# Patient Record
Sex: Male | Born: 1950
Health system: Southern US, Community
[De-identification: ages and names within clinical notes are randomized; demographics above are authoritative.]

## PROBLEM LIST (undated history)

## (undated) DIAGNOSIS — K5790 Diverticulosis of intestine, part unspecified, without perforation or abscess without bleeding: Secondary | ICD-10-CM

## (undated) DIAGNOSIS — I219 Acute myocardial infarction, unspecified: Secondary | ICD-10-CM

## (undated) DIAGNOSIS — N529 Male erectile dysfunction, unspecified: Secondary | ICD-10-CM

## (undated) DIAGNOSIS — R197 Diarrhea, unspecified: Secondary | ICD-10-CM

## (undated) DIAGNOSIS — D126 Benign neoplasm of colon, unspecified: Secondary | ICD-10-CM

## (undated) DIAGNOSIS — I2510 Atherosclerotic heart disease of native coronary artery without angina pectoris: Secondary | ICD-10-CM

## (undated) DIAGNOSIS — E119 Type 2 diabetes mellitus without complications: Secondary | ICD-10-CM

## (undated) DIAGNOSIS — I10 Essential (primary) hypertension: Secondary | ICD-10-CM

## (undated) DIAGNOSIS — E785 Hyperlipidemia, unspecified: Secondary | ICD-10-CM

## (undated) DIAGNOSIS — H269 Unspecified cataract: Secondary | ICD-10-CM

## (undated) DIAGNOSIS — K8020 Calculus of gallbladder without cholecystitis without obstruction: Secondary | ICD-10-CM

## (undated) HISTORY — DX: Diarrhea, unspecified: R19.7

## (undated) HISTORY — DX: Calculus of gallbladder without cholecystitis without obstruction: K80.20

## (undated) HISTORY — DX: Hyperlipidemia, unspecified: E78.5

## (undated) HISTORY — DX: Male erectile dysfunction, unspecified: N52.9

## (undated) HISTORY — PX: CORONARY ANGIOPLASTY WITH STENT PLACEMENT: SHX49

## (undated) HISTORY — DX: Acute myocardial infarction, unspecified: I21.9

## (undated) HISTORY — DX: Atherosclerotic heart disease of native coronary artery without angina pectoris: I25.10

## (undated) HISTORY — DX: Unspecified cataract: H26.9

## (undated) HISTORY — DX: Essential (primary) hypertension: I10

## (undated) HISTORY — DX: Benign neoplasm of colon, unspecified: D12.6

## (undated) HISTORY — DX: Diverticulosis of intestine, part unspecified, without perforation or abscess without bleeding: K57.90

---

## 2000-06-28 HISTORY — PX: ANGIOPLASTY: SHX39

## 2002-10-03 ENCOUNTER — Ambulatory Visit (HOSPITAL_BASED_OUTPATIENT_CLINIC_OR_DEPARTMENT_OTHER): Admission: RE | Admit: 2002-10-03 | Discharge: 2002-10-03 | Payer: Self-pay | Admitting: *Deleted

## 2007-06-29 HISTORY — PX: ROTATOR CUFF REPAIR: SHX139

## 2010-11-13 NOTE — Op Note (Signed)
   NAMEBALDWIN, RACICOT                          ACCOUNT NO.:  [id number]   MEDICAL RECORD NO.:  [id number]                   PATIENT TYPE:  AMB   LOCATION:  DSC                                  FACILITY:  [facility]   PHYSICIAN:  Lowell Bouton, M.D.      DATE OF BIRTH:  01/24/1951   DATE OF PROCEDURE:  10/03/2002  DATE OF DISCHARGE:                                 OPERATIVE REPORT   PREOPERATIVE DIAGNOSIS:  Trigger finger, right middle finger.   POSTOPERATIVE DIAGNOSIS:  Trigger finger, right middle finger.   OPERATION:  Release of A1 pulley right middle finger.   SURGEON:  Lowell Bouton, M.D.   ANESTHESIA:  0.5% Marcaine local with sedation.   OPERATIVE FINDINGS:  The patient had some nodular formation in the flexor  tendon beneath the A1 pulley.   DESCRIPTION OF PROCEDURE:  Under 0.5% Marcaine local anesthesia with a  tourniquet on the right arm, the right hand was prepped and draped in the  usual fashion and after exsanguinating the limb, the tourniquet was inflated  to 250 mmHg.  An oblique incision was made in line with the distal palmar  crease over the volar aspect of the MB joint of the middle finger.  Sharp  dissection was carried through the subcutaneous tissues.  Blunt dissection  was carried down to the flexor tendon sheath and care was taken to protect  the digital nerves.  In the midline a longitudinal incision was made through  the A1 pulley with a knife.  The remainder of the pulley was divided with  the scissors.  The finger was placed through a full range of motion and no  further triggering was encountered.  The wound was irrigated with saline.  The skin was closed with 4-0 Nylon sutures.  Sterile dressings were applied  and the tourniquet was released with good circulation to the hand.  The  patient went to the recovery room awake and stable in good condition.                                               Lowell Bouton,  M.D.    EMM/MEDQ  D:  10/03/2002  T:  10/04/2002  Job:  [id number]

## 2011-06-29 HISTORY — PX: TRIGGER FINGER RELEASE: SHX641

## 2012-06-28 HISTORY — PX: ROTATOR CUFF REPAIR: SHX139

## 2012-09-19 ENCOUNTER — Ambulatory Visit: Payer: BC Managed Care – PPO | Attending: Orthopaedic Surgery | Admitting: Rehabilitation

## 2012-09-19 DIAGNOSIS — M25519 Pain in unspecified shoulder: Secondary | ICD-10-CM | POA: Insufficient documentation

## 2012-09-19 DIAGNOSIS — IMO0001 Reserved for inherently not codable concepts without codable children: Secondary | ICD-10-CM | POA: Insufficient documentation

## 2012-09-26 ENCOUNTER — Ambulatory Visit: Payer: BC Managed Care – PPO | Attending: Orthopaedic Surgery | Admitting: Rehabilitation

## 2012-09-26 DIAGNOSIS — M25519 Pain in unspecified shoulder: Secondary | ICD-10-CM | POA: Insufficient documentation

## 2012-09-26 DIAGNOSIS — IMO0001 Reserved for inherently not codable concepts without codable children: Secondary | ICD-10-CM | POA: Insufficient documentation

## 2012-09-28 ENCOUNTER — Ambulatory Visit: Payer: BC Managed Care – PPO | Admitting: Rehabilitation

## 2012-10-03 ENCOUNTER — Ambulatory Visit: Payer: BC Managed Care – PPO | Admitting: Rehabilitation

## 2012-10-05 ENCOUNTER — Ambulatory Visit: Payer: BC Managed Care – PPO | Admitting: Rehabilitation

## 2012-10-10 ENCOUNTER — Ambulatory Visit: Payer: BC Managed Care – PPO | Admitting: Rehabilitation

## 2012-10-12 ENCOUNTER — Ambulatory Visit: Payer: BC Managed Care – PPO | Admitting: Rehabilitation

## 2012-10-17 ENCOUNTER — Ambulatory Visit: Payer: BC Managed Care – PPO | Admitting: Rehabilitation

## 2012-10-19 ENCOUNTER — Encounter: Payer: BC Managed Care – PPO | Admitting: Rehabilitation

## 2012-10-24 ENCOUNTER — Encounter: Payer: BC Managed Care – PPO | Admitting: Rehabilitation

## 2012-10-25 ENCOUNTER — Encounter: Payer: BC Managed Care – PPO | Admitting: Rehabilitation

## 2012-10-26 ENCOUNTER — Encounter: Payer: BC Managed Care – PPO | Admitting: Rehabilitation

## 2013-06-25 ENCOUNTER — Encounter: Payer: Self-pay | Admitting: Gastroenterology

## 2013-08-02 ENCOUNTER — Ambulatory Visit (AMBULATORY_SURGERY_CENTER): Payer: Self-pay | Admitting: *Deleted

## 2013-08-02 VITALS — Ht 64.0 in | Wt 177.4 lb

## 2013-08-02 DIAGNOSIS — Z1211 Encounter for screening for malignant neoplasm of colon: Secondary | ICD-10-CM

## 2013-08-02 MED ORDER — NA SULFATE-K SULFATE-MG SULF 17.5-3.13-1.6 GM/177ML PO SOLN: 1.0000 | Freq: Once | ORAL | Status: DC

## 2013-08-02 NOTE — Progress Notes (Signed)
No allergies to eggs or soy. No problems with anesthesia.  

## 2013-08-03 ENCOUNTER — Encounter: Payer: Self-pay | Admitting: Gastroenterology

## 2013-08-16 ENCOUNTER — Encounter: Payer: BC Managed Care – PPO | Admitting: Gastroenterology

## 2013-08-26 HISTORY — PX: COLONOSCOPY W/ POLYPECTOMY: SHX1380

## 2013-08-31 ENCOUNTER — Ambulatory Visit (AMBULATORY_SURGERY_CENTER): Payer: 59 | Admitting: Gastroenterology

## 2013-08-31 ENCOUNTER — Encounter: Payer: Self-pay | Admitting: Gastroenterology

## 2013-08-31 VITALS — BP 127/71 | HR 79 | Temp 98.0°F | Resp 16 | Ht 64.0 in | Wt 177.0 lb

## 2013-08-31 DIAGNOSIS — D126 Benign neoplasm of colon, unspecified: Secondary | ICD-10-CM

## 2013-08-31 DIAGNOSIS — K5730 Diverticulosis of large intestine without perforation or abscess without bleeding: Secondary | ICD-10-CM

## 2013-08-31 DIAGNOSIS — K648 Other hemorrhoids: Secondary | ICD-10-CM

## 2013-08-31 DIAGNOSIS — Z1211 Encounter for screening for malignant neoplasm of colon: Secondary | ICD-10-CM

## 2013-08-31 MED ORDER — SODIUM CHLORIDE 0.9 % IV SOLN: 500.0000 mL | INTRAVENOUS | Status: DC

## 2013-08-31 NOTE — Op Note (Signed)
[place]  [address] [place], [address]   COLONOSCOPY PROCEDURE REPORT  PATIENT: Troy Boyer, Troy Boyer  MR#: [id number] BIRTHDATE: 1950/07/27 , 62  yrs. old GENDER: Male ENDOSCOPIST: Inda Castle, MD REFERRED BY: PROCEDURE DATE:  08/31/2013 PROCEDURE:   Colonoscopy with snare polypectomy First Screening Colonoscopy - Avg.  risk and is 50 yrs.  old or older Yes.  Prior Negative Screening - Now for repeat screening. N/A  History of Adenoma - Now for follow-up colonoscopy & has been > or = to 3 yrs.  N/A  Polyps Removed Today? Yes. ASA CLASS:   Class II INDICATIONS:average risk screening. MEDICATIONS: MAC sedation, administered by CRNA and propofol (Diprivan) 250mg  IV  DESCRIPTION OF PROCEDURE:   After the risks benefits and alternatives of the procedure were thoroughly explained, informed consent was obtained.  A digital rectal exam revealed no abnormalities of the rectum.   The LB [id number]  endoscope was introduced through the anus and advanced to the cecum, which was identified by both the appendix and ileocecal valve. No adverse events experienced.   The quality of the prep was excellent using Suprep  The instrument was then slowly withdrawn as the colon was fully examined.      COLON FINDINGS: A sessile polyp measuring 5 mm in size was found in the ascending colon.  A polypectomy was performed with a cold snare.  The resection was complete and the polyp tissue was completely retrieved.   Moderate diverticulosis was noted in the sigmoid colon.   Mild diverticulosis was noted in the descending colon.   Internal hemorrhoids were found.  Retroflexed views revealed no abnormalities. The time to cecum=2 minutes 43 seconds. Withdrawal time=8 minutes 10 seconds.  The scope was withdrawn and the procedure completed. COMPLICATIONS: There were no complications.  ENDOSCOPIC IMPRESSION: 1.   Sessile polyp measuring 5 mm in size was found in the  ascending colon; polypectomy was performed with a cold snare 2.   Moderate diverticulosis was noted in the sigmoid colon 3.   Mild diverticulosis was noted in the descending colon 4.   Internal hemorrhoids  RECOMMENDATIONS: If the polyp(s) removed today are proven to be adenomatous (pre-cancerous) polyps, you will need a repeat colonoscopy in 5 years.  Otherwise you should continue to follow colorectal cancer screening guidelines for "routine risk" patients with colonoscopy in 10 years.  You will receive a letter within 1-2 weeks with the results of your biopsy as well as final recommendations.  Please call my office if you have not received a letter after 3 weeks.   eSigned:  Inda Castle, MD 08/31/2013 1:57 PM   cc:   PATIENT NAME:  Troy Boyer, Troy Boyer MR#: [id number]

## 2013-08-31 NOTE — Progress Notes (Signed)
Patient denies any allergies to eggs or soy. Patient denies any problems with anesthesia.  

## 2013-08-31 NOTE — Progress Notes (Signed)
Report to pacu rn, vss, bbs=clear 

## 2013-08-31 NOTE — Patient Instructions (Signed)
YOU HAD AN ENDOSCOPIC PROCEDURE TODAY AT THE [facility]: Refer to the procedure report that was given to you for any specific questions about what was found during the examination.  If the procedure report does not answer your questions, please call your gastroenterologist to clarify.  If you requested that your care partner not be given the details of your procedure findings, then the procedure report has been included in a sealed envelope for you to review at your convenience later.  YOU SHOULD EXPECT: Some feelings of bloating in the abdomen. Passage of more gas than usual.  Walking can help get rid of the air that was put into your GI tract during the procedure and reduce the bloating. If you had a lower endoscopy (such as a colonoscopy or flexible sigmoidoscopy) you may notice spotting of blood in your stool or on the toilet paper. If you underwent a bowel prep for your procedure, then you may not have a normal bowel movement for a few days.  DIET: Your first meal following the procedure should be a light meal and then it is ok to progress to your normal diet.  A half-sandwich or bowl of soup is an example of a good first meal.  Heavy or fried foods are harder to digest and may make you feel nauseous or bloated.  Likewise meals heavy in dairy and vegetables can cause extra gas to form and this can also increase the bloating.  Drink plenty of fluids but you should avoid alcoholic beverages for 24 hours.  ACTIVITY: Your care partner should take you home directly after the procedure.  You should plan to take it easy, moving slowly for the rest of the day.  You can resume normal activity the day after the procedure however you should NOT DRIVE or use heavy machinery for 24 hours (because of the sedation medicines used during the test).    SYMPTOMS TO REPORT IMMEDIATELY: A gastroenterologist can be reached at any hour.  During normal business hours, 8:30 AM to 5:00 PM Monday through Friday,  call [phone].  After hours and on weekends, please call the GI answering service at [phone] who will take a message and have the physician on call contact you.   Following lower endoscopy (colonoscopy or flexible sigmoidoscopy):  Excessive amounts of blood in the stool  Significant tenderness or worsening of abdominal pains  Swelling of the abdomen that is new, acute  Fever of 100F or higher  FOLLOW UP: If any biopsies were taken you will be contacted by phone or by letter within the next 1-3 weeks.  Call your gastroenterologist if you have not heard about the biopsies in 3 weeks.  Our staff will call the home number listed on your records the next business day following your procedure to check on you and address any questions or concerns that you may have at that time regarding the information given to you following your procedure. This is a courtesy call and so if there is no answer at the home number and we have not heard from you through the emergency physician on call, we will assume that you have returned to your regular daily activities without incident.  SIGNATURES/CONFIDENTIALITY: You and/or your care partner have signed paperwork which will be entered into your electronic medical record.  These signatures attest to the fact that that the information above on your After Visit Summary has been reviewed and is understood.  Full responsibility of the confidentiality of this   discharge information lies with you and/or your care-partner.  Recommendations See procedure report  

## 2013-08-31 NOTE — Progress Notes (Signed)
Called to room to assist during endoscopic procedure.  Patient ID and intended procedure confirmed with present staff. Received instructions for my participation in the procedure from the performing physician.  

## 2013-09-03 ENCOUNTER — Telehealth: Payer: Self-pay

## 2013-09-03 NOTE — Telephone Encounter (Signed)
  Follow up Call-  Call back number 08/31/2013  Post procedure Call Back phone  # [phone]  Permission to leave phone message Yes     Patient questions:  Do you have a fever, pain , or abdominal swelling? no Pain Score  0 *  Have you tolerated food without any problems? yes  Have you been able to return to your normal activities? yes  Do you have any questions about your discharge instructions: Diet   no Medications  no Follow up visit  no  Do you have questions or concerns about your Care? no  Actions: * If pain score is 4 or above: No action needed, pain <4.  Per the pt he said he did see a trace of bright, red blood in the toilet bowel Sunday evening.  I advised him if this does not stop on it's own within a couple of days to please call us back.  It it does stop no need to return the call.  Pt states he understands. Maw

## 2013-09-07 ENCOUNTER — Encounter: Payer: Self-pay | Admitting: Gastroenterology

## 2013-09-11 ENCOUNTER — Encounter: Payer: Self-pay | Admitting: *Deleted

## 2013-10-16 ENCOUNTER — Encounter: Payer: Self-pay | Admitting: Cardiology

## 2013-10-16 DIAGNOSIS — I2510 Atherosclerotic heart disease of native coronary artery without angina pectoris: Secondary | ICD-10-CM | POA: Insufficient documentation

## 2013-10-16 DIAGNOSIS — E782 Mixed hyperlipidemia: Secondary | ICD-10-CM | POA: Insufficient documentation

## 2013-10-16 DIAGNOSIS — I10 Essential (primary) hypertension: Secondary | ICD-10-CM | POA: Insufficient documentation

## 2013-10-16 DIAGNOSIS — E785 Hyperlipidemia, unspecified: Secondary | ICD-10-CM

## 2013-10-17 ENCOUNTER — Ambulatory Visit (INDEPENDENT_AMBULATORY_CARE_PROVIDER_SITE_OTHER): Payer: 59 | Admitting: Cardiology

## 2013-10-17 ENCOUNTER — Encounter: Payer: Self-pay | Admitting: Cardiology

## 2013-10-17 VITALS — BP 130/78 | HR 82 | Ht 64.0 in | Wt 175.0 lb

## 2013-10-17 DIAGNOSIS — I10 Essential (primary) hypertension: Secondary | ICD-10-CM

## 2013-10-17 DIAGNOSIS — I2510 Atherosclerotic heart disease of native coronary artery without angina pectoris: Secondary | ICD-10-CM

## 2013-10-17 DIAGNOSIS — Z720 Tobacco use: Secondary | ICD-10-CM | POA: Insufficient documentation

## 2013-10-17 DIAGNOSIS — F17200 Nicotine dependence, unspecified, uncomplicated: Secondary | ICD-10-CM

## 2013-10-17 DIAGNOSIS — E785 Hyperlipidemia, unspecified: Secondary | ICD-10-CM

## 2013-10-17 LAB — BASIC METABOLIC PANEL WITH GFR
BUN: 12 mg/dL (ref 6–23)
CO2: 24 meq/L (ref 19–32)
CREATININE: 0.88 mg/dL (ref 0.50–1.35)
Calcium: 9.4 mg/dL (ref 8.4–10.5)
Chloride: 103 mEq/L (ref 96–112)
GFR, Est African American: >89 mL/min
GFR, Est Non African American: >89 mL/min
GLUCOSE: 114 mg/dL — AB (ref 70–99)
Potassium: 4.4 mEq/L (ref 3.5–5.3)
Sodium: 137 mEq/L (ref 135–145)

## 2013-10-17 LAB — LIPID PANEL
CHOLESTEROL: 167 mg/dL (ref 0–200)
HDL: 34 mg/dL — ABNORMAL LOW (ref >39)
LDL CALC: 76 mg/dL (ref 0–99)
Total CHOL/HDL Ratio: 4.9 Ratio
Triglycerides: 284 mg/dL — ABNORMAL HIGH (ref <150)
VLDL: 57 mg/dL — ABNORMAL HIGH (ref 0–40)

## 2013-10-17 LAB — HEPATIC FUNCTION PANEL
ALK PHOS: 37 U/L — AB (ref 39–117)
ALT: 25 U/L (ref 0–53)
AST: 21 U/L (ref 0–37)
Albumin: 4.4 g/dL (ref 3.5–5.2)
BILIRUBIN DIRECT: 0.1 mg/dL (ref 0.0–0.3)
BILIRUBIN INDIRECT: 0.4 mg/dL (ref 0.2–1.2)
Total Bilirubin: 0.5 mg/dL (ref 0.2–1.2)
Total Protein: 6.6 g/dL (ref 6.0–8.3)

## 2013-10-17 NOTE — Assessment & Plan Note (Signed)
Patient counseled on discontinuing. 

## 2013-10-17 NOTE — Progress Notes (Signed)
HPI: 63 year old male for evaluation of coronary artery disease. Previously followed in [facility]. Patient had a non-ST elevation myocardial infarction in 2002. Cardiac catheterization revealed an 80% proximal right coronary artery followed by an 85%. There was a 90% circumflex. LV function normal. Patient had PCI of his circumflex and right coronary artery. Nuclear study in July of 2012 showed an ejection fraction of 76%. No ischemia. Patient denies dyspnea, chest pain, palpitations, syncope or claudication.  Current Outpatient Prescriptions  Medication Sig Dispense Refill  . amLODipine (NORVASC) 5 MG tablet Take 5 mg by mouth daily.      Marland Kitchen aspirin 81 MG tablet Take 81 mg by mouth daily.      Marland Kitchen atorvastatin (LIPITOR) 40 MG tablet Take 40 mg by mouth daily.      . fenofibrate 160 MG tablet Take 160 mg by mouth daily.      . metoprolol tartrate (LOPRESSOR) 25 MG tablet Take 25 mg by mouth daily.      . Multiple Vitamin (MULTIVITAMIN) tablet Take 1 tablet by mouth daily.      . NON FORMULARY MEGARED  1 TAB DAILY      . ramipril (ALTACE) 10 MG capsule Take 10 mg by mouth daily.      . Sildenafil Citrate (VIAGRA PO) Take by mouth as needed.       No current facility-administered medications for this visit.    No Known Allergies  Past Medical History  Diagnosis Date  . Hyperlipidemia   . Hypertension   . CAD (coronary artery disease)     Past Surgical History  Procedure Laterality Date  . Angioplasty  2002    2 stents  . Rotator cuff repair Right 2009  . Trigger finger release  2013    4 fingers  . Rotator cuff repair Left 2014    History   Social History  . Marital Status: Significant Other    Spouse Name: N/A    Number of Children: 4  . Years of Education: N/A   Occupational History  .     Social History Main Topics  . Smoking status: Current Every Day Smoker -- 1.00 packs/day    Types: Cigarettes  . Smokeless tobacco: Never Used  . Alcohol Use: 4.2 oz/week      7 Glasses of wine per week  . Drug Use: No  . Sexual Activity: Not on file   Other Topics Concern  . Not on file   Social History Narrative  . No narrative on file    Family History  Problem Relation Age of Onset  . Colon cancer Neg Hx   . CAD Brother   . CAD Sister   . Heart disease Father     Valve replacement and CAD    ROS: no fevers or chills, productive cough, hemoptysis, dysphasia, odynophagia, melena, hematochezia, dysuria, hematuria, rash, seizure activity, orthopnea, PND, pedal edema, claudication. Remaining systems are negative.  Physical Exam:   Blood pressure 130/78, pulse 82, height 5\' 4"  (1.626 m), weight 175 lb (79.379 kg).  General:  Well developed/well nourished in NAD Skin warm/dry Patient not depressed No peripheral clubbing Back-normal HEENT-normal/normal eyelids Neck supple/normal carotid upstroke bilaterally; no bruits; no JVD; no thyromegaly chest - CTA/ normal expansion CV - RRR/normal S1 and S2; no murmurs, rubs or gallops;  PMI nondisplaced Abdomen -NT/ND, no HSM, no mass, + bowel sounds, no bruit 2+ femoral pulses, no bruits Ext-no edema, chords, 2+ DP Neuro-grossly nonfocal  ECG  Normal sinus rhythm, left axis deviation.

## 2013-10-17 NOTE — Assessment & Plan Note (Signed)
Continue statin. Check lipids and liver. I will arrange for him to have a primary care physician for noncardiac issues.

## 2013-10-17 NOTE — Assessment & Plan Note (Signed)
Blood pressure controlled. Continue present medications. Check potassium and renal function. 

## 2013-10-17 NOTE — Assessment & Plan Note (Signed)
Continue aspirin and statin. 

## 2013-10-17 NOTE — Patient Instructions (Signed)
Your physician wants you to follow-up in: ONE YEAR WITH DR CRENSHAW You will receive a reminder letter in the mail two months in advance. If you don't receive a letter, please call our office to schedule the follow-up appointment.   Your physician recommends that you HAVE LAB WORK TODAY 

## 2013-12-24 ENCOUNTER — Other Ambulatory Visit: Payer: Self-pay

## 2013-12-24 MED ORDER — ATORVASTATIN CALCIUM 40 MG PO TABS: 40.0000 mg | ORAL_TABLET | Freq: Every day | ORAL | Status: DC

## 2014-01-30 ENCOUNTER — Other Ambulatory Visit: Payer: Self-pay | Admitting: *Deleted

## 2014-01-30 MED ORDER — RAMIPRIL 10 MG PO CAPS: 10.0000 mg | ORAL_CAPSULE | Freq: Every day | ORAL | Status: DC

## 2014-01-30 MED ORDER — AMLODIPINE BESYLATE 5 MG PO TABS: 5.0000 mg | ORAL_TABLET | Freq: Every day | ORAL | Status: DC

## 2014-01-30 MED ORDER — METOPROLOL TARTRATE 25 MG PO TABS: 25.0000 mg | ORAL_TABLET | Freq: Every day | ORAL | Status: DC

## 2014-02-05 ENCOUNTER — Other Ambulatory Visit: Payer: Self-pay | Admitting: Cardiology

## 2014-02-06 ENCOUNTER — Other Ambulatory Visit: Payer: Self-pay | Admitting: *Deleted

## 2014-02-06 NOTE — Telephone Encounter (Signed)
Spoke to patient and he stated that he has been taking this bid for at least 7 years and that it was probably put into our system incorrectly when he was seen.

## 2014-02-06 NOTE — Telephone Encounter (Signed)
Should this be qd(which is on the chart), or bid(which is what was requested by pharmacy)? Please advise. Thanks, MI

## 2014-02-06 NOTE — Telephone Encounter (Signed)
Looks like it should be 10 mg once daily but would have to call the patient to discuss and confirm.

## 2014-03-22 ENCOUNTER — Other Ambulatory Visit: Payer: Self-pay | Admitting: Cardiology

## 2014-03-25 ENCOUNTER — Other Ambulatory Visit: Payer: Self-pay | Admitting: Cardiology

## 2014-07-30 ENCOUNTER — Other Ambulatory Visit: Payer: Self-pay | Admitting: Cardiology

## 2014-07-31 ENCOUNTER — Other Ambulatory Visit: Payer: Self-pay | Admitting: Cardiology

## 2014-07-31 NOTE — Telephone Encounter (Signed)
Rx has been sent to the pharmacy electronically. ° °

## 2014-08-12 ENCOUNTER — Other Ambulatory Visit: Payer: Self-pay | Admitting: Cardiology

## 2014-08-14 ENCOUNTER — Other Ambulatory Visit: Payer: Self-pay | Admitting: Cardiology

## 2014-08-14 NOTE — Telephone Encounter (Signed)
Rx has been sent to the pharmacy electronically. ° °

## 2014-08-15 ENCOUNTER — Other Ambulatory Visit: Payer: Self-pay | Admitting: *Deleted

## 2014-08-15 ENCOUNTER — Other Ambulatory Visit: Payer: Self-pay

## 2014-08-15 MED ORDER — METOPROLOL TARTRATE 25 MG PO TABS: 25.0000 mg | ORAL_TABLET | Freq: Every day | ORAL | Status: DC

## 2014-08-15 MED ORDER — RAMIPRIL 10 MG PO CAPS
10.0000 mg | ORAL_CAPSULE | Freq: Two times a day (BID) | ORAL | Status: DC
Start: 2014-08-15 — End: 2015-03-11

## 2014-09-16 NOTE — Progress Notes (Signed)
      HPI: FU coronary artery disease. Previously followed in [facility]. Patient had a non-ST elevation myocardial infarction in 2002. Cardiac catheterization revealed an 80% proximal right coronary artery followed by an 85%. There was a 90% circumflex. LV function normal. Patient had PCI of his circumflex and right coronary artery. Nuclear study in July of 2012 showed an ejection fraction of 76%. No ischemia. Since last seen, the patient denies any dyspnea on exertion, orthopnea, PND, pedal edema, palpitations, syncope or chest pain.   Current Outpatient Prescriptions  Medication Sig Dispense Refill  . amLODipine (NORVASC) 5 MG tablet TAKE 1 TABLET BY MOUTH DAILY 90 tablet 0  . aspirin 81 MG tablet Take 81 mg by mouth daily.    Marland Kitchen atorvastatin (LIPITOR) 40 MG tablet Take 1 tablet (40 mg total) by mouth daily. 90 tablet 3  . fenofibrate 160 MG tablet TAKE 1 TABLET BY MOUTH EVERY DAY 90 tablet 1  . KRILL OIL PO Take by mouth daily.    . metoprolol tartrate (LOPRESSOR) 25 MG tablet Take 1 tablet (25 mg total) by mouth daily. 90 tablet 0  . Multiple Vitamin (MULTIVITAMIN) tablet Take 1 tablet by mouth daily.    . nitroGLYCERIN (NITROSTAT) 0.4 MG SL tablet Place 0.4 mg under the tongue every 5 (five) minutes as needed for chest pain.    . ramipril (ALTACE) 10 MG capsule Take 1 capsule (10 mg total) by mouth 2 (two) times daily. 60 capsule 3  . Sildenafil Citrate (VIAGRA PO) Take by mouth as needed.     No current facility-administered medications for this visit.     Past Medical History  Diagnosis Date  . Hyperlipidemia   . Hypertension   . CAD (coronary artery disease)     Past Surgical History  Procedure Laterality Date  . Angioplasty  2002    2 stents  . Rotator cuff repair Right 2009  . Trigger finger release  2013    4 fingers  . Rotator cuff repair Left 2014    History   Social History  . Marital Status: Significant Other    Spouse Name: N/A  . Number of Children: 4    . Years of Education: N/A   Occupational History  .     Social History Main Topics  . Smoking status: Current Every Day Smoker -- 1.00 packs/day    Types: Cigarettes  . Smokeless tobacco: Never Used  . Alcohol Use: 4.2 oz/week    7 Glasses of wine per week  . Drug Use: No  . Sexual Activity: Not on file   Other Topics Concern  . Not on file   Social History Narrative    ROS: no fevers or chills, productive cough, hemoptysis, dysphasia, odynophagia, melena, hematochezia, dysuria, hematuria, rash, seizure activity, orthopnea, PND, pedal edema, claudication. Remaining systems are negative.  Physical Exam: Well-developed well-nourished in no acute distress.  Skin is warm and dry.  HEENT is normal.  Neck is supple.  Chest is clear to auscultation with normal expansion.  Cardiovascular exam is regular rate and rhythm.  Abdominal exam nontender or distended. No masses palpated. Extremities show no edema. neuro grossly intact  ECG sinus rhythm at a rate of 79. Left axis deviation.

## 2014-09-18 ENCOUNTER — Ambulatory Visit (INDEPENDENT_AMBULATORY_CARE_PROVIDER_SITE_OTHER): Payer: PRIVATE HEALTH INSURANCE | Admitting: Cardiology

## 2014-09-18 ENCOUNTER — Encounter: Payer: Self-pay | Admitting: Cardiology

## 2014-09-18 VITALS — BP 124/82 | HR 79 | Ht 64.0 in | Wt 175.1 lb

## 2014-09-18 DIAGNOSIS — I2510 Atherosclerotic heart disease of native coronary artery without angina pectoris: Secondary | ICD-10-CM | POA: Diagnosis not present

## 2014-09-18 DIAGNOSIS — I10 Essential (primary) hypertension: Secondary | ICD-10-CM

## 2014-09-18 DIAGNOSIS — Z720 Tobacco use: Secondary | ICD-10-CM | POA: Diagnosis not present

## 2014-09-18 DIAGNOSIS — E785 Hyperlipidemia, unspecified: Secondary | ICD-10-CM | POA: Diagnosis not present

## 2014-09-18 LAB — HEPATIC FUNCTION PANEL
ALK PHOS: 33 U/L — AB (ref 39–117)
ALT: 23 U/L (ref 0–53)
AST: 22 U/L (ref 0–37)
Albumin: 4.5 g/dL (ref 3.5–5.2)
BILIRUBIN TOTAL: 0.6 mg/dL (ref 0.2–1.2)
Bilirubin, Direct: 0.1 mg/dL (ref 0.0–0.3)
Indirect Bilirubin: 0.5 mg/dL (ref 0.2–1.2)
Total Protein: 6.6 g/dL (ref 6.0–8.3)

## 2014-09-18 LAB — BASIC METABOLIC PANEL WITH GFR
BUN: 13 mg/dL (ref 6–23)
CO2: 22 meq/L (ref 19–32)
Calcium: 10 mg/dL (ref 8.4–10.5)
Chloride: 105 mEq/L (ref 96–112)
Creat: 0.9 mg/dL (ref 0.50–1.35)
GFR, Est African American: >89 mL/min
GFR, Est Non African American: >89 mL/min
GLUCOSE: 122 mg/dL — AB (ref 70–99)
POTASSIUM: 4.3 meq/L (ref 3.5–5.3)
Sodium: 141 mEq/L (ref 135–145)

## 2014-09-18 LAB — LIPID PANEL
Cholesterol: 168 mg/dL (ref 0–200)
HDL: 32 mg/dL — ABNORMAL LOW (ref >=40)
LDL Cholesterol: 64 mg/dL (ref 0–99)
TRIGLYCERIDES: 362 mg/dL — AB (ref <150)
Total CHOL/HDL Ratio: 5.3 Ratio
VLDL: 72 mg/dL — ABNORMAL HIGH (ref 0–40)

## 2014-09-18 MED ORDER — NITROGLYCERIN 0.4 MG SL SUBL: 0.4000 mg | SUBLINGUAL_TABLET | SUBLINGUAL | Status: DC | PRN

## 2014-09-18 NOTE — Patient Instructions (Signed)
Your physician wants you to follow-up in: ONE YEAR WITH DR CRENSHAW You will receive a reminder letter in the mail two months in advance. If you don't receive a letter, please call our office to schedule the follow-up appointment.   Your physician recommends that you HAVE LAB WORK TODAY 

## 2014-09-18 NOTE — Assessment & Plan Note (Signed)
Patient counseled on discontinuing. 

## 2014-09-18 NOTE — Assessment & Plan Note (Signed)
Continue statin. Check lipids and liver. 

## 2014-09-18 NOTE — Assessment & Plan Note (Signed)
Continue aspirin and statin. I have recommended a functional study. He would like to wait towards the end of the year when his deductible is met.

## 2014-09-18 NOTE — Assessment & Plan Note (Signed)
Blood pressure controlled. Continue present medications. 

## 2014-10-17 ENCOUNTER — Other Ambulatory Visit: Payer: Self-pay | Admitting: Cardiology

## 2014-10-21 ENCOUNTER — Encounter: Payer: Self-pay | Admitting: Family

## 2014-10-21 ENCOUNTER — Other Ambulatory Visit: Payer: Self-pay | Admitting: Family

## 2014-10-21 ENCOUNTER — Ambulatory Visit (INDEPENDENT_AMBULATORY_CARE_PROVIDER_SITE_OTHER): Payer: PRIVATE HEALTH INSURANCE | Admitting: Family

## 2014-10-21 ENCOUNTER — Other Ambulatory Visit (INDEPENDENT_AMBULATORY_CARE_PROVIDER_SITE_OTHER): Payer: PRIVATE HEALTH INSURANCE

## 2014-10-21 VITALS — BP 138/92 | HR 72 | Temp 98.0°F | Resp 18 | Ht 64.0 in | Wt 172.8 lb

## 2014-10-21 DIAGNOSIS — R7301 Impaired fasting glucose: Secondary | ICD-10-CM | POA: Diagnosis not present

## 2014-10-21 DIAGNOSIS — H938X9 Other specified disorders of ear, unspecified ear: Secondary | ICD-10-CM | POA: Insufficient documentation

## 2014-10-21 DIAGNOSIS — E119 Type 2 diabetes mellitus without complications: Secondary | ICD-10-CM | POA: Insufficient documentation

## 2014-10-21 DIAGNOSIS — E1165 Type 2 diabetes mellitus with hyperglycemia: Secondary | ICD-10-CM | POA: Insufficient documentation

## 2014-10-21 DIAGNOSIS — H938X2 Other specified disorders of left ear: Secondary | ICD-10-CM

## 2014-10-21 LAB — HEMOGLOBIN A1C: HEMOGLOBIN A1C: 6.7 % — AB (ref 4.6–6.5)

## 2014-10-21 MED ORDER — METFORMIN HCL 500 MG PO TABS: 500.0000 mg | ORAL_TABLET | Freq: Two times a day (BID) | ORAL | Status: DC

## 2014-10-21 NOTE — Progress Notes (Signed)
Pre visit review using our clinic review tool, if applicable. No additional management support is needed unless otherwise documented below in the visit note. 

## 2014-10-21 NOTE — Patient Instructions (Signed)
Thank you for choosing [facility].  Summary/Instructions:  Please stop by the lab on the basement level of the building for your blood work. Your results will be released to [facility] (or called to you) after review, usually within 72 hours after test completion. If any changes need to be made, you will be notified at that same time.  For your ear - please start taking an antihistamine (claritin, allegra, zyrtec) and possible add flonase or nasacort as needed.   If your symptoms do not improve please let us know.

## 2014-10-21 NOTE — Assessment & Plan Note (Signed)
Symptoms of ear fullness most likely related to eustachian tube dysfunction. Treat conservatively with over-the-counter second-generation antihistamines and nasal corticosteroids as needed. If symptoms fail to improve consider treating for serous otitis media. Follow-up if symptoms worsen or fail to improve

## 2014-10-21 NOTE — Assessment & Plan Note (Signed)
Was noted to have elevated fasting blood sugar on previous evaluation. Obtain A1c to determine current status. Continue with dietary management at this time pending lab work.

## 2014-10-21 NOTE — Progress Notes (Signed)
Subjective:    Patient ID: Troy Boyer, male    DOB: 1950/07/25, 64 y.o.   MRN: [id number]  Chief Complaint  Patient presents with  . Establish Care    was told by cardiologist that his sugar was elevated    HPI:  Troy Boyer is a 64 y.o. male with a PMH of hypertension, CAD, and hyperlipidemia who presents today to establish care and discuss elevated blood pressure.   1) Increased blood sugar - Was recently seen by cardiology and informed that he had elevated blood sugar on a fasting blood test. Does experience the associated symptom of increased urination; denies polyphasia and polydipsia. Modifying factors include changes in diet which he has cut down on the added sugars and alcohol.  2) Ear stuffiness - Associated symptom of ear fullness located in his left ear has been going on for couple weeks. Notes he recently is recovering from a cold. He has tried over-the-counter cold medicines, which have not helped. There is no pain or discharge noted from the ear, simply describes fullness.   No Known Allergies   Current Outpatient Prescriptions on File Prior to Visit  Medication Sig Dispense Refill  . amLODipine (NORVASC) 5 MG tablet TAKE 1 TABLET BY MOUTH DAILY 90 tablet 0  . aspirin 81 MG tablet Take 81 mg by mouth daily.    Marland Kitchen atorvastatin (LIPITOR) 40 MG tablet TAKE 1 TABLET BY MOUTH DAILY 90 tablet 2  . fenofibrate 160 MG tablet TAKE 1 TABLET BY MOUTH EVERY DAY 90 tablet 1  . KRILL OIL PO Take by mouth daily.    . metoprolol tartrate (LOPRESSOR) 25 MG tablet Take 1 tablet (25 mg total) by mouth daily. 90 tablet 0  . Multiple Vitamin (MULTIVITAMIN) tablet Take 1 tablet by mouth daily.    . nitroGLYCERIN (NITROSTAT) 0.4 MG SL tablet Place 1 tablet (0.4 mg total) under the tongue every 5 (five) minutes as needed for chest pain. 25 tablet 3  . ramipril (ALTACE) 10 MG capsule Take 1 capsule (10 mg total) by mouth 2 (two) times daily. 60 capsule 3  . Sildenafil Citrate (VIAGRA  PO) Take by mouth as needed.     No current facility-administered medications on file prior to visit.    Past Medical History  Diagnosis Date  . Hyperlipidemia   . Hypertension   . CAD (coronary artery disease)   . Erectile dysfunction     Past Surgical History  Procedure Laterality Date  . Angioplasty  2002    2 stents  . Rotator cuff repair Right 2009  . Trigger finger release  2013    4 fingers  . Rotator cuff repair Left 2014    Family History  Problem Relation Age of Onset  . Colon cancer Neg Hx   . CAD Brother   . CAD Sister   . Heart disease Father     Valve replacement and CAD    History   Social History  . Marital Status: Significant Other    Spouse Name: N/A  . Number of Children: 2  . Years of Education: 18   Occupational History  . Sales    Social History Main Topics  . Smoking status: Current Every Day Smoker -- 1.00 packs/day for 44 years    Types: Cigarettes  . Smokeless tobacco: Never Used  . Alcohol Use: Yes     Comment: occasionally  . Drug Use: No  . Sexual Activity: Not on file   Other Topics Concern  .  Not on file   Social History Narrative   Fun: Boat, golf   Denies religious beliefs effecting health care.     Review of Systems  Eyes:       Negative for changes in vision  Respiratory: Negative for chest tightness and shortness of breath.   Cardiovascular: Negative for chest pain, palpitations and leg swelling.  Endocrine: Positive for polyuria. Negative for polydipsia and polyphagia.  Neurological: Negative for headaches.      Objective:    BP 138/92 mmHg  Pulse 72  Temp(Src) 98 F (36.7 C) (Oral)  Resp 18  Ht 5\' 4"  (1.626 m)  Wt 172 lb 12.8 oz (78.382 kg)  BMI 29.65 kg/m2  SpO2 97% Nursing note and vital signs reviewed.  Physical Exam  Constitutional: He is oriented to person, place, and time. He appears well-developed and well-nourished. No distress.  HENT:  Right Ear: Hearing, tympanic membrane, external ear  and ear canal normal.  Left Ear: Hearing, tympanic membrane, external ear and ear canal normal.  Nose: Nose normal.  Mouth/Throat: Uvula is midline, oropharynx is clear and moist and mucous membranes are normal.  Cardiovascular: Normal rate, regular rhythm, normal heart sounds and intact distal pulses.   Pulmonary/Chest: Effort normal and breath sounds normal.  Neurological: He is alert and oriented to person, place, and time.  Skin: Skin is warm and dry.  Psychiatric: He has a normal mood and affect. His behavior is normal. Judgment and thought content normal.       Assessment & Plan:

## 2014-10-25 ENCOUNTER — Encounter: Payer: Self-pay | Admitting: Family

## 2014-11-03 ENCOUNTER — Other Ambulatory Visit: Payer: Self-pay | Admitting: Cardiology

## 2014-11-12 ENCOUNTER — Other Ambulatory Visit: Payer: Self-pay | Admitting: Cardiology

## 2014-11-12 NOTE — Telephone Encounter (Signed)
Rx(s) sent to pharmacy electronically.  

## 2015-01-03 ENCOUNTER — Telehealth: Payer: Self-pay | Admitting: Family

## 2015-01-03 DIAGNOSIS — R7301 Impaired fasting glucose: Secondary | ICD-10-CM

## 2015-01-03 NOTE — Telephone Encounter (Signed)
Patient will need his A1c check in a couple weeks. Can he just come for lab work or will he need an appointment. Can he get the lab work done at the [facility] in [place].  Please advise patient.  Best number is [phone]

## 2015-01-03 NOTE — Telephone Encounter (Signed)
That is fine. I have placed the A1c and he can get it checked at [facility]. We will follow up once we receive the results.

## 2015-01-06 NOTE — Telephone Encounter (Signed)
Pt aware.

## 2015-01-19 ENCOUNTER — Inpatient Hospital Stay (HOSPITAL_BASED_OUTPATIENT_CLINIC_OR_DEPARTMENT_OTHER)
Admission: EM | Admit: 2015-01-19 | Discharge: 2015-01-21 | DRG: 246 | Disposition: A | Discharge status: Home / Self Care | Payer: PRIVATE HEALTH INSURANCE | Attending: Cardiovascular Disease | Admitting: Cardiovascular Disease

## 2015-01-19 ENCOUNTER — Encounter (HOSPITAL_BASED_OUTPATIENT_CLINIC_OR_DEPARTMENT_OTHER): Payer: Self-pay

## 2015-01-19 ENCOUNTER — Emergency Department (HOSPITAL_BASED_OUTPATIENT_CLINIC_OR_DEPARTMENT_OTHER): Payer: PRIVATE HEALTH INSURANCE

## 2015-01-19 DIAGNOSIS — I10 Essential (primary) hypertension: Secondary | ICD-10-CM | POA: Diagnosis present

## 2015-01-19 DIAGNOSIS — Z79899 Other long term (current) drug therapy: Secondary | ICD-10-CM

## 2015-01-19 DIAGNOSIS — Z006 Encounter for examination for normal comparison and control in clinical research program: Secondary | ICD-10-CM | POA: Diagnosis not present

## 2015-01-19 DIAGNOSIS — I2510 Atherosclerotic heart disease of native coronary artery without angina pectoris: Secondary | ICD-10-CM | POA: Diagnosis not present

## 2015-01-19 DIAGNOSIS — Z7982 Long term (current) use of aspirin: Secondary | ICD-10-CM | POA: Diagnosis not present

## 2015-01-19 DIAGNOSIS — E785 Hyperlipidemia, unspecified: Secondary | ICD-10-CM | POA: Diagnosis present

## 2015-01-19 DIAGNOSIS — T82857A Stenosis of cardiac prosthetic devices, implants and grafts, initial encounter: Principal | ICD-10-CM | POA: Diagnosis present

## 2015-01-19 DIAGNOSIS — R079 Chest pain, unspecified: Secondary | ICD-10-CM

## 2015-01-19 DIAGNOSIS — F17210 Nicotine dependence, cigarettes, uncomplicated: Secondary | ICD-10-CM | POA: Diagnosis present

## 2015-01-19 DIAGNOSIS — E119 Type 2 diabetes mellitus without complications: Secondary | ICD-10-CM | POA: Diagnosis present

## 2015-01-19 DIAGNOSIS — Z7902 Long term (current) use of antithrombotics/antiplatelets: Secondary | ICD-10-CM | POA: Diagnosis not present

## 2015-01-19 DIAGNOSIS — Z8249 Family history of ischemic heart disease and other diseases of the circulatory system: Secondary | ICD-10-CM

## 2015-01-19 DIAGNOSIS — Z720 Tobacco use: Secondary | ICD-10-CM | POA: Diagnosis present

## 2015-01-19 DIAGNOSIS — Z955 Presence of coronary angioplasty implant and graft: Secondary | ICD-10-CM

## 2015-01-19 DIAGNOSIS — E1165 Type 2 diabetes mellitus with hyperglycemia: Secondary | ICD-10-CM

## 2015-01-19 DIAGNOSIS — I214 Non-ST elevation (NSTEMI) myocardial infarction: Secondary | ICD-10-CM | POA: Diagnosis present

## 2015-01-19 DIAGNOSIS — E780 Pure hypercholesterolemia: Secondary | ICD-10-CM | POA: Diagnosis present

## 2015-01-19 DIAGNOSIS — R0789 Other chest pain: Secondary | ICD-10-CM | POA: Diagnosis present

## 2015-01-19 DIAGNOSIS — I25110 Atherosclerotic heart disease of native coronary artery with unstable angina pectoris: Secondary | ICD-10-CM | POA: Diagnosis present

## 2015-01-19 DIAGNOSIS — E782 Mixed hyperlipidemia: Secondary | ICD-10-CM | POA: Diagnosis present

## 2015-01-19 HISTORY — DX: Type 2 diabetes mellitus without complications: E11.9

## 2015-01-19 LAB — TROPONIN I
TROPONIN I: 0.32 ng/mL — AB (ref <0.031)
TROPONIN I: 0.59 ng/mL — AB (ref <0.031)

## 2015-01-19 LAB — COMPREHENSIVE METABOLIC PANEL
ALBUMIN: 4.9 g/dL (ref 3.5–5.0)
ALT: 24 U/L (ref 17–63)
ALT: 21 U/L (ref 17–63)
AST: 25 U/L (ref 15–41)
AST: 24 U/L (ref 15–41)
Albumin: 4.3 g/dL (ref 3.5–5.0)
Alkaline Phosphatase: 34 U/L — ABNORMAL LOW (ref 38–126)
Alkaline Phosphatase: 33 U/L — ABNORMAL LOW (ref 38–126)
Anion gap: 8 (ref 5–15)
Anion gap: 9 (ref 5–15)
BILIRUBIN TOTAL: 0.6 mg/dL (ref 0.3–1.2)
BUN: 17 mg/dL (ref 6–20)
BUN: 12 mg/dL (ref 6–20)
CALCIUM: 9.9 mg/dL (ref 8.9–10.3)
CHLORIDE: 98 mmol/L — AB (ref 101–111)
CO2: 24 mmol/L (ref 22–32)
CO2: 27 mmol/L (ref 22–32)
CREATININE: 1.06 mg/dL (ref 0.61–1.24)
Calcium: 9.6 mg/dL (ref 8.9–10.3)
Chloride: 108 mmol/L (ref 101–111)
Creatinine, Ser: 1.1 mg/dL (ref 0.61–1.24)
GFR calc Af Amer: >60 mL/min (ref >60)
GFR calc non Af Amer: >60 mL/min (ref >60)
Glucose, Bld: 115 mg/dL — ABNORMAL HIGH (ref 65–99)
Glucose, Bld: 119 mg/dL — ABNORMAL HIGH (ref 65–99)
POTASSIUM: 4.2 mmol/L (ref 3.5–5.1)
Potassium: 3.4 mmol/L — ABNORMAL LOW (ref 3.5–5.1)
SODIUM: 140 mmol/L (ref 135–145)
Sodium: 134 mmol/L — ABNORMAL LOW (ref 135–145)
TOTAL PROTEIN: 7.1 g/dL (ref 6.5–8.1)
Total Bilirubin: 0.5 mg/dL (ref 0.3–1.2)
Total Protein: 6.6 g/dL (ref 6.5–8.1)

## 2015-01-19 LAB — CBC
HCT: 50.7 % (ref 39.0–52.0)
Hemoglobin: 17 g/dL (ref 13.0–17.0)
MCH: 30 pg (ref 26.0–34.0)
MCHC: 33.5 g/dL (ref 30.0–36.0)
MCV: 89.6 fL (ref 78.0–100.0)
Platelets: 334 10*3/uL (ref 150–400)
RBC: 5.66 MIL/uL (ref 4.22–5.81)
RDW: 14.8 % (ref 11.5–15.5)
WBC: 11.4 10*3/uL — ABNORMAL HIGH (ref 4.0–10.5)

## 2015-01-19 MED ORDER — SODIUM CHLORIDE 0.9 % IV SOLN: 250.0000 mL | INTRAVENOUS | Status: DC | PRN

## 2015-01-19 MED ORDER — ONDANSETRON HCL 4 MG/2ML IJ SOLN: 4.0000 mg | Freq: Four times a day (QID) | INTRAMUSCULAR | Status: DC | PRN

## 2015-01-19 MED ORDER — NITROGLYCERIN 0.4 MG SL SUBL: 0.4000 mg | SUBLINGUAL_TABLET | SUBLINGUAL | Status: DC | PRN

## 2015-01-19 MED ORDER — AMLODIPINE BESYLATE 5 MG PO TABS
5.0000 mg | ORAL_TABLET | Freq: Every day | ORAL | Status: DC
  Administered 2015-01-20 – 2015-01-21 (×2): 5 mg via ORAL
  Filled 2015-01-19 (×2): qty 1

## 2015-01-19 MED ORDER — ASPIRIN 81 MG PO CHEW
81.0000 mg | CHEWABLE_TABLET | ORAL | Status: AC
  Administered 2015-01-20: 81 mg via ORAL
  Filled 2015-01-19: qty 1

## 2015-01-19 MED ORDER — ASPIRIN EC 81 MG PO TBEC: 81.0000 mg | DELAYED_RELEASE_TABLET | Freq: Every day | ORAL | Status: DC

## 2015-01-19 MED ORDER — SODIUM CHLORIDE 0.9 % IJ SOLN
3.0000 mL | Freq: Two times a day (BID) | INTRAMUSCULAR | Status: DC
  Administered 2015-01-19 – 2015-01-20 (×2): 3 mL via INTRAVENOUS

## 2015-01-19 MED ORDER — SODIUM CHLORIDE 0.9 % IJ SOLN
3.0000 mL | INTRAMUSCULAR | Status: DC | PRN
Start: 2015-01-19 — End: 2015-01-20

## 2015-01-19 MED ORDER — ATORVASTATIN CALCIUM 40 MG PO TABS
40.0000 mg | ORAL_TABLET | Freq: Every day | ORAL | Status: DC
  Filled 2015-01-19: qty 1

## 2015-01-19 MED ORDER — HEPARIN BOLUS VIA INFUSION
4000.0000 [IU] | Freq: Once | INTRAVENOUS | Status: AC
  Administered 2015-01-19: 4000 [IU] via INTRAVENOUS

## 2015-01-19 MED ORDER — ADULT MULTIVITAMIN W/MINERALS CH
1.0000 | ORAL_TABLET | Freq: Every day | ORAL | Status: DC
  Administered 2015-01-20 – 2015-01-21 (×2): 1 via ORAL
  Filled 2015-01-19 (×2): qty 1

## 2015-01-19 MED ORDER — FENOFIBRATE 160 MG PO TABS
160.0000 mg | ORAL_TABLET | Freq: Every day | ORAL | Status: DC
  Administered 2015-01-20 – 2015-01-21 (×2): 160 mg via ORAL
  Filled 2015-01-19 (×2): qty 1

## 2015-01-19 MED ORDER — SODIUM CHLORIDE 0.9 % WEIGHT BASED INFUSION: 3.0000 mL/kg/h | INTRAVENOUS | Status: DC

## 2015-01-19 MED ORDER — RAMIPRIL 10 MG PO CAPS
10.0000 mg | ORAL_CAPSULE | Freq: Two times a day (BID) | ORAL | Status: DC
  Administered 2015-01-19 – 2015-01-21 (×4): 10 mg via ORAL
  Filled 2015-01-19 (×3): qty 1
  Filled 2015-01-19 (×2): qty 2

## 2015-01-19 MED ORDER — ACETAMINOPHEN 325 MG PO TABS: 650.0000 mg | ORAL_TABLET | ORAL | Status: DC | PRN

## 2015-01-19 MED ORDER — METOPROLOL TARTRATE 25 MG PO TABS
25.0000 mg | ORAL_TABLET | Freq: Every day | ORAL | Status: DC
  Administered 2015-01-19 – 2015-01-20 (×2): 25 mg via ORAL
  Filled 2015-01-19 (×3): qty 1

## 2015-01-19 MED ORDER — ZOLPIDEM TARTRATE 5 MG PO TABS
5.0000 mg | ORAL_TABLET | Freq: Every evening | ORAL | Status: DC | PRN
  Administered 2015-01-19 – 2015-01-20 (×2): 5 mg via ORAL
  Filled 2015-01-19 (×2): qty 1

## 2015-01-19 MED ORDER — HEPARIN (PORCINE) IN NACL 100-0.45 UNIT/ML-% IJ SOLN
1000.0000 [IU]/h | INTRAMUSCULAR | Status: DC
  Administered 2015-01-19 – 2015-01-20 (×2): 1000 [IU]/h via INTRAVENOUS
  Filled 2015-01-19 (×3): qty 250

## 2015-01-19 MED ORDER — ASPIRIN 81 MG PO CHEW
324.0000 mg | CHEWABLE_TABLET | Freq: Once | ORAL | Status: AC
  Administered 2015-01-19: 324 mg via ORAL
  Filled 2015-01-19: qty 4

## 2015-01-19 MED ORDER — SODIUM CHLORIDE 0.9 % WEIGHT BASED INFUSION: 1.0000 mL/kg/h | INTRAVENOUS | Status: DC

## 2015-01-19 MED ORDER — ASPIRIN EC 81 MG PO TBEC
81.0000 mg | DELAYED_RELEASE_TABLET | Freq: Every day | ORAL | Status: DC
  Administered 2015-01-20: 81 mg via ORAL
  Filled 2015-01-19: qty 1

## 2015-01-19 NOTE — ED Notes (Signed)
carelink here, no changes.

## 2015-01-19 NOTE — ED Notes (Signed)
Patient transported to X-ray 

## 2015-01-19 NOTE — ED Provider Notes (Signed)
CSN: [id number]     Arrival date & time 01/19/15  1719 History  This chart was scribed for Troy Beers, MD by Chester Holstein, ED Scribe. This patient was seen in room MH08/MH08 and the patient's care was started at 6:35 PM.    Chief Complaint  Patient presents with  . Chest Pain     Patient is a 64 y.o. male presenting with chest pain. The history is provided by the patient. No language interpreter was used.  Chest Pain Chest pain location: central. Pain quality: pressure   Pain radiates to:  Does not radiate Pain radiates to the back: no   Pain severity:  Moderate Onset quality:  Sudden Duration:  16 hours Timing:  Intermittent Progression:  Unchanged Chronicity:  New Context: at rest   Relieved by:  None tried Worsened by:  Movement Ineffective treatments:  None tried Associated symptoms: dizziness (mild, resolved)   Associated symptoms: no diaphoresis, no fever, no nausea and no shortness of breath   Risk factors: coronary artery disease, diabetes mellitus, high cholesterol and hypertension   Risk factors: no prior DVT/PE    HPI Comments: Robbert Langlinais is a 64 y.o. male h/o CAD and stent placement, HTN, HLD, and DM who presents to the Emergency Department complaining of intermittent central chest pain with onset at 2 AM. Pt was resting at onset. Last episode was around 2:30 PM lasting approximately 30-60 min and he was resting on a boat at that time. Pt states it feels as though someone is pushing on his chest. He reports mild dizziness this afternoon which has resolved and denies pain radiation. He notes standing and moving around alleviates the pain. Pt took baby ASA this morning per usual. Pt reports pain is similar to pain experienced before stent placement in 2002. He denies h/o DVT/PE.  He denies fever, SOB, nausea, and diaphoresis.  Past Medical History  Diagnosis Date  . Hyperlipidemia   . Hypertension   . Erectile dysfunction   . Diabetes mellitus without  complication   . CAD (coronary artery disease)     Cath 06/08/01 at [facility] with normal left main and LAD, 95% circumflex and 85% and 80% proximal right coronary artery stenosis  RCA stent 3.5 x 23 mm Penta Circumflex stent 4.0 x 15 mm Penta    Past Surgical History  Procedure Laterality Date  . Angioplasty  2002    2 stents  . Rotator cuff repair Right 2009  . Trigger finger release  2013    4 fingers  . Rotator cuff repair Left 2014  . Coronary angioplasty with stent placement    . Cardiac catheterization N/A 01/20/2015    Procedure: Left Heart Cath and Coronary Angiography;  Surgeon: Belva Crome, MD;  Location: [place] [facility];  Service: Cardiovascular;  Laterality: N/A;   Family History  Problem Relation Age of Onset  . Colon cancer Neg Hx   . CAD Brother   . CAD Sister   . Heart disease Father     Valve replacement and CAD   History  Substance Use Topics  . Smoking status: Current Every Day Smoker -- 1.00 packs/day for 44 years    Types: Cigarettes  . Smokeless tobacco: Never Used  . Alcohol Use: Yes     Comment: occasionally    Review of Systems  Constitutional: Negative for fever and diaphoresis.  Respiratory: Negative for shortness of breath.   Cardiovascular: Positive for chest pain.  Gastrointestinal: Negative for nausea.  Neurological: Positive for dizziness (mild, resolved).      Allergies  Review of patient's allergies indicates no known allergies.  Home Medications   Prior to Admission medications   Medication Sig Start Date End Date Taking? Authorizing Provider  amLODipine (NORVASC) 5 MG tablet TAKE 1 TABLET BY MOUTH DAILY 11/04/14  Yes Lelon Perla, MD  aspirin 81 MG tablet Take 81 mg by mouth daily.   Yes Historical Provider, MD  atorvastatin (LIPITOR) 40 MG tablet TAKE 1 TABLET BY MOUTH DAILY 10/17/14  Yes Lelon Perla, MD  fenofibrate 160 MG tablet TAKE 1 TABLET BY MOUTH EVERY DAY 03/26/14  Yes Lelon Perla, MD  KRILL  OIL PO Take by mouth daily.   Yes Historical Provider, MD  metFORMIN (GLUCOPHAGE) 500 MG tablet TAKE 1 TABLET(500 MG) BY MOUTH TWICE DAILY WITH A MEAL 10/22/14  Yes Golden Circle, FNP  metoprolol tartrate (LOPRESSOR) 25 MG tablet TAKE 1 TABLET BY MOUTH DAILY 11/12/14  Yes Lelon Perla, MD  Multiple Vitamin (MULTIVITAMIN) tablet Take 1 tablet by mouth daily.   Yes Historical Provider, MD  nitroGLYCERIN (NITROSTAT) 0.4 MG SL tablet Place 1 tablet (0.4 mg total) under the tongue every 5 (five) minutes as needed for chest pain. 09/18/14  Yes Lelon Perla, MD  ramipril (ALTACE) 10 MG capsule Take 1 capsule (10 mg total) by mouth 2 (two) times daily. 08/15/14  Yes Lelon Perla, MD  sildenafil (VIAGRA) 100 MG tablet Take 100 mg by mouth daily as needed for erectile dysfunction.   Yes Historical Provider, MD  zolpidem (AMBIEN) 5 MG tablet Take 5 mg by mouth at bedtime as needed for sleep.   Yes Historical Provider, MD   BP 135/82 mmHg  Pulse 67  Temp(Src) 98.2 F (36.8 C) (Oral)  Resp 20  Ht 5\' 5"  (1.651 m)  Wt 154 lb 5.2 oz (70 kg)  BMI 25.68 kg/m2  SpO2 97% Physical Exam  Constitutional: He is oriented to person, place, and time. He appears well-developed and well-nourished.  HENT:  Head: Normocephalic.  Eyes: Conjunctivae are normal.  Neck: Normal range of motion. Neck supple.  Pulmonary/Chest: Effort normal.  Musculoskeletal: Normal range of motion.  Neurological: He is alert and oriented to person, place, and time.  Skin: Skin is warm and dry.  Psychiatric: He has a normal mood and affect. His behavior is normal.  Nursing note and vitals reviewed. PE- awake, alert, NAD, lungs CTA, CV- RRR, no murmur, abdomen soft, nontender, no pedal edema in lower extremiteis, neuro - alert and awake,  ED Course  Procedures (including critical care time) DIAGNOSTIC STUDIES: Oxygen Saturation is 97% on room air, normal by my interpretation.    COORDINATION OF CARE: 6:42 PM Discussed  treatment plan with patient at beside, the patient agrees with the plan and has no further questions at this time.  CRITICAL CARE Performed by: Threasa Beards Total critical care time: 35 Critical care time was exclusive of separately billable procedures and treating other patients. Critical care was necessary to treat or prevent imminent or life-threatening deterioration. Critical care was time spent personally by me on the following activities: development of treatment plan with patient and/or surrogate as well as nursing, discussions with consultants, evaluation of patient's response to treatment, examination of patient, obtaining history from patient or surrogate, ordering and performing treatments and interventions, ordering and review of laboratory studies, ordering and review of radiographic studies, pulse oximetry and re-evaluation of patient's condition.   Labs Review  Labs Reviewed  COMPREHENSIVE METABOLIC PANEL - Abnormal; Notable for the following:    Glucose, Bld 115 (*)    Alkaline Phosphatase 34 (*)    All other components within normal limits  TROPONIN I - Abnormal; Notable for the following:    Troponin I 0.32 (*)    All other components within normal limits  CBC - Abnormal; Notable for the following:    WBC 11.4 (*)    All other components within normal limits  COMPREHENSIVE METABOLIC PANEL - Abnormal; Notable for the following:    Sodium 134 (*)    Potassium 3.4 (*)    Chloride 98 (*)    Glucose, Bld 119 (*)    Alkaline Phosphatase 33 (*)    All other components within normal limits  TROPONIN I - Abnormal; Notable for the following:    Troponin I 0.59 (*)    All other components within normal limits  TROPONIN I - Abnormal; Notable for the following:    Troponin I 0.64 (*)    All other components within normal limits  TROPONIN I - Abnormal; Notable for the following:    Troponin I 0.52 (*)    All other components within normal limits  LIPID PANEL - Abnormal;  Notable for the following:    Triglycerides 311 (*)    HDL 28 (*)    VLDL 62 (*)    All other components within normal limits  HEPARIN LEVEL (UNFRACTIONATED) - Abnormal; Notable for the following:    Heparin Unfractionated <0.10 (*)    All other components within normal limits  HEPARIN LEVEL (UNFRACTIONATED) - Abnormal; Notable for the following:    Heparin Unfractionated <0.10 (*)    All other components within normal limits  GLUCOSE, CAPILLARY - Abnormal; Notable for the following:    Glucose-Capillary 103 (*)    All other components within normal limits  BASIC METABOLIC PANEL - Abnormal; Notable for the following:    Calcium 8.8 (*)    All other components within normal limits  GLUCOSE, CAPILLARY - Abnormal; Notable for the following:    Glucose-Capillary 113 (*)    All other components within normal limits  GLUCOSE, CAPILLARY - Abnormal; Notable for the following:    Glucose-Capillary 102 (*)    All other components within normal limits  CBC  PROTIME-INR  CBC  POCT ACTIVATED CLOTTING TIME    Imaging Review Dg Chest 2 View  01/19/2015   CLINICAL DATA:  Chest pain, chest pressure since last night  EXAM: CHEST  2 VIEW  COMPARISON:  None.  FINDINGS: The heart size and mediastinal contours are within normal limits. Both lungs are clear. The visualized skeletal structures are unremarkable.  IMPRESSION: No active cardiopulmonary disease.   Electronically Signed   By: Kathreen Devoid   On: 01/19/2015 17:49     EKG Interpretation   Date/Time:  Sunday January 19 2015 17:25:06 EDT Ventricular Rate:  82 PR Interval:  136 QRS Duration: 82 QT Interval:  386 QTC Calculation: 450 R Axis:   -72 Text Interpretation:  Normal sinus rhythm Left axis deviation Abnormal ECG  No old tracing to compare Confirmed by [facility]  MD, Millissa Deese [phone]) on  01/19/2015 7:09:18 PM      MDM   Final diagnoses:  NSTEMI (non-ST elevated myocardial infarction)  Chest pain, unspecified chest pain type   Pt  with hx of CAD presenting after 2 episodes of chest pain.  Currently chest pain free.  EKG is nonischemic.  Troponin is  elevated,  Pt given aspirin and started on heparin.  CXR is reassuring.   Xray images reviewed and interpreted by me as well.   I personally performed the services described in this documentation, which was scribed in my presence. The recorded information has been reviewed and is accurate.   7:11 PM d/w Dr. Wynonia Lawman, cardiology, he has accepted patient to telemetry bed.  Pt is not having chest pain at this time.  Heparin ordered  Troy Beers, MD 01/21/15 [phone]

## 2015-01-19 NOTE — H&P (Signed)
History and Physical   Admit date: 01/19/2015 Name:  Troy Boyer Medical record number: [id number] DOB/Age:  [birth year]  64 y.o. male  Referring Physician:   [facility] [place]  Primary Cardiologist: Dr. Stanford Breed  Primary Physician:  Dr. Leighton Ruff  Chief complaint/reason for admission: Non-STEMI  HPI:  This 64 year old male has a history of coronary artery disease and had bare metal stents placed at [facility] in 2002 to the right coronary artery and circumflex.  At that time his LAD and left main were reported as normal.  He has done well over the years but is continued to smoke and was recently diagnosed with diabetes.  He reports a 20 pound weight loss try and control and without medicines.  He was awakened this morning around 2:30 with midsternal chest discomfort similar to the prior symptoms.  He took a nitroglycerin which caused him to sweat but he continued to have pain and then he took times and it went away.  [place] and he was laying on a chase lunch and had recurrence of chest discomfort and came back and was seen at the emergency room.  Troponins are abnormal EKG was unremarkable.  He has had no recurrence of pain and feels fine now.  Normally denies PND, orthopnea or edema.   Past Medical History  Diagnosis Date  . Hyperlipidemia   . Hypertension   . Erectile dysfunction   . Diabetes mellitus without complication   . CAD (coronary artery disease)     Cath 06/08/01 at [facility] regional with normal left main and LAD, 95% circumflex and 85% and 80% proximal right coronary artery stenosis  RCA stent 3.5 x 23 mm Penta Circumflex stent 4.0 x 15 mm Penta      Past Surgical History  Procedure Laterality Date  . Angioplasty  2002    2 stents  . Rotator cuff repair Right 2009  . Trigger finger release  2013    4 fingers  . Rotator cuff repair Left 2014  . Coronary angioplasty with stent placement     Allergies: has  No Known Allergies.   Medications: Prior to Admission medications   Medication Sig Start Date End Date Taking? Authorizing Provider  amLODipine (NORVASC) 5 MG tablet TAKE 1 TABLET BY MOUTH DAILY 11/04/14  Yes Lelon Perla, MD  aspirin 81 MG tablet Take 81 mg by mouth daily.   Yes Historical Provider, MD  atorvastatin (LIPITOR) 40 MG tablet TAKE 1 TABLET BY MOUTH DAILY 10/17/14  Yes Lelon Perla, MD  fenofibrate 160 MG tablet TAKE 1 TABLET BY MOUTH EVERY DAY 03/26/14  Yes Lelon Perla, MD  KRILL OIL PO Take by mouth daily.   Yes Historical Provider, MD  metoprolol tartrate (LOPRESSOR) 25 MG tablet TAKE 1 TABLET BY MOUTH DAILY 11/12/14  Yes Lelon Perla, MD  Multiple Vitamin (MULTIVITAMIN) tablet Take 1 tablet by mouth daily.   Yes Historical Provider, MD  nitroGLYCERIN (NITROSTAT) 0.4 MG SL tablet Place 1 tablet (0.4 mg total) under the tongue every 5 (five) minutes as needed for chest pain. 09/18/14  Yes Lelon Perla, MD  ramipril (ALTACE) 10 MG capsule Take 1 capsule (10 mg total) by mouth 2 (two) times daily. 08/15/14  Yes Lelon Perla, MD  Sildenafil Citrate (VIAGRA PO) Take by mouth as needed.   Yes Historical Provider, MD  zolpidem (AMBIEN) 5 MG tablet Take 5 mg by mouth at  bedtime as needed for sleep.   Yes Historical Provider, MD  metFORMIN (GLUCOPHAGE) 500 MG tablet TAKE 1 TABLET(500 MG) BY MOUTH TWICE DAILY WITH A MEAL 10/22/14   Golden Circle, FNP   Family History:  Family Status  Relation Status Death Age  . Brother Deceased 64    Renal failure, heart trouble  . Sister Alive   . Father Deceased 36    Melanoma, had prior aortic valve replacement  . Mother Deceased 2   Social History:   reports that he has been smoking Cigarettes.  He has a 44 pack-year smoking history. He has never used smokeless tobacco. He reports that he drinks alcohol. He reports that he does not use illicit drugs.   History   Social History Narrative   Fun: Boat, golf   Denies  religious beliefs effecting health care.      Review of Systems:  He has a voluntary 20 pound weight loss, wears glasses and has early cataracts.  He has occasional urgency as well as hesitancy and has erectile dysfunction.  No history of GI bleeding. Other than as noted above, the remainder of the review of systems is normal  Physical Exam: BP 158/84 mmHg  Pulse 84  Temp(Src) 98.4 F (36.9 C) (Oral)  Resp 18  Ht 5\' 5"  (1.651 m)  Wt 70.1 kg (154 lb 8.7 oz)  BMI 25.72 kg/m2  SpO2 98% General appearance: Pleasant well tanned male currently in no acute distress Head: Normocephalic, without obvious abnormality, atraumatic Eyes: conjunctivae/corneas clear. PERRL, EOM's intact. Fundi not examined Neck: no adenopathy, no carotid bruit, no JVD and supple, symmetrical, trachea midline Lungs: clear to auscultation bilaterally Heart: regular rate and rhythm, S1, S2 normal, no murmur, click, rub or gallop Abdomen: soft, non-tender; bowel sounds normal; no masses,  no organomegaly Rectal: deferred Extremities: extremities normal, atraumatic, no cyanosis or edema Pulses: 2+ and symmetric Skin: Skin color, texture, turgor normal. No rashes or lesions Neurologic: Grossly normal  Labs: CBC  Recent Labs  01/19/15 1736  WBC 11.4*  RBC 5.66  HGB 17.0  HCT 50.7  PLT 334  MCV 89.6  MCH 30.0  MCHC 33.5  RDW 14.8   CMP   Recent Labs  01/19/15 1736  NA 140  K 4.2  CL 108  CO2 24  GLUCOSE 115*  BUN 17  CREATININE 1.10  CALCIUM 9.9  PROT 7.1  ALBUMIN 4.9  AST 25  ALT 24  ALKPHOS 34*  BILITOT 0.5  GFRNONAA >60  GFRAA >60   Cardiac Panel (last 3 results)  Recent Labs  01/19/15 1736  TROPONINI 0.32*     EKG: Normal sinus rhythm with PACs, left axis deviation  Radiology: No active cardiopulmonary disease   IMPRESSIONS: 1.  Non-STEMI 2.  Coronary artery disease with previous bare-metal stent to the right coronary artery and circumflex 3.  Ongoing tobacco abuse  advised to stop 4.  Hypertension 5.  Hyperlipidemia under treatment 6.  Non-insulin-dependent diabetes mellitus  PLAN: Continue heparin.  He is currently pain-free.  He will need to have cardiac catheterization to define extent of coronary artery disease. Cardiac catheterization procedure was discussed with the patient fully including risks of myocardial infarction, death, stroke, bleeding, arrhythmia, dye allergy, or renal insufficiency. The patient understands and is willing to proceed.  Possibility of stenting discussed with patient including risks and he is agreeable.  Signed: Kerry Hough MD [facility]  01/19/2015, 9:44 PM

## 2015-01-19 NOTE — ED Notes (Signed)
Pt placed on continuous monitor/pulse ox

## 2015-01-19 NOTE — Progress Notes (Signed)
ANTICOAGULATION CONSULT NOTE - Initial Consult  Pharmacy Consult for Heparin Indication: chest pain/ACS  No Known Allergies  Patient Measurements:   Heparin Dosing Weight: 78 kg  Vital Signs: Temp: 98.2 F (36.8 C) (07/24 1729) Temp Source: Oral (07/24 1729) BP: 136/79 mmHg (07/24 2000) Pulse Rate: 72 (07/24 2000)  Labs:  Recent Labs  01/19/15 1736  HGB 17.0  HCT 50.7  PLT 334  CREATININE 1.10  TROPONINI 0.32*    CrCl cannot be calculated (Unknown ideal weight.).   Medical History: Past Medical History  Diagnosis Date  . Hyperlipidemia   . Hypertension   . CAD (coronary artery disease)   . Erectile dysfunction   . Diabetes mellitus without complication     Medications:  Prescriptions prior to admission  Medication Sig Dispense Refill Last Dose  . amLODipine (NORVASC) 5 MG tablet TAKE 1 TABLET BY MOUTH DAILY 90 tablet 2   . aspirin 81 MG tablet Take 81 mg by mouth daily.   Taking  . atorvastatin (LIPITOR) 40 MG tablet TAKE 1 TABLET BY MOUTH DAILY 90 tablet 2 Taking  . fenofibrate 160 MG tablet TAKE 1 TABLET BY MOUTH EVERY DAY 90 tablet 1 Taking  . KRILL OIL PO Take by mouth daily.   Taking  . metoprolol tartrate (LOPRESSOR) 25 MG tablet TAKE 1 TABLET BY MOUTH DAILY 90 tablet 2   . Multiple Vitamin (MULTIVITAMIN) tablet Take 1 tablet by mouth daily.   Taking  . nitroGLYCERIN (NITROSTAT) 0.4 MG SL tablet Place 1 tablet (0.4 mg total) under the tongue every 5 (five) minutes as needed for chest pain. 25 tablet 3 Taking  . ramipril (ALTACE) 10 MG capsule Take 1 capsule (10 mg total) by mouth 2 (two) times daily. 60 capsule 3 Taking  . Sildenafil Citrate (VIAGRA PO) Take by mouth as needed.   Taking  . zolpidem (AMBIEN) 5 MG tablet Take 5 mg by mouth at bedtime as needed for sleep.   Taking  . metFORMIN (GLUCOPHAGE) 500 MG tablet TAKE 1 TABLET(500 MG) BY MOUTH TWICE DAILY WITH A MEAL 180 tablet 1    Scheduled:   Infusions:  . heparin 1,000 Units/hr (01/19/15  1912)    Assessment: 64yo male presents to [facility] with CP. Pharmacy is consulted to monitor and adjust heparin for ACS/chest pain. CBC wnl, sCr 1.1, Trop 0.32.  Goal of Therapy:  Heparin level 0.3-0.7 units/ml Monitor platelets by anticoagulation protocol: Yes   Plan:  Give 4000 units bolus x 1 Start heparin infusion at 1000 units/hr Check anti-Xa level in 6 hours and daily while on heparin Continue to monitor H&H and platelets  Andrey Cota. Diona Foley, PharmD Clinical Pharmacist Pager [phone] 01/19/2015,8:45 PM

## 2015-01-19 NOTE — ED Notes (Addendum)
Pt reports chest pain, pressure to centralized chest - onset 2 am with feelings of lightheadedness, sweating, pain intermittent. Denies pain at present. Pt reports taking last dose of Viagra Saturday morning at 0730.

## 2015-01-20 ENCOUNTER — Encounter (HOSPITAL_COMMUNITY)
Admission: EM | Disposition: A | Discharge status: Home / Self Care | Payer: Self-pay | Source: Home / Self Care | Attending: Cardiovascular Disease

## 2015-01-20 DIAGNOSIS — I2510 Atherosclerotic heart disease of native coronary artery without angina pectoris: Secondary | ICD-10-CM

## 2015-01-20 DIAGNOSIS — I214 Non-ST elevation (NSTEMI) myocardial infarction: Secondary | ICD-10-CM

## 2015-01-20 HISTORY — PX: CARDIAC CATHETERIZATION: SHX172

## 2015-01-20 LAB — CBC
HCT: 47.8 % (ref 39.0–52.0)
Hemoglobin: 15.6 g/dL (ref 13.0–17.0)
MCH: 29.9 pg (ref 26.0–34.0)
MCHC: 32.6 g/dL (ref 30.0–36.0)
MCV: 91.7 fL (ref 78.0–100.0)
Platelets: 301 10*3/uL (ref 150–400)
RBC: 5.21 MIL/uL (ref 4.22–5.81)
RDW: 14.4 % (ref 11.5–15.5)
WBC: 9.3 10*3/uL (ref 4.0–10.5)

## 2015-01-20 LAB — LIPID PANEL
Cholesterol: 165 mg/dL (ref 0–200)
HDL: 28 mg/dL — AB (ref >40)
LDL CALC: 75 mg/dL (ref 0–99)
TRIGLYCERIDES: 311 mg/dL — AB (ref <150)
Total CHOL/HDL Ratio: 5.9 RATIO
VLDL: 62 mg/dL — AB (ref 0–40)

## 2015-01-20 LAB — GLUCOSE, CAPILLARY
Glucose-Capillary: 103 mg/dL — ABNORMAL HIGH (ref 65–99)
Glucose-Capillary: 113 mg/dL — ABNORMAL HIGH (ref 65–99)

## 2015-01-20 LAB — PROTIME-INR
INR: 1.11 (ref 0.00–1.49)
Prothrombin Time: 14.5 seconds (ref 11.6–15.2)

## 2015-01-20 LAB — POCT ACTIVATED CLOTTING TIME: Activated Clotting Time: 484 seconds

## 2015-01-20 LAB — TROPONIN I
TROPONIN I: 0.64 ng/mL — AB (ref <0.031)
Troponin I: 0.52 ng/mL (ref <0.031)

## 2015-01-20 LAB — HEPARIN LEVEL (UNFRACTIONATED)
Heparin Unfractionated: <0.1 IU/mL — ABNORMAL LOW (ref 0.30–0.70)
Heparin Unfractionated: <0.1 IU/mL — ABNORMAL LOW (ref 0.30–0.70)

## 2015-01-20 SURGERY — LEFT HEART CATH AND CORONARY ANGIOGRAPHY
Anesthesia: LOCAL

## 2015-01-20 MED ORDER — ONDANSETRON HCL 4 MG/2ML IJ SOLN: 4.0000 mg | Freq: Four times a day (QID) | INTRAMUSCULAR | Status: DC | PRN

## 2015-01-20 MED ORDER — SODIUM CHLORIDE 0.9 % IJ SOLN
3.0000 mL | Freq: Two times a day (BID) | INTRAMUSCULAR | Status: DC
  Administered 2015-01-20: 18:00:00 via INTRAVENOUS

## 2015-01-20 MED ORDER — MIDAZOLAM HCL 2 MG/2ML IJ SOLN
INTRAMUSCULAR | Status: DC | PRN
  Administered 2015-01-20 (×2): 1 mg via INTRAVENOUS

## 2015-01-20 MED ORDER — HEPARIN (PORCINE) IN NACL 2-0.9 UNIT/ML-% IJ SOLN
INTRAMUSCULAR | Status: AC
  Filled 2015-01-20: qty 1500

## 2015-01-20 MED ORDER — SODIUM CHLORIDE 0.9 % IV SOLN: 250.0000 mL | INTRAVENOUS | Status: DC | PRN

## 2015-01-20 MED ORDER — SODIUM CHLORIDE 0.9 % IV SOLN
250.0000 mg | INTRAVENOUS | Status: DC | PRN
  Administered 2015-01-20: 1.75 mg/kg/h via INTRAVENOUS

## 2015-01-20 MED ORDER — BIVALIRUDIN 250 MG IV SOLR
INTRAVENOUS | Status: AC
  Filled 2015-01-20: qty 250

## 2015-01-20 MED ORDER — ATORVASTATIN CALCIUM 80 MG PO TABS
80.0000 mg | ORAL_TABLET | Freq: Every day | ORAL | Status: DC
  Administered 2015-01-20: 80 mg via ORAL
  Filled 2015-01-20: qty 1

## 2015-01-20 MED ORDER — NITROGLYCERIN 1 MG/10 ML FOR IR/CATH LAB
INTRA_ARTERIAL | Status: AC
  Filled 2015-01-20: qty 10

## 2015-01-20 MED ORDER — VERAPAMIL HCL 2.5 MG/ML IV SOLN
INTRAVENOUS | Status: DC | PRN
  Administered 2015-01-20: 13:00:00 via INTRA_ARTERIAL

## 2015-01-20 MED ORDER — FENTANYL CITRATE (PF) 100 MCG/2ML IJ SOLN
INTRAMUSCULAR | Status: DC | PRN
  Administered 2015-01-20: 50 ug via INTRAVENOUS

## 2015-01-20 MED ORDER — OXYCODONE-ACETAMINOPHEN 5-325 MG PO TABS: 1.0000 | ORAL_TABLET | ORAL | Status: DC | PRN

## 2015-01-20 MED ORDER — IOHEXOL 350 MG/ML SOLN
INTRAVENOUS | Status: DC | PRN
  Administered 2015-01-20: 165 mL via INTRA_ARTERIAL

## 2015-01-20 MED ORDER — TICAGRELOR 90 MG PO TABS
ORAL_TABLET | ORAL | Status: DC | PRN
  Administered 2015-01-20: 180 mg via ORAL

## 2015-01-20 MED ORDER — SODIUM CHLORIDE 0.9 % WEIGHT BASED INFUSION
3.0000 mL/kg/h | INTRAVENOUS | Status: AC
  Administered 2015-01-20: 3 mL/kg/h via INTRAVENOUS

## 2015-01-20 MED ORDER — SODIUM CHLORIDE 0.9 % IV SOLN
INTRAVENOUS | Status: DC | PRN
  Administered 2015-01-20: 70 mL/h via INTRAVENOUS

## 2015-01-20 MED ORDER — MIDAZOLAM HCL 2 MG/2ML IJ SOLN
INTRAMUSCULAR | Status: AC
  Filled 2015-01-20: qty 2

## 2015-01-20 MED ORDER — ASPIRIN 81 MG PO CHEW
81.0000 mg | CHEWABLE_TABLET | Freq: Every day | ORAL | Status: DC
  Administered 2015-01-21: 10:00:00 81 mg via ORAL
  Filled 2015-01-20: qty 1

## 2015-01-20 MED ORDER — BIVALIRUDIN BOLUS VIA INFUSION - CUPID
INTRAVENOUS | Status: DC | PRN
  Administered 2015-01-20: 53.325 mg via INTRAVENOUS

## 2015-01-20 MED ORDER — SODIUM CHLORIDE 0.9 % IJ SOLN: 3.0000 mL | INTRAMUSCULAR | Status: DC | PRN

## 2015-01-20 MED ORDER — TICAGRELOR 90 MG PO TABS
90.0000 mg | ORAL_TABLET | Freq: Two times a day (BID) | ORAL | Status: DC
  Administered 2015-01-21 (×2): 90 mg via ORAL
  Filled 2015-01-20 (×2): qty 1

## 2015-01-20 MED ORDER — VERAPAMIL HCL 2.5 MG/ML IV SOLN
INTRAVENOUS | Status: AC
  Filled 2015-01-20: qty 2

## 2015-01-20 MED ORDER — HEPARIN SODIUM (PORCINE) 1000 UNIT/ML IJ SOLN
INTRAMUSCULAR | Status: AC
  Filled 2015-01-20: qty 1

## 2015-01-20 MED ORDER — LIDOCAINE HCL (PF) 1 % IJ SOLN
INTRAMUSCULAR | Status: AC
  Filled 2015-01-20: qty 30

## 2015-01-20 MED ORDER — NITROGLYCERIN 1 MG/10 ML FOR IR/CATH LAB
INTRA_ARTERIAL | Status: DC | PRN
  Administered 2015-01-20: 14:00:00

## 2015-01-20 MED ORDER — HEPARIN SODIUM (PORCINE) 1000 UNIT/ML IJ SOLN
INTRAMUSCULAR | Status: DC | PRN
  Administered 2015-01-20: 4000 [IU] via INTRAVENOUS

## 2015-01-20 MED ORDER — FENTANYL CITRATE (PF) 100 MCG/2ML IJ SOLN
INTRAMUSCULAR | Status: AC
  Filled 2015-01-20: qty 2

## 2015-01-20 MED ORDER — TICAGRELOR 90 MG PO TABS
ORAL_TABLET | ORAL | Status: AC
  Filled 2015-01-20: qty 2

## 2015-01-20 MED ORDER — ACETAMINOPHEN 325 MG PO TABS: 650.0000 mg | ORAL_TABLET | ORAL | Status: DC | PRN

## 2015-01-20 MED ORDER — POTASSIUM CHLORIDE CRYS ER 20 MEQ PO TBCR
40.0000 meq | EXTENDED_RELEASE_TABLET | Freq: Once | ORAL | Status: AC
  Administered 2015-01-20: 40 meq via ORAL
  Filled 2015-01-20: qty 2

## 2015-01-20 MED ORDER — NICOTINE 14 MG/24HR TD PT24
14.0000 mg | MEDICATED_PATCH | Freq: Every day | TRANSDERMAL | Status: DC
Start: 2015-01-20 — End: 2015-01-21
  Administered 2015-01-20 – 2015-01-21 (×2): 14 mg via TRANSDERMAL
  Filled 2015-01-20 (×2): qty 1

## 2015-01-20 SURGICAL SUPPLY — 20 items
BALLN ANGIOSCULPT RX 3.0X10 (BALLOONS) ×2
BALLN ~~LOC~~ EMERGE MR 4.0X15 (BALLOONS) ×2
BALLOON ANGIOSCULPT RX 3.0X10 (BALLOONS) ×1 IMPLANT
BALLOON ~~LOC~~ EMERGE MR 4.0X15 (BALLOONS) ×1 IMPLANT
CATH INFINITI 5 FR JL3.5 (CATHETERS) ×2 IMPLANT
CATH INFINITI JR4 5F (CATHETERS) ×2 IMPLANT
CATH VISTA GUIDE 6FR XB3.5 (CATHETERS) ×2 IMPLANT
DEVICE RAD COMP TR BAND LRG (VASCULAR PRODUCTS) ×2 IMPLANT
GLIDESHEATH SLEND A-KIT 6F 22G (SHEATH) ×2 IMPLANT
KIT ENCORE 26 ADVANTAGE (KITS) ×2 IMPLANT
KIT HEART LEFT (KITS) ×2 IMPLANT
PACK CARDIAC CATHETERIZATION (CUSTOM PROCEDURE TRAY) ×2 IMPLANT
STENT PROMUS PREM MR 3.5X20 (Permanent Stent) ×2 IMPLANT
STOPCOCK MORSE 400PSI 3WAY (MISCELLANEOUS) ×2 IMPLANT
TRANSDUCER W/STOPCOCK (MISCELLANEOUS) ×2 IMPLANT
TUBING CIL FLEX 10 FLL-RA (TUBING) ×2 IMPLANT
WIRE ASAHI PROWATER 180CM (WIRE) ×2 IMPLANT
WIRE HI TORQ BMW 190CM (WIRE) ×2 IMPLANT
WIRE HI TORQ VERSACORE-J 145CM (WIRE) ×2 IMPLANT
WIRE SAFE-T 1.5MM-J .035X260CM (WIRE) ×2 IMPLANT

## 2015-01-20 NOTE — Progress Notes (Signed)
ANTICOAGULATION CONSULT NOTE - Follow Up Consult  Pharmacy Consult for Heparin  Indication: chest pain/ACS  No Known Allergies  Patient Measurements: Height: 5\' 5"  (165.1 cm) Weight: 154 lb 8.7 oz (70.1 kg) IBW/kg (Calculated) : 61.5 Vital Signs: Temp: 98.5 F (36.9 C) (07/25 0220) Temp Source: Oral (07/25 0220) BP: 125/73 mmHg (07/25 0220) Pulse Rate: 65 (07/25 0220)  Labs:  Recent Labs  01/19/15 1736 01/19/15 2239 01/20/15 0325  HGB 17.0  --  15.6  HCT 50.7  --  47.8  PLT 334  --  301  LABPROT  --   --  14.5  INR  --   --  1.11  HEPARINUNFRC  --   --  <0.10*  CREATININE 1.10 1.06  --   TROPONINI 0.32* 0.59*  --     Estimated Creatinine Clearance: 62.9 mL/min (by C-G formula based on Cr of 1.06).   Assessment: Undetectable heparin level. Per RN, heparin was found disconnected earlier with an unknown amount of off time.   Goal of Therapy:  Heparin level 0.3-0.7 units/ml Monitor platelets by anticoagulation protocol: Yes   Plan:  -Continue heparin at 1000 units/hr given unknown amount of off time -1200 HL, possible cath today -Daily CBC/HL -Monitor for bleeding  Narda Bonds 01/20/2015,4:50 AM

## 2015-01-20 NOTE — Interval H&P Note (Signed)
Cath Lab Visit (complete for each Cath Lab visit)  Clinical Evaluation Leading to the Procedure:   ACS: Yes.    Non-ACS:    Anginal Classification: CCS III  Anti-ischemic medical therapy: Maximal Therapy (2 or more classes of medications)  Non-Invasive Test Results: No non-invasive testing performed  Prior CABG: No previous CABG      History and Physical Interval Note:  01/20/2015 12:30 PM  Troy Boyer  has presented today for surgery, with the diagnosis of unstable angina  The various methods of treatment have been discussed with the patient and family. After consideration of risks, benefits and other options for treatment, the patient has consented to  Procedure(s): Left Heart Cath and Coronary Angiography (N/A) as a surgical intervention .  The patient's history has been reviewed, patient examined, no change in status, stable for surgery.  I have reviewed the patient's chart and labs.  Questions were answered to the patient's satisfaction.     Troy Boyer

## 2015-01-20 NOTE — Progress Notes (Signed)
Patient Name: Troy Boyer Date of Encounter: 01/20/2015  Primary Cardiologist: Dr. Stanford Breed   Principal Problem:   NSTEMI (non-ST elevated myocardial infarction) Active Problems:   Hypertension   Hyperlipidemia   CAD (coronary artery disease)   Tobacco abuse   Non-insulin dependent type 2 diabetes mellitus    SUBJECTIVE  Denies any CP or SOB.   CURRENT MEDS . amLODipine  5 mg Oral Daily  . aspirin EC  81 mg Oral Daily  . atorvastatin  40 mg Oral Daily  . fenofibrate  160 mg Oral Daily  . metoprolol tartrate  25 mg Oral Daily  . multivitamin with minerals  1 tablet Oral Daily  . nicotine  14 mg Transdermal Daily  . ramipril  10 mg Oral BID  . sodium chloride  3 mL Intravenous Q12H    OBJECTIVE  Filed Vitals:   01/19/15 2045 01/19/15 2250 01/20/15 0220 01/20/15 0640  BP: 158/84 132/77 125/73 126/71  Pulse: 84 68 65 68  Temp: 98.4 F (36.9 C)  98.5 F (36.9 C)   TempSrc: Oral  Oral   Resp: 18 18 16    Height: 5\' 5"  (1.651 m)     Weight: 154 lb 8.7 oz (70.1 kg)   156 lb 12 oz (71.1 kg)  SpO2: 98% 97% 96% 95%    Intake/Output Summary (Last 24 hours) at 01/20/15 0856 Last data filed at 01/20/15 0000  Gross per 24 hour  Intake    291 ml  Output      0 ml  Net    291 ml   Filed Weights   01/19/15 2045 01/20/15 0640  Weight: 154 lb 8.7 oz (70.1 kg) 156 lb 12 oz (71.1 kg)    PHYSICAL EXAM  General: Pleasant, NAD. Neuro: Alert and oriented X 3. Moves all extremities spontaneously. Psych: Normal affect. HEENT:  Normal  Neck: Supple without bruits or JVD. Lungs:  Resp regular and unlabored, CTA. Heart: RRR no s3, s4, or murmurs. Abdomen: Soft, non-tender, non-distended, BS + x 4.  Extremities: No clubbing, cyanosis or edema. DP/PT/Radials 2+ and equal bilaterally.  Accessory Clinical Findings  CBC  Recent Labs  01/19/15 1736 01/20/15 0325  WBC 11.4* 9.3  HGB 17.0 15.6  HCT 50.7 47.8  MCV 89.6 91.7  PLT 334 932   Basic Metabolic  Panel  Recent Labs  01/19/15 1736 01/19/15 2239  NA 140 134*  K 4.2 3.4*  CL 108 98*  CO2 24 27  GLUCOSE 115* 119*  BUN 17 12  CREATININE 1.10 1.06  CALCIUM 9.9 9.6   Liver Function Tests  Recent Labs  01/19/15 1736 01/19/15 2239  AST 25 24  ALT 24 21  ALKPHOS 34* 33*  BILITOT 0.5 0.6  PROT 7.1 6.6  ALBUMIN 4.9 4.3   Cardiac Enzymes  Recent Labs  01/19/15 1736 01/19/15 2239 01/20/15 0325  TROPONINI 0.32* 0.59* 0.64*   Fasting Lipid Panel  Recent Labs  01/20/15 0325  CHOL 165  HDL 28*  LDLCALC 75  TRIG 311*  CHOLHDL 5.9    TELE NSR without significant ventricular ectopy    ECG  No new EKG   Radiology/Studies  Dg Chest 2 View  01/19/2015   CLINICAL DATA:  Chest pain, chest pressure since last night  EXAM: CHEST  2 VIEW  COMPARISON:  None.  FINDINGS: The heart size and mediastinal contours are within normal limits. Both lungs are clear. The visualized skeletal structures are unremarkable.  IMPRESSION: No active cardiopulmonary disease.  Electronically Signed   By: Kathreen Devoid   On: 01/19/2015 17:49    ASSESSMENT AND PLAN  1. NSTEMI  - pending cath today. Risk and benefit of the procedure explained, he displayed clear understanding and agree to proceed.  - continue ASA, ACEI, amlodipine, statin.   2. CAD s/p BMS to RCA and LCx at [facility] 2002  3. Tobacco abuse: started on nicotine patch 4. HTN 5. HLD: trig 311, LDL 75. HDL 28 6. NIDDM  Signed, Almyra Deforest PA-C Pager: [phone]  Patient is for cardiac catheterization today.

## 2015-01-20 NOTE — Progress Notes (Signed)
Monitors not importing vital signs today post Hardin Negus upgrade.  Vitals missing from EPIC are in paper chart.

## 2015-01-20 NOTE — Progress Notes (Signed)
UR Completed Bunny Lowdermilk Graves-Bigelow, RN,BSN [phone]  

## 2015-01-20 NOTE — Care Management Note (Addendum)
Case Management Note  Patient Details  Name: Troy Boyer MRN: [id number] Date of Birth: Oct 10, 1950  Subjective/Objective:      Pt admitted for cp. Plan for cardiac cath 01-20-15.               Action/Plan: No needs identified by CM at this time. CM will continue to monitor.    Expected Discharge Date:  01/22/15               Expected Discharge Plan:  Home/Self Care  In-House Referral:  NA  Discharge planning Services  CM Consult  Post Acute Care Choice:  NA Choice offered to:  NA  DME Arranged:  N/A DME Agency:  NA  HH Arranged:  NA HH Agency:  NA  Status of Service:  In process, will continue to follow  Medicare Important Message Given:    Date Medicare IM Given:    Medicare IM give by:    Date Additional Medicare IM Given:    Additional Medicare Important Message give by:     If discussed at [place] of Stay Meetings, dates discussed:    Additional Comments:  Bethena Roys, RN 01/20/2015, 11:14 AM

## 2015-01-20 NOTE — H&P (View-Only) (Signed)
Patient Name: Troy Boyer Date of Encounter: 01/20/2015  Primary Cardiologist: Dr. Stanford Breed   Principal Problem:   NSTEMI (non-ST elevated myocardial infarction) Active Problems:   Hypertension   Hyperlipidemia   CAD (coronary artery disease)   Tobacco abuse   Non-insulin dependent type 2 diabetes mellitus    SUBJECTIVE  Denies any CP or SOB.   CURRENT MEDS . amLODipine  5 mg Oral Daily  . aspirin EC  81 mg Oral Daily  . atorvastatin  40 mg Oral Daily  . fenofibrate  160 mg Oral Daily  . metoprolol tartrate  25 mg Oral Daily  . multivitamin with minerals  1 tablet Oral Daily  . nicotine  14 mg Transdermal Daily  . ramipril  10 mg Oral BID  . sodium chloride  3 mL Intravenous Q12H    OBJECTIVE  Filed Vitals:   01/19/15 2045 01/19/15 2250 01/20/15 0220 01/20/15 0640  BP: 158/84 132/77 125/73 126/71  Pulse: 84 68 65 68  Temp: 98.4 F (36.9 C)  98.5 F (36.9 C)   TempSrc: Oral  Oral   Resp: 18 18 16    Height: 5\' 5"  (1.651 m)     Weight: 154 lb 8.7 oz (70.1 kg)   156 lb 12 oz (71.1 kg)  SpO2: 98% 97% 96% 95%    Intake/Output Summary (Last 24 hours) at 01/20/15 0856 Last data filed at 01/20/15 0000  Gross per 24 hour  Intake    291 ml  Output      0 ml  Net    291 ml   Filed Weights   01/19/15 2045 01/20/15 0640  Weight: 154 lb 8.7 oz (70.1 kg) 156 lb 12 oz (71.1 kg)    PHYSICAL EXAM  General: Pleasant, NAD. Neuro: Alert and oriented X 3. Moves all extremities spontaneously. Psych: Normal affect. HEENT:  Normal  Neck: Supple without bruits or JVD. Lungs:  Resp regular and unlabored, CTA. Heart: RRR no s3, s4, or murmurs. Abdomen: Soft, non-tender, non-distended, BS + x 4.  Extremities: No clubbing, cyanosis or edema. DP/PT/Radials 2+ and equal bilaterally.  Accessory Clinical Findings  CBC  Recent Labs  01/19/15 1736 01/20/15 0325  WBC 11.4* 9.3  HGB 17.0 15.6  HCT 50.7 47.8  MCV 89.6 91.7  PLT 334 650   Basic Metabolic  Panel  Recent Labs  01/19/15 1736 01/19/15 2239  NA 140 134*  K 4.2 3.4*  CL 108 98*  CO2 24 27  GLUCOSE 115* 119*  BUN 17 12  CREATININE 1.10 1.06  CALCIUM 9.9 9.6   Liver Function Tests  Recent Labs  01/19/15 1736 01/19/15 2239  AST 25 24  ALT 24 21  ALKPHOS 34* 33*  BILITOT 0.5 0.6  PROT 7.1 6.6  ALBUMIN 4.9 4.3   Cardiac Enzymes  Recent Labs  01/19/15 1736 01/19/15 2239 01/20/15 0325  TROPONINI 0.32* 0.59* 0.64*   Fasting Lipid Panel  Recent Labs  01/20/15 0325  CHOL 165  HDL 28*  LDLCALC 75  TRIG 311*  CHOLHDL 5.9    TELE NSR without significant ventricular ectopy    ECG  No new EKG   Radiology/Studies  Dg Chest 2 View  01/19/2015   CLINICAL DATA:  Chest pain, chest pressure since last night  EXAM: CHEST  2 VIEW  COMPARISON:  None.  FINDINGS: The heart size and mediastinal contours are within normal limits. Both lungs are clear. The visualized skeletal structures are unremarkable.  IMPRESSION: No active cardiopulmonary disease.  Electronically Signed   By: Kathreen Devoid   On: 01/19/2015 17:49    ASSESSMENT AND PLAN  1. NSTEMI  - pending cath today. Risk and benefit of the procedure explained, he displayed clear understanding and agree to proceed.  - continue ASA, ACEI, amlodipine, statin.   2. CAD s/p BMS to RCA and LCx at [facility] 2002  3. Tobacco abuse: started on nicotine patch 4. HTN 5. HLD: trig 311, LDL 75. HDL 28 6. NIDDM  Signed, Almyra Deforest PA-C Pager: [phone]  Patient is for cardiac catheterization today.

## 2015-01-20 NOTE — Plan of Care (Signed)
Problem: Consults Goal: Cardiac Cath Patient Education (See Patient Education module for education specifics.) Outcome: Progressing Cardiac Cath Pamphlet provided. Pt watched video

## 2015-01-21 ENCOUNTER — Encounter (HOSPITAL_COMMUNITY): Payer: Self-pay | Admitting: Interventional Cardiology

## 2015-01-21 ENCOUNTER — Other Ambulatory Visit: Payer: Self-pay | Admitting: *Deleted

## 2015-01-21 ENCOUNTER — Telehealth: Payer: Self-pay

## 2015-01-21 ENCOUNTER — Telehealth: Payer: Self-pay | Admitting: Cardiology

## 2015-01-21 LAB — CBC
HCT: 47.9 % (ref 39.0–52.0)
Hemoglobin: 15.7 g/dL (ref 13.0–17.0)
MCH: 30.1 pg (ref 26.0–34.0)
MCHC: 32.8 g/dL (ref 30.0–36.0)
MCV: 91.8 fL (ref 78.0–100.0)
Platelets: 279 10*3/uL (ref 150–400)
RBC: 5.22 MIL/uL (ref 4.22–5.81)
RDW: 14.3 % (ref 11.5–15.5)
WBC: 9.6 10*3/uL (ref 4.0–10.5)

## 2015-01-21 LAB — BASIC METABOLIC PANEL
ANION GAP: 8 (ref 5–15)
BUN: 10 mg/dL (ref 6–20)
CALCIUM: 8.8 mg/dL — AB (ref 8.9–10.3)
CO2: 24 mmol/L (ref 22–32)
Chloride: 105 mmol/L (ref 101–111)
Creatinine, Ser: 0.91 mg/dL (ref 0.61–1.24)
GFR calc Af Amer: >60 mL/min (ref >60)
GFR calc non Af Amer: >60 mL/min (ref >60)
Glucose, Bld: 94 mg/dL (ref 65–99)
Potassium: 3.8 mmol/L (ref 3.5–5.1)
Sodium: 137 mmol/L (ref 135–145)

## 2015-01-21 LAB — GLUCOSE, CAPILLARY: Glucose-Capillary: 102 mg/dL — ABNORMAL HIGH (ref 65–99)

## 2015-01-21 MED ORDER — NICOTINE 21 MG/24HR TD PT24: 21.0000 mg | MEDICATED_PATCH | Freq: Every day | TRANSDERMAL | Status: DC

## 2015-01-21 MED ORDER — AMBULATORY NON FORMULARY MEDICATION: 90.0000 mg | Freq: Two times a day (BID) | Status: DC

## 2015-01-21 MED ORDER — TICAGRELOR 90 MG PO TABS: 90.0000 mg | ORAL_TABLET | Freq: Two times a day (BID) | ORAL | Status: DC

## 2015-01-21 MED ORDER — AMBULATORY NON FORMULARY MEDICATION: 81.0000 mg | Freq: Every day | Status: DC

## 2015-01-21 MED ORDER — NICOTINE 14 MG/24HR TD PT24: 14.0000 mg | MEDICATED_PATCH | Freq: Every day | TRANSDERMAL | Status: DC

## 2015-01-21 NOTE — Discharge Summary (Signed)
Discharge Summary   Patient ID: Troy Boyer,  MRN: [id number], DOB/AGE: 1950-11-08 64 y.o.  Admit date: 01/19/2015 Discharge date: 01/21/2015  Primary Care Provider: [name] Primary Cardiologist: Dr. Stanford Breed  Discharge Diagnoses Principal Problem:   NSTEMI (non-ST elevated myocardial infarction) Active Problems:   Hypertension   Hyperlipidemia   CAD (coronary artery disease)   Tobacco abuse   Non-insulin dependent type 2 diabetes mellitus   Allergies No Known Allergies  Procedures  Cardiac catheterization 01/20/2015 Conclusion    1. Prox Cx lesion, 95% stenosed. 2. Prox RCA to Mid RCA lesion, 25% stenosed. The lesion was previously treated with a stent (unknown type) . 3. Mid RCA lesion, 55% stenosed. 4. Dist RCA lesion, 45% stenosed. 5. RPDA lesion, 30% stenosed. 6. Mid LAD lesion, 40% stenosed. 7. Prox Cx to Mid Cx lesion, 95% stenosed. There is a 0% residual stenosis post intervention. The lesion was previously treated with a stent (unknown type) . 8. A drug-eluting stent was placed. 9. The left ventricular systolic function is normal.   Unstable angina/non-ST elevation myocardial infarction related to severe in-stent restenosis involving the proximal circumflex bare-metal stent.  Successful redilatation and stenting of the proximal circumflex in-stent restenosis using a drug-eluting stent with reduction in 95% stenosis to 0% with TIMI grade 3 flow. Final balloon diameter 4.0 mm.  Widely patent proximal to mid right coronary stents with 50% stenosis just distal to the stent margin. This lesion was not felt to be angiographically significant.  Widely patent though diseased LAD, distal right coronary, and PDA.  Normal left ventricular systolic function with ejection fraction of 55%.  Recommendations:   Overnight stay  Dual antiplatelets therapy using Brilinta and aspirin for at least 6 months.  IV hydration.        Hospital Course  The  patient is a 64 year old male with PMH of CAD s/p BMS placed at [facility] in 2002 to RCA and LCx. He awakened around 2:30 AM on 01/19/2015 with midsternal chest discomfort similar to the previous angina. He took a nitroglycerin which eventually alleviated the pain. [place] he had recurrent chest discomfort and decided to seek medical attention at [facility] ED. Initial EKG was unremarkable. Repeat troponin was elevated. Given his symptom which was reminiscent of his previous angina, decision was made to proceed with cardiac catheterization.  He was admitted to cardiology service, serial troponin trended up to 0.64 before trending back down. Lipid panel shows triglycerides 311, HDL 28, LDL 75. He underwent cardiac catheterization on 7/25 which showed 95% prox LCx ISR treated with DES, 55% prox to mid RCA, 45% distal RCA, 40% midLAD, EF 55%. Post cath, he was started on Brilinta.  He was seen in the morning of 7/26, he ambulated with cardiac rehabilitation without any difficulty. He denies any recurrent chest discomfort or shortness of breath. His right radial cath site appears to be stable with good distal pulses. I have arranged seven-day transition of care follow-up. He is deemed stable for discharge from cardiology perspective. I have emphasized on compliance with dual antiplatelets therapy and tobacco cessation given the degree of his CAD.   Discharge Vitals Blood pressure 135/82, pulse 67, temperature 98.2 F (36.8 C), temperature source Oral, resp. rate 20, height 5\' 5"  (1.651 m), weight 154 lb 5.2 oz (70 kg), SpO2 97 %.  Filed Weights   01/19/15 2045 01/20/15 0640 01/21/15 0027  Weight: 154 lb 8.7 oz (70.1 kg) 156 lb  12 oz (71.1 kg) 154 lb 5.2 oz (70 kg)    Labs  CBC  Recent Labs  01/20/15 0325 01/21/15 0345  WBC 9.3 9.6  HGB 15.6 15.7  HCT 47.8 47.9  MCV 91.7 91.8  PLT 301 076   Basic Metabolic Panel  Recent Labs   01/19/15 2239 01/21/15 0345  NA 134* 137  K 3.4* 3.8  CL 98* 105  CO2 27 24  GLUCOSE 119* 94  BUN 12 10  CREATININE 1.06 0.91  CALCIUM 9.6 8.8*   Liver Function Tests  Recent Labs  01/19/15 1736 01/19/15 2239  AST 25 24  ALT 24 21  ALKPHOS 34* 33*  BILITOT 0.5 0.6  PROT 7.1 6.6  ALBUMIN 4.9 4.3   Cardiac Enzymes  Recent Labs  01/19/15 2239 01/20/15 0325 01/20/15 1008  TROPONINI 0.59* 0.64* 0.52*   Fasting Lipid Panel  Recent Labs  01/20/15 0325  CHOL 165  HDL 28*  LDLCALC 75  TRIG 311*  CHOLHDL 5.9    Disposition  Pt is being discharged home today in good condition.  Follow-up Plans & Appointments      Follow-up Information    Follow up with Richardson Dopp, PA-C On 01/28/2015.   Specialties:  Physician Assistant, Radiology, Interventional Cardiology   Why:  @8 :00am   Contact information:   [address] [address] [phone]       Discharge Medications    Medication List    TAKE these medications        amLODipine 5 MG tablet  Commonly known as:  NORVASC  TAKE 1 TABLET BY MOUTH DAILY     aspirin 81 MG tablet  Take 81 mg by mouth daily.     atorvastatin 40 MG tablet  Commonly known as:  LIPITOR  TAKE 1 TABLET BY MOUTH DAILY     fenofibrate 160 MG tablet  TAKE 1 TABLET BY MOUTH EVERY DAY     KRILL OIL PO  Take by mouth daily.     metFORMIN 500 MG tablet  Commonly known as:  GLUCOPHAGE  TAKE 1 TABLET(500 MG) BY MOUTH TWICE DAILY WITH A MEAL     metoprolol tartrate 25 MG tablet  Commonly known as:  LOPRESSOR  TAKE 1 TABLET BY MOUTH DAILY     multivitamin tablet  Take 1 tablet by mouth daily.     nicotine 14 mg/24hr patch  Commonly known as:  NICODERM CQ - dosed in mg/24 hours  Place 1 patch (14 mg total) onto the skin daily.     nitroGLYCERIN 0.4 MG SL tablet  Commonly known as:  NITROSTAT  Place 1 tablet (0.4 mg total) under the tongue every 5 (five) minutes as needed for chest pain.       ramipril 10 MG capsule  Commonly known as:  ALTACE  Take 1 capsule (10 mg total) by mouth 2 (two) times daily.     sildenafil 100 MG tablet  Commonly known as:  VIAGRA  Take 100 mg by mouth daily as needed for erectile dysfunction.     ticagrelor 90 MG Tabs tablet  Commonly known as:  BRILINTA  Take 1 tablet (90 mg total) by mouth 2 (two) times daily.     zolpidem 5 MG tablet  Commonly known as:  AMBIEN  Take 5 mg by mouth at bedtime as needed for sleep.       Duration of Discharge Encounter   Greater than 30 minutes including physician time.  Hilbert Corrigan PA-C Pager: [phone] 01/21/2015, 9:40 AM  Patient seen and examined. I agree with the assessment and plan as detailed above. See also my additional thoughts below.   Refer to the progress note also. I made the decision for discharge. The patient is stable to be discharged home. Dola Argyle, MD, [facility] 01/21/2015 9:52 AM

## 2015-01-21 NOTE — Telephone Encounter (Signed)
FORWARD TO  NL TOC POOL

## 2015-01-21 NOTE — Telephone Encounter (Signed)
Discussed with dr Stanford Breed, left message for [facility], pt will need to wait 30 days for dental work

## 2015-01-21 NOTE — Discharge Instructions (Signed)
°  Angina Pectoris Angina pectoris is extreme discomfort in your chest, neck, or arm. Your doctor may call it just angina. It is caused by a lack of oxygen to your heart wall. It may feel like tightness or heavy pressure. It may feel like a crushing or squeezing pain. Some people say it feels like gas. It may go down your shoulders, back, and arms. Some people have symptoms other than pain. These include:  Tiredness.  Shortness of breath.  Cold sweats.  Feeling sick to your stomach (nausea). There are four types of angina:  Stable angina. This type often lasts the same amount of time each time it happens. Activity, stress, or excitement can bring it on. It often gets better after taking a medicine called nitroglycerin. This goes under your tongue.  Unstable angina. This type can happen when you are not active or even during sleep. It can suddenly get worse or happen more often. It may not get better after taking the special medicine. It can last up to 30 minutes.  Microvascular angina. This type is more common in women. It may be more severe or last longer than other types.  Prinzmetal angina. This type often happens when you are not active or in the early morning hours. HOME CARE   Only take medicines as told by your doctor.  Stay active or exercise more as told by your doctor.  Limit very hard activity as told by your doctor.  Limit heavy lifting as told by your doctor.  Keep a healthy weight.  Learn about and eat foods that are healthy for your heart.  Do not use any tobacco such as cigarettes, chewing tobacco, or e-cigarettes. GET HELP RIGHT AWAY IF:   You have chest, neck, deep shoulder, or arm pain or discomfort that lasts more than a few minutes.  You have chest, neck, deep shoulder, or arm pain or discomfort that goes away and comes back over and over again.  You have heavy sweating that seems to happen for no reason.  You have shortness of breath or trouble  breathing.  Your angina does not get better after a few minutes of rest.  Your angina does not get better after you take nitroglycerin medicine. These can all be symptoms of a heart attack. Get help right away. Call your local emergency service (911 in [place] Do not  drive yourself to the hospital. Do not  wait to for your symptoms to go away. MAKE SURE YOU:   Understand these instructions.  Will watch your condition.  Will get help right away if you are not doing well or get worse. Document Released: 12/01/2007 Document Revised: 06/19/2013 Document Reviewed: 10/16/2013 [facility] Patient Information 2015 [place], [place]. This information is not intended to replace advice given to you by your health care provider. Make sure you discuss any questions you have with your health care provider.  No driving for 24 hours. No lifting over 5 lbs for 1 week. No sexual activity for 1 week.Marland Kitchen Keep procedure site clean & dry. If you notice increased pain, swelling, bleeding or pus, call/return!  You may shower, but no soaking baths/hot tubs/pools for 1 week.

## 2015-01-21 NOTE — Progress Notes (Signed)
CARDIAC REHAB PHASE I   PRE:  Rate/Rhythm: 38 SR    BP: sitting 135/82    SaO2:   MODE:  Ambulation: 1000 ft   POST:  Rate/Rhythm: 78 SR    BP: sitting 137/77     SaO2:   Tolerated well, no c/o except back pain and tobacco craving. Ed completed. Pt plans to use nicotine patch and will go buy one today. Interested in [facility] and will send referral to [employer]. Understands importance of Brilinta. [phone]   Darrick Meigs CES, ACSM 01/21/2015 9:04 AM

## 2015-01-21 NOTE — Telephone Encounter (Signed)
Patient contacted regarding discharge from [facility] on 01/21/2015.  Patient understands to follow up with provider Richardson Dopp on 01/28/2015 at 8:00am at [facility]. Patient understands discharge instructions? yes  Patient understands medications and regiment? yes  Patient understands to bring all medications to this visit? yes   Spoke with patient and he states he is doing well and would like a strong dose in his nicotine patch, spoke Dr Stanford Breed and a stronger rx will be sent to pharmacy, patient will be notified.

## 2015-01-21 NOTE — Telephone Encounter (Signed)
Spoke with pt, AWARE HIGHER DOSE SENT TO THE PHARMACY.

## 2015-01-21 NOTE — Telephone Encounter (Signed)
Okay to increase dose of NicoDerm patch. Kirk Ruths

## 2015-01-21 NOTE — Telephone Encounter (Signed)
New message    TCM appt on 8.2.2016 with Richardson Dopp per [facility]

## 2015-01-21 NOTE — Progress Notes (Signed)
Patient Name: Troy Boyer Date of Encounter: 01/21/2015   Primary Cardiologist: Dr. Stanford Breed   Principal Problem:   NSTEMI (non-ST elevated myocardial infarction) Active Problems:   Hypertension   Hyperlipidemia   CAD (coronary artery disease)   Tobacco abuse   Non-insulin dependent type 2 diabetes mellitus    SUBJECTIVE  Denies any CP or SOB.   CURRENT MEDS . amLODipine  5 mg Oral Daily  . aspirin  81 mg Oral Daily  . atorvastatin  80 mg Oral q1800  . fenofibrate  160 mg Oral Daily  . metoprolol tartrate  25 mg Oral Daily  . multivitamin with minerals  1 tablet Oral Daily  . nicotine  14 mg Transdermal Daily  . ramipril  10 mg Oral BID  . sodium chloride  3 mL Intravenous Q12H  . ticagrelor  90 mg Oral BID    OBJECTIVE  Filed Vitals:   01/20/15 2045 01/21/15 0027 01/21/15 0656 01/21/15 0802  BP: 129/64 122/77 135/81 135/82  Pulse: 74 60 62 67  Temp: 98 F (36.7 C) 98.5 F (36.9 C) 98 F (36.7 C) 98.2 F (36.8 C)  TempSrc: Oral Oral Oral Oral  Resp: 17 16 18 20   Height:      Weight:  154 lb 5.2 oz (70 kg)    SpO2: 99% 98% 100% 97%    Intake/Output Summary (Last 24 hours) at 01/21/15 0908 Last data filed at 01/21/15 0803  Gross per 24 hour  Intake 1395.65 ml  Output   2100 ml  Net -704.35 ml   Filed Weights   01/19/15 2045 01/20/15 0640 01/21/15 0027  Weight: 154 lb 8.7 oz (70.1 kg) 156 lb 12 oz (71.1 kg) 154 lb 5.2 oz (70 kg)    PHYSICAL EXAM  General: Pleasant, NAD. Neuro: Alert and oriented X 3. Moves all extremities spontaneously. Psych: Normal affect. HEENT:  Normal  Neck: Supple without bruits or JVD. Lungs:  Resp regular and unlabored, CTA. Heart: RRR no s3, s4, or murmurs. R radial cath site stable.  Abdomen: Soft, non-tender, non-distended, BS + x 4.  Extremities: No clubbing, cyanosis or edema. DP/PT/Radials 2+ and equal bilaterally.  Accessory Clinical Findings  CBC  Recent Labs  01/20/15 0325 01/21/15 0345  WBC 9.3  9.6  HGB 15.6 15.7  HCT 47.8 47.9  MCV 91.7 91.8  PLT 301 765   Basic Metabolic Panel  Recent Labs  01/19/15 2239 01/21/15 0345  NA 134* 137  K 3.4* 3.8  CL 98* 105  CO2 27 24  GLUCOSE 119* 94  BUN 12 10  CREATININE 1.06 0.91  CALCIUM 9.6 8.8*   Liver Function Tests  Recent Labs  01/19/15 1736 01/19/15 2239  AST 25 24  ALT 24 21  ALKPHOS 34* 33*  BILITOT 0.5 0.6  PROT 7.1 6.6  ALBUMIN 4.9 4.3   Cardiac Enzymes  Recent Labs  01/19/15 2239 01/20/15 0325 01/20/15 1008  TROPONINI 0.59* 0.64* 0.52*   Fasting Lipid Panel  Recent Labs  01/20/15 0325  CHOL 165  HDL 28*  LDLCALC 75  TRIG 311*  CHOLHDL 5.9    TELE NSR without significant ventricular ectopy    ECG  No new EKG   Radiology/Studies  Dg Chest 2 View  01/19/2015   CLINICAL DATA:  Chest pain, chest pressure since last night  EXAM: CHEST  2 VIEW  COMPARISON:  None.  FINDINGS: The heart size and mediastinal contours are within normal limits. Both lungs are clear. The visualized  skeletal structures are unremarkable.  IMPRESSION: No active cardiopulmonary disease.   Electronically Signed   By: Kathreen Devoid   On: 01/19/2015 17:49    ASSESSMENT AND PLAN  1. NSTEMI - cath 01/20/2015 95% prox LCx ISR treated with DES, 55% prox to mid RCA, 45% distal RCA, 40% midLAD, EF 55%  - continue ASA, ACEI, BB, amlodipine, statin and brilinta.  - discussed with Dr. Ron Parker, stable for discharge.   2. CAD s/p BMS to RCA and LCx at [facility] 2002  3. Tobacco abuse: started on nicotine patch. Discussed tobacco cessation 4. HTN 5. HLD: trig 311, LDL 75. HDL 28 6. NIDDM   Signed, Almyra Deforest PA-C Pager: [phone] Patient seen and examined. I agree with the assessment and plan as detailed above. See also my additional thoughts below.   The patient is stable today. He is ready for discharge. We will arrange the discharge.  Dola Argyle, MD, [facility] 01/21/2015 9:22 AM

## 2015-01-21 NOTE — Research (Signed)
TWILIGHT Informed Consent   Subject Name: Troy Boyer  Subject met inclusion and exclusion criteria.  The informed consent form, study requirements and expectations were reviewed with the subject and questions and concerns were addressed prior to the signing of the consent form.  The subject verbalized understanding of the trail requirements.  The subject agreed to participate in the TWILIGHT trial and signed the informed consent.  The informed consent was obtained prior to performance of any protocol-specific procedures for the subject.  A copy of the signed informed consent was given to the subject and a copy was placed in the subject's medical record.  Hedrick,Christelle Igoe W 01/21/2015, [id number]

## 2015-01-21 NOTE — Telephone Encounter (Signed)
Patient called in stating that he was taken to [facility].  While there he was given a Nicoderm Patch 14 mg.  He states that it was not strong enough to curb his urges.  He also states that he was told he needed a 24 mg patch to help him stop smoking.  Is this something that we can do?  Please advise.

## 2015-01-21 NOTE — Telephone Encounter (Signed)
Patient states he had stents placed over the weekend and his dentist wants to know how soon this patient can reschedule his dental appointment.

## 2015-01-22 ENCOUNTER — Telehealth: Payer: Self-pay | Admitting: Family

## 2015-01-22 MED ORDER — ZOLPIDEM TARTRATE 5 MG PO TABS: 5.0000 mg | ORAL_TABLET | Freq: Every evening | ORAL | Status: DC | PRN

## 2015-01-22 NOTE — Telephone Encounter (Signed)
Verified pharmacy is [facility] on [address]. Patient requesting a fill of zolpidem (AMBIEN) 5 MG tablet [[id number]] . We have not filled this before

## 2015-01-22 NOTE — Telephone Encounter (Signed)
Medication printed and faxed.  

## 2015-01-23 ENCOUNTER — Other Ambulatory Visit: Payer: Self-pay

## 2015-01-23 ENCOUNTER — Other Ambulatory Visit (INDEPENDENT_AMBULATORY_CARE_PROVIDER_SITE_OTHER): Payer: PRIVATE HEALTH INSURANCE

## 2015-01-23 DIAGNOSIS — E785 Hyperlipidemia, unspecified: Secondary | ICD-10-CM

## 2015-01-23 DIAGNOSIS — R7301 Impaired fasting glucose: Secondary | ICD-10-CM | POA: Diagnosis not present

## 2015-01-23 LAB — HEMOGLOBIN A1C: Hgb A1c MFr Bld: 6 % (ref 4.6–6.5)

## 2015-01-23 LAB — LIPID PANEL
Cholesterol: 181 mg/dL (ref 0–200)
HDL: 36.7 mg/dL — ABNORMAL LOW (ref >39.00)
NONHDL: 144.42
Total CHOL/HDL Ratio: 5
Triglycerides: 245 mg/dL — ABNORMAL HIGH (ref 0.0–149.0)
VLDL: 49 mg/dL — ABNORMAL HIGH (ref 0.0–40.0)

## 2015-01-23 LAB — LDL CHOLESTEROL, DIRECT: Direct LDL: 116 mg/dL

## 2015-01-24 ENCOUNTER — Encounter: Payer: Self-pay | Admitting: Family

## 2015-01-27 NOTE — Progress Notes (Signed)
Cardiology Office Note   Date:  01/28/2015   ID:  Troy Boyer, DOB [birth year], MRN [id number]  PCP:  [name], [place]  Cardiologist:  Dr. Kirk Ruths   Electrophysiologist:  n/a  Chief Complaint  Patient presents with  . Hospitalization Follow-up    s/p NSTEMI >> DES to LCx  . Coronary Artery Disease     History of Present Illness: Troy Boyer is a 64 y.o. male with a hx of CAD.  Previously followed in [facility]. Patient had a non-ST elevation myocardial infarction in 2002. Cardiac catheterization revealed an 80% proximal right coronary artery followed by an 85%. There was a 90% circumflex. LV function normal. Patient had PCI of his circumflex and right coronary artery. Nuclear study in July of 2012 showed an ejection fraction of 76%. No ischemia.  Last seen by Dr. Kirk Ruths 3/16.  Admitted 7/24-7/26 with a NSTEMI.  LHC demonstrated pCFX 95% ISR which was treated with a DES.  Residual CAD included mLAD 40%, pRCA stent ok with 25% ISR, mRCA 55%, dRCA 45%, RPDA 30%. EF was preserved.    He returns for FU.  Doing well since DC.  The patient denies chest pain, shortness of breath, syncope, orthopnea, PND or significant pedal edema.  He has quit smoking.     Studies/Reports Reviewed Today:  LHC/PCI 01/20/15 LAD:  Mid 40% LCx: prox 95%, prox to mid 95% ISR RCA:  prox to mid stent ok with 25% ISR, mid 55%, dist 45%, RPDA 30%  The left ventricular ejection fraction is 55-65% by visual estimate. PCI:  Promus Premier DES to prox to mid LCx  [place]/non-ST elevation myocardial infarction related to severe in-stent restenosis involving the proximal LCx BMS.  Successful redilatation and stenting of the pLCx ISR using a DES with reduction in 95% stenosis to 0% with TIMI grade 3 flow. Final balloon diameter 4.0 mm.  Widely patent proximal to mid right coronary stents with 50% stenosis just distal to the stent margin. This lesion was not felt to be angiographically  significant.  Widely patent though diseased LAD, distal right coronary, and PDA.  Normal left ventricular systolic function with ejection fraction of 55%.   Myoview 7/12 No ischemia, EF 76%  Past Medical History  Diagnosis Date  . Hyperlipidemia   . Hypertension   . Erectile dysfunction   . Diabetes mellitus without complication   . CAD (coronary artery disease)     a. Cath 06/08/01 at [facility] with normal LM and LAD, 95% LCx s/p Circumflex stent 4.0 x 15 mm Penta and 85% and 80% prox RCA s/p 3.5 x 23 mm Penta b. cath 01/20/2015 95% prox LCx ISR treated with DES, 55% prox to mid RCA, 45% distal RCA, 40% midLAD, EF 55%    Past Surgical History  Procedure Laterality Date  . Angioplasty  2002    2 stents  . Rotator cuff repair Right 2009  . Trigger finger release  2013    4 fingers  . Rotator cuff repair Left 2014  . Coronary angioplasty with stent placement    . Cardiac catheterization N/A 01/20/2015    Procedure: Left Heart Cath and Coronary Angiography;  Surgeon: Belva Crome, MD;  Location: [facility];  Service: Cardiovascular;  Laterality: N/A;     Current Outpatient Prescriptions  Medication Sig Dispense Refill  . AMBULATORY NON FORMULARY MEDICATION Take 90 mg by mouth 2 (two) times daily. Medication Name: Brilinta 90 mg BID (provided by TWILIGHT Study)    .  AMBULATORY NON FORMULARY MEDICATION Take 81 mg by mouth daily. Medication Name: Aspirin 81 mg daily (provided by TWILIGHT study)    . amLODipine (NORVASC) 5 MG tablet TAKE 1 TABLET BY MOUTH DAILY 90 tablet 2  . atorvastatin (LIPITOR) 40 MG tablet TAKE 1 TABLET BY MOUTH DAILY 90 tablet 2  . fenofibrate 160 MG tablet TAKE 1 TABLET BY MOUTH EVERY DAY 90 tablet 1  . KRILL OIL PO Take by mouth daily.    . metoprolol tartrate (LOPRESSOR) 25 MG tablet TAKE 1 TABLET BY MOUTH DAILY 90 tablet 2  . Multiple Vitamin (MULTIVITAMIN) tablet Take 1 tablet by mouth daily.    . nicotine (NICODERM CQ - DOSED IN MG/24 HOURS)  21 mg/24hr patch Place 1 patch (21 mg total) onto the skin daily. 14 patch 0  . nitroGLYCERIN (NITROSTAT) 0.4 MG SL tablet Place 1 tablet (0.4 mg total) under the tongue every 5 (five) minutes as needed for chest pain. 25 tablet 3  . ramipril (ALTACE) 10 MG capsule Take 1 capsule (10 mg total) by mouth 2 (two) times daily. 60 capsule 3  . sildenafil (VIAGRA) 100 MG tablet Take 100 mg by mouth daily as needed for erectile dysfunction.    Marland Kitchen zolpidem (AMBIEN) 5 MG tablet Take 1 tablet (5 mg total) by mouth at bedtime as needed for sleep. 30 tablet 0   No current facility-administered medications for this visit.    Allergies:   Review of patient's allergies indicates no known allergies.    Social History:  The patient  reports that he has been smoking Cigarettes.  He has a 44 pack-year smoking history. He has never used smokeless tobacco. He reports that he drinks alcohol. He reports that he does not use illicit drugs.   Family History:  The patient's family history includes CAD in his brother and sister; Heart disease in his father. There is no history of Colon cancer.    ROS:   Please see the history of present illness.   Review of Systems  All other systems reviewed and are negative.    PHYSICAL EXAM: VS:  BP 102/60 mmHg  Pulse 66  Ht 5\' 5"  (1.651 m)  Wt 158 lb 12.8 oz (72.031 kg)  BMI 26.43 kg/m2    Wt Readings from Last 3 Encounters:  01/28/15 158 lb 12.8 oz (72.031 kg)  01/21/15 154 lb 5.2 oz (70 kg)  10/21/14 172 lb 12.8 oz (78.382 kg)     GEN: Well nourished, well developed, in no acute distress HEENT: normal Neck: no JVD,  no masses Cardiac:  Normal S1/S2, RRR; no murmur ,  no rubs or gallops, no edema; right wrist without hematoma or mass  Respiratory:  clear to auscultation bilaterally, no wheezing, rhonchi or rales. GI: soft, nontender, nondistended, + BS MS: no deformity or atrophy Skin: warm and dry  Neuro:  CNs II-XII intact, Strength and sensation are  intact Psych: Normal affect   EKG:  EKG is ordered today.  It demonstrates:  NSR, HR 66, LAD, no change from prior tracing   Recent Labs: 01/19/2015: ALT 21 01/21/2015: BUN 10; Creatinine, Ser 0.91; Hemoglobin 15.7; Platelets 279; Potassium 3.8; Sodium 137    Lipid Panel    Component Value Date/Time   CHOL 181 01/23/2015 1406   TRIG 245.0* 01/23/2015 1406   HDL 36.70* 01/23/2015 1406   CHOLHDL 5 01/23/2015 1406   VLDL 49.0* 01/23/2015 1406   LDLCALC 75 01/20/2015 0325   LDLDIRECT 116.0 01/23/2015 1406  ASSESSMENT AND PLAN:  Coronary artery disease involving native coronary artery of native heart without angina pectoris:  Patient is doing well post non-STEMI secondary to significant LCx in-stent restenosis treated with a DES. He is enrolled in the TWILIGHT study. He understands the importance of taking Brilinta and aspirin for the next 12 months. He thinks he might be interested in cardiac rehabilitation. I will refer him to [facility]. Continue aspirin, Brilinta, statin, beta blocker, ACE inhibitor.  Essential hypertension:  Controlled.  Hyperlipidemia:  Continue statin. LDL recently was 75. Repeat direct LDL 116. Continue to monitor for now. If LDL remains greater than 70, increase Lipitor to 80 mg.  Tobacco abuse: He has quit smoking.      Medication Changes: Current medicines are reviewed at length with the patient today.  Concerns regarding medicines are as outlined above.  The following changes have been made:   Discontinued Medications   METFORMIN (GLUCOPHAGE) 500 MG TABLET    TAKE 1 TABLET(500 MG) BY MOUTH TWICE DAILY WITH A MEAL   Modified Medications   No medications on file   New Prescriptions   No medications on file    Labs/ tests ordered today include:   No orders of the defined types were placed in this encounter.     Disposition:   FU with Dr. Stanford Breed 6-8 weeks.   Signed, Versie Starks, MHS 01/28/2015 9:12 PM    [facility] [address], [place], [place]  [address] Phone: [phone]; Fax: [phone]

## 2015-01-28 ENCOUNTER — Ambulatory Visit (INDEPENDENT_AMBULATORY_CARE_PROVIDER_SITE_OTHER): Payer: PRIVATE HEALTH INSURANCE | Admitting: Physician Assistant

## 2015-01-28 ENCOUNTER — Encounter: Payer: Self-pay | Admitting: Physician Assistant

## 2015-01-28 VITALS — BP 102/60 | HR 66 | Ht 65.0 in | Wt 158.8 lb

## 2015-01-28 DIAGNOSIS — I10 Essential (primary) hypertension: Secondary | ICD-10-CM | POA: Diagnosis not present

## 2015-01-28 DIAGNOSIS — E785 Hyperlipidemia, unspecified: Secondary | ICD-10-CM

## 2015-01-28 DIAGNOSIS — Z720 Tobacco use: Secondary | ICD-10-CM | POA: Diagnosis not present

## 2015-01-28 DIAGNOSIS — I2510 Atherosclerotic heart disease of native coronary artery without angina pectoris: Secondary | ICD-10-CM | POA: Diagnosis not present

## 2015-01-28 NOTE — Patient Instructions (Signed)
Medication Instructions:  Your physician recommends that you continue on your current medications as directed. Please refer to the Current Medication list given to you today.   Labwork: None  Testing/Procedures: None  Follow-Up: Your physician wants you to follow-up in: 6/8 weeks with Dr. Stanford Breed.  Any Other Special Instructions Will Be Listed Below (If Applicable). Cardiac Rehab in process, you will be contacted by cardiac rehab, if you dont hear from them within a week, please give them a call. [phone]

## 2015-01-30 ENCOUNTER — Telehealth: Payer: Self-pay | Admitting: *Deleted

## 2015-01-30 NOTE — Telephone Encounter (Signed)
Cardiac rehab orders faxed.

## 2015-02-03 ENCOUNTER — Other Ambulatory Visit: Payer: Self-pay | Admitting: Cardiology

## 2015-02-04 ENCOUNTER — Other Ambulatory Visit: Payer: Self-pay | Admitting: *Deleted

## 2015-02-04 ENCOUNTER — Encounter: Payer: Self-pay | Admitting: Cardiology

## 2015-02-04 MED ORDER — NICOTINE 14 MG/24HR TD PT24: 14.0000 mg | MEDICATED_PATCH | Freq: Every day | TRANSDERMAL | Status: DC

## 2015-02-04 MED ORDER — NICOTINE 21 MG/24HR TD PT24: 21.0000 mg | MEDICATED_PATCH | Freq: Every day | TRANSDERMAL | Status: DC

## 2015-02-04 MED ORDER — NICOTINE 7 MG/24HR TD PT24: 7.0000 mg | MEDICATED_PATCH | Freq: Every day | TRANSDERMAL | Status: DC

## 2015-02-04 NOTE — Telephone Encounter (Signed)
°  1. Which medications need to be refilled? Nicotine patch   2. Which pharmacy is medication to be sent to?[facility] in [place] in [address]   3. Do they need a 30 day or 90 day supply? N/A  4. Would they like a call back once the medication has been sent to the pharmacy? Yes

## 2015-02-26 ENCOUNTER — Encounter: Payer: Self-pay | Admitting: *Deleted

## 2015-02-26 DIAGNOSIS — Z006 Encounter for examination for normal comparison and control in clinical research program: Secondary | ICD-10-CM

## 2015-02-26 NOTE — Progress Notes (Signed)
TWILIGHT Research Study 1 month follow up visit completed. Patient 100% compliant with Study medication with no adverse reactions.

## 2015-03-11 ENCOUNTER — Encounter: Payer: Self-pay | Admitting: Cardiology

## 2015-03-11 ENCOUNTER — Other Ambulatory Visit: Payer: Self-pay | Admitting: Cardiology

## 2015-03-18 NOTE — Progress Notes (Signed)
HPI: FU CAD. Previously followed in [facility]. Patient had a non-ST elevation myocardial infarction in 2002. Cardiac catheterization revealed an 80% proximal right coronary artery followed by an 85%. There was a 90% circumflex. LV function normal. Patient had PCI of his circumflex and right coronary artery. Nuclear study in July of 2012 showed an ejection fraction of 76%. No ischemia.  Admitted 7/16 with a NSTEMI. LHC demonstrated pCFX 95% ISR which was treated with a DES. Residual CAD included mLAD 40%, pRCA stent ok with 25% ISR, mRCA 55%, dRCA 45%, RPDA 30%. EF was preserved.   Since last seen,  the patient denies any dyspnea on exertion, orthopnea, PND, pedal edema, palpitations, syncope or chest pain.    Studies/Reports Reviewed Today:  LHC/PCI 01/20/15 LAD: Mid 40% LCx: prox 95%, prox to mid 95% ISR RCA: prox to mid stent ok with 25% ISR, mid 55%, dist 45%, RPDA 30%  The left ventricular ejection fraction is 55-65% by visual estimate. PCI: Promus Premier DES to prox to mid LCx  [place]/non-ST elevation myocardial infarction related to severe in-stent restenosis involving the proximal LCx BMS.  Successful redilatation and stenting of the pLCx ISR using a DES with reduction in 95% stenosis to 0% with TIMI grade 3 flow. Final balloon diameter 4.0 mm.  Widely patent proximal to mid right coronary stents with 50% stenosis just distal to the stent margin. This lesion was not felt to be angiographically significant.  Widely patent though diseased LAD, distal right coronary, and PDA.  Normal left ventricular systolic function with ejection fraction of 55%.    Current Outpatient Prescriptions  Medication Sig Dispense Refill  . AMBULATORY NON FORMULARY MEDICATION Take 90 mg by mouth 2 (two) times daily. Medication Name: Brilinta 90 mg BID (provided by TWILIGHT Study)    . AMBULATORY NON FORMULARY MEDICATION Take 81 mg by mouth daily. Medication Name: Aspirin 81 mg daily  (provided by TWILIGHT study)    . amLODipine (NORVASC) 5 MG tablet TAKE 1 TABLET BY MOUTH DAILY 90 tablet 2  . atorvastatin (LIPITOR) 40 MG tablet TAKE 1 TABLET BY MOUTH DAILY 90 tablet 2  . fenofibrate 160 MG tablet TAKE 1 TABLET BY MOUTH EVERY DAY 90 tablet 1  . KRILL OIL PO Take by mouth daily.    . metoprolol tartrate (LOPRESSOR) 25 MG tablet TAKE 1 TABLET BY MOUTH DAILY 90 tablet 2  . nicotine (NICODERM CQ) 14 mg/24hr patch Place 1 patch (14 mg total) onto the skin daily. 30 patch 0  . nicotine (NICODERM CQ) 7 mg/24hr patch Place 1 patch (7 mg total) onto the skin daily. 28 patch 0  . nitroGLYCERIN (NITROSTAT) 0.4 MG SL tablet Place 1 tablet (0.4 mg total) under the tongue every 5 (five) minutes as needed for chest pain. 25 tablet 3  . ramipril (ALTACE) 10 MG capsule TAKE ONE CAPSULE BY MOUTH TWICE DAILY 60 capsule 0  . sildenafil (VIAGRA) 100 MG tablet Take 100 mg by mouth daily as needed for erectile dysfunction.    Marland Kitchen zolpidem (AMBIEN) 5 MG tablet Take 1 tablet (5 mg total) by mouth at bedtime as needed for sleep. 30 tablet 0   No current facility-administered medications for this visit.     Past Medical History  Diagnosis Date  . Hyperlipidemia   . Hypertension   . Erectile dysfunction   . Diabetes mellitus without complication   . CAD (coronary artery disease)     a. Cath 06/08/01 at [facility] with  normal LM and LAD, 95% LCx s/p Circumflex stent 4.0 x 15 mm Penta and 85% and 80% prox RCA s/p 3.5 x 23 mm Penta b. cath 01/20/2015 95% prox LCx ISR treated with DES, 55% prox to mid RCA, 45% distal RCA, 40% midLAD, EF 55%    Past Surgical History  Procedure Laterality Date  . Angioplasty  2002    2 stents  . Rotator cuff repair Right 2009  . Trigger finger release  2013    4 fingers  . Rotator cuff repair Left 2014  . Coronary angioplasty with stent placement    . Cardiac catheterization N/A 01/20/2015    Procedure: Left Heart Cath and Coronary Angiography;  Surgeon: Belva Crome, MD;  Location: [facility];  Service: Cardiovascular;  Laterality: N/A;    Social History   Social History  . Marital Status: Significant Other    Spouse Name: N/A  . Number of Children: 2  . Years of Education: 18   Occupational History  . Sales    Social History Main Topics  . Smoking status: Current Every Day Smoker -- 1.00 packs/day for 44 years    Types: Cigarettes  . Smokeless tobacco: Never Used  . Alcohol Use: Yes     Comment: occasionally  . Drug Use: No  . Sexual Activity: Yes   Other Topics Concern  . Not on file   Social History Narrative   Fun: Boat, golf   Denies religious beliefs effecting health care.     ROS: no fevers or chills, productive cough, hemoptysis, dysphasia, odynophagia, melena, hematochezia, dysuria, hematuria, rash, seizure activity, orthopnea, PND, pedal edema, claudication. Remaining systems are negative.  Physical Exam: Well-developed well-nourished in no acute distress.  Skin is warm and dry.  HEENT is normal.  Neck is supple.  Chest is clear to auscultation with normal expansion.  Cardiovascular exam is regular rate and rhythm.  Abdominal exam nontender or distended. No masses palpated. Extremities show no edema. neuro grossly intact

## 2015-03-19 ENCOUNTER — Ambulatory Visit (INDEPENDENT_AMBULATORY_CARE_PROVIDER_SITE_OTHER): Payer: PRIVATE HEALTH INSURANCE | Admitting: Cardiology

## 2015-03-19 ENCOUNTER — Encounter: Payer: Self-pay | Admitting: Cardiology

## 2015-03-19 VITALS — BP 131/85 | HR 74 | Ht 65.0 in | Wt 161.4 lb

## 2015-03-19 DIAGNOSIS — Z720 Tobacco use: Secondary | ICD-10-CM | POA: Diagnosis not present

## 2015-03-19 DIAGNOSIS — I2583 Coronary atherosclerosis due to lipid rich plaque: Principal | ICD-10-CM

## 2015-03-19 DIAGNOSIS — I2510 Atherosclerotic heart disease of native coronary artery without angina pectoris: Secondary | ICD-10-CM

## 2015-03-19 MED ORDER — SILDENAFIL CITRATE 100 MG PO TABS: 100.0000 mg | ORAL_TABLET | Freq: Every day | ORAL | Status: DC | PRN

## 2015-03-19 NOTE — Assessment & Plan Note (Signed)
Patient has discontinued. 

## 2015-03-19 NOTE — Assessment & Plan Note (Signed)
Continue statin. 

## 2015-03-19 NOTE — Assessment & Plan Note (Signed)
Continue aspirin, brilinta and statin. 

## 2015-03-19 NOTE — Assessment & Plan Note (Signed)
Blood pressure controlled. Continue present medications. 

## 2015-03-19 NOTE — Patient Instructions (Signed)
Your physician wants you to follow-up in: 6 MONTHS WITH DR CRENSHAW You will receive a reminder letter in the mail two months in advance. If you don't receive a letter, please call our office to schedule the follow-up appointment.  

## 2015-04-04 ENCOUNTER — Other Ambulatory Visit: Payer: Self-pay | Admitting: Cardiology

## 2015-04-12 ENCOUNTER — Other Ambulatory Visit: Payer: Self-pay | Admitting: Cardiology

## 2015-04-22 ENCOUNTER — Encounter: Payer: Self-pay | Admitting: *Deleted

## 2015-04-22 ENCOUNTER — Other Ambulatory Visit: Payer: Self-pay | Admitting: *Deleted

## 2015-04-22 DIAGNOSIS — Z006 Encounter for examination for normal comparison and control in clinical research program: Secondary | ICD-10-CM

## 2015-04-22 MED ORDER — AMBULATORY NON FORMULARY MEDICATION: 81.0000 mg | Freq: Every day | Status: DC

## 2015-04-22 NOTE — Progress Notes (Signed)
TWILIGHT 3 month research randomization visit completed. No adverse or bleeding events per patient. Dispensed bottle # [id number]; [id number]; [id number]. Questions encouraged and answered.

## 2015-04-24 ENCOUNTER — Ambulatory Visit (INDEPENDENT_AMBULATORY_CARE_PROVIDER_SITE_OTHER): Payer: PRIVATE HEALTH INSURANCE | Admitting: Family

## 2015-04-24 ENCOUNTER — Encounter: Payer: Self-pay | Admitting: Family

## 2015-04-24 ENCOUNTER — Other Ambulatory Visit (INDEPENDENT_AMBULATORY_CARE_PROVIDER_SITE_OTHER): Payer: PRIVATE HEALTH INSURANCE

## 2015-04-24 VITALS — BP 110/76 | HR 80 | Temp 98.5°F | Ht 65.0 in | Wt 163.0 lb

## 2015-04-24 DIAGNOSIS — E119 Type 2 diabetes mellitus without complications: Secondary | ICD-10-CM

## 2015-04-24 DIAGNOSIS — E785 Hyperlipidemia, unspecified: Secondary | ICD-10-CM

## 2015-04-24 DIAGNOSIS — Z23 Encounter for immunization: Secondary | ICD-10-CM

## 2015-04-24 LAB — LIPID PANEL
CHOL/HDL RATIO: 5
Cholesterol: 205 mg/dL — ABNORMAL HIGH (ref 0–200)
HDL: 40.5 mg/dL (ref >39.00)
LDL Cholesterol: 124 mg/dL — ABNORMAL HIGH (ref 0–99)
NonHDL: 164
TRIGLYCERIDES: 200 mg/dL — AB (ref 0.0–149.0)
VLDL: 40 mg/dL (ref 0.0–40.0)

## 2015-04-24 LAB — MICROALBUMIN / CREATININE URINE RATIO
CREATININE, U: 213.8 mg/dL
MICROALB UR: 0.9 mg/dL (ref 0.0–1.9)
MICROALB/CREAT RATIO: 0.4 mg/g (ref 0.0–30.0)

## 2015-04-24 LAB — HEMOGLOBIN A1C: Hgb A1c MFr Bld: 6.5 % (ref 4.6–6.5)

## 2015-04-24 NOTE — Assessment & Plan Note (Signed)
Hyperlipidemia currently maintained on atorvastatin and fenofibrate with no adverse side effects. Obtain lipid profile to determine current status. Continue current dosage of atorvastatin and fenofibrate pending lab results.

## 2015-04-24 NOTE — Patient Instructions (Addendum)
Thank you for choosing [facility].  Summary/Instructions:  Your prescription(s) have been submitted to your pharmacy or been printed and provided for you. Please take as directed and contact our office if you believe you are having problem(s) with the medication(s) or have any questions.  Please stop by the lab on the basement level of the building for your blood work. Your results will be released to MyChart (or called to you) after review, usually within 72 hours after test completion. If any changes need to be made, you will be notified at that same time.   

## 2015-04-24 NOTE — Assessment & Plan Note (Signed)
Diabetes is well controlled with lifestyle management and previous A1c of 6.0. Denies hypoglycemic events. Maintained on [place]apo and atorvastatin for CAD risk reduction. Eye exam is up-to-date. Foot exam completed today. Declines Pneumovax. Obtain A1c and microalbumin. Follow-up in 6 months or sooner pending A1c results.

## 2015-04-24 NOTE — Progress Notes (Signed)
Subjective:    Patient ID: Troy Boyer, male    DOB: 1951/06/10, 64 y.o.   MRN: [id number]  Chief Complaint  Patient presents with  . Follow-up    Pre-diabetes    HPI:  Troy Boyer is a 64 y.o. male who  has a past medical history of Hyperlipidemia; Hypertension; Erectile dysfunction; Diabetes mellitus without complication ([place]); and CAD (coronary artery disease). and presents today for an office follow up.  1.) Type 2 diabetes - Previous A1c 6.0 and maintained on lifestyle management. Blood sugars at home are under 120 on average. Maintained on ramipril and atorvastatin for CAD risk reduction. Eye exam is up to date. Due for foot exam and Pneumovax.   Lab Results  Component Value Date   HGBA1C 6.0 01/23/2015    2.) Hyperlipidemia - Currently maintained on atorvastatin and fenobrate. Takes the medication as prescribed and denies adverse side effects or myalgias. Has been working to improve his diet.   Lab Results  Component Value Date   CHOL 205* 04/24/2015   HDL 40.50 04/24/2015   LDLCALC 124* 04/24/2015   LDLDIRECT 116.0 01/23/2015   TRIG 200.0* 04/24/2015   CHOLHDL 5 04/24/2015     No Known Allergies   Current Outpatient Prescriptions on File Prior to Visit  Medication Sig Dispense Refill  . AMBULATORY NON FORMULARY MEDICATION Take 90 mg by mouth 2 (two) times daily. Medication Name: Brilinta 90 mg BID (provided by TWILIGHT Study)    . AMBULATORY NON FORMULARY MEDICATION Take 81 mg by mouth daily. Medication Name: ASA 81 mg or PLACEBO (Provided by [facility])    . amLODipine (NORVASC) 5 MG tablet TAKE 1 TABLET BY MOUTH DAILY 90 tablet 2  . atorvastatin (LIPITOR) 40 MG tablet TAKE 1 TABLET BY MOUTH DAILY 90 tablet 2  . fenofibrate 160 MG tablet TAKE 1 TABLET BY MOUTH EVERY DAY 90 tablet 3  . KRILL OIL PO Take by mouth daily.    . metoprolol tartrate (LOPRESSOR) 25 MG tablet TAKE 1 TABLET BY MOUTH DAILY 90 tablet 2  . nicotine (NICODERM CQ) 7  mg/24hr patch Place 1 patch (7 mg total) onto the skin daily. 28 patch 0  . ramipril (ALTACE) 10 MG capsule TAKE ONE CAPSULE BY MOUTH TWICE DAILY 60 capsule 11  . sildenafil (VIAGRA) 100 MG tablet Take 1 tablet (100 mg total) by mouth daily as needed for erectile dysfunction. 10 tablet 5  . zolpidem (AMBIEN) 5 MG tablet Take 1 tablet (5 mg total) by mouth at bedtime as needed for sleep. 30 tablet 0  . nitroGLYCERIN (NITROSTAT) 0.4 MG SL tablet Place 1 tablet (0.4 mg total) under the tongue every 5 (five) minutes as needed for chest pain. (Patient not taking: Reported on 04/24/2015) 25 tablet 3   No current facility-administered medications on file prior to visit.     Review of Systems  Constitutional: Negative for fever and chills.  Respiratory: Negative for chest tightness and shortness of breath.   Cardiovascular: Negative for chest pain, palpitations and leg swelling.  Endocrine: Negative for polydipsia, polyphagia and polyuria.  Neurological: Negative for numbness.      Objective:    BP 110/76 mmHg  Pulse 80  Temp(Src) 98.5 F (36.9 C) (Oral)  Ht 5\' 5"  (1.651 m)  Wt 163 lb (73.936 kg)  BMI 27.12 kg/m2  SpO2 97% Nursing note and vital signs reviewed.  Physical Exam  Constitutional: He is oriented to person, place, and time. He appears well-developed and well-nourished.  No distress.  Cardiovascular: Normal rate, regular rhythm, normal heart sounds and intact distal pulses.   Pulmonary/Chest: Effort normal and breath sounds normal.  Musculoskeletal:  Diabetic foot exam - bilateral feet are free from skin breakdown, cuts, and abrasions. Toenails are neatly trimmed. Pulses are intact and appropriate. Sensation is intact to monofilament bilaterally.  Neurological: He is alert and oriented to person, place, and time.  Skin: Skin is warm and dry.  Psychiatric: He has a normal mood and affect. His behavior is normal. Judgment and thought content normal.       Assessment & Plan:     Problem List Items Addressed This Visit      Endocrine   Non-insulin dependent type 2 diabetes mellitus ([place]) - Primary (Chronic)    Diabetes is well controlled with lifestyle management and previous A1c of 6.0. Denies hypoglycemic events. Maintained on Ramapo and atorvastatin for CAD risk reduction. Eye exam is up-to-date. Foot exam completed today. Declines Pneumovax. Obtain A1c and microalbumin. Follow-up in 6 months or sooner pending A1c results.      Relevant Orders   Hemoglobin A1c   Microalbumin / creatinine urine ratio   Lipid Profile     Other   Hyperlipidemia (Chronic)    Hyperlipidemia currently maintained on atorvastatin and fenofibrate with no adverse side effects. Obtain lipid profile to determine current status. Continue current dosage of atorvastatin and fenofibrate pending lab results.      Relevant Orders   Lipid Profile

## 2015-04-25 ENCOUNTER — Encounter: Payer: Self-pay | Admitting: Physician Assistant

## 2015-05-21 ENCOUNTER — Encounter: Payer: Self-pay | Admitting: *Deleted

## 2015-05-21 DIAGNOSIS — Z006 Encounter for examination for normal comparison and control in clinical research program: Secondary | ICD-10-CM

## 2015-05-21 NOTE — Progress Notes (Signed)
TWILIGHT 4 month follow up telephone visit completed. Patient 100% compliant with medication & has not taken any open label ASA. Patient was in [place] last week and experienced 1 minute or less of chest tightness while at airport. He states he attributes this to stressors of travel/work and heat. Encouraged patient that he needed to have ntg with him,even though he did and did not use and that he may want to touch make with his cardiologist if he felt uneasy. Patient also cut his lip shaving and states "it felt like it took all day to stop". No other bleeding except occasional blowing his nose with pink tinged mucous. Next study visit due March 2017.

## 2015-06-05 ENCOUNTER — Other Ambulatory Visit: Payer: Self-pay | Admitting: Cardiology

## 2015-06-05 NOTE — Telephone Encounter (Signed)
REFILL 

## 2015-07-16 ENCOUNTER — Encounter: Payer: Self-pay | Admitting: Cardiology

## 2015-07-29 NOTE — Progress Notes (Signed)
HPI: FU CAD. Previously followed in [facility]. Patient had a non-ST elevation myocardial infarction in 2002. Cardiac catheterization revealed an 80% proximal right coronary artery followed by an 85%. There was a 90% circumflex. LV function normal. Patient had PCI of his circumflex and right coronary artery. Nuclear study in July of 2012 showed an ejection fraction of 76%. No ischemia.  Admitted 7/16 with a NSTEMI. LHC demonstrated pCFX 95% ISR which was treated with a DES. Residual CAD included mLAD 40%, pRCA stent ok with 25% ISR, mRCA 55%, dRCA 45%, RPDA 30%. EF was preserved.   Since last seen, The patient had 2 brief episodes of chest pain in October and November. The pain occurred in stressful situations. It was substernal and described as a dull sensation lasting 1 minute. Resolved spontaneously. No radiation or associated symptoms. Unlike his previous cardiac pain. Otherwise denies dyspnea on exertion, orthopnea, PND, pedal edema or exertional chest pain.    Studies/Reports Reviewed Today:  LHC/PCI 01/20/15 LAD: Mid 40% LCx: prox 95%, prox to mid 95% ISR RCA: prox to mid stent ok with 25% ISR, mid 55%, dist 45%, RPDA 30%  The left ventricular ejection fraction is 55-65% by visual estimate. PCI: Promus Premier DES to prox to mid LCx  [place]/non-ST elevation myocardial infarction related to severe in-stent restenosis involving the proximal LCx BMS.  Successful redilatation and stenting of the pLCx ISR using a DES with reduction in 95% stenosis to 0% with TIMI grade 3 flow. Final balloon diameter 4.0 mm.  Widely patent proximal to mid right coronary stents with 50% stenosis just distal to the stent margin. This lesion was not felt to be angiographically significant.  Widely patent though diseased LAD, distal right coronary, and PDA. Normal left ventricular systolic function with ejection fraction of 55%.  Current Outpatient Prescriptions  Medication Sig Dispense Refill   . AMBULATORY NON FORMULARY MEDICATION Take 90 mg by mouth 2 (two) times daily. Medication Name: Brilinta 90 mg BID (provided by [facility] Study)    . AMBULATORY NON FORMULARY MEDICATION Take 81 mg by mouth daily. Medication Name: ASA 81 mg or PLACEBO (Provided by [facility])    . amLODipine (NORVASC) 5 MG tablet TAKE 1 TABLET BY MOUTH DAILY 90 tablet 1  . atorvastatin (LIPITOR) 40 MG tablet TAKE 1 TABLET BY MOUTH DAILY 90 tablet 2  . fenofibrate 160 MG tablet TAKE 1 TABLET BY MOUTH EVERY DAY 90 tablet 3  . KRILL OIL PO Take by mouth daily.    . metoprolol tartrate (LOPRESSOR) 25 MG tablet TAKE 1 TABLET BY MOUTH DAILY 90 tablet 2  . nicotine polacrilex (NICORETTE) 4 MG gum Take 4 mg by mouth as needed for smoking cessation.    . nitroGLYCERIN (NITROSTAT) 0.4 MG SL tablet Place 1 tablet (0.4 mg total) under the tongue every 5 (five) minutes as needed for chest pain. 25 tablet 3  . ramipril (ALTACE) 10 MG capsule TAKE ONE CAPSULE BY MOUTH TWICE DAILY 60 capsule 11  . sildenafil (VIAGRA) 100 MG tablet Take 1 tablet (100 mg total) by mouth daily as needed for erectile dysfunction. 10 tablet 5  . zolpidem (AMBIEN) 5 MG tablet Take 1 tablet (5 mg total) by mouth at bedtime as needed for sleep. 30 tablet 0   No current facility-administered medications for this visit.     Past Medical History  Diagnosis Date  . Hyperlipidemia   . Hypertension   . Erectile dysfunction   . Diabetes mellitus  without complication ([place])   . CAD (coronary artery disease)     a. Cath 06/08/01 at [facility] with normal LM and LAD, 95% LCx s/p Circumflex stent 4.0 x 15 mm Penta and 85% and 80% prox RCA s/p 3.5 x 23 mm Penta b. cath 01/20/2015 95% prox LCx ISR treated with DES, 55% prox to mid RCA, 45% distal RCA, 40% midLAD, EF 55%    Past Surgical History  Procedure Laterality Date  . Angioplasty  2002    2 stents  . Rotator cuff repair Right 2009  . Trigger finger release  2013    4 fingers  .  Rotator cuff repair Left 2014  . Coronary angioplasty with stent placement    . Cardiac catheterization N/A 01/20/2015    Procedure: Left Heart Cath and Coronary Angiography;  Surgeon: Belva Crome, MD;  Location: [facility];  Service: Cardiovascular;  Laterality: N/A;    Social History   Social History  . Marital Status: Significant Other    Spouse Name: N/A  . Number of Children: 2  . Years of Education: 18   Occupational History  . Sales    Social History Main Topics  . Smoking status: Current Every Day Smoker -- 1.00 packs/day for 44 years    Types: Cigarettes  . Smokeless tobacco: Never Used  . Alcohol Use: Yes     Comment: occasionally  . Drug Use: No  . Sexual Activity: Yes   Other Topics Concern  . Not on file   Social History Narrative   Fun: Boat, golf   Denies religious beliefs effecting health care.     Family History  Problem Relation Age of Onset  . Colon cancer Neg Hx   . CAD Brother   . CAD Sister   . Heart disease Father     Valve replacement and CAD    ROS: no fevers or chills, productive cough, hemoptysis, dysphasia, odynophagia, melena, hematochezia, dysuria, hematuria, rash, seizure activity, orthopnea, PND, pedal edema, claudication. Remaining systems are negative.  Physical Exam: Well-developed well-nourished in no acute distress.  Skin is warm and dry.  HEENT is normal.  Neck is supple.  Chest is clear to auscultation with normal expansion.  Cardiovascular exam is regular rate and rhythm.  Abdominal exam nontender or distended. No masses palpated. Extremities show no edema. neuro grossly intact  ECG Sinus rhythm at a rate of 78. No ST changes. Left axis deviation.

## 2015-07-30 ENCOUNTER — Encounter: Payer: Self-pay | Admitting: Cardiology

## 2015-07-30 ENCOUNTER — Ambulatory Visit (INDEPENDENT_AMBULATORY_CARE_PROVIDER_SITE_OTHER): Payer: PRIVATE HEALTH INSURANCE | Admitting: Cardiology

## 2015-07-30 VITALS — BP 125/82 | HR 78 | Ht 65.0 in | Wt 171.0 lb

## 2015-07-30 DIAGNOSIS — I2510 Atherosclerotic heart disease of native coronary artery without angina pectoris: Secondary | ICD-10-CM | POA: Diagnosis not present

## 2015-07-30 DIAGNOSIS — R072 Precordial pain: Secondary | ICD-10-CM

## 2015-07-30 DIAGNOSIS — R079 Chest pain, unspecified: Secondary | ICD-10-CM | POA: Insufficient documentation

## 2015-07-30 MED ORDER — ATORVASTATIN CALCIUM 80 MG PO TABS: 80.0000 mg | ORAL_TABLET | Freq: Every day | ORAL | Status: DC

## 2015-07-30 NOTE — Assessment & Plan Note (Signed)
Patient had 2 brief episodes of chest pain in stressful situations. He otherwise has not had exertional chest pain. His electrocardiogram shows no ST changes. We will plan medical therapy for now. We can consider further evaluation if he has recurrent symptoms.

## 2015-07-30 NOTE — Assessment & Plan Note (Signed)
Continue aspirin, twilight study drug and statin.

## 2015-07-30 NOTE — Assessment & Plan Note (Signed)
Given documented coronary disease I will discontinue fenofibrate. Increase Lipitor to 80 mg daily. Check lipids, liver and CK in 4 weeks.

## 2015-07-30 NOTE — Patient Instructions (Signed)
Medication Instructions:   STOP FENOFIBRATE   INCREASE ATORVASTATIN TO 80 MG ONCE DAILY= 2 OF THE 40 MG TABLETS ONCE DAILY  Labwork:  Your physician recommends that you return for lab work in: 4 WEEKS= DO NOT EAT PRIOR TO LAB WORK  Follow-Up:  Your physician wants you to follow-up in: [place] will receive a reminder letter in the mail two months in advance. If you don't receive a letter, please call our office to schedule the follow-up appointment.   If you need a refill on your cardiac medications before your next appointment, please call your pharmacy.

## 2015-07-30 NOTE — Assessment & Plan Note (Signed)
Blood pressure controlled. Continue present medications. 

## 2015-09-01 ENCOUNTER — Other Ambulatory Visit: Payer: Self-pay | Admitting: Cardiology

## 2015-09-02 NOTE — Telephone Encounter (Signed)
Rx(s) sent to pharmacy electronically.  

## 2015-09-12 LAB — LIPID PANEL
CHOL/HDL RATIO: 3.6 ratio (ref <=5.0)
Cholesterol: 151 mg/dL (ref 125–200)
HDL: 42 mg/dL (ref >=40)
LDL CALC: 72 mg/dL (ref <130)
TRIGLYCERIDES: 183 mg/dL — AB (ref <150)
VLDL: 37 mg/dL — AB (ref <30)

## 2015-09-12 LAB — HEPATIC FUNCTION PANEL
ALBUMIN: 4.5 g/dL (ref 3.6–5.1)
ALK PHOS: 70 U/L (ref 40–115)
ALT: 37 U/L (ref 9–46)
AST: 23 U/L (ref 10–35)
BILIRUBIN TOTAL: 0.7 mg/dL (ref 0.2–1.2)
Bilirubin, Direct: 0.1 mg/dL (ref <=0.2)
Indirect Bilirubin: 0.6 mg/dL (ref 0.2–1.2)
TOTAL PROTEIN: 6.6 g/dL (ref 6.1–8.1)

## 2015-09-12 LAB — CK: CK TOTAL: 88 U/L (ref 7–232)

## 2015-09-30 ENCOUNTER — Encounter: Payer: Self-pay | Admitting: *Deleted

## 2015-09-30 DIAGNOSIS — Z006 Encounter for examination for normal comparison and control in clinical research program: Secondary | ICD-10-CM

## 2015-09-30 NOTE — Progress Notes (Signed)
[facility] Research Study month 9 office visit completed. Patient  denies any bleeding events or other adverse events. States has been compliant with brilinta and study drug. Dispensed bottle #'s [id number], [id number], [id number]. Patient returned Bottle # [id number]- 37 pills; [id number]  0 pills, [id number] 97 pills. The Next research visit will be scheduled between 18/sep/17---17/nov/17. This visit will be his last and he will be returning any Brilinta and study drug. Further antiplatelet therapy will be at the discretion of his cardiologist.

## 2015-10-23 ENCOUNTER — Encounter: Payer: Self-pay | Admitting: Family

## 2015-10-23 ENCOUNTER — Ambulatory Visit (INDEPENDENT_AMBULATORY_CARE_PROVIDER_SITE_OTHER): Payer: PRIVATE HEALTH INSURANCE | Admitting: Family

## 2015-10-23 ENCOUNTER — Other Ambulatory Visit (INDEPENDENT_AMBULATORY_CARE_PROVIDER_SITE_OTHER): Payer: PRIVATE HEALTH INSURANCE

## 2015-10-23 VITALS — BP 130/82 | HR 75 | Temp 98.2°F | Resp 16 | Ht 65.0 in | Wt 174.0 lb

## 2015-10-23 DIAGNOSIS — E119 Type 2 diabetes mellitus without complications: Secondary | ICD-10-CM | POA: Diagnosis not present

## 2015-10-23 DIAGNOSIS — I2510 Atherosclerotic heart disease of native coronary artery without angina pectoris: Secondary | ICD-10-CM | POA: Diagnosis not present

## 2015-10-23 DIAGNOSIS — I10 Essential (primary) hypertension: Secondary | ICD-10-CM | POA: Diagnosis not present

## 2015-10-23 DIAGNOSIS — Z7289 Other problems related to lifestyle: Secondary | ICD-10-CM

## 2015-10-23 DIAGNOSIS — I2583 Coronary atherosclerosis due to lipid rich plaque: Secondary | ICD-10-CM

## 2015-10-23 DIAGNOSIS — E785 Hyperlipidemia, unspecified: Secondary | ICD-10-CM

## 2015-10-23 LAB — HEPATITIS C ANTIBODY: HCV Ab: NEGATIVE

## 2015-10-23 LAB — HEMOGLOBIN A1C: Hgb A1c MFr Bld: 6.7 % — ABNORMAL HIGH (ref 4.6–6.5)

## 2015-10-23 NOTE — Patient Instructions (Signed)
Thank you for choosing [facility].  Summary/Instructions:  Please continue to take your medications as prescribed.   Please stop by the lab on the basement level of the building for your blood work. Your results will be released to MyChart (or called to you) after review, usually within 72 hours after test completion. If any changes need to be made, you will be notified at that same time.  If your symptoms worsen or fail to improve, please contact our office for further instruction, or in case of emergency go directly to the emergency room at the closest medical facility.     

## 2015-10-23 NOTE — Assessment & Plan Note (Signed)
Hyperlipidemia with improved control with increased atorvastatin. No adverse side effects or myalgias. Continue working on lifestyle management and continue current dosage of atorvastatin.

## 2015-10-23 NOTE — Assessment & Plan Note (Signed)
Hypertension is well-controlled below goal 140/90 with current regimen. No adverse side effects or new symptoms of end organ damage. Encouraged to monitor blood pressure at home. Continue current dosage of amlodipine, metoprolol, and ramipril.

## 2015-10-23 NOTE — Progress Notes (Signed)
Subjective:    Patient ID: Troy Boyer, male    DOB: January 10, 1951, 65 y.o.   MRN: [id number]  Chief Complaint  Patient presents with  . Follow-up    following up on last lab work that was done with cardiology    HPI:  Troy Boyer is a 65 y.o. male who  has a past medical history of Hyperlipidemia; Hypertension; Erectile dysfunction; Diabetes mellitus without complication ([place]); and CAD (coronary artery disease). and presents today for a follow up office visit.   1.) Hypertension - Currently maintained on amlodipine, metoprolol, and ramipril. Reports taking the medication as prescribed and denies adverse side effects. Does not currently check his blood pressure at home. Working on lifestyle management as well with increased physical activity.  BP Readings from Last 3 Encounters:  10/23/15 130/82  07/30/15 125/82  04/24/15 110/76    2.) Hyperlipidemia - Recently increased atorvastatin to 80 mg and discontinued Fenofibrate at most recent Cardiology visit. Reports taking the medication as prescribed and denies adverse side effects of myalgias. Continues to work on Paramedic.   Lab Results  Component Value Date   CHOL 151 09/12/2015   HDL 42 09/12/2015   LDLCALC 72 09/12/2015   LDLDIRECT 116.0 01/23/2015   TRIG 183* 09/12/2015   CHOLHDL 3.6 09/12/2015    3.) Type 2 diabetes - currently maintained on lifestyle management. Denies symptoms of end organ damage. Blood pressure at home was most recently 87. No polyuria, polyphagia, or polydipsia noted.  Lab Results  Component Value Date   HGBA1C 6.7* 10/23/2015    No Known Allergies   Current Outpatient Prescriptions on File Prior to Visit  Medication Sig Dispense Refill  . AMBULATORY NON FORMULARY MEDICATION Take 90 mg by mouth 2 (two) times daily. Medication Name: Brilinta 90 mg BID (provided by [facility])    . AMBULATORY NON FORMULARY MEDICATION Take 81 mg by mouth daily. Medication Name: ASA 81 mg or  PLACEBO (Provided by [facility])    . amLODipine (NORVASC) 5 MG tablet TAKE 1 TABLET BY MOUTH DAILY 90 tablet 1  . atorvastatin (LIPITOR) 80 MG tablet Take 1 tablet (80 mg total) by mouth daily. 90 tablet 3  . KRILL OIL PO Take by mouth daily.    . metoprolol tartrate (LOPRESSOR) 25 MG tablet TAKE 1 TABLET BY MOUTH DAILY 90 tablet 2  . nicotine polacrilex (NICORETTE) 4 MG gum Take 4 mg by mouth as needed for smoking cessation.    . nitroGLYCERIN (NITROSTAT) 0.4 MG SL tablet Place 1 tablet (0.4 mg total) under the tongue every 5 (five) minutes as needed for chest pain. 25 tablet 3  . ramipril (ALTACE) 10 MG capsule TAKE ONE CAPSULE BY MOUTH TWICE DAILY 60 capsule 11  . sildenafil (VIAGRA) 100 MG tablet Take 1 tablet (100 mg total) by mouth daily as needed for erectile dysfunction. 10 tablet 5  . zolpidem (AMBIEN) 5 MG tablet Take 1 tablet (5 mg total) by mouth at bedtime as needed for sleep. 30 tablet 0   No current facility-administered medications on file prior to visit.    Past Medical History  Diagnosis Date  . Hyperlipidemia   . Hypertension   . Erectile dysfunction   . Diabetes mellitus without complication ([place])   . CAD (coronary artery disease)     a. Cath 06/08/01 at [facility] with normal LM and LAD, 95% LCx s/p Circumflex stent 4.0 x 15 mm Penta and 85% and 80% prox RCA s/p 3.5  x 23 mm Penta b. cath 01/20/2015 95% prox LCx ISR treated with DES, 55% prox to mid RCA, 45% distal RCA, 40% midLAD, EF 55%     Past Surgical History  Procedure Laterality Date  . Angioplasty  2002    2 stents  . Rotator cuff repair Right 2009  . Trigger finger release  2013    4 fingers  . Rotator cuff repair Left 2014  . Coronary angioplasty with stent placement    . Cardiac catheterization N/A 01/20/2015    Procedure: Left Heart Cath and Coronary Angiography;  Surgeon: Belva Crome, MD;  Location: [facility];  Service: Cardiovascular;  Laterality: N/A;    Review of  Systems  Constitutional: Negative for fever and chills.  Eyes:       Negative for changes in vision  Respiratory: Negative for cough, chest tightness and wheezing.   Cardiovascular: Negative for chest pain, palpitations and leg swelling.  Neurological: Negative for dizziness, weakness and light-headedness.      Objective:    BP 130/82 mmHg  Pulse 75  Temp(Src) 98.2 F (36.8 C) (Oral)  Resp 16  Ht 5\' 5"  (1.651 m)  Wt 174 lb (78.926 kg)  BMI 28.96 kg/m2  SpO2 97% Nursing note and vital signs reviewed.  Physical Exam  Constitutional: He is oriented to person, place, and time. He appears well-developed and well-nourished. No distress.  Cardiovascular: Normal rate, regular rhythm, normal heart sounds and intact distal pulses.   Pulmonary/Chest: Effort normal and breath sounds normal.  Neurological: He is alert and oriented to person, place, and time.  Skin: Skin is warm and dry.  Psychiatric: He has a normal mood and affect. His behavior is normal. Judgment and thought content normal.       Assessment & Plan:   Problem List Items Addressed This Visit      Cardiovascular and Mediastinum   Hypertension - Primary (Chronic)    Hypertension is well-controlled below goal 140/90 with current regimen. No adverse side effects or new symptoms of end organ damage. Encouraged to monitor blood pressure at home. Continue current dosage of amlodipine, metoprolol, and ramipril.      CAD (coronary artery disease) (Chronic)    Coronary artery disease appears stable at present and monitored through management of chronic conditions including hypertension and hyperlipidemia which appears medically controlled this time. We'll continue to monitor and follow-up with cardiology as scheduled.        Endocrine   Non-insulin dependent type 2 diabetes mellitus (HCC) (Chronic)    Previous A1c of 6.5. Obtain A1c. Currently managed with lifestyle control. Denies symptoms of end organ damage. Declines  Pneumovax. Diabetic foot and eye exam are up-to-date. He is maintained on ramipril and atorvastatin for CAD risk reduction. Continue with lifestyle management pending work results.      Relevant Orders   Hemoglobin A1c (Completed)     Other   Hyperlipidemia (Chronic)    Hyperlipidemia with improved control with increased atorvastatin. No adverse side effects or myalgias. Continue working on lifestyle management and continue current dosage of atorvastatin.       Other Visit Diagnoses    Other problems related to lifestyle        Relevant Orders    Hepatitis C antibody        I am having Mr. Catano maintain his KRILL OIL PO, nitroGLYCERIN, AMBULATORY NON FORMULARY MEDICATION, zolpidem, sildenafil, ramipril, AMBULATORY NON FORMULARY MEDICATION, amLODipine, nicotine polacrilex, atorvastatin, and metoprolol tartrate.  Follow-up: Return in about 6 months (around 04/23/2016).  [name], FNP

## 2015-10-23 NOTE — Assessment & Plan Note (Signed)
Coronary artery disease appears stable at present and monitored through management of chronic conditions including hypertension and hyperlipidemia which appears medically controlled this time. We'll continue to monitor and follow-up with cardiology as scheduled.

## 2015-10-23 NOTE — Assessment & Plan Note (Signed)
Previous A1c of 6.5. Obtain A1c. Currently managed with lifestyle control. Denies symptoms of end organ damage. Declines Pneumovax. Diabetic foot and eye exam are up-to-date. He is maintained on ramipril and atorvastatin for CAD risk reduction. Continue with lifestyle management pending work results.

## 2015-10-23 NOTE — Progress Notes (Signed)
Pre visit review using our clinic review tool, if applicable. No additional management support is needed unless otherwise documented below in the visit note. 

## 2015-10-24 ENCOUNTER — Encounter: Payer: Self-pay | Admitting: Family

## 2015-11-17 ENCOUNTER — Other Ambulatory Visit: Payer: Self-pay | Admitting: Cardiology

## 2015-11-18 NOTE — Telephone Encounter (Signed)
Rx(s) sent to pharmacy electronically.  

## 2015-11-26 ENCOUNTER — Other Ambulatory Visit: Payer: Self-pay | Admitting: Family

## 2015-11-26 NOTE — Telephone Encounter (Signed)
Last refill was 01/22/15

## 2016-01-12 ENCOUNTER — Encounter: Payer: Self-pay | Admitting: Cardiology

## 2016-01-26 ENCOUNTER — Emergency Department (HOSPITAL_BASED_OUTPATIENT_CLINIC_OR_DEPARTMENT_OTHER): Payer: PRIVATE HEALTH INSURANCE

## 2016-01-26 ENCOUNTER — Encounter (HOSPITAL_BASED_OUTPATIENT_CLINIC_OR_DEPARTMENT_OTHER): Payer: Self-pay | Admitting: *Deleted

## 2016-01-26 ENCOUNTER — Observation Stay (HOSPITAL_BASED_OUTPATIENT_CLINIC_OR_DEPARTMENT_OTHER)
Admission: EM | Admit: 2016-01-26 | Discharge: 2016-01-28 | Disposition: A | Discharge status: Home / Self Care | Payer: PRIVATE HEALTH INSURANCE | Attending: Cardiology | Admitting: Cardiology

## 2016-01-26 DIAGNOSIS — R519 Headache, unspecified: Secondary | ICD-10-CM

## 2016-01-26 DIAGNOSIS — Z79899 Other long term (current) drug therapy: Secondary | ICD-10-CM | POA: Insufficient documentation

## 2016-01-26 DIAGNOSIS — E785 Hyperlipidemia, unspecified: Secondary | ICD-10-CM | POA: Diagnosis not present

## 2016-01-26 DIAGNOSIS — E119 Type 2 diabetes mellitus without complications: Secondary | ICD-10-CM | POA: Insufficient documentation

## 2016-01-26 DIAGNOSIS — Z8249 Family history of ischemic heart disease and other diseases of the circulatory system: Secondary | ICD-10-CM | POA: Insufficient documentation

## 2016-01-26 DIAGNOSIS — I252 Old myocardial infarction: Secondary | ICD-10-CM | POA: Insufficient documentation

## 2016-01-26 DIAGNOSIS — F17210 Nicotine dependence, cigarettes, uncomplicated: Secondary | ICD-10-CM | POA: Diagnosis not present

## 2016-01-26 DIAGNOSIS — I25110 Atherosclerotic heart disease of native coronary artery with unstable angina pectoris: Secondary | ICD-10-CM | POA: Diagnosis not present

## 2016-01-26 DIAGNOSIS — R51 Headache: Secondary | ICD-10-CM | POA: Insufficient documentation

## 2016-01-26 DIAGNOSIS — I10 Essential (primary) hypertension: Secondary | ICD-10-CM | POA: Insufficient documentation

## 2016-01-26 DIAGNOSIS — Z955 Presence of coronary angioplasty implant and graft: Secondary | ICD-10-CM | POA: Insufficient documentation

## 2016-01-26 DIAGNOSIS — R0789 Other chest pain: Secondary | ICD-10-CM | POA: Diagnosis present

## 2016-01-26 DIAGNOSIS — R072 Precordial pain: Secondary | ICD-10-CM | POA: Diagnosis present

## 2016-01-26 DIAGNOSIS — I200 Unstable angina: Secondary | ICD-10-CM | POA: Diagnosis present

## 2016-01-26 LAB — BASIC METABOLIC PANEL
Anion gap: 10 (ref 5–15)
BUN: 15 mg/dL (ref 6–20)
CALCIUM: 8.8 mg/dL — AB (ref 8.9–10.3)
CHLORIDE: 105 mmol/L (ref 101–111)
CO2: 22 mmol/L (ref 22–32)
CREATININE: 0.83 mg/dL (ref 0.61–1.24)
GFR calc Af Amer: >60 mL/min (ref >60)
GFR calc non Af Amer: >60 mL/min (ref >60)
Glucose, Bld: 135 mg/dL — ABNORMAL HIGH (ref 65–99)
Potassium: 3.9 mmol/L (ref 3.5–5.1)
SODIUM: 137 mmol/L (ref 135–145)

## 2016-01-26 LAB — TROPONIN I: Troponin I: <0.03 ng/mL (ref <0.03)

## 2016-01-26 LAB — CBC
HCT: 42.5 % (ref 39.0–52.0)
Hemoglobin: 14.2 g/dL (ref 13.0–17.0)
MCH: 31.4 pg (ref 26.0–34.0)
MCHC: 33.4 g/dL (ref 30.0–36.0)
MCV: 94 fL (ref 78.0–100.0)
PLATELETS: 281 10*3/uL (ref 150–400)
RBC: 4.52 MIL/uL (ref 4.22–5.81)
RDW: 13.1 % (ref 11.5–15.5)
WBC: 10.1 10*3/uL (ref 4.0–10.5)

## 2016-01-26 MED ORDER — ACETAMINOPHEN 325 MG PO TABS
650.0000 mg | ORAL_TABLET | Freq: Once | ORAL | Status: AC
  Administered 2016-01-26: 650 mg via ORAL
  Filled 2016-01-26: qty 2

## 2016-01-26 MED ORDER — NITROGLYCERIN 0.4 MG SL SUBL
0.4000 mg | SUBLINGUAL_TABLET | SUBLINGUAL | Status: DC | PRN
  Filled 2016-01-26: qty 1

## 2016-01-26 NOTE — ED Provider Notes (Addendum)
[facility] Provider Note   CSN: [id number] Arrival date & time: 01/26/16  2217  First Provider Contact:   First MD Initiated Contact with Patient 01/26/16 2314     By signing my name below, I, Higinio Plan, attest that this documentation has been prepared under the direction and in the presence of Shanon Rosser, MD . Electronically Signed: Higinio Plan, Scribe. 01/26/2016. 11:22 PM.  History   Chief Complaint Chief Complaint  Patient presents with  . Chest Pain   HPI  HPI Comments: Troy Boyer is a 65 y.o. male who presents to the Emergency Department complaining of intermittent chest pain described as chest pressure that began yesterday. He notes he is only experiencing a "twinge" of pain now in the ED. His pain was 8/10 at its worst. He describes his current pain as similar to "heartburn." He states his pain is exacerbated when lying down but relieved when walking and moving around. He notes he has experienced similar symptoms twice before; in 2002 he received two stents, and in 2016 he had another stent placed.   He reports he was watching tv earlier this evening when he developed a headache with "colors turned weird" and double vision for 1-2 hours. He states these symptoms have now resolved; the headache was not severe. He states he feels the need to take deep breaths when the pain occurs but he denies nausea and diaphoresis. He states he is currently taking Brilinta and is participating in the Twilight study (ASA vs. placebo).   Past Medical History:  Diagnosis Date  . CAD (coronary artery disease)    a. Cath 06/08/01 at [facility] with normal LM and LAD, 95% LCx s/p Circumflex stent 4.0 x 15 mm Penta and 85% and 80% prox RCA s/p 3.5 x 23 mm Penta b. cath 01/20/2015 95% prox LCx ISR treated with DES, 55% prox to mid RCA, 45% distal RCA, 40% midLAD, EF 55%  . Diabetes mellitus without complication ([place])   . Erectile dysfunction   . Hyperlipidemia   . Hypertension      Patient Active Problem List   Diagnosis Date Noted  . Chest pain 07/30/2015  . NSTEMI (non-ST elevated myocardial infarction) ([place]) 01/19/2015  . Non-insulin dependent type 2 diabetes mellitus ([place]) 10/21/2014  . Tobacco abuse 10/17/2013  . Hypertension   . Hyperlipidemia   . CAD (coronary artery disease)     Past Surgical History:  Procedure Laterality Date  . ANGIOPLASTY  2002   2 stents  . CARDIAC CATHETERIZATION N/A 01/20/2015   Procedure: Left Heart Cath and Coronary Angiography;  Surgeon: Belva Crome, MD;  Location: [place] [facility];  Service: Cardiovascular;  Laterality: N/A;  . CORONARY ANGIOPLASTY WITH STENT PLACEMENT    . ROTATOR CUFF REPAIR Right 2009  . ROTATOR CUFF REPAIR Left 2014  . TRIGGER FINGER RELEASE  2013   4 fingers    Home Medications    Prior to Admission medications   Medication Sig Start Date End Date Taking? Authorizing Provider  AMBULATORY NON FORMULARY MEDICATION Take 90 mg by mouth 2 (two) times daily. Medication Name: Brilinta 90 mg BID (provided by TWILIGHT Study) 01/21/15   Burnell Blanks, MD  AMBULATORY NON FORMULARY MEDICATION Take 81 mg by mouth daily. Medication Name: ASA 81 mg or PLACEBO (Provided by TWILIGHT Research Study) 04/22/15   Burnell Blanks, MD  amLODipine (NORVASC) 5 MG tablet TAKE 1 TABLET BY MOUTH DAILY 11/18/15   Lelon Perla, MD  atorvastatin (LIPITOR) 80 MG tablet Take 1 tablet (80 mg total) by mouth daily. 07/30/15   Lelon Perla, MD  KRILL OIL PO Take by mouth daily.    Historical Provider, MD  metoprolol tartrate (LOPRESSOR) 25 MG tablet TAKE 1 TABLET BY MOUTH DAILY 09/02/15   Lelon Perla, MD  nicotine polacrilex (NICORETTE) 4 MG gum Take 4 mg by mouth as needed for smoking cessation.    Historical Provider, MD  nitroGLYCERIN (NITROSTAT) 0.4 MG SL tablet Place 1 tablet (0.4 mg total) under the tongue every 5 (five) minutes as needed for chest pain. 09/18/14   Lelon Perla, MD  ramipril  (ALTACE) 10 MG capsule TAKE ONE CAPSULE BY MOUTH TWICE DAILY 04/04/15   Lelon Perla, MD  sildenafil (VIAGRA) 100 MG tablet Take 1 tablet (100 mg total) by mouth daily as needed for erectile dysfunction. 03/19/15   Lelon Perla, MD  zolpidem (AMBIEN) 5 MG tablet TAKE 1 TABLET BY MOUTH AT BEDTIME AS NEEDED FOR SLEEP 11/27/15   Golden Circle, FNP    Family History Family History  Problem Relation Age of Onset  . CAD Brother   . CAD Sister   . Heart disease Father     Valve replacement and CAD  . Colon cancer Neg Hx     Social History Social History  Substance Use Topics  . Smoking status: Current Every Day Smoker    Packs/day: 1.00    Years: 44.00    Types: Cigarettes  . Smokeless tobacco: Never Used  . Alcohol use Yes     Comment: occasionally     Allergies   Review of patient's allergies indicates no known allergies.   Review of Systems Review of Systems 10 systems reviewed and all are negative for acute change except as noted in the HPI.  Physical Exam Updated Vital Signs BP 126/81   Pulse 79   Temp 99.5 F (37.5 C) (Oral)   Resp 16   Ht 5\' 5"  (1.651 m)   Wt 174 lb (78.9 kg)   SpO2 95%   BMI 28.96 kg/m   Physical Exam  General: Well-developed, well-nourished male in no acute distress; appearance consistent with age of record HENT: normocephalic; atraumatic Eyes: pupils equal, round and reactive to light; extraocular muscles intact Neck: supple; no bruit Heart: regular rate and rhythm; no murmurs, rubs or gallops Lungs: clear to auscultation bilaterally Abdomen: soft; nondistended; nontender; no masses or hepatosplenomegaly; bowel sounds present Extremities: No deformity; full range of motion; pulses normal Neurologic: Awake, alert and oriented; motor function intact in all extremities and symmetric; no facial droop Skin: Warm and dry Psychiatric: Normal mood and affect   ED Treatments / Results   Nursing notes and vitals signs, including pulse  oximetry, reviewed.  Summary of this visit's results, reviewed by myself:  Labs:  Results for orders placed or performed during the hospital encounter of 01/26/16 (from the past 24 hour(s))  Basic metabolic panel     Status: Abnormal   Collection Time: 01/26/16 10:40 PM  Result Value Ref Range   Sodium 137 135 - 145 mmol/L   Potassium 3.9 3.5 - 5.1 mmol/L   Chloride 105 101 - 111 mmol/L   CO2 22 22 - 32 mmol/L   Glucose, Bld 135 (H) 65 - 99 mg/dL   BUN 15 6 - 20 mg/dL   Creatinine, Ser 0.83 0.61 - 1.24 mg/dL   Calcium 8.8 (L) 8.9 - 10.3 mg/dL   GFR calc  non Af Amer >60 >60 mL/min   GFR calc Af Amer >60 >60 mL/min   Anion gap 10 5 - 15  CBC     Status: None   Collection Time: 01/26/16 10:40 PM  Result Value Ref Range   WBC 10.1 4.0 - 10.5 K/uL   RBC 4.52 4.22 - 5.81 MIL/uL   Hemoglobin 14.2 13.0 - 17.0 g/dL   HCT 42.5 39.0 - 52.0 %   MCV 94.0 78.0 - 100.0 fL   MCH 31.4 26.0 - 34.0 pg   MCHC 33.4 30.0 - 36.0 g/dL   RDW 13.1 11.5 - 15.5 %   Platelets 281 150 - 400 K/uL  Troponin I     Status: None   Collection Time: 01/26/16 10:40 PM  Result Value Ref Range   Troponin I <0.03 <0.03 ng/mL    Imaging Studies: Dg Chest 2 View  Result Date: 01/26/2016 CLINICAL DATA:  65 year old male with chest pain EXAM: CHEST  2 VIEW COMPARISON:  Chest radiograph dated 01/19/2015 FINDINGS: There is no focal consolidation, pleural effusion, or pneumothorax. The cardiac silhouette is within normal limits with no acute osseous pathology. IMPRESSION: No active cardiopulmonary disease. Electronically Signed   By: Anner Crete M.D.   On: 01/26/2016 23:12    EKG  EKG Interpretation  Date/Time:  Monday January 26 2016 22:20:40 EDT Ventricular Rate:  79 PR Interval:  142 QRS Duration: 98 QT Interval:  374 QTC Calculation: 428 R Axis:   -59 Text Interpretation:  Normal sinus rhythm Left anterior fascicular block Abnormal ECG No significant change was found Confirmed by Florina Ou  MD, Jenny Reichmann ([id number])  on 01/26/2016 11:12:28 PM       Procedures    Final Clinical Impressions(s) / ED Diagnoses  12:09 AM Dr. Percival Spanish accepts for transfer to [facility].  Final diagnoses:  Unstable angina [facility])    New Prescriptions New Prescriptions   No medications on file   I personally performed the services described in this documentation, which was scribed in my presence. The recorded information has been reviewed and is accurate.     Shanon Rosser, MD 01/27/16 0010    Shanon Rosser, MD 01/27/16 [id number]

## 2016-01-26 NOTE — ED Notes (Signed)
Patient denies pain and is resting comfortably.  

## 2016-01-26 NOTE — ED Notes (Signed)
Pt currently denies chest pain

## 2016-01-26 NOTE — ED Triage Notes (Signed)
Chest pain described as pressure. His site was blurred. Sob with the pain.

## 2016-01-27 ENCOUNTER — Observation Stay (HOSPITAL_COMMUNITY): Payer: PRIVATE HEALTH INSURANCE

## 2016-01-27 ENCOUNTER — Encounter (HOSPITAL_COMMUNITY)
Admission: EM | Disposition: A | Discharge status: Home / Self Care | Payer: Self-pay | Source: Home / Self Care | Attending: Emergency Medicine

## 2016-01-27 ENCOUNTER — Encounter (HOSPITAL_COMMUNITY): Payer: Self-pay | Admitting: Cardiology

## 2016-01-27 DIAGNOSIS — I2510 Atherosclerotic heart disease of native coronary artery without angina pectoris: Secondary | ICD-10-CM | POA: Diagnosis not present

## 2016-01-27 DIAGNOSIS — R072 Precordial pain: Secondary | ICD-10-CM | POA: Diagnosis present

## 2016-01-27 DIAGNOSIS — I200 Unstable angina: Secondary | ICD-10-CM | POA: Diagnosis present

## 2016-01-27 HISTORY — PX: CARDIAC CATHETERIZATION: SHX172

## 2016-01-27 LAB — TROPONIN I
TROPONIN I: 0.2 ng/mL — AB (ref <0.03)
Troponin I: 0.06 ng/mL (ref <0.03)
Troponin I: 0.54 ng/mL (ref <0.03)

## 2016-01-27 LAB — PROTIME-INR
INR: 1.23
PROTHROMBIN TIME: 15.6 s — AB (ref 11.4–15.2)

## 2016-01-27 SURGERY — LEFT HEART CATH AND CORONARY ANGIOGRAPHY
Anesthesia: LOCAL

## 2016-01-27 MED ORDER — SODIUM CHLORIDE 0.9% FLUSH: 3.0000 mL | INTRAVENOUS | Status: DC | PRN

## 2016-01-27 MED ORDER — MIDAZOLAM HCL 2 MG/2ML IJ SOLN
INTRAMUSCULAR | Status: AC
  Filled 2016-01-27: qty 2

## 2016-01-27 MED ORDER — TICAGRELOR 90 MG PO TABS
90.0000 mg | ORAL_TABLET | Freq: Two times a day (BID) | ORAL | Status: DC
Start: 2016-01-27 — End: 2016-01-28
  Administered 2016-01-27 – 2016-01-28 (×4): 90 mg via ORAL
  Filled 2016-01-27 (×4): qty 1

## 2016-01-27 MED ORDER — HEPARIN (PORCINE) IN NACL 2-0.9 UNIT/ML-% IJ SOLN
INTRAMUSCULAR | Status: DC | PRN
  Administered 2016-01-27: 1000 mL

## 2016-01-27 MED ORDER — SODIUM CHLORIDE 0.9 % IV SOLN: 250.0000 mL | INTRAVENOUS | Status: DC | PRN

## 2016-01-27 MED ORDER — LIDOCAINE HCL (PF) 1 % IJ SOLN
INTRAMUSCULAR | Status: AC
  Filled 2016-01-27: qty 30

## 2016-01-27 MED ORDER — ASPIRIN 81 MG PO CHEW
324.0000 mg | CHEWABLE_TABLET | Freq: Once | ORAL | Status: AC
  Administered 2016-01-27: 324 mg via ORAL
  Filled 2016-01-27: qty 4

## 2016-01-27 MED ORDER — ACETAMINOPHEN 325 MG PO TABS
650.0000 mg | ORAL_TABLET | ORAL | Status: DC | PRN
  Administered 2016-01-27 – 2016-01-28 (×2): 650 mg via ORAL
  Filled 2016-01-27 (×2): qty 2

## 2016-01-27 MED ORDER — HEPARIN (PORCINE) IN NACL 2-0.9 UNIT/ML-% IJ SOLN
INTRAMUSCULAR | Status: AC
  Filled 2016-01-27: qty 1000

## 2016-01-27 MED ORDER — SODIUM CHLORIDE 0.9% FLUSH
3.0000 mL | Freq: Two times a day (BID) | INTRAVENOUS | Status: DC
  Administered 2016-01-27: 3 mL via INTRAVENOUS

## 2016-01-27 MED ORDER — FENTANYL CITRATE (PF) 100 MCG/2ML IJ SOLN
INTRAMUSCULAR | Status: DC | PRN
  Administered 2016-01-27: 25 ug via INTRAVENOUS

## 2016-01-27 MED ORDER — ZOLPIDEM TARTRATE 5 MG PO TABS: 5.0000 mg | ORAL_TABLET | Freq: Every evening | ORAL | Status: DC | PRN

## 2016-01-27 MED ORDER — ASPIRIN 81 MG PO CHEW: 81.0000 mg | CHEWABLE_TABLET | Freq: Once | ORAL | Status: DC

## 2016-01-27 MED ORDER — SODIUM CHLORIDE 0.9 % WEIGHT BASED INFUSION: 3.0000 mL/kg/h | INTRAVENOUS | Status: AC

## 2016-01-27 MED ORDER — VERAPAMIL HCL 2.5 MG/ML IV SOLN
INTRAVENOUS | Status: AC
  Filled 2016-01-27: qty 2

## 2016-01-27 MED ORDER — AMLODIPINE BESYLATE 5 MG PO TABS
5.0000 mg | ORAL_TABLET | Freq: Every day | ORAL | Status: DC
  Administered 2016-01-27 – 2016-01-28 (×2): 5 mg via ORAL
  Filled 2016-01-27 (×2): qty 1

## 2016-01-27 MED ORDER — ATORVASTATIN CALCIUM 80 MG PO TABS
80.0000 mg | ORAL_TABLET | Freq: Every day | ORAL | Status: DC
  Administered 2016-01-27: 80 mg via ORAL
  Filled 2016-01-27 (×2): qty 1

## 2016-01-27 MED ORDER — SODIUM CHLORIDE 0.9 % IV SOLN
INTRAVENOUS | Status: DC
  Administered 2016-01-27: 15:00:00 via INTRAVENOUS

## 2016-01-27 MED ORDER — HEPARIN SODIUM (PORCINE) 1000 UNIT/ML IJ SOLN
INTRAMUSCULAR | Status: AC
  Filled 2016-01-27: qty 1

## 2016-01-27 MED ORDER — SODIUM CHLORIDE 0.9% FLUSH
3.0000 mL | Freq: Two times a day (BID) | INTRAVENOUS | Status: DC
  Administered 2016-01-27 – 2016-01-28 (×2): 3 mL via INTRAVENOUS

## 2016-01-27 MED ORDER — MIDAZOLAM HCL 2 MG/2ML IJ SOLN
INTRAMUSCULAR | Status: DC | PRN
  Administered 2016-01-27: 2 mg via INTRAVENOUS

## 2016-01-27 MED ORDER — IOPAMIDOL (ISOVUE-370) INJECTION 76%
INTRAVENOUS | Status: DC | PRN
  Administered 2016-01-27: 75 mL via INTRA_ARTERIAL

## 2016-01-27 MED ORDER — LIDOCAINE HCL (PF) 1 % IJ SOLN
INTRAMUSCULAR | Status: DC | PRN
  Administered 2016-01-27: 2 mL via SUBCUTANEOUS

## 2016-01-27 MED ORDER — CALCIUM CARBONATE ANTACID 500 MG PO CHEW
2.0000 | CHEWABLE_TABLET | Freq: Once | ORAL | Status: AC
  Administered 2016-01-27: 400 mg via ORAL
  Filled 2016-01-27: qty 2

## 2016-01-27 MED ORDER — HEPARIN BOLUS VIA INFUSION
4000.0000 [IU] | Freq: Once | INTRAVENOUS | Status: AC
  Administered 2016-01-27: 4000 [IU] via INTRAVENOUS
  Filled 2016-01-27: qty 4000

## 2016-01-27 MED ORDER — SODIUM CHLORIDE 0.9 % WEIGHT BASED INFUSION: 3.0000 mL/kg/h | INTRAVENOUS | Status: DC

## 2016-01-27 MED ORDER — HEPARIN SODIUM (PORCINE) 1000 UNIT/ML IJ SOLN
INTRAMUSCULAR | Status: DC | PRN
  Administered 2016-01-27: 4000 [IU] via INTRAVENOUS

## 2016-01-27 MED ORDER — ONDANSETRON HCL 4 MG/2ML IJ SOLN: 4.0000 mg | Freq: Four times a day (QID) | INTRAMUSCULAR | Status: DC | PRN

## 2016-01-27 MED ORDER — NICOTINE POLACRILEX 2 MG MT GUM
4.0000 mg | CHEWING_GUM | OROMUCOSAL | Status: DC | PRN
  Filled 2016-01-27: qty 2

## 2016-01-27 MED ORDER — IOPAMIDOL (ISOVUE-370) INJECTION 76%
INTRAVENOUS | Status: AC
  Filled 2016-01-27: qty 100

## 2016-01-27 MED ORDER — METOPROLOL TARTRATE 25 MG PO TABS
25.0000 mg | ORAL_TABLET | Freq: Every day | ORAL | Status: DC
  Administered 2016-01-27 – 2016-01-28 (×2): 25 mg via ORAL
  Filled 2016-01-27 (×2): qty 1

## 2016-01-27 MED ORDER — FENTANYL CITRATE (PF) 100 MCG/2ML IJ SOLN
INTRAMUSCULAR | Status: AC
  Filled 2016-01-27: qty 2

## 2016-01-27 MED ORDER — ASPIRIN 81 MG PO CHEW: 81.0000 mg | CHEWABLE_TABLET | ORAL | Status: DC

## 2016-01-27 MED ORDER — SODIUM CHLORIDE 0.9 % WEIGHT BASED INFUSION
1.0000 mL/kg/h | INTRAVENOUS | Status: DC
Start: 2016-01-28 — End: 2016-01-27

## 2016-01-27 MED ORDER — HEPARIN (PORCINE) IN NACL 100-0.45 UNIT/ML-% IJ SOLN
900.0000 [IU]/h | INTRAMUSCULAR | Status: DC
  Administered 2016-01-27: 900 [IU]/h via INTRAVENOUS
  Filled 2016-01-27: qty 250

## 2016-01-27 MED ORDER — RAMIPRIL 5 MG PO CAPS
10.0000 mg | ORAL_CAPSULE | Freq: Two times a day (BID) | ORAL | Status: DC
  Administered 2016-01-27 – 2016-01-28 (×4): 10 mg via ORAL
  Filled 2016-01-27 (×4): qty 2

## 2016-01-27 MED ORDER — VERAPAMIL HCL 2.5 MG/ML IV SOLN
INTRAVENOUS | Status: DC | PRN
  Administered 2016-01-27: 10 mL via INTRA_ARTERIAL

## 2016-01-27 SURGICAL SUPPLY — 11 items

## 2016-01-27 NOTE — H&P (View-Only) (Signed)
    Subjective:  Denies CP or dyspnea   Objective:  Vitals:   01/27/16 0101 01/27/16 0139 01/27/16 0611 01/27/16 0953  BP: 138/83 (!) 153/77 137/75 (!) 141/79  Pulse: 81 73 79   Resp: 16 18    Temp:  98.7 F (37.1 C) 98.7 F (37.1 C)   TempSrc:  Oral Oral   SpO2: 97% 99% 97%   Weight:  166 lb 7.2 oz (75.5 kg)    Height:  5\' 4"  (1.626 m)      Intake/Output from previous day:  Intake/Output Summary (Last 24 hours) at 01/27/16 1007 Last data filed at 01/27/16 1000  Gross per 24 hour  Intake              250 ml  Output                0 ml  Net              250 ml    Physical Exam: Physical exam: Well-developed well-nourished in no acute distress.  Skin is warm and dry.  HEENT is normal.  Neck is supple.  Chest is clear to auscultation with normal expansion.  Cardiovascular exam is regular rate and rhythm.  Abdominal exam nontender or distended. No masses palpated. Extremities show no edema. neuro grossly intact    Lab Results: Basic Metabolic Panel:  Recent Labs  01/26/16 2240  NA 137  K 3.9  CL 105  CO2 22  GLUCOSE 135*  BUN 15  CREATININE 0.83  CALCIUM 8.8*   CBC:  Recent Labs  01/26/16 2240  WBC 10.1  HGB 14.2  HCT 42.5  MCV 94.0  PLT 281   Cardiac Enzymes:  Recent Labs  01/27/16 0232 01/27/16 0441 01/27/16 0819  TROPONINI 0.06* 0.20* 0.54*     Assessment/Plan:  1 non-ST elevation myocardial infarction-patient's troponins are abnormal. Symptoms similar to prior MI. Plan to continue aspirin, brilinta, statin and beta blocker. Add IV heparin. He will require cardiac catheterization. The risks and benefits were discussed and he agrees to proceed. 2 Hyperlipidemia-continue statin. 3 hypertension-continue present medications. 4 CAD-continue asa, brilinta and statin. Kirk Ruths 01/27/2016, 10:07 AM

## 2016-01-27 NOTE — Interval H&P Note (Signed)
Cath Lab Visit (complete for each Cath Lab visit)  Clinical Evaluation Leading to the Procedure:   ACS: Yes.    Non-ACS:    Anginal Classification: CCS IV  Anti-ischemic medical therapy: Maximal Therapy (2 or more classes of medications)  Non-Invasive Test Results: No non-invasive testing performed  Prior CABG: No previous CABG      History and Physical Interval Note:  01/27/2016 2:03 PM  [name]  has presented today for surgery, with the diagnosis of cp  The various methods of treatment have been discussed with the patient and family. After consideration of risks, benefits and other options for treatment, the patient has consented to  Procedure(s): Left Heart Cath and Coronary Angiography (N/A) as a surgical intervention .  The patient's history has been reviewed, patient examined, no change in status, stable for surgery.  I have reviewed the patient's chart and labs.  Questions were answered to the patient's satisfaction.     Sherren Mocha

## 2016-01-27 NOTE — ED Notes (Signed)
Pt transferred to [place] [facility] via [facility] 

## 2016-01-27 NOTE — H&P (Signed)
CARDIOLOGY ADMISSION NOTE  Patient ID: Troy Boyer MRN: [id number] DOB/AGE: 30-Mar-1951 65 y.o.  Admit date: 01/26/2016 Primary Physician   [name], FNP Primary Cardiologist   Dr. Stanford Breed Chief Complaint    Chest pain  HPI:  The patient has a history of CAD with non-ST elevation myocardial infarction in 2002. Cardiac catheterization revealed an 80% proximal right coronary artery followed by an 85%. There was a 90% circumflex. LV function was normal. He had PCI of his circumflex and right coronary artery. Nuclear study in July of 2012 showed an ejection fraction of 76%. No ischemia.  He was admitted 7/16 with a NSTEMI. LHC demonstrated pCFX 95% ISR which was treated with a DES. Residual CAD included mLAD 40%, pRCA stent ok with 25% ISR, mRCA 55%, dRCA 45%, RPDA 30%. EF was preserved.   He presents now with chest pain.  He has had pain with stressful situations as mentioned in his most recent office note.  However, he presented today with pain different from this and somewhat similar to his previous angina.   In the ED the initial enzymes were negative.  His EKG was non acute.  He reports that the pain is pressure in the mid/left sternal area.  It came at rest and actually would feel better walking around and worse when he would lie flat.  It peaked at 8/10.  However, he did not think that it was severe enough to take a NTG.  It went away on its own.  He had no associated N/V or SOB.  There was no radiation.  He did have blurred vision which was new for him.  This went away on its own.  He had a headache in the ED.  He was treated with ASA.     Past Medical History:  Diagnosis Date  . CAD (coronary artery disease)    a. Cath 06/08/01 at [facility] with normal LM and LAD, 95% LCx s/p Circumflex stent 4.0 x 15 mm Penta and 85% and 80% prox RCA s/p 3.5 x 23 mm Penta b. cath 01/20/2015 95% prox LCx ISR treated with DES, 55% prox to mid RCA, 45% distal RCA, 40% midLAD, EF 55%  .  Diabetes mellitus without complication ([place])   . Erectile dysfunction   . Hyperlipidemia   . Hypertension     Past Surgical History:  Procedure Laterality Date  . ANGIOPLASTY  2002   2 stents  . CARDIAC CATHETERIZATION N/A 01/20/2015   Procedure: Left Heart Cath and Coronary Angiography;  Surgeon: Belva Crome, MD;  Location: [place] [facility];  Service: Cardiovascular;  Laterality: N/A;  . CORONARY ANGIOPLASTY WITH STENT PLACEMENT    . ROTATOR CUFF REPAIR Right 2009  . ROTATOR CUFF REPAIR Left 2014  . TRIGGER FINGER RELEASE  2013   4 fingers    No Known Allergies No current facility-administered medications on file prior to encounter.    Current Outpatient Prescriptions on File Prior to Encounter  Medication Sig Dispense Refill  . AMBULATORY NON FORMULARY MEDICATION Take 90 mg by mouth 2 (two) times daily. Medication Name: Brilinta 90 mg BID (provided by TWILIGHT Study)    . AMBULATORY NON FORMULARY MEDICATION Take 81 mg by mouth daily. Medication Name: ASA 81 mg or PLACEBO (Provided by [facility])    . amLODipine (NORVASC) 5 MG tablet TAKE 1 TABLET BY MOUTH DAILY 90 tablet 3  . atorvastatin (LIPITOR) 80 MG tablet Take 1 tablet (80 mg total) by mouth daily.  90 tablet 3  . KRILL OIL PO Take by mouth daily.    . metoprolol tartrate (LOPRESSOR) 25 MG tablet TAKE 1 TABLET BY MOUTH DAILY 90 tablet 2  . nicotine polacrilex (NICORETTE) 4 MG gum Take 4 mg by mouth as needed for smoking cessation.    . nitroGLYCERIN (NITROSTAT) 0.4 MG SL tablet Place 1 tablet (0.4 mg total) under the tongue every 5 (five) minutes as needed for chest pain. 25 tablet 3  . ramipril (ALTACE) 10 MG capsule TAKE ONE CAPSULE BY MOUTH TWICE DAILY 60 capsule 11  . sildenafil (VIAGRA) 100 MG tablet Take 1 tablet (100 mg total) by mouth daily as needed for erectile dysfunction. 10 tablet 5  . zolpidem (AMBIEN) 5 MG tablet TAKE 1 TABLET BY MOUTH AT BEDTIME AS NEEDED FOR SLEEP 30 tablet 0   Social History     Social History  . Marital status: Single    Spouse name: N/A  . Number of children: 2  . Years of education: 77   Occupational History  . [employer]   Social History Main Topics  . Smoking status: Current Every Day Smoker    Packs/day: 1.00    Years: 44.00    Types: Cigarettes  . Smokeless tobacco: Never Used  . Alcohol use Yes     Comment: occasionally  . Drug use: No  . Sexual activity: Yes   Other Topics Concern  . Not on file   Social History Narrative   Fun: Boat, golf   Denies religious beliefs effecting health care.     Family History  Problem Relation Age of Onset  . CAD Brother   . CAD Sister   . Heart disease Father     Valve replacement and CAD  . Colon cancer Neg Hx      ROS:  As stated in the HPI and negative for all other systems.   Physical Exam: Blood pressure 137/80, pulse 80, temperature 99.5 F (37.5 C), temperature source Oral, resp. rate 17, height 5\' 5"  (1.651 m), weight 174 lb (78.9 kg), SpO2 95 %.  GENERAL:  Well appearing HEENT:  Pupils equal round and reactive, fundi not visualized, oral mucosa unremarkable NECK:  No jugular venous distention, waveform within normal limits, carotid upstroke brisk and symmetric, no bruits, no thyromegaly LYMPHATICS:  No cervical, inguinal adenopathy LUNGS:  Clear to auscultation bilaterally BACK:  No CVA tenderness CHEST:  Unremarkable HEART:  PMI not displaced or sustained,S1 and S2 within normal limits, no S3, no S4, no clicks, no rubs, no murmurs ABD:  Flat, positive bowel sounds normal in frequency in pitch, no bruits, no rebound, no guarding, no midline pulsatile mass, no hepatomegaly, no splenomegaly EXT:  2 plus pulses throughout, no edema, no cyanosis no clubbing SKIN:  No rashes no nodules NEURO:  Cranial nerves II through XII grossly intact, motor grossly intact throughout PSYCH:  Cognitively intact, oriented to person place and time  Labs: Lab Results  Component Value Date    BUN 15 01/26/2016   Lab Results  Component Value Date   CREATININE 0.83 01/26/2016   Lab Results  Component Value Date   NA 137 01/26/2016   K 3.9 01/26/2016   CL 105 01/26/2016   CO2 22 01/26/2016   Lab Results  Component Value Date   TROPONINI <0.03 01/26/2016   Lab Results  Component Value Date   WBC 10.1 01/26/2016   HGB 14.2 01/26/2016   HCT 42.5 01/26/2016   MCV 94.0  01/26/2016   PLT 281 01/26/2016   Lab Results  Component Value Date   CHOL 151 09/12/2015   HDL 42 09/12/2015   LDLCALC 72 09/12/2015   LDLDIRECT 116.0 01/23/2015   TRIG 183 (H) 09/12/2015   CHOLHDL 3.6 09/12/2015   Lab Results  Component Value Date   ALT 37 09/12/2015   AST 23 09/12/2015   ALKPHOS 70 09/12/2015   BILITOT 0.7 09/12/2015   Lab Results  Component Value Date   HGBA1C 6.7 (H) 10/23/2015     Radiology:  CXR:  There is no focal consolidation, pleural effusion, or pneumothorax. The cardiac silhouette is within normal limits with no acute osseous pathology.  EKG:  NSR, rate 79, LAD, LAFB.  No acute ST T wave changes.  01/26/16  ASSESSMENT AND PLAN:    CHEST PAIN:  The pain is somewhat atypical but has some similar features to his previous angina.  I don't strongly suspect obstructive CAD as the etiology.  I would suggest observation and cycle enzymes.  If repeat EKG and enzymes are negative and he has no further symptoms he could likely be screened with a POET (Plain Old Exercise Treadmill) as an outpatient.   Of note the patient is in the TWILIGHT study.    DM:  A1C as above.  Continue current therapy.    HYPERLIPIDEMIA:   LDL at target.  Continue current therapy.   HTN:   His BP is well controlled.  No change in medications.     SignedMinus Breeding 01/27/2016, 12:08 AM

## 2016-01-27 NOTE — Progress Notes (Signed)
ANTICOAGULATION CONSULT NOTE - Initial Consult  Pharmacy Consult for Heparin  Indication: chest pain/ACS  No Known Allergies  Patient Measurements: Height: 5\' 4"  (162.6 cm) Weight: 166 lb 7.2 oz (75.5 kg) IBW/kg (Calculated) : 59.2 Heparin Dosing Weight: 75 kg  Vital Signs: Temp: 98.7 F (37.1 C) (08/01 0611) Temp Source: Oral (08/01 0611) BP: 141/79 (08/01 0953) Pulse Rate: 79 (08/01 0611)  Labs:  Recent Labs  01/26/16 2240 01/27/16 0232 01/27/16 0441 01/27/16 0819  HGB 14.2  --   --   --   HCT 42.5  --   --   --   PLT 281  --   --   --   CREATININE 0.83  --   --   --   TROPONINI <0.03 0.06* 0.20* 0.54*    Estimated Creatinine Clearance: 84.7 mL/min (by C-G formula based on SCr of 0.83 mg/dL).   Medical History: Past Medical History:  Diagnosis Date  . CAD (coronary artery disease)    a. Cath 06/08/01 at [facility] with normal LM and LAD, 95% LCx s/p Circumflex stent 4.0 x 15 mm Penta and 85% and 80% prox RCA s/p 3.5 x 23 mm Penta b. cath 01/20/2015 95% prox LCx ISR treated with DES, 55% prox to mid RCA, 45% distal RCA, 40% midLAD, EF 55%  . Diabetes mellitus without complication ([place])   . Erectile dysfunction   . Hyperlipidemia   . Hypertension     Medications:  Prescriptions Prior to Admission  Medication Sig Dispense Refill Last Dose  . AMBULATORY NON FORMULARY MEDICATION Take 90 mg by mouth 2 (two) times daily. Medication Name: Brilinta 90 mg BID (provided by TWILIGHT Study)   01/26/2016 at 0730  . AMBULATORY NON FORMULARY MEDICATION Take 81 mg by mouth daily. Medication Name: ASA 81 mg or PLACEBO (Provided by TWILIGHT Research Study)   01/26/2016 at 0730  . amLODipine (NORVASC) 5 MG tablet TAKE 1 TABLET BY MOUTH DAILY 90 tablet 3 01/26/2016 at Unknown time  . atorvastatin (LIPITOR) 80 MG tablet Take 1 tablet (80 mg total) by mouth daily. 90 tablet 3 01/25/2016  . KRILL OIL PO Take 1 tablet by mouth daily.    01/26/2016 at Unknown time  . metoprolol  tartrate (LOPRESSOR) 25 MG tablet TAKE 1 TABLET BY MOUTH DAILY 90 tablet 2 01/26/2016 at 0730  . Multiple Vitamin (MULTIVITAMIN WITH MINERALS) TABS tablet Take 1 tablet by mouth daily.   01/26/2016 at Unknown time  . nicotine polacrilex (NICORETTE) 4 MG gum Take 4 mg by mouth as needed for smoking cessation.   Past Week at Unknown time  . nitroGLYCERIN (NITROSTAT) 0.4 MG SL tablet Place 1 tablet (0.4 mg total) under the tongue every 5 (five) minutes as needed for chest pain. 25 tablet 3 unknown  . ramipril (ALTACE) 10 MG capsule TAKE ONE CAPSULE BY MOUTH TWICE DAILY 60 capsule 11 01/26/2016 at Unknown time  . sildenafil (VIAGRA) 100 MG tablet Take 1 tablet (100 mg total) by mouth daily as needed for erectile dysfunction. 10 tablet 5 unknown  . zolpidem (AMBIEN) 5 MG tablet TAKE 1 TABLET BY MOUTH AT BEDTIME AS NEEDED FOR SLEEP 30 tablet 0 01/25/2016    Assessment: 65 yo male with history of NSTEMI in 2002 presenting with chest pain.  NSTEMI present on EKG with corresponding abnormal troponins 0.03>>0.54. Hemoglobin 14.2 and platelets 281 are stable for initiation of heparin infusion. Of note patient is enrolled in the Twilight study- Brilinta + ASA vs Brilinta alone.  Goal of Therapy:  Heparin level 0.3-0.7 units/ml Monitor platelets by anticoagulation protocol: Yes   Plan:  Give 4000 units bolus x 1 and initiate 900 units/hour infusion Monitor daily labs and provider recommendations F/u 6 hour Hl, Daily HL and CBC  Georga Bora, PharmD Pager: [phone] 01/27/2016,11:19 AM

## 2016-01-27 NOTE — Progress Notes (Signed)
    Subjective:  Denies CP or dyspnea   Objective:  Vitals:   01/27/16 0101 01/27/16 0139 01/27/16 0611 01/27/16 0953  BP: 138/83 (!) 153/77 137/75 (!) 141/79  Pulse: 81 73 79   Resp: 16 18    Temp:  98.7 F (37.1 C) 98.7 F (37.1 C)   TempSrc:  Oral Oral   SpO2: 97% 99% 97%   Weight:  166 lb 7.2 oz (75.5 kg)    Height:  5\' 4"  (1.626 m)      Intake/Output from previous day:  Intake/Output Summary (Last 24 hours) at 01/27/16 1007 Last data filed at 01/27/16 1000  Gross per 24 hour  Intake              250 ml  Output                0 ml  Net              250 ml    Physical Exam: Physical exam: Well-developed well-nourished in no acute distress.  Skin is warm and dry.  HEENT is normal.  Neck is supple.  Chest is clear to auscultation with normal expansion.  Cardiovascular exam is regular rate and rhythm.  Abdominal exam nontender or distended. No masses palpated. Extremities show no edema. neuro grossly intact    Lab Results: Basic Metabolic Panel:  Recent Labs  01/26/16 2240  NA 137  K 3.9  CL 105  CO2 22  GLUCOSE 135*  BUN 15  CREATININE 0.83  CALCIUM 8.8*   CBC:  Recent Labs  01/26/16 2240  WBC 10.1  HGB 14.2  HCT 42.5  MCV 94.0  PLT 281   Cardiac Enzymes:  Recent Labs  01/27/16 0232 01/27/16 0441 01/27/16 0819  TROPONINI 0.06* 0.20* 0.54*     Assessment/Plan:  1 non-ST elevation myocardial infarction-patient's troponins are abnormal. Symptoms similar to prior MI. Plan to continue aspirin, brilinta, statin and beta blocker. Add IV heparin. He will require cardiac catheterization. The risks and benefits were discussed and he agrees to proceed. 2 Hyperlipidemia-continue statin. 3 hypertension-continue present medications. 4 CAD-continue asa, brilinta and statin. Kirk Ruths 01/27/2016, 10:07 AM

## 2016-01-28 DIAGNOSIS — I200 Unstable angina: Secondary | ICD-10-CM | POA: Diagnosis not present

## 2016-01-28 DIAGNOSIS — K810 Acute cholecystitis: Secondary | ICD-10-CM

## 2016-01-28 MED ORDER — TICAGRELOR 90 MG PO TABS: 90.0000 mg | ORAL_TABLET | Freq: Two times a day (BID) | ORAL | 10 refills | Status: DC

## 2016-01-28 MED ORDER — TICAGRELOR 90 MG PO TABS: 90.0000 mg | ORAL_TABLET | Freq: Two times a day (BID) | ORAL | 0 refills | Status: DC

## 2016-01-28 MED ORDER — ASPIRIN 81 MG PO CHEW: 81.0000 mg | CHEWABLE_TABLET | Freq: Once | ORAL | Status: AC

## 2016-01-28 NOTE — Progress Notes (Signed)
    Subjective:  Denies CP or dyspnea; complains of headache   Objective:  Vitals:   01/27/16 1443 01/27/16 1513 01/27/16 2025 01/28/16 0500  BP: 125/78 130/83 130/72 121/67  Pulse: 78 79 87 85  Resp: 19 18 17 18   Temp:   99.5 F (37.5 C) 99.5 F (37.5 C)  TempSrc:   Oral Oral  SpO2: 100% 98% 96% 96%  Weight:    161 lb 8 oz (73.3 kg)  Height:        Intake/Output from previous day:  Intake/Output Summary (Last 24 hours) at 01/28/16 0859 Last data filed at 01/28/16 0700  Gross per 24 hour  Intake            547.5 ml  Output              775 ml  Net           -227.5 ml    Physical Exam: Physical exam: Well-developed well-nourished in no acute distress.  Skin is warm and dry.  HEENT is normal.  Neck is supple.  Chest is clear to auscultation with normal expansion.  Cardiovascular exam is regular rate and rhythm.  Abdominal exam nontender or distended. No masses palpated. Extremities show no edema. neuro grossly intact    Lab Results: Basic Metabolic Panel:  Recent Labs  01/26/16 2240  NA 137  K 3.9  CL 105  CO2 22  GLUCOSE 135*  BUN 15  CREATININE 0.83  CALCIUM 8.8*   CBC:  Recent Labs  01/26/16 2240  WBC 10.1  HGB 14.2  HCT 42.5  MCV 94.0  PLT 281   Cardiac Enzymes:  Recent Labs  01/27/16 0232 01/27/16 0441 01/27/16 0819  TROPONINI 0.06* 0.20* 0.54*     Assessment/Plan:  1 non-ST elevation myocardial infarction-cath results noted; plan medical therapy. Continue aspirin, brilinta, statin and beta blocker. 2 Hyperlipidemia-continue statin. 3 hypertension-continue present medications. 4 CAD-continue asa, brilinta and statin. 5 HA-patient continues to complain of headache; CT shows normal pressure hydrocephalus; will ask neurology to evaluate. DC later today if ok with neurology and fu with me in 4-6 weeks. Kirk Ruths 01/28/2016, 8:59 AM

## 2016-01-28 NOTE — Research (Signed)
Troy Boyer will require DAPT upon discharge. Prior to this admission he was on Brilinta and ASA or Placebo as part of the [facility] research study. He was instructed to stop his study drug (ASA/Placebo) bottle and begin Open label ASA and will be given a script for Brilinta from Cardiologist at discharge. Questions encouraged and answered. The patient has agree to allow telephone follow up for the Remaining @ research visit. Patient verbalized understanding. Any questions regarding the [facility] Research study please call [phone].

## 2016-01-28 NOTE — Progress Notes (Signed)
Pt discharged home. Discharge instructions have been gone over with the patient. IV's removed. Pt given unit number and told to call if they have any concerns regarding their discharge instructions. Latanza Pfefferkorn V, RN   

## 2016-01-28 NOTE — Discharge Summary (Signed)
Discharge Summary    Patient ID: Troy Boyer,  MRN: [id number], DOB/AGE: 1950-12-26 65 y.o.  Admit date: 01/26/2016 Discharge date: 01/28/2016  Primary Care Provider: [name] Primary Cardiologist: Dr. Stanford Breed  Discharge Diagnoses    Active Problems:   Unstable angina [facility])   Precordial chest pain   Allergies No Known Allergies  Diagnostic Studies/Procedures    LHC 01/27/16 Conclusion     Prox RCA to Mid RCA lesion, 20 %stenosed.  Mid RCA lesion, 50 %stenosed.  Ost Cx to Mid Cx lesion, 0 %stenosed.  Mid LAD lesion, 30 %stenosed.  Prox LAD lesion, 25 %stenosed.  The left ventricular systolic function is normal.  LV end diastolic pressure is normal.  The left ventricular ejection fraction is 55-65% by visual estimate.   1. Continued patency of the stented segments in the left circumflex and RCA 2. Moderate mid-RCA stenosis without change from the previous study 3. Patent LAD with mild diffuse irregularity 4. Normal LV systolic function with normal LVEDP  No clear culprit for NSTEMI. Recommend continued medical therapy.       History of Present Illness / Hospital Course    65 y/o male, followed by Dr. Stanford Breed, with a history of CAD with non-ST elevation myocardial infarction in 2002. Cardiac catheterization, at that time, revealed an 80% proximal right coronary artery followed by an 85%. There was a 90% circumflex. LV function was normal. He had PCI of his circumflex and right coronary artery. Nuclear study in July of 2012 showed an ejection fraction of 76%. No ischemia.  He was admitted 7/16 with a NSTEMI. LHC demonstrated pCFX 95% ISR which was treated with a DES. Residual CAD included mLAD 40%, pRCA stent ok with 25% ISR, mRCA 55%, dRCA 45%, RPDA 30%. EF was preserved.   He presented to [facility] ED on 01/27/16 with complaint of chest pain. He was admitted. Cardiac enzymes were abnormal x 3.  Troponin peaked at 0.54 (0.06, 0.20, 0.60). Subsequently, he  underwent a LHC 01/27/16 by Dr. Burt Knack. He was found to have continued patency of the stented segments in the left circumflex and RCA. There was moderate mid-RCA stenosis w/o changed from previous study. His LAD was patent with mild diffuse irregularity. LV systolic function ws normal with normal LVED. There was no clear culprit for his NSTEMI. Continued medical therapy was recommended. He left the cath lab in stable condition. He was continued on ASA, Brilinta, a statin and beta blocker.   Post [place], he complained of a severe HA and visual changes. Subsequently, he was evaluated by head CT which showed moderate chronic appearing hydrocephalus, most compatible with normal pressure hydrocephalus. No intraparenchymal hemorrhage, mass effect, midline shift or acute large vascular territory infarcts. Neurology was consulted. It was felt that his headache was nonspecific though may be most consistent with a tension-type headache given bitemporal location. In regards to CT findings, it was felt that his history did not support a diagnosis of normal pressure hydrocephalus and his neuro exam was also inconsistent with that diagnosis. No further neurologic w/u was indicated. Supportive care for HA with use of NSAIDS and or Tylenol for pain relief was recommended. Per Neurology, if headache persists or if he begins to develop frequent headaches, referral to outpatient neurology for further evaluation is recommended.  The patient was last seen and examined by Dr. Stanford Breed on 01/28/16 who felt he was stable for d/c home. He had no recurrent CP and no post cath complications. Vital signs and renal  function both stable. Dr. Stanford Breed has recommended office f/u with him in 4-6 weeks. This is scheduled for 02/24/13 in [facility].     Consultants: Neurology Recommendations: supportive care with nonsteroidal anti-inflammatories and/or Tylenol for pain relief if needed for HA. If headache persists or if he begins to develop  frequent headaches, referral to outpatient neurology for further evaluation is recommended.   Discharge Vitals Blood pressure 121/67, pulse 85, temperature 99.5 F (37.5 C), temperature source Oral, resp. rate 18, height 5\' 4"  (1.626 m), weight 161 lb 8 oz (73.3 kg), SpO2 96 %.  Filed Weights   01/26/16 2221 01/27/16 0139 01/28/16 0500  Weight: 174 lb (78.9 kg) 166 lb 7.2 oz (75.5 kg) 161 lb 8 oz (73.3 kg)    Labs & Radiologic Studies    CBC  Recent Labs  01/26/16 2240  WBC 10.1  HGB 14.2  HCT 42.5  MCV 94.0  PLT [id number]   Basic Metabolic Panel  Recent Labs  01/26/16 2240  NA 137  K 3.9  CL 105  CO2 22  GLUCOSE 135*  BUN 15  CREATININE 0.83  CALCIUM 8.8*   Liver Function Tests No results for input(s): AST, ALT, ALKPHOS, BILITOT, PROT, ALBUMIN in the last 72 hours. No results for input(s): LIPASE, AMYLASE in the last 72 hours. Cardiac Enzymes  Recent Labs  01/27/16 0232 01/27/16 0441 01/27/16 0819  TROPONINI 0.06* 0.20* 0.54*   BNP Invalid input(s): POCBNP D-Dimer No results for input(s): DDIMER in the last 72 hours. Hemoglobin A1C No results for input(s): HGBA1C in the last 72 hours. Fasting Lipid Panel No results for input(s): CHOL, HDL, LDLCALC, TRIG, CHOLHDL, LDLDIRECT in the last 72 hours. Thyroid Function Tests No results for input(s): TSH, T4TOTAL, T3FREE, THYROIDAB in the last 72 hours.  Invalid input(s): FREET3 _____________  Dg Chest 2 View  Result Date: 01/26/2016 CLINICAL DATA:  65 year old male with chest pain EXAM: CHEST  2 VIEW COMPARISON:  Chest radiograph dated 01/19/2015 FINDINGS: There is no focal consolidation, pleural effusion, or pneumothorax. The cardiac silhouette is within normal limits with no acute osseous pathology. IMPRESSION: No active cardiopulmonary disease. Electronically Signed   By: Anner Crete M.D.   On: 01/26/2016 23:12   Ct Head Wo Contrast  Result Date: 01/27/2016 CLINICAL DATA:  Headache. History of  hypertension, hyperlipidemia, diabetes. EXAM: CT HEAD WITHOUT CONTRAST TECHNIQUE: Contiguous axial images were obtained from the base of the skull through the vertex without intravenous contrast. COMPARISON:  None. FINDINGS: INTRACRANIAL CONTENTS: Moderate ventriculomegaly, with disproportionate sulcal effacement at the convexities. No interstitial/ transependymal edema. No intraparenchymal hemorrhage, mass effect, midline shift or acute large vascular territory infarcts. No abnormal extra-axial fluid collections. Basal cisterns are patent. Mild calcific atherosclerosis of the carotid siphons. ORBITS: The included ocular globes and orbital contents are normal. SINUSES: The mastoid aircells and included paranasal sinuses are well-aerated. SKULL/SOFT TISSUES: No skull fracture. No significant soft tissue swelling. IMPRESSION: Moderate chronic appearing hydrocephalus, most compatible with normal pressure hydrocephalus. Electronically Signed   By: Elon Alas M.D.   On: 01/27/2016 23:37   Disposition   Pt is being discharged home today in good condition.  Follow-up Plans & Appointments    Follow-up Information    Kirk Ruths, MD Follow up on 02/25/2016.   Specialty:  Cardiology Why:  11:15 AM  Contact information: [place] [id number] [phone]          Discharge Instructions    Diet - low  sodium heart healthy    Complete by:  As directed   Increase activity slowly    Complete by:  As directed      Discharge Medications   Current Discharge Medication List    START taking these medications   Details  aspirin 81 MG chewable tablet Chew 1 tablet (81 mg total) by mouth once.    !! ticagrelor (BRILINTA) 90 MG TABS tablet Take 1 tablet (90 mg total) by mouth 2 (two) times daily. Qty: 60 tablet, Refills: 10    !! ticagrelor (BRILINTA) 90 MG TABS tablet Take 1 tablet (90 mg total) by mouth 2 (two) times daily. Qty: 60 tablet, Refills: 0     !!  - Potential duplicate medications found. Please discuss with provider.    CONTINUE these medications which have NOT CHANGED   Details  amLODipine (NORVASC) 5 MG tablet TAKE 1 TABLET BY MOUTH DAILY Qty: 90 tablet, Refills: 3    atorvastatin (LIPITOR) 80 MG tablet Take 1 tablet (80 mg total) by mouth daily. Qty: 90 tablet, Refills: 3   Associated Diagnoses: Coronary artery disease involving native coronary artery of native heart without angina pectoris    KRILL OIL PO Take 1 tablet by mouth daily.     metoprolol tartrate (LOPRESSOR) 25 MG tablet TAKE 1 TABLET BY MOUTH DAILY Qty: 90 tablet, Refills: 2    Multiple Vitamin (MULTIVITAMIN WITH MINERALS) TABS tablet Take 1 tablet by mouth daily.    nicotine polacrilex (NICORETTE) 4 MG gum Take 4 mg by mouth as needed for smoking cessation.    nitroGLYCERIN (NITROSTAT) 0.4 MG SL tablet Place 1 tablet (0.4 mg total) under the tongue every 5 (five) minutes as needed for chest pain. Qty: 25 tablet, Refills: 3   Associated Diagnoses: Coronary artery disease involving native coronary artery of native heart without angina pectoris    ramipril (ALTACE) 10 MG capsule TAKE ONE CAPSULE BY MOUTH TWICE DAILY Qty: 60 capsule, Refills: 11    sildenafil (VIAGRA) 100 MG tablet Take 1 tablet (100 mg total) by mouth daily as needed for erectile dysfunction. Qty: 10 tablet, Refills: 5    zolpidem (AMBIEN) 5 MG tablet TAKE 1 TABLET BY MOUTH AT BEDTIME AS NEEDED FOR SLEEP Qty: 30 tablet, Refills: 0           Outstanding Labs/Studies   none  Duration of Discharge Encounter   Greater than 30 minutes including physician time.  Signed, Lyda Jester PA-C 01/28/2016, 1:09 PM

## 2016-01-28 NOTE — Consult Note (Signed)
Neurology Consult Note  Reason for Consultation: abnormal head CT  Requesting provider: Minus Breeding, MD  CC: headache  HPI: 65-yo RH man admitted for unstable angina. He has now completed his evaluation and is awaiting discharge. He complained of headache and a CT of the head was ordered. This was interpreted as "moderate chronic appearing hydrocephalus, most compatible with normal pressure hydrocephalus." Neurology consult was ordered as a result.  The patient's main concern today is headache. He describes a mild bitemporal headache that he rates as a 1/10 in severity. The headache increases if he coughs. It does not change with position. There is no increase in pain with Valsalva or bending over. He denies any associated photophobia, phonophobia, nausea, or vomiting. He states that the headache started in conjunction with his chest pain. At onset, he felt as if the colors of the television "faded out" for a couple of minutes. He denies any prior history of headache. He has no history of head trauma.  PMH:  Past Medical History:  Diagnosis Date  . CAD (coronary artery disease)    a. Cath 06/08/01 at [facility] with normal LM and LAD, 95% LCx s/p Circumflex stent 4.0 x 15 mm Penta and 85% and 80% prox RCA s/p 3.5 x 23 mm Penta b. cath 01/20/2015 95% prox LCx ISR treated with DES, 55% prox to mid RCA, 45% distal RCA, 40% midLAD, EF 55%  . Diabetes mellitus without complication ([place])   . Erectile dysfunction   . Hyperlipidemia   . Hypertension     PSH:  Past Surgical History:  Procedure Laterality Date  . ANGIOPLASTY  2002   2 stents  . CARDIAC CATHETERIZATION N/A 01/20/2015   Procedure: Left Heart Cath and Coronary Angiography;  Surgeon: Belva Crome, MD;  Location: [facility];  Service: Cardiovascular;  Laterality: N/A;  . CARDIAC CATHETERIZATION N/A 01/27/2016   Procedure: Left Heart Cath and Coronary Angiography;  Surgeon: Sherren Mocha, MD;  Location: [facility];   Service: Cardiovascular;  Laterality: N/A;  . CORONARY ANGIOPLASTY WITH STENT PLACEMENT    . ROTATOR CUFF REPAIR Right 2009  . ROTATOR CUFF REPAIR Left 2014  . TRIGGER FINGER RELEASE  2013   4 fingers    Family history: Family History  Problem Relation Age of Onset  . CAD Brother 92    Died age 32  . CAD Sister 28  . Heart disease Father 77    Valve replacement and CAD, CABG  . Dementia Mother   . Colon cancer Neg Hx     Social history:  He is married and lives with his spouse. He currently works in [employer] for a [employer]. He is a former cigarette smoker, smoking one pack a day for 44 years. He stopped 01/17/2015. He denies smokeless tobacco use. He drinks 2 drinks per night, stating that he does this so that he can fall asleep. He denies illicit drug use.   Current outpatient meds: Current Meds  Medication Sig  . amLODipine (NORVASC) 5 MG tablet TAKE 1 TABLET BY MOUTH DAILY  . atorvastatin (LIPITOR) 80 MG tablet Take 1 tablet (80 mg total) by mouth daily.  Marland Kitchen KRILL OIL PO Take 1 tablet by mouth daily.   . metoprolol tartrate (LOPRESSOR) 25 MG tablet TAKE 1 TABLET BY MOUTH DAILY  . Multiple Vitamin (MULTIVITAMIN WITH MINERALS) TABS tablet Take 1 tablet by mouth daily.  . nicotine polacrilex (NICORETTE) 4 MG gum Take 4 mg by mouth as  needed for smoking cessation.  . nitroGLYCERIN (NITROSTAT) 0.4 MG SL tablet Place 1 tablet (0.4 mg total) under the tongue every 5 (five) minutes as needed for chest pain.  . ramipril (ALTACE) 10 MG capsule TAKE ONE CAPSULE BY MOUTH TWICE DAILY  . sildenafil (VIAGRA) 100 MG tablet Take 1 tablet (100 mg total) by mouth daily as needed for erectile dysfunction.  Marland Kitchen zolpidem (AMBIEN) 5 MG tablet TAKE 1 TABLET BY MOUTH AT BEDTIME AS NEEDED FOR SLEEP  . [DISCONTINUED] AMBULATORY NON FORMULARY MEDICATION Take 90 mg by mouth 2 (two) times daily. Medication Name: Brilinta 90 mg BID (provided by [facility] Study)  . [DISCONTINUED] AMBULATORY NON FORMULARY  MEDICATION Take 81 mg by mouth daily. Medication Name: ASA 81 mg or PLACEBO (Provided by [facility])    Current inpatient meds:  Current Facility-Administered Medications  Medication Dose Route Frequency Provider Last Rate Last Dose  . 0.9 %  sodium chloride infusion  250 mL Intravenous PRN Sherren Mocha, MD      . 0.9 %  sodium chloride infusion   Intravenous Continuous Isaiah Serge, NP   Stopped at 01/27/16 1858  . acetaminophen (TYLENOL) tablet 650 mg  650 mg Oral Q4H PRN Minus Breeding, MD   650 mg at 01/28/16 0853  . amLODipine (NORVASC) tablet 5 mg  5 mg Oral Daily Minus Breeding, MD   5 mg at 01/28/16 0853  . aspirin chewable tablet 81 mg  81 mg Oral Once Minus Breeding, MD      . atorvastatin (LIPITOR) tablet 80 mg  80 mg Oral Daily Minus Breeding, MD   80 mg at 01/27/16 0954  . metoprolol tartrate (LOPRESSOR) tablet 25 mg  25 mg Oral Daily Minus Breeding, MD   25 mg at 01/28/16 0853  . nicotine polacrilex (NICORETTE) gum 4 mg  4 mg Oral PRN Minus Breeding, MD      . nitroGLYCERIN (NITROSTAT) SL tablet 0.4 mg  0.4 mg Sublingual Q5 min PRN Shanon Rosser, MD      . ondansetron [facility]) injection 4 mg  4 mg Intravenous Q6H PRN Minus Breeding, MD      . ramipril (ALTACE) capsule 10 mg  10 mg Oral BID Minus Breeding, MD   10 mg at 01/28/16 0853  . sodium chloride flush (NS) 0.9 % injection 3 mL  3 mL Intravenous Q12H Sherren Mocha, MD   3 mL at 01/28/16 1000  . sodium chloride flush (NS) 0.9 % injection 3 mL  3 mL Intravenous PRN Sherren Mocha, MD      . ticagrelor [facility]) tablet 90 mg  90 mg Oral BID Minus Breeding, MD   90 mg at 01/28/16 0853  . zolpidem (AMBIEN) tablet 5 mg  5 mg Oral QHS PRN Minus Breeding, MD        Allergies: No Known Allergies  ROS: As per HPI. A full 14-point review of systems was performed and is otherwise Notable for some difficulty falling asleep. Review of systems is otherwise unremarkable.  PE:  BP 121/67 (BP Location: Left Arm)    Pulse 85   Temp 99.5 F (37.5 C) (Oral)   Resp 18   Ht 5\' 4"  (1.626 m)   Wt 73.3 kg (161 lb 8 oz)   SpO2 96%   BMI 27.72 kg/m   General: WDWN, no acute distress. AAO x4. Speech clear, no dysarthria. No aphasia. Follows commands briskly. Affect is bright with congruent mood. Comportment is normal. Spontaneous recall is 2 out of  3 items at 5 minutes. He is able to get the third item with semantic cue. He is able to name 15 animals in 60 seconds. He has no problems with tasks of conflicting instructions. He is able spell world backwards without difficulty. HEENT: Normocephalic. Neck supple without LAD. MMM, OP clear. Dentition good. Sclerae anicteric. No conjunctival injection.  CV: Regular, no murmur. Carotid pulses full and symmetric, no bruits. Distal pulses 2+ and symmetric.  Lungs: CTAB.  Abdomen: Soft, non-distended. Extremities: No C/C/E. Neuro:  CN: Pupils are equal and round. They are symmetrically reactive from 3-->2 mm. EOMI with some breakup of smooth pursuits, no nystagmus. No reported diplopia. Facial sensation is intact to light touch. Face is symmetric at rest with normal strength and mobility. Hearing is intact to conversational voice. Palate elevates symmetrically and uvula is midline. Voice is normal in tone, pitch and quality. Bilateral SCM and trapezii are 5/5. Tongue is midline with normal bulk and mobility.  Motor: Normal bulk, tone, and strength. No tremor or other abnormal movements. No drift.  Sensation: Intact to light touch. Vibration and joint position are mildly impaired in both feet.  DTRs: 2+, symmetric. Toes downgoing bilaterally. No pathologic reflexes.  Coordination: Finger-to-nose and heel-to-shin are without dysmetria. Finger taps are normal in amplitude and speed, no decrement.  Gait: Romberg negative. Casual and tandem gaits are normal. Able to walk on heels and toes without difficulty.   Labs:  Lab Results  Component Value Date   WBC 10.1 01/26/2016    HGB 14.2 01/26/2016   HCT 42.5 01/26/2016   PLT 281 01/26/2016   GLUCOSE 135 (H) 01/26/2016   CHOL 151 09/12/2015   TRIG 183 (H) 09/12/2015   HDL 42 09/12/2015   LDLDIRECT 116.0 01/23/2015   LDLCALC 72 09/12/2015   ALT 37 09/12/2015   AST 23 09/12/2015   NA 137 01/26/2016   K 3.9 01/26/2016   CL 105 01/26/2016   CREATININE 0.83 01/26/2016   BUN 15 01/26/2016   CO2 22 01/26/2016   INR 1.23 01/27/2016   HGBA1C 6.7 (H) 10/23/2015   MICROALBUR 0.9 04/24/2015    Imaging:  I have personally and independently reviewed the CT scan of the head without contrast from 01/27/16. This shows mild chronic small vessel ischemic change. There is mild diffuse atrophy with associated hydrocephalus ex vacuo.  Assessment and Plan:  1. Headache: This is nonspecific, though may be most consistent with a tension-type headache given bitemporal location without any associated features. Given supportive care with nonsteroidal anti-inflammatories and/or Tylenol for pain relief if needed, though he is reporting only minimal pain at this time. If headache persists or if he begins to develop frequent headaches, referral to outpatient neurology for further evaluation is recommended.  2. Abnormal CT scan of the head: He does have some slight enlargement of his ventricles but this is most consistent with atrophy. There is nothing on his history to support a diagnosis of normal pressure hydrocephalus. His exam is likewise inconsistent with this diagnosis. No specific recommendations at this time.  This was discussed with the patient at length. He was given the opportunity to ask questions and these were addressed to his satisfaction. I have no additional recommendations at this time and will sign off.

## 2016-01-28 NOTE — Discharge Instructions (Signed)
Angina Pectoris  Angina pectoris, often called angina, is extreme discomfort in the chest, neck, or arm. This is caused by a lack of blood in the middle and thickest layer of the heart wall (myocardium). There are four types of angina:  · Stable angina. Stable angina usually occurs in episodes of predictable frequency and duration. It is usually brought on by physical activity, stress, or excitement. Stable angina usually lasts a few minutes and can often be relieved by a medicine that you place under your tongue. This medicine is called sublingual nitroglycerin.  · Unstable angina. Unstable angina can occur even when you are doing little or no physical activity. It can even occur while you are sleeping or when you are at rest. It can suddenly increase in severity or frequency. It may not be relieved by sublingual nitroglycerin, and it can last up to 30 minutes.  · Microvascular angina. This type of angina is caused by a disorder of tiny blood vessels called arterioles. Microvascular angina is more common in women. The pain may be more severe and last longer than other types of angina pectoris.  · Prinzmetal or variant angina. This type of angina pectoris is rare and usually occurs when you are doing little or no physical activity. It especially occurs in the early morning hours.  CAUSES  Atherosclerosis is the cause of angina. This is the buildup of fat and cholesterol (plaque) on the inside of the arteries. Over time, the plaque may narrow or block the artery, and this will lessen blood flow to the heart. Plaque can also become weak and break off within a coronary artery to form a clot and cause a sudden blockage.  RISK FACTORS  Risk factors common to both men and women include:  · High cholesterol levels.  · High blood pressure (hypertension).  · Tobacco use.  · Diabetes.  · Family history of angina.  · Obesity.  · Lack of exercise.  · A diet high in saturated fats.  Women are at greater risk for angina if they  are:  · Over age 55.  · Postmenopausal.  SYMPTOMS  Many people do not experience any symptoms during the early stages of angina. As the condition progresses, symptoms common to both men and women may include:  · Chest pain.    The pain can be described as a crushing or squeezing in the chest, or a tightness, pressure, fullness, or heaviness in the chest.    The pain can last more than a few minutes, or it can stop and recur.  · Pain in the arms, neck, jaw, or back.  · Unexplained heartburn or indigestion.  · Shortness of breath.  · Nausea.  · Sudden cold sweats.  · Sudden light-headedness.  Many women have chest discomfort and some of the other symptoms. However, women often have different (atypical) symptoms, such as:   · Fatigue.  · Unexplained feelings of nervousness or anxiety.  · Unexplained weakness.  · Dizziness or fainting.  Sometimes, women may have angina without any symptoms.  DIAGNOSIS   Tests to diagnose angina may include:  · ECG (electrocardiogram).  · Exercise stress test. This looks for signs of blockage when the heart is being exercised.  · Pharmacologic stress test. This test looks for signs of blockage when the heart is being stressed with a medicine.  · Blood tests.  · Coronary angiogram. This is a procedure to look at the coronary arteries to see if there is any blockage.    TREATMENT   The treatment of angina may include the following:  · Healthy behavioral changes to reduce or control risk factors.  · Medicine.  · Coronary stenting. A stent helps to keep an artery open.  · Coronary angioplasty. This procedure widens a narrowed or blocked artery.  · Coronary artery bypass surgery. This will allow your blood to pass the blockage (bypass) to reach your heart.  HOME CARE INSTRUCTIONS   · Take medicines only as directed by your health care provider.  · Do not take the following medicines unless your health care provider approves:    Nonsteroidal anti-inflammatory drugs (NSAIDs), such as ibuprofen,  naproxen, or celecoxib.    Vitamin supplements that contain vitamin A, vitamin E, or both.    Hormone replacement therapy that contains estrogen with or without progestin.  · Manage other health conditions such as hypertension and diabetes as directed by your health care provider.  · Follow a heart-healthy diet. A dietitian can help to educate you about healthy food options and changes.  · Use healthy cooking methods such as roasting, grilling, broiling, baking, poaching, steaming, or stir-frying. Talk to a dietitian to learn more about healthy cooking methods.  · Follow an exercise program approved by your health care provider.  · Maintain a healthy weight. Lose weight as approved by your health care provider.  · Plan rest periods when fatigued.  · Learn to manage stress.  · Do not use any tobacco products, including cigarettes, chewing tobacco, or electronic cigarettes. If you need help quitting, ask your health care provider.  · If you drink alcohol, and your health care provider approves, limit your alcohol intake to no more than 1 drink per day. One drink equals 12 ounces of beer, 5 ounces of wine, or 1½ ounces of hard liquor.  · Stop illegal drug use.  · Keep all follow-up visits as directed by your health care provider. This is important.  SEEK IMMEDIATE MEDICAL CARE IF:   · You have pain in your chest, neck, arm, jaw, stomach, or back that lasts more than a few minutes, is recurring, or is unrelieved by taking sublingual nitroglycerin.  · You have profuse sweating without cause.  · You have unexplained:    Heartburn or indigestion.    Shortness of breath or difficulty breathing.    Nausea or vomiting.    Fatigue.    Feelings of nervousness or anxiety.    Weakness.    Diarrhea.  · You have sudden light-headedness or dizziness.  · You faint.  These symptoms may represent a serious problem that is an emergency. Do not wait to see if the symptoms will go away. Get medical help right away. Call your local  emergency services (911 in the [place] Do not drive yourself to the hospital.     This information is not intended to replace advice given to you by your health care provider. Make sure you discuss any questions you have with your health care provider.     Document Released: 06/14/2005 Document Revised: 07/05/2014 Document Reviewed: 10/16/2013  Elsevier Interactive Patient Education ©2016 [employer] Inc[employer]

## 2016-02-06 ENCOUNTER — Encounter: Payer: Self-pay | Admitting: Family

## 2016-02-06 ENCOUNTER — Telehealth: Payer: Self-pay | Admitting: Family

## 2016-02-06 ENCOUNTER — Ambulatory Visit (INDEPENDENT_AMBULATORY_CARE_PROVIDER_SITE_OTHER): Payer: PRIVATE HEALTH INSURANCE | Admitting: Internal Medicine

## 2016-02-06 ENCOUNTER — Encounter: Payer: Self-pay | Admitting: Internal Medicine

## 2016-02-06 DIAGNOSIS — I10 Essential (primary) hypertension: Secondary | ICD-10-CM

## 2016-02-06 DIAGNOSIS — K529 Noninfective gastroenteritis and colitis, unspecified: Secondary | ICD-10-CM | POA: Diagnosis not present

## 2016-02-06 MED ORDER — DIPHENOXYLATE-ATROPINE 2.5-0.025 MG PO TABS: 1.0000 | ORAL_TABLET | Freq: Four times a day (QID) | ORAL | 0 refills | Status: DC | PRN

## 2016-02-06 MED ORDER — KETOROLAC TROMETHAMINE 60 MG/2ML IM SOLN
30.0000 mg | Freq: Once | INTRAMUSCULAR | Status: AC
  Administered 2016-02-06: 30 mg via INTRAMUSCULAR

## 2016-02-06 MED ORDER — ONDANSETRON HCL 4 MG/2ML IJ SOLN
4.0000 mg | Freq: Once | INTRAMUSCULAR | Status: AC
  Administered 2016-02-06: 4 mg via INTRAMUSCULAR

## 2016-02-06 MED ORDER — ONDANSETRON HCL 4 MG PO TABS: 4.0000 mg | ORAL_TABLET | Freq: Three times a day (TID) | ORAL | 0 refills | Status: DC | PRN

## 2016-02-06 NOTE — Addendum Note (Signed)
Addended by: Cresenciano Lick on: 02/06/2016 04:55 PM   Modules accepted: Orders

## 2016-02-06 NOTE — Assessment & Plan Note (Addendum)
Lomotil rx Zofran IM Zofran po Toradol IM for cramps Go to ER if not better tonight OTC Nexium twice a day Rest

## 2016-02-06 NOTE — Telephone Encounter (Signed)
Patient Name: Troy Boyer  DOB: 15-Feb-1951    Initial Comment Caller states c/o nausea, vomiting, diarrhea and weakness.    Nurse Assessment  Nurse: Raphael Gibney, RN, Vanita Ingles Date/Time (Eastern Time): 02/06/2016 11:51:21 AM  Confirm and document reason for call. If symptomatic, describe symptoms. You must click the next button to save text entered. ---Caller states he has nausea and vomiting. also has diarrhea. Symptoms started on Wednesday. vomiting is worse. he has had small amount of diarrhea. He had [employer] for lunch on Wednesday. Vomiting started on Wednesday night. Thursday, he felt better in the am. Had 3 chicken fingers for lunch yesterday and started vomiting again in the afternoon. Vomited from 2 pm until 5 pm. Woke up this am and drank half cup of coffee. Started feeling nauseated again around 11 am today. Last night, he felt like he had a knot in his stomach. He sweated yesterday afternoon and last night he had chills but no fever. He has urinated today. Started vomiting again while I was on the phone.  Has the patient traveled out of the country within the last 30 days? ---No  Does the patient have any new or worsening symptoms? ---Yes  Will a triage be completed? ---Yes  Related visit to physician within the last 2 weeks? ---No  Does the PT have any chronic conditions? (i.e. diabetes, asthma, etc.) ---Yes  List chronic conditions. ---prediabetes  Is this a behavioral health or substance abuse call? ---No     Guidelines    Guideline Title Affirmed Question Affirmed Notes  Vomiting [1] SEVERE vomiting (e.g., 6 or more times/day, vomits everything) BUT [2] hydrated (all triage questions negative)    Final Disposition User   See Physician within 24 Hours Luther, RN, Vanita Ingles    Comments  triage outcome upgraded to see physician within 24 hrs as vomiting started again while I was on the phone and he spent 4 hrs yesterday vomiting.  appt acheduled for 02/06/2016 at 1:15 am with Dr. Alain Marion   appt scheduled for 02/06/16 at 1:15 pm with Dr. Alain Marion   Referrals  REFERRED TO PCP OFFICE   Disagree/Comply: Comply

## 2016-02-06 NOTE — Assessment & Plan Note (Signed)
Hold Ramipril

## 2016-02-06 NOTE — Progress Notes (Signed)
Pre visit review using our clinic review tool, if applicable. No additional management support is needed unless otherwise documented below in the visit note. 

## 2016-02-06 NOTE — Patient Instructions (Signed)
Go to ER if not better tonight OTC Nexium twice a day Hold Ramipril and Lipitor for a couple days   Dehydration, Adult Dehydration is a condition in which you do not have enough fluid or water in your body. It happens when you take in less fluid than you lose. Vital organs such as the kidneys, brain, and heart cannot function without a proper amount of fluids. Any loss of fluids from the body can cause dehydration.  Dehydration can range from mild to severe. This condition should be treated right away to help prevent it from becoming severe. CAUSES  This condition may be caused by:  Vomiting.  Diarrhea.  Excessive sweating, such as when exercising in hot or humid weather.  Not drinking enough fluid during strenuous exercise or during an illness.  Excessive urine output.  Fever.  Certain medicines. RISK FACTORS This condition is more likely to develop in:  People who are taking certain medicines that cause the body to lose excess fluid (diuretics).   People who have a chronic illness, such as diabetes, that may increase urination.  Older adults.   People who live at high altitudes.   People who participate in endurance sports.  SYMPTOMS  Mild Dehydration  Thirst.  Dry lips.  Slightly dry mouth.  Dry, warm skin. Moderate Dehydration  Very dry mouth.   Muscle cramps.   Dark urine and decreased urine production.   Decreased tear production.   Headache.   Light-headedness, especially when you stand up from a sitting position.  Severe Dehydration  Changes in skin.   Cold and clammy skin.   Skin does not spring back quickly when lightly pinched and released.   Changes in body fluids.   Extreme thirst.   No tears.   Not able to sweat when body temperature is high, such as in hot weather.   Minimal urine production.   Changes in vital signs.   Rapid, weak pulse (more than 100 beats per minute when you are sitting still).    Rapid breathing.   Low blood pressure.   Other changes.   Sunken eyes.   Cold hands and feet.   Confusion.  Lethargy and difficulty being awakened.  Fainting (syncope).   Short-term weight loss.   Unconsciousness. DIAGNOSIS  This condition may be diagnosed based on your symptoms. You may also have tests to determine how severe your dehydration is. These tests may include:   Urine tests.   Blood tests.  TREATMENT  Treatment for this condition depends on the severity. Mild or moderate dehydration can often be treated at home. Treatment should be started right away. Do not wait until dehydration becomes severe. Severe dehydration needs to be treated at the hospital. Treatment for Mild Dehydration  Drinking plenty of water to replace the fluid you have lost.   Replacing minerals in your blood (electrolytes) that you may have lost.  Treatment for Moderate Dehydration  Consuming oral rehydration solution (ORS). Treatment for Severe Dehydration  Receiving fluid through an IV tube.   Receiving electrolyte solution through a feeding tube that is passed through your nose and into your stomach (nasogastric tube or NG tube).  Correcting any abnormalities in electrolytes. HOME CARE INSTRUCTIONS   Drink enough fluid to keep your urine clear or pale yellow.   Drink water or fluid slowly by taking small sips. You can also try sucking on ice cubes.  Have food or beverages that contain electrolytes. Examples include bananas and sports drinks.  Take over-the-counter  and prescription medicines only as told by your health care provider.   Prepare ORS according to the manufacturer's instructions. Take sips of ORS every 5 minutes until your urine returns to normal.  If you have vomiting or diarrhea, continue to try to drink water, ORS, or both.   If you have diarrhea, avoid:   Beverages that contain caffeine.   Fruit juice.   Milk.   Carbonated soft  drinks.  Do not take salt tablets. This can lead to the condition of having too much sodium in your body (hypernatremia).  SEEK MEDICAL CARE IF:  You cannot eat or drink without vomiting.  You have had moderate diarrhea during a period of more than 24 hours.  You have a fever. SEEK IMMEDIATE MEDICAL CARE IF:   You have extreme thirst.  You have severe diarrhea.  You have not urinated in 6-8 hours, or you have urinated only a small amount of very dark urine.  You have shriveled skin.  You are dizzy, confused, or both.   This information is not intended to replace advice given to you by your health care provider. Make sure you discuss any questions you have with your health care provider.   Document Released: 06/14/2005 Document Revised: 03/05/2015 Document Reviewed: 10/30/2014 Elsevier Interactive Patient Education 2016 Elsevier Inc.   Nausea and Vomiting Nausea is a sick feeling that often comes before throwing up (vomiting). Vomiting is a reflex where stomach contents come out of your mouth. Vomiting can cause severe loss of body fluids (dehydration). Children and elderly adults can become dehydrated quickly, especially if they also have diarrhea. Nausea and vomiting are symptoms of a condition or disease. It is important to find the cause of your symptoms. CAUSES   Direct irritation of the stomach lining. This irritation can result from increased acid production (gastroesophageal reflux disease), infection, food poisoning, taking certain medicines (such as nonsteroidal anti-inflammatory drugs), alcohol use, or tobacco use.  Signals from the brain.These signals could be caused by a headache, heat exposure, an inner ear disturbance, increased pressure in the brain from injury, infection, a tumor, or a concussion, pain, emotional stimulus, or metabolic problems.  An obstruction in the gastrointestinal tract (bowel obstruction).  Illnesses such as diabetes, hepatitis,  gallbladder problems, appendicitis, kidney problems, cancer, sepsis, atypical symptoms of a heart attack, or eating disorders.  Medical treatments such as chemotherapy and radiation.  Receiving medicine that makes you sleep (general anesthetic) during surgery. DIAGNOSIS Your caregiver may ask for tests to be done if the problems do not improve after a few days. Tests may also be done if symptoms are severe or if the reason for the nausea and vomiting is not clear. Tests may include:  Urine tests.  Blood tests.  Stool tests.  Cultures (to look for evidence of infection).  X-rays or other imaging studies. Test results can help your caregiver make decisions about treatment or the need for additional tests. TREATMENT You need to stay well hydrated. Drink frequently but in small amounts.You may wish to drink water, sports drinks, clear broth, or eat frozen ice pops or gelatin dessert to help stay hydrated.When you eat, eating slowly may help prevent nausea.There are also some antinausea medicines that may help prevent nausea. HOME CARE INSTRUCTIONS   Take all medicine as directed by your caregiver.  If you do not have an appetite, do not force yourself to eat. However, you must continue to drink fluids.  If you have an appetite, eat a normal diet  unless your caregiver tells you differently.  Eat a variety of complex carbohydrates (rice, wheat, potatoes, bread), lean meats, yogurt, fruits, and vegetables.  Avoid high-fat foods because they are more difficult to digest.  Drink enough water and fluids to keep your urine clear or pale yellow.  If you are dehydrated, ask your caregiver for specific rehydration instructions. Signs of dehydration may include:  Severe thirst.  Dry lips and mouth.  Dizziness.  Dark urine.  Decreasing urine frequency and amount.  Confusion.  Rapid breathing or pulse. SEEK IMMEDIATE MEDICAL CARE IF:   You have blood or brown flecks (like coffee  grounds) in your vomit.  You have black or bloody stools.  You have a severe headache or stiff neck.  You are confused.  You have severe abdominal pain.  You have chest pain or trouble breathing.  You do not urinate at least once every 8 hours.  You develop cold or clammy skin.  You continue to vomit for longer than 24 to 48 hours.  You have a fever. MAKE SURE YOU:   Understand these instructions.  Will watch your condition.  Will get help right away if you are not doing well or get worse.   This information is not intended to replace advice given to you by your health care provider. Make sure you discuss any questions you have with your health care provider.   Document Released: 06/14/2005 Document Revised: 09/06/2011 Document Reviewed: 11/11/2010 Elsevier Interactive Patient Education 2016 [place] gastroenteritis patient instructions here.  Rest

## 2016-02-06 NOTE — Progress Notes (Signed)
Subjective:  Patient ID: Troy Boyer, male    DOB: 12-29-1950  Age: 65 y.o. MRN: [id number]  CC: Emesis (x 2 days) and Diarrhea (on Wednesday)   HPI Troy Boyer presents for n/v/d 2 d ago ate at [place] and 6 h later started with n/v/d Worse this  am  Outpatient Medications Prior to Visit  Medication Sig Dispense Refill  . amLODipine (NORVASC) 5 MG tablet TAKE 1 TABLET BY MOUTH DAILY 90 tablet 3  . atorvastatin (LIPITOR) 80 MG tablet Take 1 tablet (80 mg total) by mouth daily. 90 tablet 3  . KRILL OIL PO Take 1 tablet by mouth daily.     . metoprolol tartrate (LOPRESSOR) 25 MG tablet TAKE 1 TABLET BY MOUTH DAILY 90 tablet 2  . Multiple Vitamin (MULTIVITAMIN WITH MINERALS) TABS tablet Take 1 tablet by mouth daily.    . nicotine polacrilex (NICORETTE) 4 MG gum Take 4 mg by mouth as needed for smoking cessation.    . nitroGLYCERIN (NITROSTAT) 0.4 MG SL tablet Place 1 tablet (0.4 mg total) under the tongue every 5 (five) minutes as needed for chest pain. 25 tablet 3  . ramipril (ALTACE) 10 MG capsule TAKE ONE CAPSULE BY MOUTH TWICE DAILY 60 capsule 11  . sildenafil (VIAGRA) 100 MG tablet Take 1 tablet (100 mg total) by mouth daily as needed for erectile dysfunction. 10 tablet 5  . ticagrelor (BRILINTA) 90 MG TABS tablet Take 1 tablet (90 mg total) by mouth 2 (two) times daily. 60 tablet 10  . ticagrelor (BRILINTA) 90 MG TABS tablet Take 1 tablet (90 mg total) by mouth 2 (two) times daily. 60 tablet 0  . zolpidem (AMBIEN) 5 MG tablet TAKE 1 TABLET BY MOUTH AT BEDTIME AS NEEDED FOR SLEEP 30 tablet 0   No facility-administered medications prior to visit.     ROS Review of Systems  Constitutional: Positive for fatigue. Negative for appetite change and unexpected weight change.  HENT: Negative for congestion, nosebleeds, sneezing, sore throat and trouble swallowing.   Eyes: Negative for itching and visual disturbance.  Respiratory: Negative for cough.   Cardiovascular: Negative for  chest pain, palpitations and leg swelling.  Gastrointestinal: Positive for abdominal distention, abdominal pain, diarrhea, nausea and vomiting.  Genitourinary: Negative for frequency and hematuria.  Musculoskeletal: Negative for back pain, gait problem, joint swelling and neck pain.  Skin: Negative for rash.  Neurological: Negative for dizziness, tremors, speech difficulty and weakness.  Psychiatric/Behavioral: Negative for agitation, dysphoric mood and sleep disturbance. The patient is not nervous/anxious.     Objective:  BP (!) 170/100   Pulse 67   Temp 98.5 F (36.9 C) (Oral)   Wt 162 lb (73.5 kg)   SpO2 98%   BMI 27.81 kg/m   BP Readings from Last 3 Encounters:  02/06/16 (!) 170/100  01/28/16 121/67  10/23/15 130/82    Wt Readings from Last 3 Encounters:  02/06/16 162 lb (73.5 kg)  01/28/16 161 lb 8 oz (73.3 kg)  10/23/15 174 lb (78.9 kg)    Physical Exam  Constitutional: He is oriented to person, place, and time. He appears well-developed. He appears distressed.  NAD  HENT:  Mouth/Throat: Oropharynx is clear and moist.  Eyes: Conjunctivae are normal. Pupils are equal, round, and reactive to light.  Neck: Normal range of motion. No JVD present. No thyromegaly present.  Cardiovascular: Normal rate, regular rhythm, normal heart sounds and intact distal pulses.  Exam reveals no gallop and no friction rub.   No  murmur heard. Pulmonary/Chest: Effort normal and breath sounds normal. No respiratory distress. He has no wheezes. He has no rales. He exhibits no tenderness.  Abdominal: Soft. Bowel sounds are normal. He exhibits no distension and no mass. There is no tenderness. There is no rebound and no guarding.  Musculoskeletal: Normal range of motion. He exhibits no edema or tenderness.  Lymphadenopathy:    He has no cervical adenopathy.  Neurological: He is alert and oriented to person, place, and time. He has normal reflexes. No cranial nerve deficit. He exhibits normal  muscle tone. He displays a negative Romberg sign. Coordination and gait normal.  Skin: Skin is warm and dry. No rash noted.  Psychiatric: He has a normal mood and affect. His behavior is normal. Judgment and thought content normal.    Lab Results  Component Value Date   WBC 10.1 01/26/2016   HGB 14.2 01/26/2016   HCT 42.5 01/26/2016   PLT 281 01/26/2016   GLUCOSE 135 (H) 01/26/2016   CHOL 151 09/12/2015   TRIG 183 (H) 09/12/2015   HDL 42 09/12/2015   LDLDIRECT 116.0 01/23/2015   LDLCALC 72 09/12/2015   ALT 37 09/12/2015   AST 23 09/12/2015   NA 137 01/26/2016   K 3.9 01/26/2016   CL 105 01/26/2016   CREATININE 0.83 01/26/2016   BUN 15 01/26/2016   CO2 22 01/26/2016   INR 1.23 01/27/2016   HGBA1C 6.7 (H) 10/23/2015   MICROALBUR 0.9 04/24/2015    Dg Chest 2 View  Result Date: 01/26/2016 CLINICAL DATA:  65 year old male with chest pain EXAM: CHEST  2 VIEW COMPARISON:  Chest radiograph dated 01/19/2015 FINDINGS: There is no focal consolidation, pleural effusion, or pneumothorax. The cardiac silhouette is within normal limits with no acute osseous pathology. IMPRESSION: No active cardiopulmonary disease. Electronically Signed   By: Anner Crete M.D.   On: 01/26/2016 23:12   Ct Head Wo Contrast  Result Date: 01/27/2016 CLINICAL DATA:  Headache. History of hypertension, hyperlipidemia, diabetes. EXAM: CT HEAD WITHOUT CONTRAST TECHNIQUE: Contiguous axial images were obtained from the base of the skull through the vertex without intravenous contrast. COMPARISON:  None. FINDINGS: INTRACRANIAL CONTENTS: Moderate ventriculomegaly, with disproportionate sulcal effacement at the convexities. No interstitial/ transependymal edema. No intraparenchymal hemorrhage, mass effect, midline shift or acute large vascular territory infarcts. No abnormal extra-axial fluid collections. Basal cisterns are patent. Mild calcific atherosclerosis of the carotid siphons. ORBITS: The included ocular globes and  orbital contents are normal. SINUSES: The mastoid aircells and included paranasal sinuses are well-aerated. SKULL/SOFT TISSUES: No skull fracture. No significant soft tissue swelling. IMPRESSION: Moderate chronic appearing hydrocephalus, most compatible with normal pressure hydrocephalus. Electronically Signed   By: Elon Alas M.D.   On: 01/27/2016 23:37    Assessment & Plan:   There are no diagnoses linked to this encounter. I am having Mr. Marra maintain his KRILL OIL PO, nitroGLYCERIN, sildenafil, ramipril, nicotine polacrilex, atorvastatin, metoprolol tartrate, amLODipine, zolpidem, multivitamin with minerals, ticagrelor, ticagrelor, and aspirin EC.  Meds ordered this encounter  Medications  . aspirin EC 81 MG tablet    Sig: Take 81 mg by mouth daily.     Follow-up: No Follow-up on file.  Walker Kehr, MD

## 2016-02-07 ENCOUNTER — Emergency Department (HOSPITAL_BASED_OUTPATIENT_CLINIC_OR_DEPARTMENT_OTHER): Payer: No Typology Code available for payment source

## 2016-02-07 ENCOUNTER — Inpatient Hospital Stay (HOSPITAL_BASED_OUTPATIENT_CLINIC_OR_DEPARTMENT_OTHER)
Admission: EM | Admit: 2016-02-07 | Discharge: 2016-02-14 | DRG: 418 | Disposition: A | Discharge status: Home / Self Care | Payer: No Typology Code available for payment source | Attending: Internal Medicine | Admitting: Internal Medicine

## 2016-02-07 ENCOUNTER — Encounter (HOSPITAL_BASED_OUTPATIENT_CLINIC_OR_DEPARTMENT_OTHER): Payer: Self-pay | Admitting: *Deleted

## 2016-02-07 DIAGNOSIS — E785 Hyperlipidemia, unspecified: Secondary | ICD-10-CM | POA: Diagnosis present

## 2016-02-07 DIAGNOSIS — R0789 Other chest pain: Secondary | ICD-10-CM | POA: Diagnosis not present

## 2016-02-07 DIAGNOSIS — Z01818 Encounter for other preprocedural examination: Secondary | ICD-10-CM

## 2016-02-07 DIAGNOSIS — Z01810 Encounter for preprocedural cardiovascular examination: Secondary | ICD-10-CM

## 2016-02-07 DIAGNOSIS — K819 Cholecystitis, unspecified: Secondary | ICD-10-CM | POA: Diagnosis present

## 2016-02-07 DIAGNOSIS — R1011 Right upper quadrant pain: Secondary | ICD-10-CM | POA: Diagnosis not present

## 2016-02-07 DIAGNOSIS — R7989 Other specified abnormal findings of blood chemistry: Secondary | ICD-10-CM | POA: Diagnosis present

## 2016-02-07 DIAGNOSIS — Z87891 Personal history of nicotine dependence: Secondary | ICD-10-CM

## 2016-02-07 DIAGNOSIS — Z7982 Long term (current) use of aspirin: Secondary | ICD-10-CM | POA: Diagnosis not present

## 2016-02-07 DIAGNOSIS — I10 Essential (primary) hypertension: Secondary | ICD-10-CM | POA: Diagnosis present

## 2016-02-07 DIAGNOSIS — Z955 Presence of coronary angioplasty implant and graft: Secondary | ICD-10-CM

## 2016-02-07 DIAGNOSIS — E119 Type 2 diabetes mellitus without complications: Secondary | ICD-10-CM | POA: Diagnosis present

## 2016-02-07 DIAGNOSIS — K8309 Other cholangitis: Secondary | ICD-10-CM | POA: Diagnosis present

## 2016-02-07 DIAGNOSIS — I2510 Atherosclerotic heart disease of native coronary artery without angina pectoris: Secondary | ICD-10-CM | POA: Diagnosis present

## 2016-02-07 DIAGNOSIS — Z79899 Other long term (current) drug therapy: Secondary | ICD-10-CM

## 2016-02-07 DIAGNOSIS — Z6827 Body mass index (BMI) 27.0-27.9, adult: Secondary | ICD-10-CM

## 2016-02-07 DIAGNOSIS — Z7902 Long term (current) use of antithrombotics/antiplatelets: Secondary | ICD-10-CM | POA: Diagnosis not present

## 2016-02-07 DIAGNOSIS — K810 Acute cholecystitis: Secondary | ICD-10-CM | POA: Diagnosis present

## 2016-02-07 DIAGNOSIS — R079 Chest pain, unspecified: Secondary | ICD-10-CM | POA: Diagnosis present

## 2016-02-07 DIAGNOSIS — E663 Overweight: Secondary | ICD-10-CM | POA: Diagnosis present

## 2016-02-07 DIAGNOSIS — K8050 Calculus of bile duct without cholangitis or cholecystitis without obstruction: Secondary | ICD-10-CM

## 2016-02-07 DIAGNOSIS — K8062 Calculus of gallbladder and bile duct with acute cholecystitis without obstruction: Secondary | ICD-10-CM | POA: Diagnosis present

## 2016-02-07 DIAGNOSIS — K8040 Calculus of bile duct with cholecystitis, unspecified, without obstruction: Secondary | ICD-10-CM | POA: Diagnosis not present

## 2016-02-07 DIAGNOSIS — E782 Mixed hyperlipidemia: Secondary | ICD-10-CM | POA: Diagnosis present

## 2016-02-07 DIAGNOSIS — R072 Precordial pain: Secondary | ICD-10-CM | POA: Diagnosis not present

## 2016-02-07 DIAGNOSIS — R945 Abnormal results of liver function studies: Secondary | ICD-10-CM

## 2016-02-07 DIAGNOSIS — Z419 Encounter for procedure for purposes other than remedying health state, unspecified: Secondary | ICD-10-CM

## 2016-02-07 DIAGNOSIS — D62 Acute posthemorrhagic anemia: Secondary | ICD-10-CM | POA: Diagnosis not present

## 2016-02-07 DIAGNOSIS — K8042 Calculus of bile duct with acute cholecystitis without obstruction: Secondary | ICD-10-CM

## 2016-02-07 DIAGNOSIS — E1165 Type 2 diabetes mellitus with hyperglycemia: Secondary | ICD-10-CM

## 2016-02-07 LAB — COMPREHENSIVE METABOLIC PANEL
ALBUMIN: 4.2 g/dL (ref 3.5–5.0)
ALT: 181 U/L — ABNORMAL HIGH (ref 17–63)
ANION GAP: 11 (ref 5–15)
AST: 45 U/L — ABNORMAL HIGH (ref 15–41)
Alkaline Phosphatase: 388 U/L — ABNORMAL HIGH (ref 38–126)
BUN: 11 mg/dL (ref 6–20)
CHLORIDE: 97 mmol/L — AB (ref 101–111)
CO2: 26 mmol/L (ref 22–32)
Calcium: 9.2 mg/dL (ref 8.9–10.3)
Creatinine, Ser: 0.95 mg/dL (ref 0.61–1.24)
GFR calc non Af Amer: >60 mL/min (ref >60)
GLUCOSE: 162 mg/dL — AB (ref 65–99)
POTASSIUM: 3.9 mmol/L (ref 3.5–5.1)
SODIUM: 134 mmol/L — AB (ref 135–145)
Total Bilirubin: 0.9 mg/dL (ref 0.3–1.2)
Total Protein: 7.9 g/dL (ref 6.5–8.1)

## 2016-02-07 LAB — GLUCOSE, CAPILLARY
GLUCOSE-CAPILLARY: 145 mg/dL — AB (ref 65–99)
GLUCOSE-CAPILLARY: 131 mg/dL — AB (ref 65–99)

## 2016-02-07 LAB — CBC WITH DIFFERENTIAL/PLATELET
BASOS PCT: 0 %
Basophils Absolute: 0 10*3/uL (ref 0.0–0.1)
EOS ABS: 0 10*3/uL (ref 0.0–0.7)
EOS PCT: 0 %
HCT: 45.4 % (ref 39.0–52.0)
Hemoglobin: 15.4 g/dL (ref 13.0–17.0)
LYMPHS ABS: 1.4 10*3/uL (ref 0.7–4.0)
Lymphocytes Relative: 9 %
MCH: 31.3 pg (ref 26.0–34.0)
MCHC: 33.9 g/dL (ref 30.0–36.0)
MCV: 92.3 fL (ref 78.0–100.0)
Monocytes Absolute: 1.2 10*3/uL — ABNORMAL HIGH (ref 0.1–1.0)
Monocytes Relative: 8 %
NEUTROS ABS: 12.1 10*3/uL — AB (ref 1.7–7.7)
NEUTROS PCT: 83 %
PLATELETS: 368 10*3/uL (ref 150–400)
RBC: 4.92 MIL/uL (ref 4.22–5.81)
RDW: 13 % (ref 11.5–15.5)
WBC: 14.7 10*3/uL — AB (ref 4.0–10.5)

## 2016-02-07 LAB — TROPONIN I

## 2016-02-07 LAB — LIPASE, BLOOD: Lipase: 30 U/L (ref 11–51)

## 2016-02-07 MED ORDER — KETOROLAC TROMETHAMINE 30 MG/ML IJ SOLN
30.0000 mg | Freq: Four times a day (QID) | INTRAMUSCULAR | Status: AC | PRN
  Administered 2016-02-07 – 2016-02-12 (×6): 30 mg via INTRAVENOUS
  Filled 2016-02-07 (×6): qty 1

## 2016-02-07 MED ORDER — NITROGLYCERIN 0.4 MG SL SUBL: 0.4000 mg | SUBLINGUAL_TABLET | SUBLINGUAL | Status: DC | PRN

## 2016-02-07 MED ORDER — SODIUM CHLORIDE 0.9 % IV SOLN
INTRAVENOUS | Status: DC
  Administered 2016-02-07 – 2016-02-13 (×10): via INTRAVENOUS

## 2016-02-07 MED ORDER — MORPHINE SULFATE (PF) 4 MG/ML IV SOLN
4.0000 mg | Freq: Once | INTRAVENOUS | Status: AC
Start: 2016-02-07 — End: 2016-02-07
  Administered 2016-02-07: 4 mg via INTRAVENOUS
  Filled 2016-02-07: qty 1

## 2016-02-07 MED ORDER — ACETAMINOPHEN 650 MG RE SUPP: 650.0000 mg | Freq: Four times a day (QID) | RECTAL | Status: DC | PRN

## 2016-02-07 MED ORDER — ATORVASTATIN CALCIUM 80 MG PO TABS
80.0000 mg | ORAL_TABLET | Freq: Every day | ORAL | Status: DC
  Administered 2016-02-08 – 2016-02-13 (×6): 80 mg via ORAL
  Filled 2016-02-07 (×8): qty 1

## 2016-02-07 MED ORDER — RAMIPRIL 10 MG PO CAPS
10.0000 mg | ORAL_CAPSULE | Freq: Two times a day (BID) | ORAL | Status: DC
  Administered 2016-02-08 – 2016-02-14 (×13): 10 mg via ORAL
  Filled 2016-02-07 (×14): qty 1

## 2016-02-07 MED ORDER — HYDROCODONE-ACETAMINOPHEN 5-325 MG PO TABS
1.0000 | ORAL_TABLET | ORAL | Status: DC | PRN
  Administered 2016-02-08 – 2016-02-14 (×18): 2 via ORAL
  Filled 2016-02-07 (×19): qty 2

## 2016-02-07 MED ORDER — AMLODIPINE BESYLATE 5 MG PO TABS
5.0000 mg | ORAL_TABLET | Freq: Every day | ORAL | Status: DC
  Administered 2016-02-08 – 2016-02-14 (×7): 5 mg via ORAL
  Filled 2016-02-07 (×7): qty 1

## 2016-02-07 MED ORDER — METOPROLOL TARTRATE 25 MG PO TABS
25.0000 mg | ORAL_TABLET | Freq: Every day | ORAL | Status: DC
Start: 2016-02-07 — End: 2016-02-14
  Administered 2016-02-07 – 2016-02-14 (×8): 25 mg via ORAL
  Filled 2016-02-07 (×8): qty 1

## 2016-02-07 MED ORDER — SODIUM CHLORIDE 0.9 % IV BOLUS (SEPSIS)
1000.0000 mL | Freq: Once | INTRAVENOUS | Status: AC
  Administered 2016-02-07: 1000 mL via INTRAVENOUS

## 2016-02-07 MED ORDER — CETYLPYRIDINIUM CHLORIDE 0.05 % MT LIQD
7.0000 mL | Freq: Two times a day (BID) | OROMUCOSAL | Status: DC
  Administered 2016-02-07 – 2016-02-13 (×6): 7 mL via OROMUCOSAL

## 2016-02-07 MED ORDER — ONDANSETRON HCL 4 MG/2ML IJ SOLN
4.0000 mg | Freq: Once | INTRAMUSCULAR | Status: AC
  Administered 2016-02-07: 4 mg via INTRAVENOUS
  Filled 2016-02-07: qty 2

## 2016-02-07 MED ORDER — MORPHINE SULFATE (PF) 4 MG/ML IV SOLN
4.0000 mg | Freq: Once | INTRAVENOUS | Status: AC
  Administered 2016-02-07: 4 mg via INTRAVENOUS
  Filled 2016-02-07: qty 1

## 2016-02-07 MED ORDER — ZOLPIDEM TARTRATE 5 MG PO TABS: 5.0000 mg | ORAL_TABLET | Freq: Every evening | ORAL | Status: DC | PRN

## 2016-02-07 MED ORDER — ONDANSETRON HCL 4 MG PO TABS: 4.0000 mg | ORAL_TABLET | Freq: Four times a day (QID) | ORAL | Status: DC | PRN

## 2016-02-07 MED ORDER — ACETAMINOPHEN 325 MG PO TABS: 650.0000 mg | ORAL_TABLET | Freq: Four times a day (QID) | ORAL | Status: DC | PRN

## 2016-02-07 MED ORDER — INSULIN ASPART 100 UNIT/ML ~~LOC~~ SOLN
0.0000 [IU] | Freq: Three times a day (TID) | SUBCUTANEOUS | Status: DC
  Administered 2016-02-07 – 2016-02-09 (×3): 1 [IU] via SUBCUTANEOUS
  Administered 2016-02-10: 2 [IU] via SUBCUTANEOUS
  Administered 2016-02-10 (×2): 1 [IU] via SUBCUTANEOUS
  Administered 2016-02-11: 2 [IU] via SUBCUTANEOUS
  Administered 2016-02-11: 1 [IU] via SUBCUTANEOUS
  Administered 2016-02-12: 2 [IU] via SUBCUTANEOUS
  Administered 2016-02-13: 1 [IU] via SUBCUTANEOUS
  Administered 2016-02-13: 3 [IU] via SUBCUTANEOUS
  Administered 2016-02-13: 1 [IU] via SUBCUTANEOUS
  Administered 2016-02-14: 2 [IU] via SUBCUTANEOUS

## 2016-02-07 MED ORDER — ONDANSETRON HCL 4 MG/2ML IJ SOLN
4.0000 mg | Freq: Four times a day (QID) | INTRAMUSCULAR | Status: DC | PRN
  Administered 2016-02-07 (×2): 4 mg via INTRAVENOUS
  Filled 2016-02-07 (×2): qty 2

## 2016-02-07 MED ORDER — SODIUM CHLORIDE 0.9 % IV SOLN
Freq: Once | INTRAVENOUS | Status: AC
  Administered 2016-02-07: 13:00:00 via INTRAVENOUS

## 2016-02-07 MED ORDER — DEXTROSE 5 % IV SOLN
2.0000 g | INTRAVENOUS | Status: DC
  Administered 2016-02-08: 2 g via INTRAVENOUS
  Filled 2016-02-07: qty 2

## 2016-02-07 MED ORDER — DEXTROSE 5 % IV SOLN
2.0000 g | Freq: Once | INTRAVENOUS | Status: AC
  Administered 2016-02-07: 2 g via INTRAVENOUS
  Filled 2016-02-07: qty 2

## 2016-02-07 MED ORDER — SODIUM CHLORIDE 0.9% FLUSH
3.0000 mL | Freq: Two times a day (BID) | INTRAVENOUS | Status: DC
  Administered 2016-02-12 – 2016-02-13 (×2): 3 mL via INTRAVENOUS

## 2016-02-07 NOTE — Consult Note (Signed)
Primary cardiologist: Dr Kirk Ruths Consulting cardiologist: Dr Carlyle Dolly D Requesting physician: Dr Daiva Nakayama Indication: CAD, preoperative evaluation  Clinical Summary Mr. Troy Boyer is a 65 y.o.male history of NSTEMI 2002, received stents to LCX and RCA at that time. Admit Jul 2016 with LCX ISR, received DES. Admit early 01/2016 with NSTEMI peak trop 0.54, cath 01/2016 with nonobstructive disease. He is admitted with abdominal pain and acute cholecystitis, cardiology is consulted for preoperative valuation.He has had constant epigastric pain with N/V since Wednesday. No symptoms suggestive of cardiac chest pain.    K 3.9, Cr 0.95, A phos 388, AST 45, ALT 181, Hgb 15.4, WBC 14.7. Negative troponin.  EKG SR, no ischemic changes  No Known Allergies  Medications Scheduled Medications: . [START ON 02/08/2016] amLODipine  5 mg Oral Daily  . atorvastatin  80 mg Oral Daily  . insulin aspart  0-9 Units Subcutaneous TID WC  . [START ON 02/08/2016] metoprolol tartrate  25 mg Oral Daily  . [START ON 02/08/2016] ramipril  10 mg Oral BID  . sodium chloride flush  3 mL Intravenous Q12H     Infusions: . sodium chloride       PRN Medications:  acetaminophen **OR** acetaminophen, HYDROcodone-acetaminophen, ketorolac, nitroGLYCERIN, ondansetron **OR** ondansetron (ZOFRAN) IV, zolpidem   Past Medical History:  Diagnosis Date  . CAD (coronary artery disease)    a. Cath 06/08/01 at [facility] with normal LM and LAD, 95% LCx s/p Circumflex stent 4.0 x 15 mm Penta and 85% and 80% prox RCA s/p 3.5 x 23 mm Penta b. cath 01/20/2015 95% prox LCx ISR treated with DES, 55% prox to mid RCA, 45% distal RCA, 40% midLAD, EF 55%  . Diabetes mellitus without complication ([place])   . Erectile dysfunction   . Hyperlipidemia   . Hypertension     Past Surgical History:  Procedure Laterality Date  . ANGIOPLASTY  2002   2 stents  . CARDIAC CATHETERIZATION N/A 01/20/2015   Procedure: Left Heart  Cath and Coronary Angiography;  Surgeon: Belva Crome, MD;  Location: [place] [facility];  Service: Cardiovascular;  Laterality: N/A;  . CARDIAC CATHETERIZATION N/A 01/27/2016   Procedure: Left Heart Cath and Coronary Angiography;  Surgeon: Sherren Mocha, MD;  Location: [place] [facility];  Service: Cardiovascular;  Laterality: N/A;  . CORONARY ANGIOPLASTY WITH STENT PLACEMENT    . ROTATOR CUFF REPAIR Right 2009  . ROTATOR CUFF REPAIR Left 2014  . TRIGGER FINGER RELEASE  2013   4 fingers    Family History  Problem Relation Age of Onset  . CAD Brother 3    Died age 23  . CAD Sister 25  . Heart disease Father 18    Valve replacement and CAD, CABG  . Dementia Mother   . Colon cancer Neg Hx     Social History Mr. Rusek reports that he quit smoking about 12 months ago. His smoking use included Cigarettes. He has a 44.00 pack-year smoking history. He has never used smokeless tobacco. Mr. Quizhpi reports that he drinks alcohol.  Review of Systems CONSTITUTIONAL: No weight loss, fever, chills, weakness or fatigue.  HEENT: Eyes: No visual loss, blurred vision, double vision or yellow sclerae. No hearing loss, sneezing, congestion, runny nose or sore throat.  SKIN: No rash or itching.  CARDIOVASCULAR: No chest pain, chest pressure or chest discomfort. No palpitations or edema.  RESPIRATORY: No shortness of breath, cough or sputum.  GASTROINTESTINAL: +epigastric pain.  GENITOURINARY: no polyuria, no dysuria  NEUROLOGICAL: No headache, dizziness, syncope, paralysis, ataxia, numbness or tingling in the extremities. No change in bowel or bladder control.  MUSCULOSKELETAL: No muscle, back pain, joint pain or stiffness.  HEMATOLOGIC: No anemia, bleeding or bruising.  LYMPHATICS: No enlarged nodes. No history of splenectomy.  PSYCHIATRIC: No history of depression or anxiety.      Physical Examination Blood pressure (!) 146/84, pulse 78, temperature 98.4 F (36.9 C), temperature source  Oral, resp. rate 20, height 5\' 4"  (1.626 m), weight 162 lb (73.5 kg), SpO2 99 %.  Intake/Output Summary (Last 24 hours) at 02/07/16 1529 Last data filed at 02/07/16 1307  Gross per 24 hour  Intake             1050 ml  Output              600 ml  Net              450 ml    HEENT: sclera clear, throat clear  Cardiovascular: RRR, no m/r/g, no jvd  Respiratory: CTAB  GI: +epigastric tenderness  MSK: no LE edema  Neuro: no focal deficits  Psych: appropriate affect   Lab Results  Basic Metabolic Panel:  Recent Labs Lab 02/07/16 1050  NA 134*  K 3.9  CL 97*  CO2 26  GLUCOSE 162*  BUN 11  CREATININE 0.95  CALCIUM 9.2    Liver Function Tests:  Recent Labs Lab 02/07/16 1050  AST 45*  ALT 181*  ALKPHOS 388*  BILITOT 0.9  PROT 7.9  ALBUMIN 4.2    CBC:  Recent Labs Lab 02/07/16 1050  WBC 14.7*  NEUTROABS 12.1*  HGB 15.4  HCT 45.4  MCV 92.3  PLT 368    Cardiac Enzymes:  Recent Labs Lab 02/07/16 1050  TROPONINI <0.03    BNP: Invalid input(s): POCBNP     Impression/Recommendations  1. CAD - current pain is noncardiac in description,appears to all be related to his cholecystitis - recent cath about a week ago without significant disease - recommend proceeding with surgery as planned for gallbladder. He more than a year out since his last stent, ok to hold brillinta as needed for surgery. Resume as able after surgery, would continue for additional year given his recent NSTEMI earlier this month plus he is part of the ongoing TWILIGHT study.     Call us if needed during admission. We will sign off.   Carlyle Dolly, M.D.

## 2016-02-07 NOTE — ED Notes (Signed)
Answered call bell, patient compliant of sternal dull CP, RN notified EKG done.  Dr Regenia Skeeter now with patient.

## 2016-02-07 NOTE — ED Provider Notes (Signed)
[facility] Provider Note   CSN: [id number] Arrival date & time: 02/07/16  [id number]  First Provider Contact:  First MD Initiated Contact with Patient 02/07/16 [id number]        History   Chief Complaint Chief Complaint  Patient presents with  . Abdominal Pain    HPI Troy Boyer is a 65 y.o. male presenting with abdominal pain. He states his pain feels like a cramping sensation and has been present for about 4 days. When it started, he had nausea, vomiting, and one episode of diarrhea. Since then no bowel movements but is continued to have vomiting until he saw his PCP yesterday and was given IM Toradol and IM Zofran. Was discharged with nausea medicine but states he was not given any pain medicine. Cramping has returned and is severe. It is generalized. There is some back pain as well. No dysuria or hematuria. No fevers. Started about 6 hours after eating a [employer] sandwich, is not sure if this is what caused it or not. It did not taste bad.  HPI  Past Medical History:  Diagnosis Date  . CAD (coronary artery disease)    a. Cath 06/08/01 at [facility] with normal LM and LAD, 95% LCx s/p Circumflex stent 4.0 x 15 mm Penta and 85% and 80% prox RCA s/p 3.5 x 23 mm Penta b. cath 01/20/2015 95% prox LCx ISR treated with DES, 55% prox to mid RCA, 45% distal RCA, 40% midLAD, EF 55%  . Diabetes mellitus without complication ([place])   . Erectile dysfunction   . Hyperlipidemia   . Hypertension     Patient Active Problem List   Diagnosis Date Noted  . Cholecystitis 02/07/2016  . Gastroenteritis, acute 02/06/2016  . Unstable angina ([place]) 01/27/2016  . Precordial chest pain 01/27/2016  . Chest pain 07/30/2015  . NSTEMI (non-ST elevated myocardial infarction) ([place]) 01/19/2015  . Non-insulin dependent type 2 diabetes mellitus ([place]) 10/21/2014  . Tobacco abuse 10/17/2013  . Hypertension   . Hyperlipidemia   . CAD (coronary artery disease)     Past Surgical History:  Procedure  Laterality Date  . ANGIOPLASTY  2002   2 stents  . CARDIAC CATHETERIZATION N/A 01/20/2015   Procedure: Left Heart Cath and Coronary Angiography;  Surgeon: Belva Crome, MD;  Location: [place] [facility];  Service: Cardiovascular;  Laterality: N/A;  . CARDIAC CATHETERIZATION N/A 01/27/2016   Procedure: Left Heart Cath and Coronary Angiography;  Surgeon: Sherren Mocha, MD;  Location: [place] [facility];  Service: Cardiovascular;  Laterality: N/A;  . CORONARY ANGIOPLASTY WITH STENT PLACEMENT    . ROTATOR CUFF REPAIR Right 2009  . ROTATOR CUFF REPAIR Left 2014  . TRIGGER FINGER RELEASE  2013   4 fingers       Home Medications    Prior to Admission medications   Medication Sig Start Date End Date Taking? Authorizing Provider  amLODipine (NORVASC) 5 MG tablet TAKE 1 TABLET BY MOUTH DAILY 11/18/15  Yes Lelon Perla, MD  aspirin EC 81 MG tablet Take 81 mg by mouth daily.   Yes Historical Provider, MD  atorvastatin (LIPITOR) 80 MG tablet Take 1 tablet (80 mg total) by mouth daily. 07/30/15  Yes Lelon Perla, MD  diphenoxylate-atropine (LOMOTIL) 2.5-0.025 MG tablet Take 1 tablet by mouth 4 (four) times daily as needed for diarrhea or loose stools. 02/06/16  Yes Evie Lacks Plotnikov, MD  KRILL OIL PO Take 1 tablet by mouth daily.    Yes Historical  Provider, MD  metoprolol tartrate (LOPRESSOR) 25 MG tablet TAKE 1 TABLET BY MOUTH DAILY 09/02/15  Yes Lelon Perla, MD  Multiple Vitamin (MULTIVITAMIN WITH MINERALS) TABS tablet Take 1 tablet by mouth daily.   Yes Historical Provider, MD  nicotine polacrilex (NICORETTE) 4 MG gum Take 4 mg by mouth as needed for smoking cessation.   Yes Historical Provider, MD  nitroGLYCERIN (NITROSTAT) 0.4 MG SL tablet Place 1 tablet (0.4 mg total) under the tongue every 5 (five) minutes as needed for chest pain. 09/18/14  Yes Lelon Perla, MD  ondansetron (ZOFRAN) 4 MG tablet Take 1 tablet (4 mg total) by mouth every 8 (eight) hours as needed for nausea or  vomiting. 02/06/16  Yes Evie Lacks Plotnikov, MD  ramipril (ALTACE) 10 MG capsule TAKE ONE CAPSULE BY MOUTH TWICE DAILY 04/04/15  Yes Lelon Perla, MD  sildenafil (VIAGRA) 100 MG tablet Take 1 tablet (100 mg total) by mouth daily as needed for erectile dysfunction. 03/19/15  Yes Lelon Perla, MD  ticagrelor (BRILINTA) 90 MG TABS tablet Take 1 tablet (90 mg total) by mouth 2 (two) times daily. 01/28/16  Yes Brittainy Erie Noe, PA-C  ticagrelor (BRILINTA) 90 MG TABS tablet Take 1 tablet (90 mg total) by mouth 2 (two) times daily. 01/28/16  Yes Brittainy Erie Noe, PA-C  zolpidem (AMBIEN) 5 MG tablet TAKE 1 TABLET BY MOUTH AT BEDTIME AS NEEDED FOR SLEEP 11/27/15  Yes Golden Circle, FNP    Family History Family History  Problem Relation Age of Onset  . CAD Brother 59    Died age 99  . CAD Sister 71  . Heart disease Father 38    Valve replacement and CAD, CABG  . Dementia Mother   . Colon cancer Neg Hx     Social History Social History  Substance Use Topics  . Smoking status: Former Smoker    Packs/day: 1.00    Years: 44.00    Types: Cigarettes    Quit date: 01/17/2015  . Smokeless tobacco: Never Used  . Alcohol use Yes     Comment: daily     Allergies   Review of patient's allergies indicates no known allergies.   Review of Systems Review of Systems  Constitutional: Positive for chills. Negative for fever.  Gastrointestinal: Positive for abdominal pain, constipation, diarrhea, nausea and vomiting.  Genitourinary: Negative for dysuria and hematuria.  Musculoskeletal: Positive for back pain.  All other systems reviewed and are negative.    Physical Exam Updated Vital Signs BP 134/63   Pulse 75   Temp 98.4 F (36.9 C) (Oral)   Resp 20   Ht 5\' 4"  (1.626 m)   Wt 162 lb (73.5 kg)   SpO2 97%   BMI 27.81 kg/m   Physical Exam  Constitutional: He is oriented to person, place, and time. He appears well-developed and well-nourished. No distress.  HENT:  Head:  Normocephalic and atraumatic.  Right Ear: External ear normal.  Left Ear: External ear normal.  Nose: Nose normal.  Eyes: Right eye exhibits no discharge. Left eye exhibits no discharge.  Neck: Neck supple.  Cardiovascular: Normal rate, regular rhythm, normal heart sounds and intact distal pulses.   Pulmonary/Chest: Effort normal and breath sounds normal.  Abdominal: Soft. He exhibits no distension. There is tenderness in the right upper quadrant.  Musculoskeletal: He exhibits no edema.  Neurological: He is alert and oriented to person, place, and time.  Skin: Skin is warm and dry. He is not  diaphoretic.  Nursing note and vitals reviewed.    ED Treatments / Results  Labs (all labs ordered are listed, but only abnormal results are displayed) Labs Reviewed  COMPREHENSIVE METABOLIC PANEL - Abnormal; Notable for the following:       Result Value   Sodium 134 (*)    Chloride 97 (*)    Glucose, Bld 162 (*)    AST 45 (*)    ALT 181 (*)    Alkaline Phosphatase 388 (*)    All other components within normal limits  CBC WITH DIFFERENTIAL/PLATELET - Abnormal; Notable for the following:    WBC 14.7 (*)    Neutro Abs 12.1 (*)    Monocytes Absolute 1.2 (*)    All other components within normal limits  LIPASE, BLOOD  TROPONIN I    EKG  EKG Interpretation  Date/Time:  Saturday February 07 2016 11:41:22 EDT Ventricular Rate:  79 PR Interval:    QRS Duration: 99 QT Interval:  421 QTC Calculation: 483 R Axis:   -66 Text Interpretation:  Sinus rhythm Left anterior fascicular block Abnormal R-wave progression, late transition Borderline prolonged QT interval no significant change since Aug 2 Confirmed by Regenia Skeeter MD, Nichola Cieslinski [phone]) on 02/07/2016 12:10:35 PM       Radiology US Abdomen Limited Ruq  Result Date: 02/07/2016 CLINICAL DATA:  Intermittent right upper quadrant and mid abdominal pain for 3 days with nausea, vomiting and diarrhea. EXAM: US ABDOMEN LIMITED - RIGHT UPPER QUADRANT  COMPARISON:  None. FINDINGS: Gallbladder: Mildly distended gallbladder is completely filled with mixed echogenicity material, likely a combination of sludge and gallstones, with the suggestion of a 1.4 cm gallstone in the gallbladder neck. Mild diffuse gallbladder wall thickening. No pericholecystic fluid. Sonographic Percell Miller sign is present. Common bile duct: Diameter: 13 mm. No choledocholithiasis demonstrated in the visualized proximal common bile duct with nonvisualization of the lower common bile duct due to overlying bowel gas. Liver: No focal lesion identified. Within normal limits in parenchymal echogenicity. Incidental simple 2.8 x 2.5 x 2.0 cm renal cyst in the interpolar right kidney. IMPRESSION: 1. Mildly distended gallbladder completely filled with sludge and gallstones with 1.4 cm gallstone in the gallbladder neck. Mild diffuse gallbladder wall thickening with a sonographic Murphy sign present per the technologist. Sonographic findings are consistent with acute cholecystitis in the correct clinical setting. 2. Dilated common bile duct, 13 mm diameter. Lower common bile duct not visualized due to overlying bowel gas. Obstructing choledocholithiasis not excluded. Correlate with serum bilirubin levels. Consider correlation with MRI abdomen/MRCP without and with IV contrast. 3. Normal liver. Electronically Signed   By: Ilona Sorrel M.D.   On: 02/07/2016 11:04    Procedures Procedures (including critical care time)  Medications Ordered in ED Medications  morphine 4 MG/ML injection 4 mg (4 mg Intravenous Given 02/07/16 1118)  ondansetron (ZOFRAN) injection 4 mg (4 mg Intravenous Given 02/07/16 1054)  sodium chloride 0.9 % bolus 1,000 mL (0 mLs Intravenous Stopped 02/07/16 1138)  cefTRIAXone (ROCEPHIN) 2 g in dextrose 5 % 50 mL IVPB (0 g Intravenous Stopped 02/07/16 1255)  morphine 4 MG/ML injection 4 mg (4 mg Intravenous Given 02/07/16 1303)  0.9 %  sodium chloride infusion ( Intravenous  Transfusing/Transfer 02/07/16 1316)     Initial Impression / Assessment and Plan / ED Course  I have reviewed the triage vital signs and the nursing notes.  Pertinent labs & imaging results that were available during my care of the patient were reviewed by me and  considered in my medical decision making (see chart for details).  Clinical Course  Comment By Time  We will check labs, give IV narcotics, IV Zofran, fluids, and check ultrasound given his tenderness is solely in the right upper quadrant. Could be viral illness but with no diarrhea thinking is a little more investigation at this time. Sherwood Gambler, MD 08/12 1005  Appears to have cholecystitis. Pain is better, although now he's having a burning chest pain (2/10). Feels somewhat like prior anginal pain. ECG unremarkable. Will get troponin. I think he needs to be admitted for cholecystitis, will consult [facility]. Likely monitor chest and serial troponins as well. Sherwood Gambler, MD 08/12 1146  D/w Dr. Lucia Gaskins. Will need washout of his Brilinta. Admit to medicine (will c/s [facility]) and antibiotics. Sherwood Gambler, MD 08/12 1202  Dr. Candiss Norse accepts in admission/transfer but wants patient to go to [facility] due to cardiac issues. Sherwood Gambler, MD 08/12 1227     Final Clinical Impressions(s) / ED Diagnoses   Final diagnoses:  RUQ abdominal pain  Acute cholecystitis    New Prescriptions New Prescriptions   No medications on file     Sherwood Gambler, MD 02/07/16 1353

## 2016-02-07 NOTE — ED Triage Notes (Signed)
Pt reports mid-lower abd pain since this past Wednesday (reports n/v at that time -- states it resolved yesterday when he saw PCP and received prescription for Zofran). Denies known fever (endorses chills/sweats), diarrhea. Reports no BM since Wednesday (decreased oral intake). Reports he's able to keep some fluids down.

## 2016-02-07 NOTE — ED Notes (Signed)
MD at bedside. 

## 2016-02-07 NOTE — H&P (Addendum)
History and Physical    Troy Boyer [id number] DOB: Sep 03, 1950 DOA: 02/07/2016   PCP: [name], FNP   Patient coming from:  Home   Chief Complaint: Abdominal Pain   HPI: Troy Boyer is a 65 y.o. male with medical history significant for HTN, HLD, DM CADstatus post stent in 2016, on Brillinta, presenting to the ED with  Postprandial RUQ abdominal pain, cramping, present over the last 4 days. Initially he reported nausea and vomiting with an episode of diarrhea, evaluated by his PCP on 811, receiving IN Toradol and IV and Zofran with initial relief, then returning as generalized cramping, back pain. Nausea and vomiting still present. No dysuria or hematuria. No fevers.Surgical evaluation is being obtained, suspecting that the patient may need a cystectomy. Consult is in progress. Of note, the patient reports burning chest pain as well, 2 out of 10, feeling somewhat like his prior anginal pain. EKG is unremarkable, troponin negative.however, prior to his surgery, Dr. Lucia Gaskins requests a cardiac clearance, and breathing intact to be placed on hold. Cardiology consult is pending  ED Course:  BP (!) 146/84 (BP Location: Left Arm)   Pulse 78   Temp 98.4 F (36.9 C) (Oral)   Resp 20   Ht 5\' 4"  (1.626 m)   Wt 73.5 kg (162 lb)   SpO2 99%   BMI 27.81 kg/m    sodium 134, glucose 162, AST 45, ALT 181, alkaline phosphatase 388, white count 14.7, lipase 30, troponin neg 0.03White count 14.7, hemoglobin 15.4, platelets 368. IV  morphine, Zofran, and process seen IV at the ER.   Abd US revealed acute cholecystitis , Dilated common bile duct, 13 mm diameter. Obstructing choledocholithiasis not excluded. Cardiac and Surgical Consultation are pending    Review of Systems: As per HPI otherwise 10 point review of systems negative.   Past Medical History:  Diagnosis Date  . CAD (coronary artery disease)    a. Cath 06/08/01 at [facility] with normal LM and LAD, 95% LCx s/p Circumflex stent  4.0 x 15 mm Penta and 85% and 80% prox RCA s/p 3.5 x 23 mm Penta b. cath 01/20/2015 95% prox LCx ISR treated with DES, 55% prox to mid RCA, 45% distal RCA, 40% midLAD, EF 55%  . Diabetes mellitus without complication ([place])   . Erectile dysfunction   . Hyperlipidemia   . Hypertension     Past Surgical History:  Procedure Laterality Date  . ANGIOPLASTY  2002   2 stents  . CARDIAC CATHETERIZATION N/A 01/20/2015   Procedure: Left Heart Cath and Coronary Angiography;  Surgeon: Belva Crome, MD;  Location: [place] [facility];  Service: Cardiovascular;  Laterality: N/A;  . CARDIAC CATHETERIZATION N/A 01/27/2016   Procedure: Left Heart Cath and Coronary Angiography;  Surgeon: Sherren Mocha, MD;  Location: [place] [facility];  Service: Cardiovascular;  Laterality: N/A;  . CORONARY ANGIOPLASTY WITH STENT PLACEMENT    . ROTATOR CUFF REPAIR Right 2009  . ROTATOR CUFF REPAIR Left 2014  . TRIGGER FINGER RELEASE  2013   4 fingers    Social History Social History   Social History  . Marital status: Significant Other    Spouse name: N/A  . Number of children: 2  . Years of education: 72   Occupational History  . [employer]   Social History Main Topics  . Smoking status: Former Smoker    Packs/day: 1.00    Years: 44.00    Types: Cigarettes    Quit  date: 01/17/2015  . Smokeless tobacco: Never Used  . Alcohol use Yes     Comment: daily  . Drug use: No  . Sexual activity: Yes   Other Topics Concern  . Not on file   Social History Narrative   Fun: Boat, golf   Denies religious beliefs effecting health care.      No Known Allergies  Family History  Problem Relation Age of Onset  . CAD Brother 70    Died age 64  . CAD Sister 23  . Heart disease Father 29    Valve replacement and CAD, CABG  . Dementia Mother   . Colon cancer Neg Hx       Prior to Admission medications   Medication Sig Start Date End Date Taking? Authorizing Provider  amLODipine (NORVASC) 5  MG tablet TAKE 1 TABLET BY MOUTH DAILY 11/18/15  Yes Lelon Perla, MD  aspirin EC 81 MG tablet Take 81 mg by mouth daily.   Yes Historical Provider, MD  atorvastatin (LIPITOR) 80 MG tablet Take 1 tablet (80 mg total) by mouth daily. 07/30/15  Yes Lelon Perla, MD  diphenoxylate-atropine (LOMOTIL) 2.5-0.025 MG tablet Take 1 tablet by mouth 4 (four) times daily as needed for diarrhea or loose stools. 02/06/16  Yes Evie Lacks Plotnikov, MD  KRILL OIL PO Take 1 tablet by mouth daily.    Yes Historical Provider, MD  metoprolol tartrate (LOPRESSOR) 25 MG tablet TAKE 1 TABLET BY MOUTH DAILY 09/02/15  Yes Lelon Perla, MD  Multiple Vitamin (MULTIVITAMIN WITH MINERALS) TABS tablet Take 1 tablet by mouth daily.   Yes Historical Provider, MD  nicotine polacrilex (NICORETTE) 4 MG gum Take 4 mg by mouth as needed for smoking cessation.   Yes Historical Provider, MD  nitroGLYCERIN (NITROSTAT) 0.4 MG SL tablet Place 1 tablet (0.4 mg total) under the tongue every 5 (five) minutes as needed for chest pain. 09/18/14  Yes Lelon Perla, MD  ondansetron (ZOFRAN) 4 MG tablet Take 1 tablet (4 mg total) by mouth every 8 (eight) hours as needed for nausea or vomiting. 02/06/16  Yes Evie Lacks Plotnikov, MD  ramipril (ALTACE) 10 MG capsule TAKE ONE CAPSULE BY MOUTH TWICE DAILY 04/04/15  Yes Lelon Perla, MD  sildenafil (VIAGRA) 100 MG tablet Take 1 tablet (100 mg total) by mouth daily as needed for erectile dysfunction. 03/19/15  Yes Lelon Perla, MD  ticagrelor (BRILINTA) 90 MG TABS tablet Take 1 tablet (90 mg total) by mouth 2 (two) times daily. 01/28/16  Yes Brittainy Erie Noe, PA-C  ticagrelor (BRILINTA) 90 MG TABS tablet Take 1 tablet (90 mg total) by mouth 2 (two) times daily. 01/28/16  Yes Brittainy Erie Noe, PA-C  zolpidem (AMBIEN) 5 MG tablet TAKE 1 TABLET BY MOUTH AT BEDTIME AS NEEDED FOR SLEEP 11/27/15  Yes Golden Circle, FNP    Physical Exam:    Vitals:   02/07/16 1200 02/07/16 1230 02/07/16 1300  02/07/16 1426  BP: 150/88 154/92 134/63 (!) 146/84  Pulse: 73 71 75 78  Resp: 18 20 20 20   Temp:    98.4 F (36.9 C)  TempSrc:    Oral  SpO2: 98% 98% 97% 99%  Weight:    73.5 kg (162 lb)  Height:    5\' 4"  (1.626 m)       Constitutional: NAD, calm, comfortable  Vitals:   02/07/16 1200 02/07/16 1230 02/07/16 1300 02/07/16 1426  BP: 150/88 154/92 134/63 (!) 146/84  Pulse:  73 71 75 78  Resp: 18 20 20 20   Temp:    98.4 F (36.9 C)  TempSrc:    Oral  SpO2: 98% 98% 97% 99%  Weight:    73.5 kg (162 lb)  Height:    5\' 4"  (1.626 m)   Eyes: PERRL, lids and conjunctivae normal ENMT: Mucous membranes are moist. Posterior pharynx clear of any exudate or lesions.Normal dentition.  Neck: normal, supple, no masses, no thyromegaly Respiratory: clear to auscultation bilaterally, no wheezing, no crackles. Normal respiratory effort. No accessory muscle use.  Cardiovascular: Regular rate and rhythm, no murmurs / rubs / gallops. No extremity edema. 2+ pedal pulses. No carotid bruits.  Abdomen: soft, TTP RUQ with deep palp. , no masses, no hepatosplenomegaly. Bowel sounds positive.  Musculoskeletal: no clubbing / cyanosis. No joint deformity upper and lower extremities. Good ROM, no contractures. Normal muscle tone.  Skin: no rashes, lesions, ulcers.  Neurologic: CN 2-12 grossly intact. Sensation intact, DTR normal. Strength 5/5 in all 4.  Psychiatric: Normal judgment and insight. Alert and oriented x 3. Normal mood.     Labs on Admission: I have personally reviewed following labs and imaging studies  CBC:  Recent Labs Lab 02/07/16 1050  WBC 14.7*  NEUTROABS 12.1*  HGB 15.4  HCT 45.4  MCV 92.3  PLT 123XX123    Basic Metabolic Panel:  Recent Labs Lab 02/07/16 1050  NA 134*  K 3.9  CL 97*  CO2 26  GLUCOSE 162*  BUN 11  CREATININE 0.95  CALCIUM 9.2    GFR: Estimated Creatinine Clearance: 73.1 mL/min (by C-G formula based on SCr of 0.95 mg/dL).  Liver Function  Tests:  Recent Labs Lab 02/07/16 1050  AST 45*  ALT 181*  ALKPHOS 388*  BILITOT 0.9  PROT 7.9  ALBUMIN 4.2    Recent Labs Lab 02/07/16 1050  LIPASE 30   No results for input(s): AMMONIA in the last 168 hours.  Coagulation Profile: No results for input(s): INR, PROTIME in the last 168 hours.  Cardiac Enzymes:  Recent Labs Lab 02/07/16 1050  TROPONINI <0.03    BNP (last 3 results) No results for input(s): PROBNP in the last 8760 hours.  HbA1C: No results for input(s): HGBA1C in the last 72 hours.  CBG: No results for input(s): GLUCAP in the last 168 hours.  Lipid Profile: No results for input(s): CHOL, HDL, LDLCALC, TRIG, CHOLHDL, LDLDIRECT in the last 72 hours.  Thyroid Function Tests: No results for input(s): TSH, T4TOTAL, FREET4, T3FREE, THYROIDAB in the last 72 hours.  Anemia Panel: No results for input(s): VITAMINB12, FOLATE, FERRITIN, TIBC, IRON, RETICCTPCT in the last 72 hours.  Urine analysis: No results found for: COLORURINE, APPEARANCEUR, LABSPEC, PHURINE, GLUCOSEU, HGBUR, BILIRUBINUR, KETONESUR, PROTEINUR, UROBILINOGEN, NITRITE, LEUKOCYTESUR  Sepsis Labs: @LABRCNTIP (procalcitonin:4,lacticidven:4) )No results found for this or any previous visit (from the past 240 hour(s)).   Radiological Exams on Admission: US Abdomen Limited Ruq  Result Date: 02/07/2016 CLINICAL DATA:  Intermittent right upper quadrant and mid abdominal pain for 3 days with nausea, vomiting and diarrhea. EXAM: US ABDOMEN LIMITED - RIGHT UPPER QUADRANT COMPARISON:  None. FINDINGS: Gallbladder: Mildly distended gallbladder is completely filled with mixed echogenicity material, likely a combination of sludge and gallstones, with the suggestion of a 1.4 cm gallstone in the gallbladder neck. Mild diffuse gallbladder wall thickening. No pericholecystic fluid. Sonographic Percell Miller sign is present. Common bile duct: Diameter: 13 mm. No choledocholithiasis demonstrated in the visualized  proximal common bile duct with nonvisualization of  the lower common bile duct due to overlying bowel gas. Liver: No focal lesion identified. Within normal limits in parenchymal echogenicity. Incidental simple 2.8 x 2.5 x 2.0 cm renal cyst in the interpolar right kidney. IMPRESSION: 1. Mildly distended gallbladder completely filled with sludge and gallstones with 1.4 cm gallstone in the gallbladder neck. Mild diffuse gallbladder wall thickening with a sonographic Murphy sign present per the technologist. Sonographic findings are consistent with acute cholecystitis in the correct clinical setting. 2. Dilated common bile duct, 13 mm diameter. Lower common bile duct not visualized due to overlying bowel gas. Obstructing choledocholithiasis not excluded. Correlate with serum bilirubin levels. Consider correlation with MRI abdomen/MRCP without and with IV contrast. 3. Normal liver. Electronically Signed   By: Ilona Sorrel M.D.   On: 02/07/2016 11:04    EKG: Independently reviewed.  Assessment/Plan Active Problems:   Hypertension   Hyperlipidemia   CAD (coronary artery disease)   Non-insulin dependent type 2 diabetes mellitus (HCC)   Chest pain   Acute cholecystitis   RUQ abdominal pain   Acute cholecystitis. AST 45, ALT 181, alkaline phosphatase 388, white count 14.7, lipase 30   IV  morphine, Zofran, and process seen IV at the ER.   Abd US revealed acute cholecystitis , Dilated common bile duct, 13 mm diameter. Obstructing choledocholithiasis not excluded. Cardiac and Surgical Consutation are pending  -admit to Telemetry   Surgery has been consulted. Likely to perform cholecystectomy after Brillinta had been discontinued for about 72 hrs . IVF while NPO. -Cardiology called for pre-op clearance, Dr. [name]   Repeat CBC in m  CAD,  EKG without ACS , last cardiac catheterization with stent in 2016,  patient has very mild chest  pain in the setting of cholecystitis.  Cont High dose statin, beta  blockers Brillinta to be placed on hold , as patient to likely undergo surgery  Dr. [name] kindly to consult on patient    Hypertension BP (!) 146/84  Pulse 78   Controlled Continue home anti-hypertensive medications once able to tolerate orals Will consider hydralazine  If BP 160/90  Hyperlipidemia Continue home statins tomorrow when patient can tolerat po   DVT prophylaxis  As per Cards recommendation  Code Status:   Full   Family Communication:  Discussed with patient  Disposition Plan: Expect patient to be discharged to home after condition improves Consults called:    None Admission status: Inpatient telemetry  [facility] E, PA-C [facility]   If 7PM-7AM, please contact night-coverage [email or web address] Password [facility]  02/07/2016, 2:52 PM

## 2016-02-08 ENCOUNTER — Inpatient Hospital Stay (HOSPITAL_COMMUNITY): Payer: No Typology Code available for payment source

## 2016-02-08 LAB — COMPREHENSIVE METABOLIC PANEL
ALT: 113 U/L — AB (ref 17–63)
AST: 33 U/L (ref 15–41)
Albumin: 3 g/dL — ABNORMAL LOW (ref 3.5–5.0)
Alkaline Phosphatase: 320 U/L — ABNORMAL HIGH (ref 38–126)
Anion gap: 11 (ref 5–15)
BILIRUBIN TOTAL: 0.8 mg/dL (ref 0.3–1.2)
BUN: 8 mg/dL (ref 6–20)
CHLORIDE: 100 mmol/L — AB (ref 101–111)
CO2: 25 mmol/L (ref 22–32)
CREATININE: 0.83 mg/dL (ref 0.61–1.24)
Calcium: 8.3 mg/dL — ABNORMAL LOW (ref 8.9–10.3)
GFR calc Af Amer: >60 mL/min (ref >60)
Glucose, Bld: 126 mg/dL — ABNORMAL HIGH (ref 65–99)
Potassium: 3.7 mmol/L (ref 3.5–5.1)
Sodium: 136 mmol/L (ref 135–145)
Total Protein: 5.9 g/dL — ABNORMAL LOW (ref 6.5–8.1)

## 2016-02-08 LAB — CBC
HCT: 39.8 % (ref 39.0–52.0)
Hemoglobin: 12.9 g/dL — ABNORMAL LOW (ref 13.0–17.0)
MCH: 30.6 pg (ref 26.0–34.0)
MCHC: 32.4 g/dL (ref 30.0–36.0)
MCV: 94.5 fL (ref 78.0–100.0)
PLATELETS: 298 10*3/uL (ref 150–400)
RBC: 4.21 MIL/uL — ABNORMAL LOW (ref 4.22–5.81)
RDW: 13 % (ref 11.5–15.5)
WBC: 11.7 10*3/uL — AB (ref 4.0–10.5)

## 2016-02-08 LAB — PROTIME-INR
INR: 1.24
Prothrombin Time: 15.7 seconds — ABNORMAL HIGH (ref 11.4–15.2)

## 2016-02-08 LAB — GLUCOSE, CAPILLARY
GLUCOSE-CAPILLARY: 96 mg/dL (ref 65–99)
GLUCOSE-CAPILLARY: 99 mg/dL (ref 65–99)
Glucose-Capillary: 124 mg/dL — ABNORMAL HIGH (ref 65–99)
Glucose-Capillary: 96 mg/dL (ref 65–99)

## 2016-02-08 LAB — SURGICAL PCR SCREEN
MRSA, PCR: NEGATIVE
STAPHYLOCOCCUS AUREUS: NEGATIVE

## 2016-02-08 MED ORDER — PIPERACILLIN-TAZOBACTAM 3.375 G IVPB
3.3750 g | Freq: Three times a day (TID) | INTRAVENOUS | Status: DC
  Administered 2016-02-08 – 2016-02-14 (×17): 3.375 g via INTRAVENOUS
  Filled 2016-02-08 (×20): qty 50

## 2016-02-08 NOTE — Progress Notes (Signed)
Pharmacy Antibiotic Note  Troy Boyer is a 65 y.o. male admitted on 02/07/2016 with acute gallbladder.  Pharmacy has been consulted for zosyn dosing.  Plan: Zosyn 3.375g IV q8h (4 hour infusion).  Monitor culture data, renal function and clinical course  Height: 5\' 4"  (162.6 cm) Weight: 162 lb (73.5 kg) IBW/kg (Calculated) : 59.2  Temp (24hrs), Avg:99.1 F (37.3 C), Min:98.7 F (37.1 C), Max:99.7 F (37.6 C)   Recent Labs Lab 02/07/16 1050 02/08/16 0238  WBC 14.7* 11.7*  CREATININE 0.95 0.83    Estimated Creatinine Clearance: 83.6 mL/min (by C-G formula based on SCr of 0.83 mg/dL).    No Known Allergies  Antimicrobials this admission: Zosyn 8/13 >>   Andrey Cota. Diona Foley, PharmD, BCPS Clinical Pharmacist 02/08/2016 7:41 PM

## 2016-02-08 NOTE — Progress Notes (Signed)
[facility] PROGRESS NOTE  Troy Boyer [id number] DOB: 1951/03/10 DOA: 02/07/2016 PCP: [name], FNP  Summary Patient with acute cholecystitis seen on ultrasound. Patient now cleared by cardiology. A significant cardiac history. He is to be off [employer] for 72 hours prior to operation. On antibiotics. Surgery in the morning.  Assessment/Plan: Acute cholecystitis. AST 45, ALT 181, alkaline phosphatase 388, white count 14.7, lipase 30   IV  morphine, Zofran, and process seen IV at the ER.   Abd US revealed acute cholecystitis , Dilated common bile duct, 13 mm diameter. Obstructing choledocholithiasis not excluded but unlikely given LFTS. -admitted to Telemetry   Surgery has been consulted. Likely to perform cholecystectomy TOMORROW as no Brillinta since last Thursday, >72hrs. IVF while NPO. -Cardiology called for pre-op clearance, Dr. [name]   Repeat CBC AM  CAD,  EKG without ACS , last cardiac catheterization with stent in 2016,  patient has very mild chest  pain in the setting of cholecystitis.  Cont High dose statin, beta blockers Brillinta to be placed on hold , as patient to likely undergo surgery  Dr. [name] kindly to consult on patient    Hypertension BP (!) 146/84  Pulse 78   Controlled Continue home anti-hypertensive medications once able to tolerate orals Will consider hydralazine  If BP 160/90  Hyperlipidemia Continue home statins tomorrow when patient can tolerat po   DVT prophylaxis  As per Cards recommendation  Code Status:   Full   Family Communication:  Discussed with patient  Disposition Plan: Expect patient to be discharged to home after condition improves Consults called:    None Admission status: Inpatient telemetry   Consultants:  Cardio  Procedures:  n/a  Antibiotics:  Rocephin 8/12->8/13  Zosyn 8/13->P  HPI/Subjective: No events on tele. No events per nursing. Denies CP. No SOB.   Objective: Vitals:   02/08/16  0901 02/08/16 1350  BP: (!) 142/80 123/65  Pulse:  74  Resp:  16  Temp:  98.8 F (37.1 C)    Intake/Output Summary (Last 24 hours) at 02/08/16 1927 Last data filed at 02/08/16 1900  Gross per 24 hour  Intake          1153.33 ml  Output              900 ml  Net           253.33 ml   Filed Weights   02/07/16 0944 02/07/16 1426  Weight: 73.5 kg (162 lb) 73.5 kg (162 lb)    Exam:   General:  No diaphoresis, anxious, no acute distress  Cardiovascular: Regular rate and rhythm no murmurs rubs or gallops  Respiratory: Clear to auscultation bilaterally no more breathing  Abdomen: Nondistended bowel sounds, RUQ TTP  Musculoskeletal: Moving all extremities, no deformity, 5 out of 5 strength   Data Reviewed: Basic Metabolic Panel:  Recent Labs Lab 02/07/16 1050 02/08/16 0238  NA 134* 136  K 3.9 3.7  CL 97* 100*  CO2 26 25  GLUCOSE 162* 126*  BUN 11 8  CREATININE 0.95 0.83  CALCIUM 9.2 8.3*   Liver Function Tests:  Recent Labs Lab 02/07/16 1050 02/08/16 0238  AST 45* 33  ALT 181* 113*  ALKPHOS 388* 320*  BILITOT 0.9 0.8  PROT 7.9 5.9*  ALBUMIN 4.2 3.0*    Recent Labs Lab 02/07/16 1050  LIPASE 30   No results for input(s): AMMONIA in the last 168 hours. CBC:  Recent Labs Lab 02/07/16 1050 02/08/16 0238  WBC  14.7* 11.7*  NEUTROABS 12.1*  --   HGB 15.4 12.9*  HCT 45.4 39.8  MCV 92.3 94.5  PLT 368 298   Cardiac Enzymes:  Recent Labs Lab 02/07/16 1050  TROPONINI <0.03   BNP (last 3 results) No results for input(s): BNP in the last 8760 hours.  ProBNP (last 3 results) No results for input(s): PROBNP in the last 8760 hours.  CBG:  Recent Labs Lab 02/07/16 1816 02/07/16 2138 02/08/16 0759 02/08/16 1257 02/08/16 1731  GLUCAP 145* 131* 124* 96 96    No results found for this or any previous visit (from the past 240 hour(s)).   Studies: X-ray Chest Pa And Lateral  Result Date: 02/08/2016 CLINICAL DATA:  Preop examination.   Patient for a cholecystectomy. EXAM: CHEST  2 VIEW COMPARISON:  01/26/2016 FINDINGS: Cardiac silhouette is normal in size. Normal mediastinal and hilar contours. Prominent lung markings noted mostly in the bases. No evidence of pneumonia or pulmonary edema. No pleural effusion or pneumothorax. Skeletal structures are demineralized but intact. IMPRESSION: No active cardiopulmonary disease. Electronically Signed   By: Lajean Manes M.D.   On: 02/08/2016 07:43   US Abdomen Limited Ruq  Result Date: 02/07/2016 CLINICAL DATA:  Intermittent right upper quadrant and mid abdominal pain for 3 days with nausea, vomiting and diarrhea. EXAM: US ABDOMEN LIMITED - RIGHT UPPER QUADRANT COMPARISON:  None. FINDINGS: Gallbladder: Mildly distended gallbladder is completely filled with mixed echogenicity material, likely a combination of sludge and gallstones, with the suggestion of a 1.4 cm gallstone in the gallbladder neck. Mild diffuse gallbladder wall thickening. No pericholecystic fluid. Sonographic Percell Miller sign is present. Common bile duct: Diameter: 13 mm. No choledocholithiasis demonstrated in the visualized proximal common bile duct with nonvisualization of the lower common bile duct due to overlying bowel gas. Liver: No focal lesion identified. Within normal limits in parenchymal echogenicity. Incidental simple 2.8 x 2.5 x 2.0 cm renal cyst in the interpolar right kidney. IMPRESSION: 1. Mildly distended gallbladder completely filled with sludge and gallstones with 1.4 cm gallstone in the gallbladder neck. Mild diffuse gallbladder wall thickening with a sonographic Murphy sign present per the technologist. Sonographic findings are consistent with acute cholecystitis in the correct clinical setting. 2. Dilated common bile duct, 13 mm diameter. Lower common bile duct not visualized due to overlying bowel gas. Obstructing choledocholithiasis not excluded. Correlate with serum bilirubin levels. Consider correlation with MRI  abdomen/MRCP without and with IV contrast. 3. Normal liver. Electronically Signed   By: Ilona Sorrel M.D.   On: 02/07/2016 11:04    Scheduled Meds: . amLODipine  5 mg Oral Daily  . antiseptic oral rinse  7 mL Mouth Rinse BID  . atorvastatin  80 mg Oral Daily  . cefTRIAXone (ROCEPHIN)  IV  2 g Intravenous Q24H  . insulin aspart  0-9 Units Subcutaneous TID WC  . metoprolol tartrate  25 mg Oral Daily  . ramipril  10 mg Oral BID  . sodium chloride flush  3 mL Intravenous Q12H   Continuous Infusions: . sodium chloride 100 mL/hr at 02/07/16 2305    Active Problems:   Hypertension   Hyperlipidemia   CAD (coronary artery disease)   Non-insulin dependent type 2 diabetes mellitus ([place])   Chest pain   Acute cholecystitis   RUQ abdominal pain   Cholecystitis    Time spent: [place] [facility] Pager [place]. If 7PM-7AM, please contact night-coverage at [email or web address], password [facility] 02/08/2016, 7:27 PM  LOS:  1 day

## 2016-02-08 NOTE — Consult Note (Signed)
Patient was a consult at [facility] long and then was transferred to [facility].  Surgery not notified of transfer at that time. I was called by Dr Aggie Moats tonight.  Has been off brilinta since admission.  Will see in am and discuss likely cholecystectomy

## 2016-02-09 ENCOUNTER — Inpatient Hospital Stay (HOSPITAL_COMMUNITY): Payer: No Typology Code available for payment source | Admitting: Certified Registered Nurse Anesthetist

## 2016-02-09 ENCOUNTER — Inpatient Hospital Stay (HOSPITAL_COMMUNITY): Payer: No Typology Code available for payment source

## 2016-02-09 ENCOUNTER — Encounter (HOSPITAL_COMMUNITY): Payer: Self-pay | Admitting: Anesthesiology

## 2016-02-09 ENCOUNTER — Encounter (HOSPITAL_COMMUNITY)
Admission: EM | Disposition: A | Discharge status: Home / Self Care | Payer: Self-pay | Source: Home / Self Care | Attending: Internal Medicine

## 2016-02-09 HISTORY — PX: CHOLECYSTECTOMY: SHX55

## 2016-02-09 LAB — CBC WITH DIFFERENTIAL/PLATELET
BASOS ABS: 0 10*3/uL (ref 0.0–0.1)
BASOS PCT: 0 %
Eosinophils Absolute: 0.1 10*3/uL (ref 0.0–0.7)
Eosinophils Relative: 1 %
HEMATOCRIT: 38.6 % — AB (ref 39.0–52.0)
Hemoglobin: 12.3 g/dL — ABNORMAL LOW (ref 13.0–17.0)
LYMPHS PCT: 19 %
Lymphs Abs: 1.7 10*3/uL (ref 0.7–4.0)
MCH: 30.6 pg (ref 26.0–34.0)
MCHC: 31.9 g/dL (ref 30.0–36.0)
MCV: 96 fL (ref 78.0–100.0)
Monocytes Absolute: 1.1 10*3/uL — ABNORMAL HIGH (ref 0.1–1.0)
Monocytes Relative: 12 %
NEUTROS ABS: 6.1 10*3/uL (ref 1.7–7.7)
Neutrophils Relative %: 68 %
PLATELETS: 304 10*3/uL (ref 150–400)
RBC: 4.02 MIL/uL — AB (ref 4.22–5.81)
RDW: 13 % (ref 11.5–15.5)
WBC: 9.1 10*3/uL (ref 4.0–10.5)

## 2016-02-09 LAB — BASIC METABOLIC PANEL
ANION GAP: 10 (ref 5–15)
BUN: 7 mg/dL (ref 6–20)
CALCIUM: 8.3 mg/dL — AB (ref 8.9–10.3)
CO2: 24 mmol/L (ref 22–32)
Chloride: 104 mmol/L (ref 101–111)
Creatinine, Ser: 0.89 mg/dL (ref 0.61–1.24)
GLUCOSE: 103 mg/dL — AB (ref 65–99)
POTASSIUM: 3.5 mmol/L (ref 3.5–5.1)
Sodium: 138 mmol/L (ref 135–145)

## 2016-02-09 LAB — GLUCOSE, CAPILLARY
GLUCOSE-CAPILLARY: 95 mg/dL (ref 65–99)
GLUCOSE-CAPILLARY: 126 mg/dL — AB (ref 65–99)
GLUCOSE-CAPILLARY: 202 mg/dL — AB (ref 65–99)
Glucose-Capillary: 112 mg/dL — ABNORMAL HIGH (ref 65–99)
Glucose-Capillary: 105 mg/dL — ABNORMAL HIGH (ref 65–99)
Glucose-Capillary: 125 mg/dL — ABNORMAL HIGH (ref 65–99)

## 2016-02-09 LAB — HEMOGLOBIN A1C
HEMOGLOBIN A1C: 6.5 % — AB (ref 4.8–5.6)
Mean Plasma Glucose: 140 mg/dL

## 2016-02-09 LAB — HEPATIC FUNCTION PANEL
ALBUMIN: 2.9 g/dL — AB (ref 3.5–5.0)
ALK PHOS: 767 U/L — AB (ref 38–126)
ALT: 450 U/L — ABNORMAL HIGH (ref 17–63)
AST: 509 U/L — ABNORMAL HIGH (ref 15–41)
Bilirubin, Direct: 1.5 mg/dL — ABNORMAL HIGH (ref 0.1–0.5)
Indirect Bilirubin: 0.8 mg/dL (ref 0.3–0.9)
TOTAL PROTEIN: 5.9 g/dL — AB (ref 6.5–8.1)
Total Bilirubin: 2.3 mg/dL — ABNORMAL HIGH (ref 0.3–1.2)

## 2016-02-09 LAB — PROTIME-INR
INR: 1.23
Prothrombin Time: 15.6 seconds — ABNORMAL HIGH (ref 11.4–15.2)

## 2016-02-09 SURGERY — LAPAROSCOPIC CHOLECYSTECTOMY WITH INTRAOPERATIVE CHOLANGIOGRAM
Anesthesia: General | Site: Abdomen

## 2016-02-09 MED ORDER — HEMOSTATIC AGENTS (NO CHARGE) OPTIME
TOPICAL | Status: DC | PRN
  Administered 2016-02-09 (×4): 1 via TOPICAL

## 2016-02-09 MED ORDER — SUCCINYLCHOLINE CHLORIDE 200 MG/10ML IV SOSY
PREFILLED_SYRINGE | INTRAVENOUS | Status: DC | PRN
  Administered 2016-02-09: 80 mg via INTRAVENOUS

## 2016-02-09 MED ORDER — SODIUM CHLORIDE 0.9 % IV SOLN
INTRAVENOUS | Status: DC | PRN
  Administered 2016-02-09: 20 mL

## 2016-02-09 MED ORDER — FENTANYL CITRATE (PF) 100 MCG/2ML IJ SOLN
INTRAMUSCULAR | Status: DC | PRN
  Administered 2016-02-09: 100 ug via INTRAVENOUS
  Administered 2016-02-09 (×3): 50 ug via INTRAVENOUS

## 2016-02-09 MED ORDER — LACTATED RINGERS IV SOLN
INTRAVENOUS | Status: DC | PRN
  Administered 2016-02-09 (×2): via INTRAVENOUS

## 2016-02-09 MED ORDER — ONDANSETRON HCL 4 MG/2ML IJ SOLN
INTRAMUSCULAR | Status: DC | PRN
  Administered 2016-02-09: 4 mg via INTRAVENOUS

## 2016-02-09 MED ORDER — METOCLOPRAMIDE HCL 5 MG/ML IJ SOLN
10.0000 mg | Freq: Once | INTRAMUSCULAR | Status: DC | PRN
Start: 2016-02-09 — End: 2016-02-09

## 2016-02-09 MED ORDER — EPHEDRINE SULFATE 50 MG/ML IJ SOLN
INTRAMUSCULAR | Status: DC | PRN
  Administered 2016-02-09: 10 mg via INTRAVENOUS

## 2016-02-09 MED ORDER — ONDANSETRON HCL 4 MG/2ML IJ SOLN
INTRAMUSCULAR | Status: AC
  Filled 2016-02-09: qty 2

## 2016-02-09 MED ORDER — PROPOFOL 10 MG/ML IV BOLUS
INTRAVENOUS | Status: DC | PRN
  Administered 2016-02-09: 170 mg via INTRAVENOUS

## 2016-02-09 MED ORDER — LACTATED RINGERS IV SOLN: INTRAVENOUS | Status: DC

## 2016-02-09 MED ORDER — ROCURONIUM BROMIDE 100 MG/10ML IV SOLN
INTRAVENOUS | Status: DC | PRN
  Administered 2016-02-09: 40 mg via INTRAVENOUS

## 2016-02-09 MED ORDER — MIDAZOLAM HCL 5 MG/5ML IJ SOLN
INTRAMUSCULAR | Status: DC | PRN
  Administered 2016-02-09: 2 mg via INTRAVENOUS

## 2016-02-09 MED ORDER — MEPERIDINE HCL 25 MG/ML IJ SOLN: 6.2500 mg | INTRAMUSCULAR | Status: DC | PRN

## 2016-02-09 MED ORDER — HYDROMORPHONE HCL 1 MG/ML IJ SOLN
0.2500 mg | INTRAMUSCULAR | Status: DC | PRN
  Administered 2016-02-09: 0.5 mg via INTRAVENOUS

## 2016-02-09 MED ORDER — BUPIVACAINE-EPINEPHRINE (PF) 0.25% -1:200000 IJ SOLN
INTRAMUSCULAR | Status: AC
  Filled 2016-02-09: qty 30

## 2016-02-09 MED ORDER — SUGAMMADEX SODIUM 200 MG/2ML IV SOLN
INTRAVENOUS | Status: DC | PRN
  Administered 2016-02-09: 200 mg via INTRAVENOUS

## 2016-02-09 MED ORDER — FENTANYL CITRATE (PF) 250 MCG/5ML IJ SOLN
INTRAMUSCULAR | Status: AC
  Filled 2016-02-09: qty 5

## 2016-02-09 MED ORDER — SODIUM CHLORIDE 0.9 % IR SOLN
Status: DC | PRN
  Administered 2016-02-09 (×2): 1000 mL

## 2016-02-09 MED ORDER — MIDAZOLAM HCL 2 MG/2ML IJ SOLN
INTRAMUSCULAR | Status: AC
  Filled 2016-02-09: qty 2

## 2016-02-09 MED ORDER — 0.9 % SODIUM CHLORIDE (POUR BTL) OPTIME
TOPICAL | Status: DC | PRN
  Administered 2016-02-09: 1000 mL

## 2016-02-09 MED ORDER — IOPAMIDOL (ISOVUE-300) INJECTION 61%
INTRAVENOUS | Status: AC
  Filled 2016-02-09: qty 50

## 2016-02-09 MED ORDER — HYDROMORPHONE HCL 1 MG/ML IJ SOLN
INTRAMUSCULAR | Status: AC
  Administered 2016-02-09: 0.5 mg via INTRAVENOUS
  Filled 2016-02-09: qty 1

## 2016-02-09 MED ORDER — BUPIVACAINE-EPINEPHRINE 0.25% -1:200000 IJ SOLN
INTRAMUSCULAR | Status: DC | PRN
  Administered 2016-02-09: 5 mL

## 2016-02-09 MED ORDER — LIDOCAINE HCL (CARDIAC) 20 MG/ML IV SOLN
INTRAVENOUS | Status: DC | PRN
  Administered 2016-02-09: 100 mg via INTRAVENOUS

## 2016-02-09 SURGICAL SUPPLY — 46 items
APPLIER CLIP ROT 10 11.4 M/L (STAPLE) ×2
BLADE SURG ROTATE 9660 (MISCELLANEOUS) IMPLANT
CANISTER SUCTION 2500CC (MISCELLANEOUS) ×2 IMPLANT
CHLORAPREP W/TINT 26ML (MISCELLANEOUS) ×2 IMPLANT
CLIP APPLIE ROT 10 11.4 M/L (STAPLE) ×1 IMPLANT
COVER MAYO STAND STRL (DRAPES) ×2 IMPLANT
COVER SURGICAL LIGHT HANDLE (MISCELLANEOUS) ×2 IMPLANT
DRAIN CHANNEL 19F RND (DRAIN) ×2 IMPLANT
DRAPE C-ARM 42X72 X-RAY (DRAPES) ×2 IMPLANT
DRAPE WARM FLUID 44X44 (DRAPE) ×2 IMPLANT
ELECT REM PT RETURN 9FT ADLT (ELECTROSURGICAL) ×2
ELECTRODE REM PT RTRN 9FT ADLT (ELECTROSURGICAL) ×1 IMPLANT
EVACUATOR SILICONE 100CC (DRAIN) ×2 IMPLANT
GLOVE BIO SURGEON STRL SZ8 (GLOVE) ×2 IMPLANT
GLOVE BIOGEL PI IND STRL 7.0 (GLOVE) ×1 IMPLANT
GLOVE BIOGEL PI IND STRL 8 (GLOVE) ×2 IMPLANT
GLOVE BIOGEL PI INDICATOR 7.0 (GLOVE) ×1
GLOVE BIOGEL PI INDICATOR 8 (GLOVE) ×2
GLOVE ECLIPSE 6.5 STRL STRAW (GLOVE) ×2 IMPLANT
GLOVE SURG SS PI 8.0 STRL IVOR (GLOVE) ×2 IMPLANT
GOWN STRL REUS W/ TWL LRG LVL3 (GOWN DISPOSABLE) ×1 IMPLANT
GOWN STRL REUS W/ TWL XL LVL3 (GOWN DISPOSABLE) ×3 IMPLANT
GOWN STRL REUS W/TWL LRG LVL3 (GOWN DISPOSABLE) ×1
GOWN STRL REUS W/TWL XL LVL3 (GOWN DISPOSABLE) ×3
HEMOSTAT SNOW SURGICEL 2X4 (HEMOSTASIS) ×8 IMPLANT
KIT BASIN OR (CUSTOM PROCEDURE TRAY) ×2 IMPLANT
KIT ROOM TURNOVER OR (KITS) ×2 IMPLANT
LIQUID BAND (GAUZE/BANDAGES/DRESSINGS) ×4 IMPLANT
NS IRRIG 1000ML POUR BTL (IV SOLUTION) ×2 IMPLANT
PAD ARMBOARD 7.5X6 YLW CONV (MISCELLANEOUS) ×2 IMPLANT
POUCH SPECIMEN RETRIEVAL 10MM (ENDOMECHANICALS) ×2 IMPLANT
SCISSORS LAP 5X35 DISP (ENDOMECHANICALS) ×2 IMPLANT
SET CHOLANGIOGRAPH 5 50 .035 (SET/KITS/TRAYS/PACK) ×2 IMPLANT
SET IRRIG TUBING LAPAROSCOPIC (IRRIGATION / IRRIGATOR) ×2 IMPLANT
SLEEVE ENDOPATH XCEL 5M (ENDOMECHANICALS) ×2 IMPLANT
SPECIMEN JAR SMALL (MISCELLANEOUS) ×2 IMPLANT
SUT ETHILON 2 0 FS 18 (SUTURE) ×2 IMPLANT
SUT MNCRL AB 4-0 PS2 18 (SUTURE) ×2 IMPLANT
SUT VICRYL 0 UR6 27IN ABS (SUTURE) ×4 IMPLANT
TOWEL OR 17X24 6PK STRL BLUE (TOWEL DISPOSABLE) ×2 IMPLANT
TOWEL OR 17X26 10 PK STRL BLUE (TOWEL DISPOSABLE) IMPLANT
TRAY LAPAROSCOPIC MC (CUSTOM PROCEDURE TRAY) ×2 IMPLANT
TROCAR XCEL BLUNT TIP 100MML (ENDOMECHANICALS) ×2 IMPLANT
TROCAR XCEL NON-BLD 11X100MML (ENDOMECHANICALS) ×2 IMPLANT
TROCAR XCEL NON-BLD 5MMX100MML (ENDOMECHANICALS) ×2 IMPLANT
TUBING INSUFFLATION (TUBING) ×2 IMPLANT

## 2016-02-09 NOTE — Brief Op Note (Signed)
02/07/2016 - 02/09/2016  1:35 PM  PATIENT:  Troy Boyer  65 y.o. male  PRE-OPERATIVE DIAGNOSIS:  Gallstones  POST-OPERATIVE DIAGNOSIS:  Gallstones  PROCEDURE:  Procedure(s): LAPAROSCOPIC CHOLECYSTECTOMY WITH INTRAOPERATIVE CHOLANGIOGRAM (N/A)  SURGEON:  Surgeon(s) and Role:    Erroll Luna, MD - Primary      ANESTHESIA:   local and general  EBL:  Total I/O In: 1000 [I.V.:1000] Out: 200 [Blood:200]  BLOOD ADMINISTERED:none  DRAINS: (19) Jackson-Pratt drain(s) with closed bulb suction in the RUQ    LOCAL MEDICATIONS USED:  BUPIVICAINE   SPECIMEN:  Source of Specimen:  GALLBLADDER  DISPOSITION OF SPECIMEN:  PATHOLOGY  COUNTS:  YES  TOURNIQUET:  * No tourniquets in log *  DICTATION: .Other Dictation: Dictation Number [id number]  PLAN OF CARE: Admit to inpatient   PATIENT DISPOSITION:  PACU - hemodynamically stable.   Delay start of Pharmacological VTE agent (>24hrs) due to surgical blood loss or risk of bleeding: yes

## 2016-02-09 NOTE — Anesthesia Postprocedure Evaluation (Signed)
Anesthesia Post Note  Patient: Troy Boyer  Procedure(s) Performed: Procedure(s) (LRB): LAPAROSCOPIC CHOLECYSTECTOMY WITH INTRAOPERATIVE CHOLANGIOGRAM (N/A)  Patient location during evaluation: PACU Anesthesia Type: General Level of consciousness: awake and alert Pain management: pain level controlled Vital Signs Assessment: post-procedure vital signs reviewed and stable Respiratory status: spontaneous breathing, nonlabored ventilation and respiratory function stable Cardiovascular status: blood pressure returned to baseline and stable Postop Assessment: no signs of nausea or vomiting Anesthetic complications: no    Last Vitals:  Vitals:   02/09/16 1427 02/09/16 1505  BP: 127/73 129/68  Pulse: 82 83  Resp: 14 20  Temp:  36.7 C    Last Pain:  Vitals:   02/09/16 1505  TempSrc: Oral  PainSc:                  Nilda Simmer

## 2016-02-09 NOTE — Anesthesia Procedure Notes (Signed)
Procedure Name: Intubation Date/Time: 02/09/2016 11:59 AM Performed by: Carney Living Pre-anesthesia Checklist: Patient identified, Emergency Drugs available, Suction available, Patient being monitored and Timeout performed Patient Re-evaluated:Patient Re-evaluated prior to inductionOxygen Delivery Method: Circle system utilized Preoxygenation: Pre-oxygenation with 100% oxygen Intubation Type: IV induction, Rapid sequence and Cricoid Pressure applied Laryngoscope Size: Mac and 4 Grade View: Grade I Tube type: Oral Tube size: 7.5 mm Number of attempts: 1 Airway Equipment and Method: Stylet Placement Confirmation: ETT inserted through vocal cords under direct vision,  positive ETCO2 and breath sounds checked- equal and bilateral Secured at: 22 cm Tube secured with: Tape Dental Injury: Teeth and Oropharynx as per pre-operative assessment

## 2016-02-09 NOTE — Consult Note (Signed)
[facility] Surgery Consult Note  Troy Boyer 11/10/1950  [id number].    Requesting MD: Dr. Benjaman Lobe Chief Complaint/Reason for Consult: Abdominal pain HPI:  65 y.o male with history of HTN, HLD, DM2, CAD, NSTEMI 2002 w/ stent of LCX and RCA, received LCX stent 2016 on Brillinta, and NSTEMI 01/2016, presented to ED 02/07/16 with RUQ pain. States originally he just had post-prandial nausea and vomiting, so on 8/11 he went to his PCP where he was treated with Zofran and toradol. When the medication wore off he experienced severe RUQ pain along with recurrent nausea/vomiting so he went to ED. Last BM was 8/13 and was soft and brown/geen. Denies HA, CP, SOB, and urinary sxs. RUQ U/S in ED revealed distended GB, distended CBD, and +sonographic murphy sign.  General surgery asked to consult regarding possible cholecystectomy. Today he rates his pain as a 5/10 without pain medications, and states he is pain free with pain meds. Denies epigastric pain this AM, states it is all on his right side. Patient has received cardiac clearance from cardiology and his Pattricia Boss can be held until after surgery. Per the patient, he has not been able to take his brillinta since Wednesday due to severe nausea/vomiting.   Hepatic Function Latest Ref Rng & Units 02/09/2016 02/08/2016 02/07/2016  Total Protein 6.5 - 8.1 g/dL 5.9(L) 5.9(L) 7.9  Albumin 3.5 - 5.0 g/dL 2.9(L) 3.0(L) 4.2  AST 15 - 41 U/L 509(H) 33 45(H)  ALT 17 - 63 U/L 450(H) 113(H) 181(H)  Alk Phosphatase 38 - 126 U/L 767(H) 320(H) 388(H)  Total Bilirubin 0.3 - 1.2 mg/dL 2.3(H) 0.8 0.9  Bilirubin, Direct 0.1 - 0.5 mg/dL 1.5(H) - -   ROS: All systems reviewed and otherwise negative except for as above  Family History  Problem Relation Age of Onset  . CAD Brother 70    Died age 17  . CAD Sister 29  . Heart disease Father 35    Valve replacement and CAD, CABG  . Dementia Mother   . Colon cancer Neg Hx     Past Medical History:  Diagnosis  Date  . CAD (coronary artery disease)    a. Cath 06/08/01 at [facility] with normal LM and LAD, 95% LCx s/p Circumflex stent 4.0 x 15 mm Penta and 85% and 80% prox RCA s/p 3.5 x 23 mm Penta b. cath 01/20/2015 95% prox LCx ISR treated with DES, 55% prox to mid RCA, 45% distal RCA, 40% midLAD, EF 55%  . Diabetes mellitus without complication ([place])   . Erectile dysfunction   . Hyperlipidemia   . Hypertension     Past Surgical History:  Procedure Laterality Date  . ANGIOPLASTY  2002   2 stents  . CARDIAC CATHETERIZATION N/A 01/20/2015   Procedure: Left Heart Cath and Coronary Angiography;  Surgeon: Belva Crome, MD;  Location: [place] [facility];  Service: Cardiovascular;  Laterality: N/A;  . CARDIAC CATHETERIZATION N/A 01/27/2016   Procedure: Left Heart Cath and Coronary Angiography;  Surgeon: Sherren Mocha, MD;  Location: [place] [facility];  Service: Cardiovascular;  Laterality: N/A;  . CORONARY ANGIOPLASTY WITH STENT PLACEMENT    . ROTATOR CUFF REPAIR Right 2009  . ROTATOR CUFF REPAIR Left 2014  . TRIGGER FINGER RELEASE  2013   4 fingers    Social History:  reports that he quit smoking about 12 months ago. His smoking use included Cigarettes. He has a 44.00 pack-year smoking history. He has never used smokeless tobacco. He  reports that he drinks alcohol. He reports that he does not use drugs.  Allergies: No Known Allergies  Medications Prior to Admission  Medication Sig Dispense Refill  . acetaminophen (TYLENOL) 500 MG tablet Take 1,000 mg by mouth every 6 (six) hours as needed for headache (pain).    . amLODipine (NORVASC) 5 MG tablet TAKE 1 TABLET BY MOUTH DAILY 90 tablet 3  . aspirin EC 81 MG tablet Take 81 mg by mouth daily.    . diphenoxylate-atropine (LOMOTIL) 2.5-0.025 MG tablet Take 1 tablet by mouth 4 (four) times daily as needed for diarrhea or loose stools. 60 tablet 0  . Esomeprazole Magnesium (NEXIUM PO) Take 22.3 mg by mouth 2 (two) times daily.    . Krill Oil 500 MG  CAPS Take 500 mg by mouth 2 (two) times daily.    . metoprolol tartrate (LOPRESSOR) 25 MG tablet TAKE 1 TABLET BY MOUTH DAILY (Patient taking differently: TAKE 1 TABLET BY MOUTH DAILY AT BEDTIME) 90 tablet 2  . Multiple Vitamin (MULTIVITAMIN WITH MINERALS) TABS tablet Take 1 tablet by mouth daily.    . nitroGLYCERIN (NITROSTAT) 0.4 MG SL tablet Place 1 tablet (0.4 mg total) under the tongue every 5 (five) minutes as needed for chest pain. 25 tablet 3  . ondansetron (ZOFRAN) 4 MG tablet Take 1 tablet (4 mg total) by mouth every 8 (eight) hours as needed for nausea or vomiting. 20 tablet 0  . sildenafil (VIAGRA) 100 MG tablet Take 1 tablet (100 mg total) by mouth daily as needed for erectile dysfunction. (Patient taking differently: Take 25 mg by mouth daily as needed for erectile dysfunction. ) 10 tablet 5  . ticagrelor (BRILINTA) 90 MG TABS tablet Take 1 tablet (90 mg total) by mouth 2 (two) times daily. 60 tablet 10  . zolpidem (AMBIEN) 5 MG tablet TAKE 1 TABLET BY MOUTH AT BEDTIME AS NEEDED FOR SLEEP 30 tablet 0  . atorvastatin (LIPITOR) 80 MG tablet Take 1 tablet (80 mg total) by mouth daily. (Patient taking differently: Take 80 mg by mouth at bedtime. ) 90 tablet 3  . ramipril (ALTACE) 10 MG capsule TAKE ONE CAPSULE BY MOUTH TWICE DAILY 60 capsule 11  . ticagrelor (BRILINTA) 90 MG TABS tablet Take 1 tablet (90 mg total) by mouth 2 (two) times daily. (Patient not taking: Reported on 02/07/2016) 60 tablet 0    Blood pressure (!) 128/59, pulse 85, temperature 99 F (37.2 C), temperature source Oral, resp. rate 16, height 5' 4" (1.626 m), weight 73.5 kg (162 lb), SpO2 97 %. Physical Exam: General: pleasant, overweight white male who is laying in bed in NAD HEENT: head is normocephalic, atraumatic. Heart: regular, rate, and rhythm.  No obvious murmurs, gallops, or rubs noted.  Palpable pedal pulses bilaterally Lungs: CTAB, no wheezes, rhonchi, or rales noted.  Respiratory effort nonlabored Abd:  soft, TTP RUQ, ND, +BS, no masses, hernias, or organomegaly MS: all 4 extremities are symmetrical with no cyanosis, clubbing, or edema. Skin: warm and dry with no masses, lesions, or rashes Psych:  appropriate affect. Neuro: A&Ox3, normal speech  Results for orders placed or performed during the hospital encounter of 02/07/16 (from the past 48 hour(s))  Comprehensive metabolic panel     Status: Abnormal   Collection Time: 02/07/16 10:50 AM  Result Value Ref Range   Sodium 134 (L) 135 - 145 mmol/L   Potassium 3.9 3.5 - 5.1 mmol/L   Chloride 97 (L) 101 - 111 mmol/L   CO2 26 22 -   32 mmol/L   Glucose, Bld 162 (H) 65 - 99 mg/dL   BUN 11 6 - 20 mg/dL   Creatinine, Ser 0.95 0.61 - 1.24 mg/dL   Calcium 9.2 8.9 - 10.3 mg/dL   Total Protein 7.9 6.5 - 8.1 g/dL   Albumin 4.2 3.5 - 5.0 g/dL   AST 45 (H) 15 - 41 U/L   ALT 181 (H) 17 - 63 U/L   Alkaline Phosphatase 388 (H) 38 - 126 U/L   Total Bilirubin 0.9 0.3 - 1.2 mg/dL   GFR calc non Af Amer >60 >60 mL/min   GFR calc Af Amer >60 >60 mL/min    Comment: (NOTE) The eGFR has been calculated using the CKD EPI equation. This calculation has not been validated in all clinical situations. eGFR's persistently <60 mL/min signify possible Chronic Kidney Disease.    Anion gap 11 5 - 15  Lipase, blood     Status: None   Collection Time: 02/07/16 10:50 AM  Result Value Ref Range   Lipase 30 11 - 51 U/L  CBC with Differential     Status: Abnormal   Collection Time: 02/07/16 10:50 AM  Result Value Ref Range   WBC 14.7 (H) 4.0 - 10.5 K/uL   RBC 4.92 4.22 - 5.81 MIL/uL   Hemoglobin 15.4 13.0 - 17.0 g/dL   HCT 45.4 39.0 - 52.0 %   MCV 92.3 78.0 - 100.0 fL   MCH 31.3 26.0 - 34.0 pg   MCHC 33.9 30.0 - 36.0 g/dL   RDW 13.0 11.5 - 15.5 %   Platelets 368 150 - 400 K/uL   Neutrophils Relative % 83 %   Neutro Abs 12.1 (H) 1.7 - 7.7 K/uL   Lymphocytes Relative 9 %   Lymphs Abs 1.4 0.7 - 4.0 K/uL   Monocytes Relative 8 %   Monocytes Absolute 1.2 (H)  0.1 - 1.0 K/uL   Eosinophils Relative 0 %   Eosinophils Absolute 0.0 0.0 - 0.7 K/uL   Basophils Relative 0 %   Basophils Absolute 0.0 0.0 - 0.1 K/uL  Troponin I     Status: None   Collection Time: 02/07/16 10:50 AM  Result Value Ref Range   Troponin I <0.03 <0.03 ng/mL  Glucose, capillary     Status: Abnormal   Collection Time: 02/07/16  6:16 PM  Result Value Ref Range   Glucose-Capillary 145 (H) 65 - 99 mg/dL  Glucose, capillary     Status: Abnormal   Collection Time: 02/07/16  9:38 PM  Result Value Ref Range   Glucose-Capillary 131 (H) 65 - 99 mg/dL  Comprehensive metabolic panel     Status: Abnormal   Collection Time: 02/08/16  2:38 AM  Result Value Ref Range   Sodium 136 135 - 145 mmol/L   Potassium 3.7 3.5 - 5.1 mmol/L   Chloride 100 (L) 101 - 111 mmol/L   CO2 25 22 - 32 mmol/L   Glucose, Bld 126 (H) 65 - 99 mg/dL   BUN 8 6 - 20 mg/dL   Creatinine, Ser 0.83 0.61 - 1.24 mg/dL   Calcium 8.3 (L) 8.9 - 10.3 mg/dL   Total Protein 5.9 (L) 6.5 - 8.1 g/dL   Albumin 3.0 (L) 3.5 - 5.0 g/dL   AST 33 15 - 41 U/L   ALT 113 (H) 17 - 63 U/L   Alkaline Phosphatase 320 (H) 38 - 126 U/L   Total Bilirubin 0.8 0.3 - 1.2 mg/dL   GFR calc non Af Amer >  60 >60 mL/min   GFR calc Af Amer >60 >60 mL/min    Comment: (NOTE) The eGFR has been calculated using the CKD EPI equation. This calculation has not been validated in all clinical situations. eGFR's persistently <60 mL/min signify possible Chronic Kidney Disease.    Anion gap 11 5 - 15  CBC     Status: Abnormal   Collection Time: 02/08/16  2:38 AM  Result Value Ref Range   WBC 11.7 (H) 4.0 - 10.5 K/uL   RBC 4.21 (L) 4.22 - 5.81 MIL/uL   Hemoglobin 12.9 (L) 13.0 - 17.0 g/dL   HCT 39.8 39.0 - 52.0 %   MCV 94.5 78.0 - 100.0 fL   MCH 30.6 26.0 - 34.0 pg   MCHC 32.4 30.0 - 36.0 g/dL   RDW 13.0 11.5 - 15.5 %   Platelets 298 150 - 400 K/uL  Protime-INR     Status: Abnormal   Collection Time: 02/08/16  2:38 AM  Result Value Ref Range    Prothrombin Time 15.7 (H) 11.4 - 15.2 seconds   INR 1.24   Glucose, capillary     Status: Abnormal   Collection Time: 02/08/16  7:59 AM  Result Value Ref Range   Glucose-Capillary 124 (H) 65 - 99 mg/dL   Comment 1 Notify RN   Glucose, capillary     Status: None   Collection Time: 02/08/16 12:57 PM  Result Value Ref Range   Glucose-Capillary 96 65 - 99 mg/dL   Comment 1 Notify RN   Glucose, capillary     Status: None   Collection Time: 02/08/16  5:31 PM  Result Value Ref Range   Glucose-Capillary 96 65 - 99 mg/dL  Surgical pcr screen     Status: None   Collection Time: 02/08/16  8:12 PM  Result Value Ref Range   MRSA, PCR NEGATIVE NEGATIVE   Staphylococcus aureus NEGATIVE NEGATIVE    Comment:        The Xpert SA Assay (FDA approved for NASAL specimens in patients over 21 years of age), is one component of a comprehensive surveillance program.  Test performance has been validated by [facility] for patients greater than or equal to 64 year old. It is not intended to diagnose infection nor to guide or monitor treatment.   Glucose, capillary     Status: None   Collection Time: 02/08/16 10:37 PM  Result Value Ref Range   Glucose-Capillary 99 65 - 99 mg/dL  CBC with Differential/Platelet     Status: Abnormal   Collection Time: 02/09/16  3:16 AM  Result Value Ref Range   WBC 9.1 4.0 - 10.5 K/uL   RBC 4.02 (L) 4.22 - 5.81 MIL/uL   Hemoglobin 12.3 (L) 13.0 - 17.0 g/dL   HCT 38.6 (L) 39.0 - 52.0 %   MCV 96.0 78.0 - 100.0 fL   MCH 30.6 26.0 - 34.0 pg   MCHC 31.9 30.0 - 36.0 g/dL   RDW 13.0 11.5 - 15.5 %   Platelets 304 150 - 400 K/uL   Neutrophils Relative % 68 %   Neutro Abs 6.1 1.7 - 7.7 K/uL   Lymphocytes Relative 19 %   Lymphs Abs 1.7 0.7 - 4.0 K/uL   Monocytes Relative 12 %   Monocytes Absolute 1.1 (H) 0.1 - 1.0 K/uL   Eosinophils Relative 1 %   Eosinophils Absolute 0.1 0.0 - 0.7 K/uL   Basophils Relative 0 %   Basophils Absolute 0.0 0.0 - 0.1 K/uL    Basic  metabolic panel     Status: Abnormal   Collection Time: 02/09/16  3:16 AM  Result Value Ref Range   Sodium 138 135 - 145 mmol/L   Potassium 3.5 3.5 - 5.1 mmol/L   Chloride 104 101 - 111 mmol/L   CO2 24 22 - 32 mmol/L   Glucose, Bld 103 (H) 65 - 99 mg/dL   BUN 7 6 - 20 mg/dL   Creatinine, Ser 0.89 0.61 - 1.24 mg/dL   Calcium 8.3 (L) 8.9 - 10.3 mg/dL   GFR calc non Af Amer >60 >60 mL/min   GFR calc Af Amer >60 >60 mL/min    Comment: (NOTE) The eGFR has been calculated using the CKD EPI equation. This calculation has not been validated in all clinical situations. eGFR's persistently <60 mL/min signify possible Chronic Kidney Disease.    Anion gap 10 5 - 15  Protime-INR     Status: Abnormal   Collection Time: 02/09/16  3:16 AM  Result Value Ref Range   Prothrombin Time 15.6 (H) 11.4 - 15.2 seconds   INR 1.23   Hepatic function panel     Status: Abnormal   Collection Time: 02/09/16  3:16 AM  Result Value Ref Range   Total Protein 5.9 (L) 6.5 - 8.1 g/dL   Albumin 2.9 (L) 3.5 - 5.0 g/dL   AST 509 (H) 15 - 41 U/L   ALT 450 (H) 17 - 63 U/L   Alkaline Phosphatase 767 (H) 38 - 126 U/L   Total Bilirubin 2.3 (H) 0.3 - 1.2 mg/dL   Bilirubin, Direct 1.5 (H) 0.1 - 0.5 mg/dL   Indirect Bilirubin 0.8 0.3 - 0.9 mg/dL  Glucose, capillary     Status: None   Collection Time: 02/09/16  5:14 AM  Result Value Ref Range   Glucose-Capillary 95 65 - 99 mg/dL  Glucose, capillary     Status: Abnormal   Collection Time: 02/09/16  7:53 AM  Result Value Ref Range   Glucose-Capillary 112 (H) 65 - 99 mg/dL   Comment 1 Notify RN    X-ray Chest Pa And Lateral  Result Date: 02/08/2016 CLINICAL DATA:  Preop examination.  Patient for a cholecystectomy. EXAM: CHEST  2 VIEW COMPARISON:  01/26/2016 FINDINGS: Cardiac silhouette is normal in size. Normal mediastinal and hilar contours. Prominent lung markings noted mostly in the bases. No evidence of pneumonia or pulmonary edema. No pleural effusion or  pneumothorax. Skeletal structures are demineralized but intact. IMPRESSION: No active cardiopulmonary disease. Electronically Signed   By: David  Ormond M.D.   On: 02/08/2016 07:43   Us Abdomen Limited Ruq  Result Date: 02/07/2016 CLINICAL DATA:  Intermittent right upper quadrant and mid abdominal pain for 3 days with nausea, vomiting and diarrhea. EXAM: US ABDOMEN LIMITED - RIGHT UPPER QUADRANT COMPARISON:  None. FINDINGS: Gallbladder: Mildly distended gallbladder is completely filled with mixed echogenicity material, likely a combination of sludge and gallstones, with the suggestion of a 1.4 cm gallstone in the gallbladder neck. Mild diffuse gallbladder wall thickening. No pericholecystic fluid. Sonographic Murphy sign is present. Common bile duct: Diameter: 13 mm. No choledocholithiasis demonstrated in the visualized proximal common bile duct with nonvisualization of the lower common bile duct due to overlying bowel gas. Liver: No focal lesion identified. Within normal limits in parenchymal echogenicity. Incidental simple 2.8 x 2.5 x 2.0 cm renal cyst in the interpolar right kidney. IMPRESSION: 1. Mildly distended gallbladder completely filled with sludge and gallstones with 1.4 cm gallstone in the gallbladder   neck. Mild diffuse gallbladder wall thickening with a sonographic Murphy sign present per the technologist. Sonographic findings are consistent with acute cholecystitis in the correct clinical setting. 2. Dilated common bile duct, 13 mm diameter. Lower common bile duct not visualized due to overlying bowel gas. Obstructing choledocholithiasis not excluded. Correlate with serum bilirubin levels. Consider correlation with MRI abdomen/MRCP without and with IV contrast. 3. Normal liver. Electronically Signed   By: Jason A Poff M.D.   On: 02/07/2016 11:04   Assessment/Plan RUQ pain Acute Cholecystitis  - NPO, IVF, perioperative abx, analgesics  - lap chole today - CBD 13 mm on RUQ u/s - cannot r/o  choledocholithiasis, will perform intraoperative cholangiogram  - repeat CMET in AM  HTN - PRN hydralazine HLD - statins held while NPO CAD - high dose statin, beta blockers; brillinta held (INR 1.23) DM II    FEN: NPO ID: [id number] Proph: SCD's  Plan: laparoscopic cholecystectomy with IOC today by Dr. Thomas Cornett    Hoa Deriso S Mahad Newstrom, PA-C [facility] 02/09/2016, 8:07 AM Pager: [phone] Consults: [phone] Mon-Fri 7:00 am-4:30 pm Sat-Sun 7:00 am-11:30 am 

## 2016-02-09 NOTE — H&P (View-Only) (Signed)
[facility] Surgery Consult Note  Troy Boyer 1951/03/10  [id number].    Requesting MD: Dr. Benjaman Lobe Chief Complaint/Reason for Consult: Abdominal pain HPI:  65 y.o male with history of HTN, HLD, DM2, CAD, NSTEMI 2002 w/ stent of LCX and RCA, received LCX stent 2016 on Brillinta, and NSTEMI 01/2016, presented to ED 02/07/16 with RUQ pain. States originally he just had post-prandial nausea and vomiting, so on 8/11 he went to his PCP where he was treated with Zofran and toradol. When the medication wore off he experienced severe RUQ pain along with recurrent nausea/vomiting so he went to ED. Last BM was 8/13 and was soft and brown/geen. Denies HA, CP, SOB, and urinary sxs. RUQ U/S in ED revealed distended GB, distended CBD, and +sonographic murphy sign.  General surgery asked to consult regarding possible cholecystectomy. Today he rates his pain as a 5/10 without pain medications, and states he is pain free with pain meds. Denies epigastric pain this AM, states it is all on his right side. Patient has received cardiac clearance from cardiology and his Pattricia Boss can be held until after surgery. Per the patient, he has not been able to take his brillinta since Wednesday due to severe nausea/vomiting.   Hepatic Function Latest Ref Rng & Units 02/09/2016 02/08/2016 02/07/2016  Total Protein 6.5 - 8.1 g/dL 5.9(L) 5.9(L) 7.9  Albumin 3.5 - 5.0 g/dL 2.9(L) 3.0(L) 4.2  AST 15 - 41 U/L 509(H) 33 45(H)  ALT 17 - 63 U/L 450(H) 113(H) 181(H)  Alk Phosphatase 38 - 126 U/L 767(H) 320(H) 388(H)  Total Bilirubin 0.3 - 1.2 mg/dL 2.3(H) 0.8 0.9  Bilirubin, Direct 0.1 - 0.5 mg/dL 1.5(H) - -   ROS: All systems reviewed and otherwise negative except for as above  Family History  Problem Relation Age of Onset  . CAD Brother 4    Died age 47  . CAD Sister 31  . Heart disease Father 29    Valve replacement and CAD, CABG  . Dementia Mother   . Colon cancer Neg Hx     Past Medical History:  Diagnosis  Date  . CAD (coronary artery disease)    a. Cath 06/08/01 at [facility] with normal LM and LAD, 95% LCx s/p Circumflex stent 4.0 x 15 mm Penta and 85% and 80% prox RCA s/p 3.5 x 23 mm Penta b. cath 01/20/2015 95% prox LCx ISR treated with DES, 55% prox to mid RCA, 45% distal RCA, 40% midLAD, EF 55%  . Diabetes mellitus without complication ([place])   . Erectile dysfunction   . Hyperlipidemia   . Hypertension     Past Surgical History:  Procedure Laterality Date  . ANGIOPLASTY  2002   2 stents  . CARDIAC CATHETERIZATION N/A 01/20/2015   Procedure: Left Heart Cath and Coronary Angiography;  Surgeon: Belva Crome, MD;  Location: [place] [facility];  Service: Cardiovascular;  Laterality: N/A;  . CARDIAC CATHETERIZATION N/A 01/27/2016   Procedure: Left Heart Cath and Coronary Angiography;  Surgeon: Sherren Mocha, MD;  Location: [place] [facility];  Service: Cardiovascular;  Laterality: N/A;  . CORONARY ANGIOPLASTY WITH STENT PLACEMENT    . ROTATOR CUFF REPAIR Right 2009  . ROTATOR CUFF REPAIR Left 2014  . TRIGGER FINGER RELEASE  2013   4 fingers    Social History:  reports that he quit smoking about 12 months ago. His smoking use included Cigarettes. He has a 44.00 pack-year smoking history. He has never used smokeless tobacco. He  reports that he drinks alcohol. He reports that he does not use drugs.  Allergies: No Known Allergies  Medications Prior to Admission  Medication Sig Dispense Refill  . acetaminophen (TYLENOL) 500 MG tablet Take 1,000 mg by mouth every 6 (six) hours as needed for headache (pain).    Marland Kitchen amLODipine (NORVASC) 5 MG tablet TAKE 1 TABLET BY MOUTH DAILY 90 tablet 3  . aspirin EC 81 MG tablet Take 81 mg by mouth daily.    . diphenoxylate-atropine (LOMOTIL) 2.5-0.025 MG tablet Take 1 tablet by mouth 4 (four) times daily as needed for diarrhea or loose stools. 60 tablet 0  . Esomeprazole Magnesium (NEXIUM PO) Take 22.3 mg by mouth 2 (two) times daily.    Javier Docker Oil 500 MG  CAPS Take 500 mg by mouth 2 (two) times daily.    . metoprolol tartrate (LOPRESSOR) 25 MG tablet TAKE 1 TABLET BY MOUTH DAILY (Patient taking differently: TAKE 1 TABLET BY MOUTH DAILY AT BEDTIME) 90 tablet 2  . Multiple Vitamin (MULTIVITAMIN WITH MINERALS) TABS tablet Take 1 tablet by mouth daily.    . nitroGLYCERIN (NITROSTAT) 0.4 MG SL tablet Place 1 tablet (0.4 mg total) under the tongue every 5 (five) minutes as needed for chest pain. 25 tablet 3  . ondansetron (ZOFRAN) 4 MG tablet Take 1 tablet (4 mg total) by mouth every 8 (eight) hours as needed for nausea or vomiting. 20 tablet 0  . sildenafil (VIAGRA) 100 MG tablet Take 1 tablet (100 mg total) by mouth daily as needed for erectile dysfunction. (Patient taking differently: Take 25 mg by mouth daily as needed for erectile dysfunction. ) 10 tablet 5  . ticagrelor (BRILINTA) 90 MG TABS tablet Take 1 tablet (90 mg total) by mouth 2 (two) times daily. 60 tablet 10  . zolpidem (AMBIEN) 5 MG tablet TAKE 1 TABLET BY MOUTH AT BEDTIME AS NEEDED FOR SLEEP 30 tablet 0  . atorvastatin (LIPITOR) 80 MG tablet Take 1 tablet (80 mg total) by mouth daily. (Patient taking differently: Take 80 mg by mouth at bedtime. ) 90 tablet 3  . ramipril (ALTACE) 10 MG capsule TAKE ONE CAPSULE BY MOUTH TWICE DAILY 60 capsule 11  . ticagrelor (BRILINTA) 90 MG TABS tablet Take 1 tablet (90 mg total) by mouth 2 (two) times daily. (Patient not taking: Reported on 02/07/2016) 60 tablet 0    Blood pressure (!) 128/59, pulse 85, temperature 99 F (37.2 C), temperature source Oral, resp. rate 16, height 5' 4" (1.626 m), weight 73.5 kg (162 lb), SpO2 97 %. Physical Exam: General: pleasant, overweight white male who is laying in bed in NAD HEENT: head is normocephalic, atraumatic. Heart: regular, rate, and rhythm.  No obvious murmurs, gallops, or rubs noted.  Palpable pedal pulses bilaterally Lungs: CTAB, no wheezes, rhonchi, or rales noted.  Respiratory effort nonlabored Abd:  soft, TTP RUQ, ND, +BS, no masses, hernias, or organomegaly MS: all 4 extremities are symmetrical with no cyanosis, clubbing, or edema. Skin: warm and dry with no masses, lesions, or rashes Psych:  appropriate affect. Neuro: A&Ox3, normal speech  Results for orders placed or performed during the hospital encounter of 02/07/16 (from the past 48 hour(s))  Comprehensive metabolic panel     Status: Abnormal   Collection Time: 02/07/16 10:50 AM  Result Value Ref Range   Sodium 134 (L) 135 - 145 mmol/L   Potassium 3.9 3.5 - 5.1 mmol/L   Chloride 97 (L) 101 - 111 mmol/L   CO2 26 22 -  32 mmol/L   Glucose, Bld 162 (H) 65 - 99 mg/dL   BUN 11 6 - 20 mg/dL   Creatinine, Ser 0.95 0.61 - 1.24 mg/dL   Calcium 9.2 8.9 - 10.3 mg/dL   Total Protein 7.9 6.5 - 8.1 g/dL   Albumin 4.2 3.5 - 5.0 g/dL   AST 45 (H) 15 - 41 U/L   ALT 181 (H) 17 - 63 U/L   Alkaline Phosphatase 388 (H) 38 - 126 U/L   Total Bilirubin 0.9 0.3 - 1.2 mg/dL   GFR calc non Af Amer >60 >60 mL/min   GFR calc Af Amer >60 >60 mL/min    Comment: (NOTE) The eGFR has been calculated using the CKD EPI equation. This calculation has not been validated in all clinical situations. eGFR's persistently <60 mL/min signify possible Chronic Kidney Disease.    Anion gap 11 5 - 15  Lipase, blood     Status: None   Collection Time: 02/07/16 10:50 AM  Result Value Ref Range   Lipase 30 11 - 51 U/L  CBC with Differential     Status: Abnormal   Collection Time: 02/07/16 10:50 AM  Result Value Ref Range   WBC 14.7 (H) 4.0 - 10.5 K/uL   RBC 4.92 4.22 - 5.81 MIL/uL   Hemoglobin 15.4 13.0 - 17.0 g/dL   HCT 45.4 39.0 - 52.0 %   MCV 92.3 78.0 - 100.0 fL   MCH 31.3 26.0 - 34.0 pg   MCHC 33.9 30.0 - 36.0 g/dL   RDW 13.0 11.5 - 15.5 %   Platelets 368 150 - 400 K/uL   Neutrophils Relative % 83 %   Neutro Abs 12.1 (H) 1.7 - 7.7 K/uL   Lymphocytes Relative 9 %   Lymphs Abs 1.4 0.7 - 4.0 K/uL   Monocytes Relative 8 %   Monocytes Absolute 1.2 (H)  0.1 - 1.0 K/uL   Eosinophils Relative 0 %   Eosinophils Absolute 0.0 0.0 - 0.7 K/uL   Basophils Relative 0 %   Basophils Absolute 0.0 0.0 - 0.1 K/uL  Troponin I     Status: None   Collection Time: 02/07/16 10:50 AM  Result Value Ref Range   Troponin I <0.03 <0.03 ng/mL  Glucose, capillary     Status: Abnormal   Collection Time: 02/07/16  6:16 PM  Result Value Ref Range   Glucose-Capillary 145 (H) 65 - 99 mg/dL  Glucose, capillary     Status: Abnormal   Collection Time: 02/07/16  9:38 PM  Result Value Ref Range   Glucose-Capillary 131 (H) 65 - 99 mg/dL  Comprehensive metabolic panel     Status: Abnormal   Collection Time: 02/08/16  2:38 AM  Result Value Ref Range   Sodium 136 135 - 145 mmol/L   Potassium 3.7 3.5 - 5.1 mmol/L   Chloride 100 (L) 101 - 111 mmol/L   CO2 25 22 - 32 mmol/L   Glucose, Bld 126 (H) 65 - 99 mg/dL   BUN 8 6 - 20 mg/dL   Creatinine, Ser 0.83 0.61 - 1.24 mg/dL   Calcium 8.3 (L) 8.9 - 10.3 mg/dL   Total Protein 5.9 (L) 6.5 - 8.1 g/dL   Albumin 3.0 (L) 3.5 - 5.0 g/dL   AST 33 15 - 41 U/L   ALT 113 (H) 17 - 63 U/L   Alkaline Phosphatase 320 (H) 38 - 126 U/L   Total Bilirubin 0.8 0.3 - 1.2 mg/dL   GFR calc non Af Amer >  60 >60 mL/min   GFR calc Af Amer >60 >60 mL/min    Comment: (NOTE) The eGFR has been calculated using the CKD EPI equation. This calculation has not been validated in all clinical situations. eGFR's persistently <60 mL/min signify possible Chronic Kidney Disease.    Anion gap 11 5 - 15  CBC     Status: Abnormal   Collection Time: 02/08/16  2:38 AM  Result Value Ref Range   WBC 11.7 (H) 4.0 - 10.5 K/uL   RBC 4.21 (L) 4.22 - 5.81 MIL/uL   Hemoglobin 12.9 (L) 13.0 - 17.0 g/dL   HCT 39.8 39.0 - 52.0 %   MCV 94.5 78.0 - 100.0 fL   MCH 30.6 26.0 - 34.0 pg   MCHC 32.4 30.0 - 36.0 g/dL   RDW 13.0 11.5 - 15.5 %   Platelets 298 150 - 400 K/uL  Protime-INR     Status: Abnormal   Collection Time: 02/08/16  2:38 AM  Result Value Ref Range    Prothrombin Time 15.7 (H) 11.4 - 15.2 seconds   INR 1.24   Glucose, capillary     Status: Abnormal   Collection Time: 02/08/16  7:59 AM  Result Value Ref Range   Glucose-Capillary 124 (H) 65 - 99 mg/dL   Comment 1 Notify RN   Glucose, capillary     Status: None   Collection Time: 02/08/16 12:57 PM  Result Value Ref Range   Glucose-Capillary 96 65 - 99 mg/dL   Comment 1 Notify RN   Glucose, capillary     Status: None   Collection Time: 02/08/16  5:31 PM  Result Value Ref Range   Glucose-Capillary 96 65 - 99 mg/dL  Surgical pcr screen     Status: None   Collection Time: 02/08/16  8:12 PM  Result Value Ref Range   MRSA, PCR NEGATIVE NEGATIVE   Staphylococcus aureus NEGATIVE NEGATIVE    Comment:        The Xpert SA Assay (FDA approved for NASAL specimens in patients over 77 years of age), is one component of a comprehensive surveillance program.  Test performance has been validated by [facility] for patients greater than or equal to 44 year old. It is not intended to diagnose infection nor to guide or monitor treatment.   Glucose, capillary     Status: None   Collection Time: 02/08/16 10:37 PM  Result Value Ref Range   Glucose-Capillary 99 65 - 99 mg/dL  CBC with Differential/Platelet     Status: Abnormal   Collection Time: 02/09/16  3:16 AM  Result Value Ref Range   WBC 9.1 4.0 - 10.5 K/uL   RBC 4.02 (L) 4.22 - 5.81 MIL/uL   Hemoglobin 12.3 (L) 13.0 - 17.0 g/dL   HCT 38.6 (L) 39.0 - 52.0 %   MCV 96.0 78.0 - 100.0 fL   MCH 30.6 26.0 - 34.0 pg   MCHC 31.9 30.0 - 36.0 g/dL   RDW 13.0 11.5 - 15.5 %   Platelets 304 150 - 400 K/uL   Neutrophils Relative % 68 %   Neutro Abs 6.1 1.7 - 7.7 K/uL   Lymphocytes Relative 19 %   Lymphs Abs 1.7 0.7 - 4.0 K/uL   Monocytes Relative 12 %   Monocytes Absolute 1.1 (H) 0.1 - 1.0 K/uL   Eosinophils Relative 1 %   Eosinophils Absolute 0.1 0.0 - 0.7 K/uL   Basophils Relative 0 %   Basophils Absolute 0.0 0.0 - 0.1 K/uL  Basic  metabolic panel     Status: Abnormal   Collection Time: 02/09/16  3:16 AM  Result Value Ref Range   Sodium 138 135 - 145 mmol/L   Potassium 3.5 3.5 - 5.1 mmol/L   Chloride 104 101 - 111 mmol/L   CO2 24 22 - 32 mmol/L   Glucose, Bld 103 (H) 65 - 99 mg/dL   BUN 7 6 - 20 mg/dL   Creatinine, Ser 0.89 0.61 - 1.24 mg/dL   Calcium 8.3 (L) 8.9 - 10.3 mg/dL   GFR calc non Af Amer >60 >60 mL/min   GFR calc Af Amer >60 >60 mL/min    Comment: (NOTE) The eGFR has been calculated using the CKD EPI equation. This calculation has not been validated in all clinical situations. eGFR's persistently <60 mL/min signify possible Chronic Kidney Disease.    Anion gap 10 5 - 15  Protime-INR     Status: Abnormal   Collection Time: 02/09/16  3:16 AM  Result Value Ref Range   Prothrombin Time 15.6 (H) 11.4 - 15.2 seconds   INR 1.23   Hepatic function panel     Status: Abnormal   Collection Time: 02/09/16  3:16 AM  Result Value Ref Range   Total Protein 5.9 (L) 6.5 - 8.1 g/dL   Albumin 2.9 (L) 3.5 - 5.0 g/dL   AST 509 (H) 15 - 41 U/L   ALT 450 (H) 17 - 63 U/L   Alkaline Phosphatase 767 (H) 38 - 126 U/L   Total Bilirubin 2.3 (H) 0.3 - 1.2 mg/dL   Bilirubin, Direct 1.5 (H) 0.1 - 0.5 mg/dL   Indirect Bilirubin 0.8 0.3 - 0.9 mg/dL  Glucose, capillary     Status: None   Collection Time: 02/09/16  5:14 AM  Result Value Ref Range   Glucose-Capillary 95 65 - 99 mg/dL  Glucose, capillary     Status: Abnormal   Collection Time: 02/09/16  7:53 AM  Result Value Ref Range   Glucose-Capillary 112 (H) 65 - 99 mg/dL   Comment 1 Notify RN    X-ray Chest Pa And Lateral  Result Date: 02/08/2016 CLINICAL DATA:  Preop examination.  Patient for a cholecystectomy. EXAM: CHEST  2 VIEW COMPARISON:  01/26/2016 FINDINGS: Cardiac silhouette is normal in size. Normal mediastinal and hilar contours. Prominent lung markings noted mostly in the bases. No evidence of pneumonia or pulmonary edema. No pleural effusion or  pneumothorax. Skeletal structures are demineralized but intact. IMPRESSION: No active cardiopulmonary disease. Electronically Signed   By: Lajean Manes M.D.   On: 02/08/2016 07:43   US Abdomen Limited Ruq  Result Date: 02/07/2016 CLINICAL DATA:  Intermittent right upper quadrant and mid abdominal pain for 3 days with nausea, vomiting and diarrhea. EXAM: US ABDOMEN LIMITED - RIGHT UPPER QUADRANT COMPARISON:  None. FINDINGS: Gallbladder: Mildly distended gallbladder is completely filled with mixed echogenicity material, likely a combination of sludge and gallstones, with the suggestion of a 1.4 cm gallstone in the gallbladder neck. Mild diffuse gallbladder wall thickening. No pericholecystic fluid. Sonographic Percell Miller sign is present. Common bile duct: Diameter: 13 mm. No choledocholithiasis demonstrated in the visualized proximal common bile duct with nonvisualization of the lower common bile duct due to overlying bowel gas. Liver: No focal lesion identified. Within normal limits in parenchymal echogenicity. Incidental simple 2.8 x 2.5 x 2.0 cm renal cyst in the interpolar right kidney. IMPRESSION: 1. Mildly distended gallbladder completely filled with sludge and gallstones with 1.4 cm gallstone in the gallbladder  neck. Mild diffuse gallbladder wall thickening with a sonographic Murphy sign present per the technologist. Sonographic findings are consistent with acute cholecystitis in the correct clinical setting. 2. Dilated common bile duct, 13 mm diameter. Lower common bile duct not visualized due to overlying bowel gas. Obstructing choledocholithiasis not excluded. Correlate with serum bilirubin levels. Consider correlation with MRI abdomen/MRCP without and with IV contrast. 3. Normal liver. Electronically Signed   By: Ilona Sorrel M.D.   On: 02/07/2016 11:04   Assessment/Plan RUQ pain Acute Cholecystitis  - NPO, IVF, perioperative abx, analgesics  - lap chole today - CBD 13 mm on RUQ u/s - cannot r/o  choledocholithiasis, will perform intraoperative cholangiogram  - repeat CMET in AM  HTN - PRN hydralazine HLD - statins held while NPO CAD - high dose statin, beta blockers; brillinta held (INR 1.23) DM II    FEN: NPO ID: [id number] Proph: SCD's  Plan: laparoscopic cholecystectomy with IOC today by Dr. Erroll Luna    [name], [facility] Surgery 02/09/2016, 8:07 AM Pager: [phone] Consults: [phone] Mon-Fri 7:00 am-4:30 pm Sat-Sun 7:00 am-11:30 am

## 2016-02-09 NOTE — Consult Note (Signed)
Referring Provider: Mitzie Na, MD Primary Care Physician:  [name], FNP Primary Gastroenterologist:  [facility]  Reason for Consultation: Filling defect on cholangiogram during cholecystectomy.   HPI: Troy Boyer is a 65 y.o. male with a history HTN, HLD, DM2, CAD, NSTEMI with stents on Brilinta. Presented to the ED on 02/07/16 with RUQ pain. Associated post-prandial nausea and vomiting. RUQ U/S in ED revealed distended GB, distended CBD, and +sonographic murphy sign. General surgery preformed laparoscopic cholecystectomy and found a filling defect on cholangiogram. Has LFT, bilirubin, and alkaline phosphatae elevation. Called for consultation for possible choledocholithiasis.  Last dose of brilinta per patient was Wednesday due to nausea and vomiting.   Past Medical History:  Diagnosis Date  . CAD (coronary artery disease)    a. Cath 06/08/01 at [facility] with normal LM and LAD, 95% LCx s/p Circumflex stent 4.0 x 15 mm Penta and 85% and 80% prox RCA s/p 3.5 x 23 mm Penta b. cath 01/20/2015 95% prox LCx ISR treated with DES, 55% prox to mid RCA, 45% distal RCA, 40% midLAD, EF 55%  . Diabetes mellitus without complication ([place])   . Erectile dysfunction   . Hyperlipidemia   . Hypertension     Past Surgical History:  Procedure Laterality Date  . ANGIOPLASTY  2002   2 stents  . CARDIAC CATHETERIZATION N/A 01/20/2015   Procedure: Left Heart Cath and Coronary Angiography;  Surgeon: Belva Crome, MD;  Location: [place] [facility];  Service: Cardiovascular;  Laterality: N/A;  . CARDIAC CATHETERIZATION N/A 01/27/2016   Procedure: Left Heart Cath and Coronary Angiography;  Surgeon: Sherren Mocha, MD;  Location: [place] [facility];  Service: Cardiovascular;  Laterality: N/A;  . COLONOSCOPY W/ POLYPECTOMY  08/2013   Avg risk screening, Dr Thomasenia Sales. 5 mm tubular adenoma ascending.  Mild to moderated descending and sigmoid tics.  Internal hemorrhoids  . CORONARY ANGIOPLASTY WITH  STENT PLACEMENT    . ROTATOR CUFF REPAIR Right 2009  . ROTATOR CUFF REPAIR Left 2014  . TRIGGER FINGER RELEASE  2013   4 fingers    Prior to Admission medications   Medication Sig Start Date End Date Taking? Authorizing Provider  acetaminophen (TYLENOL) 500 MG tablet Take 1,000 mg by mouth every 6 (six) hours as needed for headache (pain).   Yes Historical Provider, MD  amLODipine (NORVASC) 5 MG tablet TAKE 1 TABLET BY MOUTH DAILY 11/18/15  Yes Lelon Perla, MD  aspirin EC 81 MG tablet Take 81 mg by mouth daily.   Yes Historical Provider, MD  diphenoxylate-atropine (LOMOTIL) 2.5-0.025 MG tablet Take 1 tablet by mouth 4 (four) times daily as needed for diarrhea or loose stools. 02/06/16  Yes Evie Lacks Plotnikov, MD  Esomeprazole Magnesium (NEXIUM PO) Take 22.3 mg by mouth 2 (two) times daily.   Yes Historical Provider, MD  Javier Docker Oil 500 MG CAPS Take 500 mg by mouth 2 (two) times daily.   Yes Historical Provider, MD  metoprolol tartrate (LOPRESSOR) 25 MG tablet TAKE 1 TABLET BY MOUTH DAILY Patient taking differently: TAKE 1 TABLET BY MOUTH DAILY AT BEDTIME 09/02/15  Yes Lelon Perla, MD  Multiple Vitamin (MULTIVITAMIN WITH MINERALS) TABS tablet Take 1 tablet by mouth daily.   Yes Historical Provider, MD  nitroGLYCERIN (NITROSTAT) 0.4 MG SL tablet Place 1 tablet (0.4 mg total) under the tongue every 5 (five) minutes as needed for chest pain. 09/18/14  Yes Lelon Perla, MD  ondansetron (ZOFRAN) 4 MG tablet Take  1 tablet (4 mg total) by mouth every 8 (eight) hours as needed for nausea or vomiting. 02/06/16  Yes Cassandria Anger, MD  sildenafil (VIAGRA) 100 MG tablet Take 1 tablet (100 mg total) by mouth daily as needed for erectile dysfunction. Patient taking differently: Take 25 mg by mouth daily as needed for erectile dysfunction.  03/19/15  Yes Lelon Perla, MD  ticagrelor (BRILINTA) 90 MG TABS tablet Take 1 tablet (90 mg total) by mouth 2 (two) times daily. 01/28/16  Yes Brittainy Erie Noe, PA-C  zolpidem (AMBIEN) 5 MG tablet TAKE 1 TABLET BY MOUTH AT BEDTIME AS NEEDED FOR SLEEP 11/27/15  Yes Golden Circle, FNP  atorvastatin (LIPITOR) 80 MG tablet Take 1 tablet (80 mg total) by mouth daily. Patient taking differently: Take 80 mg by mouth at bedtime.  07/30/15   Lelon Perla, MD  ramipril (ALTACE) 10 MG capsule TAKE ONE CAPSULE BY MOUTH TWICE DAILY 04/04/15   Lelon Perla, MD  ticagrelor (BRILINTA) 90 MG TABS tablet Take 1 tablet (90 mg total) by mouth 2 (two) times daily. Patient not taking: Reported on 02/07/2016 01/28/16   Consuelo Pandy, PA-C    Current Facility-Administered Medications  Medication Dose Route Frequency Provider Last Rate Last Dose  . 0.9 %  sodium chloride infusion   Intravenous Continuous Rondel Jumbo, PA-C 100 mL/hr at 02/09/16 0353    . [MAR Hold] acetaminophen (TYLENOL) tablet 650 mg  650 mg Oral Q6H PRN Rondel Jumbo, PA-C       Or  . Doug Sou Hold] acetaminophen (TYLENOL) suppository 650 mg  650 mg Rectal Q6H PRN Rondel Jumbo, PA-C      . [MAR Hold] amLODipine (NORVASC) tablet 5 mg  5 mg Oral Daily Rondel Jumbo, PA-C   5 mg at 02/09/16 0804  . [MAR Hold] antiseptic oral rinse (CPC / CETYLPYRIDINIUM CHLORIDE 0.05%) solution 7 mL  7 mL Mouth Rinse BID Tanna Savoy Stinson, DO   7 mL at 02/08/16 2200  . [MAR Hold] atorvastatin (LIPITOR) tablet 80 mg  80 mg Oral Daily Rondel Jumbo, PA-C   80 mg at 02/08/16 0902  . [MAR Hold] HYDROcodone-acetaminophen (NORCO/VICODIN) 5-325 MG per tablet 1-2 tablet  1-2 tablet Oral Q4H PRN Rondel Jumbo, PA-C   2 tablet at 02/08/16 2113  . HYDROmorphone (DILAUDID) injection 0.25-0.5 mg  0.25-0.5 mg Intravenous Q5 min PRN Josephine Igo, MD   0.5 mg at 02/09/16 1432  . [MAR Hold] insulin aspart (novoLOG) injection 0-9 Units  0-9 Units Subcutaneous TID WC Rondel Jumbo, PA-C   1 Units at 02/08/16 0901  . [MAR Hold] ketorolac (TORADOL) 30 MG/ML injection 30 mg  30 mg Intravenous Q6H PRN Rondel Jumbo, PA-C   30  mg at 02/09/16 8882  . lactated ringers infusion   Intravenous Continuous Josephine Igo, MD      . lactated ringers infusion   Intravenous Continuous Josephine Igo, MD      . meperidine (DEMEROL) injection 6.25-12.5 mg  6.25-12.5 mg Intravenous Q5 min PRN Josephine Igo, MD      . metoCLOPramide [facility]) injection 10 mg  10 mg Intravenous Once PRN Josephine Igo, MD      . Doug Sou Hold] metoprolol tartrate (LOPRESSOR) tablet 25 mg  25 mg Oral Daily Rondel Jumbo, PA-C   25 mg at 02/09/16 0804  . [MAR Hold] nitroGLYCERIN (NITROSTAT) SL tablet 0.4 mg  0.4 mg Sublingual Q5 min PRN Rondel Jumbo,  PA-C      . Doug Sou Hold] ondansetron (ZOFRAN) tablet 4 mg  4 mg Oral Q6H PRN Rondel Jumbo, PA-C       Or  . Doug Sou Hold] ondansetron [facility]) injection 4 mg  4 mg Intravenous Q6H PRN Rondel Jumbo, PA-C   4 mg at 02/07/16 2305  . [MAR Hold] piperacillin-tazobactam (ZOSYN) IVPB 3.375 g  3.375 g Intravenous Q8H Rebecka Apley, RPH   3.375 g at 02/09/16 1206  . [MAR Hold] ramipril (ALTACE) capsule 10 mg  10 mg Oral BID Rondel Jumbo, PA-C   10 mg at 02/09/16 3559  . [MAR Hold] sodium chloride flush (NS) 0.9 % injection 3 mL  3 mL Intravenous Q12H Rondel Jumbo, PA-C      . [MAR Hold] zolpidem (AMBIEN) tablet 5 mg  5 mg Oral QHS PRN Rondel Jumbo, PA-C        Allergies as of 02/07/2016  . (No Known Allergies)    Family History  Problem Relation Age of Onset  . CAD Brother 62    Died age 21  . CAD Sister 82  . Heart disease Father 43    Valve replacement and CAD, CABG  . Dementia Mother   . Colon cancer Neg Hx     Social History   Social History  . Marital status: Significant Other    Spouse name: N/A  . Number of children: 2  . Years of education: 86   Occupational History  . [employer]   Social History Main Topics  . Smoking status: Former Smoker    Packs/day: 1.00    Years: 44.00    Types: Cigarettes    Quit date: 01/17/2015  . Smokeless tobacco: Never Used  . Alcohol  use Yes     Comment: daily  . Drug use: No  . Sexual activity: Yes   Other Topics Concern  . Not on file   Social History Narrative   Fun: Boat, golf   Denies religious beliefs effecting health care.     Review of Systems: Gen: Denies any fever or chills CV: Denies chest pain, angina, or palpitations Resp: Denies dyspnea GI: see hpi GU : Denies urinary burning, blood in urine, urinary frequency MS: Denies joint pain, limitation of movement, and swelling. Derm: Denies rash or itching.  Psych: Denies depression, anxiety, memory loss, suicidal ideation, hallucinations, paranoia, and confusion. Heme: Denies bruising, bleeding, and enlarged lymph nodes. Neuro:  Denies any headaches, dizziness, paresthesias.   Physical Exam: Vital signs in last 24 hours: Temp:  [98.3 F (36.8 C)-99 F (37.2 C)] 98.3 F (36.8 C) (08/14 1356) Pulse Rate:  [69-85] 82 (08/14 1427) Resp:  [14-18] 14 (08/14 1427) BP: (117-132)/(57-89) 127/73 (08/14 1427) SpO2:  [95 %-99 %] 97 % (08/14 1427) Last BM Date: 02/08/16  General:   Alert,  Well-developed, well-nourished, pleasant and cooperative in NAD Head:  Normocephalic and atraumatic. Eyes:  Sclera clear, no icterus.   Conjunctiva pink. Ears:  Normal auditory acuity. Nose:  No deformity, discharge,  or lesions. Mouth:  No deformity or lesions.   Neck:  Supple; no masses or thyromegaly. Lungs:  Clear throughout to auscultation.   No wheezes, crackles, or rhonchi.  Heart:  Regular rate and rhythm; no murmurs, clicks, rubs,  or gallops. Abdomen:  Soft, mildly tender, BS active,nonpalp mass or hsm.   Msk:  Symmetrical without gross deformities. . Pulses:  Normal pulses noted. Extremities:  Without clubbing or edema. Neurologic:  Alert and  oriented x4;  grossly normal neurologically. Skin:  Intact without significant lesions or rashes.. Psych:  Alert and cooperative. Normal mood and affect.  Lab Results:  Recent Labs  02/07/16 1050  02/08/16 0238 02/09/16 0316  WBC 14.7* 11.7* 9.1  HGB 15.4 12.9* 12.3*  HCT 45.4 39.8 38.6*  PLT 368 298 304   BMET  Recent Labs  02/07/16 1050 02/08/16 0238 02/09/16 0316  NA 134* 136 138  K 3.9 3.7 3.5  CL 97* 100* 104  CO2 _0 GLUCOSE 162* 126* 103*  BUN _1 CREATININE 0.95 0.83 0.89  CALCIUM 9.2 8.3* 8.3*   LFT  Recent Labs  02/09/16 0316  PROT 5.9*  ALBUMIN 2.9*  AST 509*  ALT 450*  ALKPHOS 767*  BILITOT 2.3*  BILIDIR 1.5*  IBILI 0.8   PT/INR  Recent Labs  02/08/16 0238 02/09/16 0316  LABPROT 15.7* 15.6*  INR 1.24 1.23    Studies/Results: X-ray Chest Pa And Lateral  Result Date: 02/08/2016 CLINICAL DATA:  Preop examination.  Patient for a cholecystectomy. EXAM: CHEST  2 VIEW COMPARISON:  01/26/2016 FINDINGS: Cardiac silhouette is normal in size. Normal mediastinal and hilar contours. Prominent lung markings noted mostly in the bases. No evidence of pneumonia or pulmonary edema. No pleural effusion or pneumothorax. Skeletal structures are demineralized but intact. IMPRESSION: No active cardiopulmonary disease. Electronically Signed   By: Lajean Manes M.D.   On: 02/08/2016 07:43   Dg Cholangiogram Operative  Result Date: 02/09/2016 CLINICAL DATA:  Acute calculus cholecystitis EXAM: INTRAOPERATIVE CHOLANGIOGRAM TECHNIQUE: Cholangiographic images from the C-arm fluoroscopic device were submitted for interpretation post-operatively. Please see the procedural report for the amount of contrast and the fluoroscopy time utilized. COMPARISON:  Prior abdominal ultrasound 02/07/2016 FINDINGS: Images from an intraoperative cholangiogram obtained at the time of laparoscopic cholecystectomy demonstrate cannulation of the cystic duct remanent and opacification of the biliary tree. There is mild intrahepatic biliary ductal dilatation and moderate dilatation of the common bile duct. Irregular filling defects within the distal common bile duct may represent sludge  or small stones. The ampulla is obstructed. There is extravasation of contrast material during the injection. IMPRESSION: 1. Biliary ductal dilatation with irregular filling defects in the distal common bile duct concerning for sludge and/or small stones. 2. Intra and extrahepatic biliary ductal dilatation. Electronically Signed   By: Jacqulynn Cadet M.D.   On: 02/09/2016 13:28    IMPRESSION:  *Choledocholithiasis: found during surgery. S/p cholecystectomy now. LFT, bilirubin, and Alk Phos elevated. INR and prothromin time are within normal limits. Last dose of Brilinta was Wednesday per patient.  *elevated LFTs: secondary to Cholecystitis and possible Choledocholithiasis   *Chronic use of brilinta: secondary to cardiac stents.  PLAN: *Will discuss with Troy Boyer about ERCP for evaluation/removal of choledocholithiasis. Likely Wednesday before ERCP is preformed as this will allow brillinta to clear patient's system.    Troy Ormond PA-S 02/09/2016, 2:38 PM     ________________________________________________________________________  [facility] MD note:  I personally examined the patient, reviewed the data and agree with the assessment and plan described above.  I spoke with Troy Boyer and reviewed his op note, the IOC images.  There was more than usual oozing during the lap chole yesterday and this morning he has pretty red output from his JP drain. Normally a 5 day washout is long enough but I think the brilinta is likely still affecting his blood as can sometimes be the case.  Will  plan on ERCP, aiming for Thursday to allow a bit more time for washout.  For now, OK to eat, ambulate, should stay on antibiotics.   Owens Loffler, MD [facility] Pager [phone]

## 2016-02-09 NOTE — Progress Notes (Signed)
Arrived from PACU to 6n08, denies nausea/pain at this time, wife at bedside.

## 2016-02-09 NOTE — Progress Notes (Signed)
  PROGRESS NOTE  Troy Boyer [id number] DOB: [birth year] DOA: 02/07/2016 PCP: [name], FNP  Brief Narrative: 65 year old man with diabetes, coronary artery disease, presented with abdominal pain, nausea, vomiting. Admitted for acute cholecystitis. Seen by cardiology, cleared for surgery.  Assessment/Plan: 1. Acute cholecystitis. Stable, status post surgery 8/14. Continue empiric antibiotics. 2. Choledocholithiasis. Further management per GI. ERCP planned. 3. DM , remains stable 4. CAD s/p stent 2016. Appears stable.   Continue current management per surgery. Continue antibiotics.  GI consultation for further evaluation of choledocholithiasis.  Resume Brillinta when ok with surgery.  DVT prophylaxis: SCDs Code Status: full Family Communication: none Disposition Plan: home  Murray Hodgkins, MD  [facility] Direct contact: [phone] --[place]  --[email or web address]; password [email or web address]  7PM-7AM contact night coverage as above 02/09/2016, 2:18 PM  LOS: 2 days   Consultants:  Surgery  Cardiology   Procedures:  LAPAROSCOPIC CHOLECYSTECTOMY WITH INTRAOPERATIVE CHOLANGIOGRAM (N/A)  Antimicrobials:  Zosyn 8/13 >>  HPI/Subjective: Feels ok post surgery, has pain  Objective: Vitals:   02/09/16 0511 02/09/16 1044 02/09/16 1356 02/09/16 1357  BP: (!) 128/59 123/64  127/89  Pulse: 85 69  85  Resp:  18  14  Temp: 99 F (37.2 C)  98.3 F (36.8 C)   TempSrc: Oral     SpO2: 97% 99%  98%  Weight:      Height:        Intake/Output Summary (Last 24 hours) at 02/09/16 1418 Last data filed at 02/09/16 1359  Gross per 24 hour  Intake             3805 ml  Output              200 ml  Net             3605 ml     Filed Weights   02/07/16 0944 02/07/16 1426  Weight: 73.5 kg (162 lb) 73.5 kg (162 lb)    Exam:    Constitutional:  . Appears calm and comfortable Respiratory:  . CTA bilaterally, no w/r/r.  . Respiratory effort normal. No  retractions or accessory muscle use Cardiovascular:  . RRR, no m/r/g . No LE extremity edema   Tele SR Psychiatric:  . judgement and insight appear normal . Mental status o Mood, affect appropriate  I have personally reviewed following labs and imaging studies:  Blood sugars stable  BMP unremarkable  LFTs elevated  Leukocytosis resolved  Scheduled Meds: . [MAR Hold] amLODipine  5 mg Oral Daily  . [MAR Hold] antiseptic oral rinse  7 mL Mouth Rinse BID  . [MAR Hold] atorvastatin  80 mg Oral Daily  . [MAR Hold] insulin aspart  0-9 Units Subcutaneous TID WC  . [MAR Hold] metoprolol tartrate  25 mg Oral Daily  . [MAR Hold] piperacillin-tazobactam (ZOSYN)  IV  3.375 g Intravenous Q8H  . [MAR Hold] ramipril  10 mg Oral BID  . [MAR Hold] sodium chloride flush  3 mL Intravenous Q12H   Continuous Infusions: . sodium chloride 100 mL/hr at 02/09/16 0353  . lactated ringers    . lactated ringers      Active Problems:   Hypertension   Hyperlipidemia   CAD (coronary artery disease)   Non-insulin dependent type 2 diabetes mellitus (HCC)   Chest pain   Acute cholecystitis   RUQ abdominal pain   Cholecystitis   LOS: 2 days   Time spent 20 minutes

## 2016-02-09 NOTE — Anesthesia Preprocedure Evaluation (Addendum)
Anesthesia Evaluation  Patient identified by MRN, date of birth, ID band Patient awake    Reviewed: Allergy & Precautions, NPO status , Patient's Chart, lab work & pertinent test results  History of Anesthesia Complications Negative for: history of anesthetic complications  Airway Mallampati: II  TM Distance: >3 FB Neck ROM: Full    Dental  (+) Dental Advisory Given, Teeth Intact   Pulmonary neg shortness of breath, neg sleep apnea, neg COPD, neg recent URI, former smoker,    Pulmonary exam normal breath sounds clear to auscultation       Cardiovascular hypertension, Pt. on medications and Pt. on home beta blockers (-) angina (Patient denies chest pain since his stent placement in 2016)+ CAD, + Past MI and + Cardiac Stents   Rhythm:Regular Rate:Normal  EKG- NSR LAFB  Elevated troponins 01/27/2016 Cardiac cath 01/27/2016  Prox RCA to Mid RCA lesion, 20 %stenosed.  Mid RCA lesion, 50 %stenosed.  Ost Cx to Mid Cx lesion, 0 %stenosed.  Mid LAD lesion, 30 %stenosed.  Prox LAD lesion, 25 %stenosed.  The left ventricular systolic function is normal.  LV end diastolic pressure is normal.  The left ventricular ejection fraction is 55-65% by visual estimate.   1. Continued patency of the stented segments in the left circumflex and RCA 2. Moderate mid-RCA stenosis without change from the previous study 3. Patent LAD with mild diffuse irregularity 4. Normal LV systolic function with normal LVEDP  No clear culprit for NSTEMI.   Stents x 3 LCx-1 RCA-2, Patent   Neuro/Psych neg Seizures negative neurological ROS  negative psych ROS   GI/Hepatic Neg liver ROS, neg GERD  ,Cholelithiasis   Endo/Other  diabetes, Well Controlled, Type 2Hyperlipidemia  Renal/GU negative Renal ROS   ED    Musculoskeletal negative musculoskeletal ROS (+)   Abdominal (+) - obese,   Peds  Hematology On Ticagrelor- last dose charted as  8/11, patient states it was 8/9   Anesthesia Other Findings   Reproductive/Obstetrics                          Anesthesia Physical Anesthesia Plan  ASA: III  Anesthesia Plan: General   Post-op Pain Management:    Induction: Intravenous, Rapid sequence and Cricoid pressure planned  Airway Management Planned: Oral ETT  Additional Equipment:   Intra-op Plan:   Post-operative Plan: Extubation in OR  Informed Consent: I have reviewed the patients History and Physical, chart, labs and discussed the procedure including the risks, benefits and alternatives for the proposed anesthesia with the patient or authorized representative who has indicated his/her understanding and acceptance.   Dental advisory given  Plan Discussed with: Anesthesiologist, CRNA and Surgeon  Anesthesia Plan Comments:         Anesthesia Quick Evaluation

## 2016-02-09 NOTE — Transfer of Care (Signed)
Immediate Anesthesia Transfer of Care Note  Patient: Troy Boyer  Procedure(s) Performed: Procedure(s): LAPAROSCOPIC CHOLECYSTECTOMY WITH INTRAOPERATIVE CHOLANGIOGRAM (N/A)  Patient Location: PACU  Anesthesia Type:General  Level of Consciousness: awake, alert , oriented and patient cooperative  Airway & Oxygen Therapy: Patient Spontanous Breathing and Patient connected to nasal cannula oxygen  Post-op Assessment: Report given to RN, Post -op Vital signs reviewed and stable and Patient moving all extremities X 4  Post vital signs: Reviewed and stable  Last Vitals:  Vitals:   02/09/16 1044 02/09/16 1356  BP: 123/64   Pulse: 69   Resp: 18   Temp:  36.8 C    Last Pain:  Vitals:   02/09/16 0714  TempSrc:   PainSc: Asleep      Patients Stated Pain Goal: 0 ([id number])  Complications: No apparent anesthesia complications

## 2016-02-09 NOTE — Interval H&P Note (Signed)
History and Physical Interval Note:  02/09/2016 11:17 AM  Troy Boyer  has presented today for surgery, with the diagnosis of Gallstones  The various methods of treatment have been discussed with the patient and family. After consideration of risks, benefits and other options for treatment, the patient has consented to  Procedure(s): LAPAROSCOPIC CHOLECYSTECTOMY WITH INTRAOPERATIVE CHOLANGIOGRAM (N/A) as a surgical intervention .  The patient's history has been reviewed, patient examined, no change in status, stable for surgery.  I have reviewed the patient's chart and labs.  Questions were answered to the patient's satisfaction.     [name] A.

## 2016-02-10 ENCOUNTER — Encounter (HOSPITAL_COMMUNITY): Payer: Self-pay | Admitting: Surgery

## 2016-02-10 DIAGNOSIS — K8050 Calculus of bile duct without cholangitis or cholecystitis without obstruction: Secondary | ICD-10-CM

## 2016-02-10 DIAGNOSIS — K810 Acute cholecystitis: Secondary | ICD-10-CM

## 2016-02-10 LAB — COMPREHENSIVE METABOLIC PANEL WITH GFR
ALT: 372 U/L — ABNORMAL HIGH (ref 17–63)
AST: 237 U/L — ABNORMAL HIGH (ref 15–41)
Albumin: 2.2 g/dL — ABNORMAL LOW (ref 3.5–5.0)
Alkaline Phosphatase: 659 U/L — ABNORMAL HIGH (ref 38–126)
Anion gap: 7 (ref 5–15)
BUN: 12 mg/dL (ref 6–20)
CO2: 25 mmol/L (ref 22–32)
Calcium: 7.7 mg/dL — ABNORMAL LOW (ref 8.9–10.3)
Chloride: 105 mmol/L (ref 101–111)
Creatinine, Ser: 1.01 mg/dL (ref 0.61–1.24)
GFR calc Af Amer: >60 mL/min
GFR calc non Af Amer: >60 mL/min
Glucose, Bld: 168 mg/dL — ABNORMAL HIGH (ref 65–99)
Potassium: 4 mmol/L (ref 3.5–5.1)
Sodium: 137 mmol/L (ref 135–145)
Total Bilirubin: 1.7 mg/dL — ABNORMAL HIGH (ref 0.3–1.2)
Total Protein: 4.7 g/dL — ABNORMAL LOW (ref 6.5–8.1)

## 2016-02-10 LAB — CBC
HCT: 28.5 % — ABNORMAL LOW (ref 39.0–52.0)
Hemoglobin: 9.2 g/dL — ABNORMAL LOW (ref 13.0–17.0)
MCH: 30.7 pg (ref 26.0–34.0)
MCHC: 32.3 g/dL (ref 30.0–36.0)
MCV: 95 fL (ref 78.0–100.0)
Platelets: 373 10*3/uL (ref 150–400)
RBC: 3 MIL/uL — ABNORMAL LOW (ref 4.22–5.81)
RDW: 13.2 % (ref 11.5–15.5)
WBC: 11 10*3/uL — ABNORMAL HIGH (ref 4.0–10.5)

## 2016-02-10 LAB — GLUCOSE, CAPILLARY
Glucose-Capillary: 145 mg/dL — ABNORMAL HIGH (ref 65–99)
Glucose-Capillary: 132 mg/dL — ABNORMAL HIGH (ref 65–99)
Glucose-Capillary: 157 mg/dL — ABNORMAL HIGH (ref 65–99)
Glucose-Capillary: 134 mg/dL — ABNORMAL HIGH (ref 65–99)

## 2016-02-10 NOTE — Care Management Note (Signed)
Case Management Note  Patient Details  Name: Troy Boyer MRN: [id number] Date of Birth: [birth year]  Subjective/Objective:  s/p lap chole               Action/Plan: Discharge Planning: Chart reviewed. No NCM needs identified. Will continue to follow for dc needs.  Expected Discharge Date: 02/11/2016           Expected Discharge Plan:  Home/Self Care  In-House Referral:  NA  Discharge planning Services  CM Consult  Post Acute Care Choice:  NA Choice offered to:  NA  DME Arranged:  N/A DME Agency:  NA  HH Arranged:  NA HH Agency:  NA  Status of Service:  Completed, signed off  If discussed at [place] of Stay Meetings, dates discussed:    Additional Comments:  Erenest Rasher, RN 02/10/2016, 5:30 PM

## 2016-02-10 NOTE — Progress Notes (Addendum)
PROGRESS NOTE  Troy Boyer [id number] DOB: 08-Dec-1950 DOA: 02/07/2016 PCP: [name], FNP  Brief Narrative: 65 year old man with diabetes, coronary artery disease, presented with abdominal pain, nausea, vomiting. Admitted for acute cholecystitis. Seen by cardiology, cleared for surgery.  Assessment/Plan: 1. Acute cholecystitis: Stable, status post surgery 8/14. Continue empiric antibiotics. Further post operative care per CCS 2. Choledocholithiasis: Further management per GI. ERCP planned for 8/17. LFT's trending down some. 3. DM type 2: remains stable and with fairly well controlled CBG's. Will continue SSI 4. CAD s/p stent 2016: Appears stable. Per cardiology continue statins, B-blocker and ACEI. Resume Brillinta when ok with surgery/GI. 5. Essential HTN: will continue current antihypertensive regimen. BP is stable.   DVT prophylaxis: SCDs Code Status: full Family Communication: none Disposition Plan: home  Consultants:  Surgery  Cardiology   GI (Dr. Ardis Hughs)  Procedures:  LAPAROSCOPIC CHOLECYSTECTOMY WITH INTRAOPERATIVE CHOLANGIOGRAM (N/A)  ERCP: planned for 8/17 tentatively   Antimicrobials:  Zosyn 8/13 >>  HPI/Subjective: Feels ok post surgery. Denies Cp and SOB. Reports pain is mild. No BM yet. Passing gas and tolerating soft diet.   Objective: Vitals:   02/09/16 2206 02/10/16 0226 02/10/16 0540 02/10/16 1416  BP: 134/72 (!) 112/56 113/69 (!) 95/58  Pulse:  95 89 (!) 102  Resp:    18  Temp:  99 F (37.2 C) 99 F (37.2 C) 99.1 F (37.3 C)  TempSrc:  Oral Oral Oral  SpO2:  97% 94% 96%  Weight:      Height:        Intake/Output Summary (Last 24 hours) at 02/10/16 1904 Last data filed at 02/10/16 1844  Gross per 24 hour  Intake             1144 ml  Output              620 ml  Net              524 ml     Filed Weights   02/07/16 0944 02/07/16 1426  Weight: 73.5 kg (162 lb) 73.5 kg (162 lb)    Exam: Constitutional:  . Appears calm  and comfortable; reports pain to be mild. No nausea or vomiting. Denies  CP and SOB. Patient is afebrile  Respiratory:  . CTA bilaterally, no w/r/r.  . Respiratory effort normal. No retractions or accessory muscle use Cardiovascular:  . RRR, no m/r/g . No LE extremity edema   Abdomen  Soft, mild tenderness on palpation, no guarding, positive BS  Holes from laparoscopic procedure stable  JP drain with red output  Psychiatric:  . judgement and insight appear normal . Mental status o Mood, affect appropriate  I have personally reviewed following labs and imaging studies:  Blood sugars stable  CMET with normal electrolytes and renal function. LFT's still elevated, but trending down.   Last CBC on 8/14 demonstrated resolution of leukocytosis and stable Hgb  Scheduled Meds: . amLODipine  5 mg Oral Daily  . antiseptic oral rinse  7 mL Mouth Rinse BID  . atorvastatin  80 mg Oral Daily  . insulin aspart  0-9 Units Subcutaneous TID WC  . metoprolol tartrate  25 mg Oral Daily  . piperacillin-tazobactam (ZOSYN)  IV  3.375 g Intravenous Q8H  . ramipril  10 mg Oral BID  . sodium chloride flush  3 mL Intravenous Q12H   Continuous Infusions: . sodium chloride 100 mL/hr at 02/10/16 1128    Active Problems:   Hypertension   Hyperlipidemia  CAD (coronary artery disease)   Non-insulin dependent type 2 diabetes mellitus ([place])   Chest pain   Acute cholecystitis   RUQ abdominal pain   Cholecystitis  Troy Boyer [id number]   LOS: 3 days   Time spent 25 minutes

## 2016-02-10 NOTE — Op Note (Signed)
Troy Boyer, Troy Boyer              ACCOUNT NO.:  [id number]  MEDICAL RECORD NO.:  [id number]  LOCATION:  6N08C                        FACILITY:  [facility]  PHYSICIAN:  Marcello Moores A. Grethel Zenk, M.D.DATE OF BIRTH:  [birth year]  DATE OF PROCEDURE:  02/09/2016 DATE OF DISCHARGE:                              OPERATIVE REPORT   PREOPERATIVE DIAGNOSIS:  Acute cholecystitis with possible common bile duct stone.  POSTOPERATIVE DIAGNOSIS:  Gangrenous cholecystitis with probable common bile duct stone verified by intraoperative cholangiogram.  PROCEDURE:  Laparoscopic cholecystectomy with intraoperative cholangiogram.  SURGEON:  Joyice Faster. Jhair Witherington, M.D.  ANESTHESIA:  General endotracheal anesthesia with 0.25% Sensorcaine local with epinephrine.  EBL:  200 mL.  SPECIMEN:  Gallbladder to pathology.  DRAINS:  A 19 round Blake drain to gallbladder fossa.  INDICATIONS FOR PROCEDURE:  The patient is a 65 year old male, found to have acute cholecystitis.  He was admitted on Saturday from the [facility] and transferred to the [facility].  He has a significant cardiac history and takes anti-platelet medication for previous cardiac stents.  He was seen by Cardiology and cleared.  The patient states his last dose of his anti-platelet medication was Wednesday.  Given his elevated white count and tenderness on examination as well as ultrasound findings, recommended laparoscopic cholecystectomy for acute cholecystitis.  His LFTs were elevated.  I discussed the role of intraoperative cholangiogram and possible postoperative ERCP if he was found to have common duct stones.  He has understood the need for possible open procedure, the increased risk of bleeding while on these anti-platelet medications, death, DVT, exacerbation of underlying cardiac issues, common bile duct injury, colon injury, small bowel injury, and stomach injury.  He understood the above and wished to proceed.  DESCRIPTION OF  PROCEDURE:  The patient was met in the holding area and taken to the operating room after answering his questions.  He was placed supine on the OR table.  After induction of general endotracheal anesthesia, the abdomen was prepped and draped in a sterile fashion. Time-out was done.  A 1 cm infraumbilical incision was made and dissection was carried down to the midline fascia which were opened in the midline.  The abdominal cavity was entered under direct vision. Pursestring suture of 0 Vicryl was placed.  A 12 mm Hasson cannula was placed under direct vision.  Pneumoperitoneum was created to 15 mmHg of CO2 pressure.  Laparoscope was placed.  Upon inspection, he had significant acute cholecystitis.  An 11 mm subxiphoid port was then placed as well as 2 right upper quadrant 5 mm ports.  The gallbladder dome was identified.  It had signs of gangrene.  It was grabbed by its dome and retracted to the patient's right shoulder.  Second grasper was used to grab the infundibulum of the gallbladder.  We began to dissect out the cystic duct.  The critical view was obtained.  Clips were placed on the gallbladder side of the cystic duct.  A small incision was made with scissors.  Cholangiogram catheter was placed through a separate stab incision and held in place by a single clip.  Intraoperative cholangiogram was performed using 1/2 strength Hypaque dye.  The cystic duct was  long and tortuous and emptied into a dilated common bile duct. The common hepatic duct was dilated as well as the right and left hepatic ducts.  The contrast approached the duodenum, there was no free flow into the duodenum, and multiple filling defects were noted.  This was consistent with choledocholithiasis.  At this point, due to the increased pressure, there was extravasation of contrast.  I could not get the contrast moved through into the duodenum.  I elected to remove the catheter and placed 3 clips across the cystic duct  stump and divided the stump.  The cystic artery was divided between clips.  Cautery was used to dissect the posterior wall which was necrotic of the gallbladder out of liver bed.  This was extremely difficult to do secondary to the medication he was on causing significant oozing from the gallbladder bed.  Once we get the gallbladder out of the gallbladder bed, we placed it in an EndoCatch bag.  There was significant oozing from the gallbladder bed that we tried to control with cautery as well as Surgicel snow.  This took some time, and there was significant oozing but no brisk bleeding noted.  After using cautery and Surgicel SNoW, this began to slow and oozing was much better at this point.  We then removed the gallbladder through the umbilical port site passing it off the field with an EndoCatch bag.  I reinspected the gallbladder bed after closing the fascia with 0 Vicryl at the umbilicus.  There appeared to be no ongoing bleeding.  All clot was removed.  Through the most lateral port site, a 19 round Blake drain was placed in the gallbladder bed and secured to the skin with 2-0 nylon.  There was significant skin using as well noted during this case secondary to his medication.  I was then able to close the skin with 4-0 Monocryl which helped oozing.  Once the drain was secured, it was hooked to bulb suction.  At this point, sterile dressings were applied and liquid adhesive.  All final counts were found to be correct.  The patient was awoken, extubated, and taken to recovery in satisfactory condition.     Duana Benedict A. Abbygale Lapid, M.D.     TAC/MEDQ  D:  02/09/2016  T:  02/10/2016  Job:  [id number]

## 2016-02-10 NOTE — Progress Notes (Signed)
[facility] Boyer [facility] Progress Note  1 Day Post-Op  Subjective: POD#1 s/p lap chole. Abdominal soreness, worse with movement. Tolerating soft diet without nausea or vomiting. +flatus. Showed the patient how to use IS. Reports his goal today is to go for a walk and start using IS. denies CP and SOB.  JP drain: 75 cc/24 h  Objective: Vital signs in last 24 hours: Temp:  [98 F (36.7 C)-99.8 F (37.7 C)] 99 F (37.2 C) (08/15 0540) Pulse Rate:  [69-101] 89 (08/15 0540) Resp:  [14-20] 18 (08/14 1820) BP: (112-134)/(56-89) 113/69 (08/15 0540) SpO2:  [94 %-99 %] 94 % (08/15 0540) Last BM Date: 02/08/16  Intake/Output from previous day: 08/14 0701 - 08/15 0700 In: 2539 [P.O.:120; I.V.:2419] Out: 1300 [Urine:1025; Drains:75; Blood:200] Intake/Output this shift: No intake/output data recorded.  PE:  Gen:  Alert, NAD, pleasant Card:  RRR, no M/G/R heard Pulm:  CTA, no W/R/R Abd: Soft, NT/ND, +BS, incisions C/D/I, RUQ drain with minimal sanguinous drainage  Lab Results:   Recent Labs  02/09/16 0316 02/09/16 2339  WBC 9.1 11.0*  HGB 12.3* 9.2*  HCT 38.6* 28.5*  PLT 304 373   Hepatic Function Latest Ref Rng & Units 02/10/2016 02/09/2016 02/08/2016  Total Protein 6.5 - 8.1 g/dL 4.7(L) 5.9(L) 5.9(L)  Albumin 3.5 - 5.0 g/dL 2.2(L) 2.9(L) 3.0(L)  AST 15 - 41 U/L 237(H) 509(H) 33  ALT 17 - 63 U/L 372(H) 450(H) 113(H)  Alk Phosphatase 38 - 126 U/L 659(H) 767(H) 320(H)  Total Bilirubin 0.3 - 1.2 mg/dL 1.7(H) 2.3(H) 0.8  Bilirubin, Direct 0.1 - 0.5 mg/dL - 1.5(H) -   BMET  Recent Labs  02/09/16 0316 02/10/16 0459  NA 138 137  K 3.5 4.0  CL 104 105  CO2 24 25  GLUCOSE 103* 168*  BUN 7 12  CREATININE 0.89 1.01  CALCIUM 8.3* 7.7*   Studies/Results: Dg Cholangiogram Operative  Result Date: 02/09/2016 CLINICAL DATA:  Acute calculus cholecystitis EXAM: INTRAOPERATIVE CHOLANGIOGRAM TECHNIQUE: Cholangiographic images from the C-arm fluoroscopic device were submitted for  interpretation post-operatively. Please see the procedural report for the amount of contrast and the fluoroscopy time utilized. COMPARISON:  Prior abdominal ultrasound 02/07/2016 FINDINGS: Images from an intraoperative cholangiogram obtained at the time of laparoscopic cholecystectomy demonstrate cannulation of the cystic duct remanent and opacification of the biliary tree. There is mild intrahepatic biliary ductal dilatation and moderate dilatation of the common bile duct. Irregular filling defects within the distal common bile duct may represent sludge or small stones. The ampulla is obstructed. There is extravasation of contrast material during the injection. IMPRESSION: 1. Biliary ductal dilatation with irregular filling defects in the distal common bile duct concerning for sludge and/or small stones. 2. Intra and extrahepatic biliary ductal dilatation. Electronically Signed   By: Jacqulynn Cadet M.D.   On: 02/09/2016 13:28    Anti-infectives: Anti-infectives    Start     Dose/Rate Route Frequency Ordered Stop   02/08/16 2000  piperacillin-tazobactam (ZOSYN) IVPB 3.375 g     3.375 g 12.5 mL/hr over 240 Minutes Intravenous Every 8 hours 02/08/16 1941     02/08/16 1200  cefTRIAXone (ROCEPHIN) 2 g in dextrose 5 % 50 mL IVPB  Status:  Discontinued     2 g 100 mL/hr over 30 Minutes Intravenous Every 24 hours 02/07/16 1540 02/08/16 1932   02/07/16 1200  cefTRIAXone (ROCEPHIN) 2 g in dextrose 5 % 50 mL IVPB     2 g 100 mL/hr over 30 Minutes Intravenous  Once 02/07/16 1147 02/07/16 1255     Assessment/Plan RUQ pain Acute Cholecystitis - s/p lap chole with IOC by Dr. Marcello Moores Cornett (02/09/16) - JP drain in RUQ, patient did have increased bleeding during surgery secondary to brillinta   Choledocholithiasis - found during surgery on IOC; ERCP by GI likely Thursday 8/17  HTN HLD CAD - on Brillinta for cardiac stents DM II  FEN: regular diet ID: Zosyn DVT Proph: SCD's  Dispo: ERCP  Thursday   LOS: 3 days    Troy Boyer , [facility] 02/10/2016, 8:48 AM Pager: [phone] Consults: [phone] Mon-Fri 7:00 am-4:30 pm Sat-Sun 7:00 am-11:30 am

## 2016-02-11 DIAGNOSIS — K819 Cholecystitis, unspecified: Secondary | ICD-10-CM

## 2016-02-11 DIAGNOSIS — R1011 Right upper quadrant pain: Secondary | ICD-10-CM

## 2016-02-11 DIAGNOSIS — I10 Essential (primary) hypertension: Secondary | ICD-10-CM

## 2016-02-11 DIAGNOSIS — R7989 Other specified abnormal findings of blood chemistry: Secondary | ICD-10-CM

## 2016-02-11 DIAGNOSIS — E119 Type 2 diabetes mellitus without complications: Secondary | ICD-10-CM

## 2016-02-11 DIAGNOSIS — K8040 Calculus of bile duct with cholecystitis, unspecified, without obstruction: Secondary | ICD-10-CM

## 2016-02-11 LAB — GLUCOSE, CAPILLARY
GLUCOSE-CAPILLARY: 99 mg/dL (ref 65–99)
Glucose-Capillary: 129 mg/dL — ABNORMAL HIGH (ref 65–99)
Glucose-Capillary: 174 mg/dL — ABNORMAL HIGH (ref 65–99)
Glucose-Capillary: 134 mg/dL — ABNORMAL HIGH (ref 65–99)

## 2016-02-11 LAB — CBC
HCT: 22 % — ABNORMAL LOW (ref 39.0–52.0)
HEMATOCRIT: 28.3 % — AB (ref 39.0–52.0)
HEMOGLOBIN: 9.2 g/dL — AB (ref 13.0–17.0)
Hemoglobin: 7 g/dL — ABNORMAL LOW (ref 13.0–17.0)
MCH: 30 pg (ref 26.0–34.0)
MCH: 29 pg (ref 26.0–34.0)
MCHC: 31.8 g/dL (ref 30.0–36.0)
MCHC: 32.5 g/dL (ref 30.0–36.0)
MCV: 94.4 fL (ref 78.0–100.0)
MCV: 89.3 fL (ref 78.0–100.0)
PLATELETS: 335 10*3/uL (ref 150–400)
Platelets: 357 10*3/uL (ref 150–400)
RBC: 2.33 MIL/uL — AB (ref 4.22–5.81)
RBC: 3.17 MIL/uL — ABNORMAL LOW (ref 4.22–5.81)
RDW: 13.6 % (ref 11.5–15.5)
RDW: 16.3 % — AB (ref 11.5–15.5)
WBC: 9.2 10*3/uL (ref 4.0–10.5)
WBC: 10.7 10*3/uL — ABNORMAL HIGH (ref 4.0–10.5)

## 2016-02-11 LAB — ABO/RH: ABO/RH(D): B POS

## 2016-02-11 LAB — PREPARE RBC (CROSSMATCH)

## 2016-02-11 LAB — HEMOGLOBIN AND HEMATOCRIT, BLOOD
HEMATOCRIT: 22 % — AB (ref 39.0–52.0)
HEMOGLOBIN: 7.1 g/dL — AB (ref 13.0–17.0)

## 2016-02-11 MED ORDER — SODIUM CHLORIDE 0.9 % IV SOLN: Freq: Once | INTRAVENOUS | Status: DC

## 2016-02-11 NOTE — Progress Notes (Signed)
[facility] Progress Note  2 Days Post-Op  Subjective: Abdominal pain with movement, none at rest. Tolerating PO. +flatus and BMs. Ambulating. Denies weakness, palpitations, and dizziness.  hgb 7.0 this am Some tachycardia up to 102 Objective: Vital signs in last 24 hours: Temp:  [99.1 F (37.3 C)-99.4 F (37.4 C)] 99.4 F (37.4 C) (08/16 0552) Pulse Rate:  [101-102] 102 (08/16 0552) Resp:  [18-20] 18 (08/16 0552) BP: (95-148)/(58-71) 148/71 (08/16 0552) SpO2:  [95 %-98 %] 95 % (08/16 0552) Last BM Date: 02/10/16  Intake/Output from previous day: 08/15 0701 - 08/16 0700 In: 425 [P.O.:25; I.V.:400] Out: 655 [Urine:600; Drains:55] Intake/Output this shift: No intake/output data recorded.  PE: Gen:  Alert, NAD, pleasant Card:  RRR, no M/G/R heard Pulm:  CTA, no W/R/R Abd: Soft, appropriately tender, ND, +BS,incisions C/D/I, drain with minimal sanguinous drainage Ext:  No erythema, edema, or tenderness  Lab Results:   Recent Labs  02/09/16 2339 02/11/16 0513 02/11/16 0801  WBC 11.0*  --  9.2  HGB 9.2* 7.1* 7.0*  HCT 28.5* 22.0* 22.0*  PLT 373  --  335   BMET  Recent Labs  02/09/16 0316 02/10/16 0459  NA 138 137  K 3.5 4.0  CL 104 105  CO2 24 25  GLUCOSE 103* 168*  BUN 7 12  CREATININE 0.89 1.01  CALCIUM 8.3* 7.7*   PT/INR  Recent Labs  02/09/16 0316  LABPROT 15.6*  INR 1.23   CMP     Component Value Date/Time   NA 137 02/10/2016 0459   K 4.0 02/10/2016 0459   CL 105 02/10/2016 0459   CO2 25 02/10/2016 0459   GLUCOSE 168 (H) 02/10/2016 0459   BUN 12 02/10/2016 0459   CREATININE 1.01 02/10/2016 0459   CREATININE 0.90 09/18/2014 0927   CALCIUM 7.7 (L) 02/10/2016 0459   PROT 4.7 (L) 02/10/2016 0459   ALBUMIN 2.2 (L) 02/10/2016 0459   AST 237 (H) 02/10/2016 0459   ALT 372 (H) 02/10/2016 0459   ALKPHOS 659 (H) 02/10/2016 0459   BILITOT 1.7 (H) 02/10/2016 0459   GFRNONAA >60 02/10/2016 0459   GFRNONAA >89 09/18/2014 0927   GFRAA >60 02/10/2016 0459   GFRAA >89 09/18/2014 0927   Lipase     Component Value Date/Time   LIPASE 30 02/07/2016 1050   Studies/Results: Dg Cholangiogram Operative  Result Date: 02/09/2016 CLINICAL DATA:  Acute calculus cholecystitis EXAM: INTRAOPERATIVE CHOLANGIOGRAM TECHNIQUE: Cholangiographic images from the C-arm fluoroscopic device were submitted for interpretation post-operatively. Please see the procedural report for the amount of contrast and the fluoroscopy time utilized. COMPARISON:  Prior abdominal ultrasound 02/07/2016 FINDINGS: Images from an intraoperative cholangiogram obtained at the time of laparoscopic cholecystectomy demonstrate cannulation of the cystic duct remanent and opacification of the biliary tree. There is mild intrahepatic biliary ductal dilatation and moderate dilatation of the common bile duct. Irregular filling defects within the distal common bile duct may represent sludge or small stones. The ampulla is obstructed. There is extravasation of contrast material during the injection. IMPRESSION: 1. Biliary ductal dilatation with irregular filling defects in the distal common bile duct concerning for sludge and/or small stones. 2. Intra and extrahepatic biliary ductal dilatation. Electronically Signed   By: Jacqulynn Cadet M.D.   On: 02/09/2016 13:28    Anti-infectives: Anti-infectives    Start     Dose/Rate Route Frequency Ordered Stop   02/08/16 2000  piperacillin-tazobactam (ZOSYN) IVPB 3.375 g     3.375 g 12.5 mL/hr over 240  Minutes Intravenous Every 8 hours 02/08/16 1941     02/08/16 1200  cefTRIAXone (ROCEPHIN) 2 g in dextrose 5 % 50 mL IVPB  Status:  Discontinued     2 g 100 mL/hr over 30 Minutes Intravenous Every 24 hours 02/07/16 1540 02/08/16 1932   02/07/16 1200  cefTRIAXone (ROCEPHIN) 2 g in dextrose 5 % 50 mL IVPB     2 g 100 mL/hr over 30 Minutes Intravenous  Once 02/07/16 1147 02/07/16 1255     Assessment/Plan RUQ pain Acute Cholecystitis  - s/p lap chole with IOC by Dr. Marcello Moores Cornett (02/09/16) - JP drain in RUQ, patient did have increased bleeding during surgery secondary to brillinta  - follow CBC (hgb 7.0 today): Spoke with Azucena Freed, PA-C with GI and she recommended transfusing prior to ERCP. I have ordered 1 unit pRBC's and ordered a follow-up CBC.   Choledocholithiasis - found during surgery on IOC; ERCP by GI likely Thursday (tomorrow) 8/17  HTN HLD CAD - on Brillinta for cardiac stents DM II  FEN: regular diet, NPO after MN (or per GI if date of endoscopy changes) ID: Zosyn DVT Proph: SCD's  Dispo: transfusing 1 unit pRBC, ERCP scheduled for tomorrow   LOS: 4 days    [name] , [facility] 02/11/2016, 9:29 AM Pager: [phone] Consults: [phone] Mon-Fri 7:00 am-4:30 pm Sat-Sun 7:00 am-11:30 am

## 2016-02-11 NOTE — Progress Notes (Signed)
PROGRESS NOTE  Troy Boyer [id number] DOB: 1950/08/18 DOA: 02/07/2016 PCP: Troy Po, FNP  Brief Narrative: 65 year old man with diabetes, coronary artery disease, presented with abdominal pain, nausea, vomiting. Admitted for acute cholecystitis. Seen by cardiology, cleared for surgery.  HPI/Subjective: Some soreness in his stomach but overall feels better. Tolerating diet.  Assessment/Plan:  Active Problems:   Hypertension   Hyperlipidemia   CAD (coronary artery disease)   Non-insulin dependent type 2 diabetes mellitus ([facility])   Chest pain   Acute cholecystitis   RUQ abdominal pain   Cholecystitis   Acute cholecystitis:  Stable, status post surgery 8/14. Continue empiric antibiotics. Further post operative care per CCS. JP drain in place, per general surgery had increased bleeding secondary to Troy Boyer type.  Acute blood loss anemia -Hemoglobin baseline is 12.3 on admission, hemoglobin dropped down to 7.0, transfusing 1 unit of packed RBCs. -Check hemoglobin in a.m.  Choledocholithiasis:  -Seen by GI, ERCP planned for 8/17.  DM type 2:  remains stable and with fairly well controlled CBG's. Will continue SSI  CAD s/p stent 2016:  Appears stable. Per cardiology continue statins, B-blocker and ACEI. Resume Brillinta when ok with surgery/GI.  Essential HTN:  will continue current antihypertensive regimen. BP is stable.   DVT prophylaxis: SCDs Code Status: full Family Communication: none Disposition Plan: home  Consultants:  Surgery  Cardiology   GI (Dr. Ardis Hughs)  Procedures:  LAPAROSCOPIC CHOLECYSTECTOMY WITH INTRAOPERATIVE CHOLANGIOGRAM (N/A)  ERCP: planned for 8/17 tentatively   Antimicrobials:  Zosyn 8/13 >>   Objective: Vitals:   02/10/16 2136 02/11/16 0552 02/11/16 1332 02/11/16 1345  BP: 135/65 (!) 148/71 120/61 120/64  Pulse: (!) 101 (!) 102 88 84  Resp: 20 18 18 18   Temp: 99.3 F (37.4 C) 99.4 F (37.4 C) 99.1 F (37.3 C) 99.1  F (37.3 C)  TempSrc: Oral Oral Oral Oral  SpO2: 98% 95% 94% 93%  Weight:      Height:        Intake/Output Summary (Last 24 hours) at 02/11/16 1432 Last data filed at 02/11/16 1338  Gross per 24 hour  Intake              410 ml  Output              805 ml  Net             -395 ml     Filed Weights   02/07/16 0944 02/07/16 1426  Weight: 73.5 kg (162 lb) 73.5 kg (162 lb)    Exam: Constitutional:  . Appears calm and comfortable; reports pain to be mild. No nausea or vomiting. Denies  CP and SOB. Patient is afebrile  Respiratory:  . CTA bilaterally, no w/r/r.  . Respiratory effort normal. No retractions or accessory muscle use Cardiovascular:  . RRR, no m/r/g . No LE extremity edema   Abdomen  Soft, mild tenderness on palpation, no guarding, positive BS  Holes from laparoscopic procedure stable  JP drain with red output  Psychiatric:  . judgement and insight appear normal . Mental status o Mood, affect appropriate  I have personally reviewed following labs and imaging studies:  Blood sugars stable  CMET with normal electrolytes and renal function. LFT's still elevated, but trending down.   Last CBC on 8/14 demonstrated resolution of leukocytosis and stable Hgb  Scheduled Meds: . sodium chloride   Intravenous Once  . sodium chloride   Intravenous Once  . amLODipine  5 mg Oral Daily  .  antiseptic oral rinse  7 mL Mouth Rinse BID  . atorvastatin  80 mg Oral Daily  . insulin aspart  0-9 Units Subcutaneous TID WC  . metoprolol tartrate  25 mg Oral Daily  . piperacillin-tazobactam (ZOSYN)  IV  3.375 g Intravenous Q8H  . ramipril  10 mg Oral BID  . sodium chloride flush  3 mL Intravenous Q12H   Continuous Infusions: . sodium chloride 100 mL/hr at 02/11/16 0425    Active Problems:   Hypertension   Hyperlipidemia   CAD (coronary artery disease)   Non-insulin dependent type 2 diabetes mellitus ([place])   Chest pain   Acute cholecystitis   RUQ abdominal pain    Cholecystitis  [name] [phone]   LOS: 4 days   Time spent 25 minutes

## 2016-02-11 NOTE — Progress Notes (Signed)
[facility] Progress Note  Subjective: Patient is walking in room and eating breakfast. Denies any pain. Having bowel movements and passing gas. Had cholecystectomy on 8/14.  Scheduled for ERCP tomorrow at 1300.   Objective:  Vital signs in last 24 hours: Temp:  [99.1 F (37.3 C)-99.4 F (37.4 C)] 99.4 F (37.4 C) (08/16 0552) Pulse Rate:  [101-102] 102 (08/16 0552) Resp:  [18-20] 18 (08/16 0552) BP: (95-148)/(58-71) 148/71 (08/16 0552) SpO2:  [95 %-98 %] 95 % (08/16 0552) Last BM Date: 02/10/16  General:   Alert,  Well-developed,    in NAD Heart:  Regular rate and rhythm; no murmurs Pulm: Clear to ausculation bilaterally Abdomen:  Soft, mildly tender to palpation and nondistended. Normal bowel sounds, without guarding, and without rebound. Port site incisions C/D/I  Extremities:  Without edema. Neurologic:  Alert and  oriented x4;  grossly normal neurologically. Psych:  Alert and cooperative. Normal mood and affect.  Intake/Output from previous day: 08/15 0701 - 08/16 0700 In: 425 [P.O.:25; I.V.:400] Out: 655 [Urine:600; Drains:55] Intake/Output this shift: No intake/output data recorded.  Lab Results:  Recent Labs  02/09/16 0316 02/09/16 2339 02/11/16 0513 02/11/16 0801  WBC 9.1 11.0*  --  9.2  HGB 12.3* 9.2* 7.1* 7.0*  HCT 38.6* 28.5* 22.0* 22.0*  PLT 304 373  --  335   BMET  Recent Labs  02/09/16 0316 02/10/16 0459  NA 138 137  K 3.5 4.0  CL 104 105  CO2 24 25  GLUCOSE 103* 168*  BUN 7 12  CREATININE 0.89 1.01  CALCIUM 8.3* 7.7*   LFT  Recent Labs  02/09/16 0316 02/10/16 0459  PROT 5.9* 4.7*  ALBUMIN 2.9* 2.2*  AST 509* 237*  ALT 450* 372*  ALKPHOS 767* 659*  BILITOT 2.3* 1.7*  BILIDIR 1.5*  --   IBILI 0.8  --    PT/INR  Recent Labs  02/09/16 0316  LABPROT 15.6*  INR 1.23   Hepatitis Panel No results for input(s): HEPBSAG, HCVAB, HEPAIGM, HEPBIGM in the last 72 hours.  Dg Cholangiogram Operative  Result  Date: 02/09/2016 CLINICAL DATA:  Acute calculus cholecystitis EXAM: INTRAOPERATIVE CHOLANGIOGRAM TECHNIQUE: Cholangiographic images from the C-arm fluoroscopic device were submitted for interpretation post-operatively. Please see the procedural report for the amount of contrast and the fluoroscopy time utilized. COMPARISON:  Prior abdominal ultrasound 02/07/2016 FINDINGS: Images from an intraoperative cholangiogram obtained at the time of laparoscopic cholecystectomy demonstrate cannulation of the cystic duct remanent and opacification of the biliary tree. There is mild intrahepatic biliary ductal dilatation and moderate dilatation of the common bile duct. Irregular filling defects within the distal common bile duct may represent sludge or small stones. The ampulla is obstructed. There is extravasation of contrast material during the injection. IMPRESSION: 1. Biliary ductal dilatation with irregular filling defects in the distal common bile duct concerning for sludge and/or small stones. 2. Intra and extrahepatic biliary ductal dilatation. Electronically Signed   By: Jacqulynn Cadet M.D.   On: 02/09/2016 13:28    Assessment  Choledocholithiasis: found on IOC. LFT, bilirubin, and Alk Phos elevated but trending down now. INR and prothromin time are within normal limits. Last dose of Brilinta was Wednesday per patient.  *elevated LFTs: secondary to Cholecystitis and possible Choledocholithiasis trending down today.    *Chronic use of brilinta: secondary to cardiac stents.  Plan: Scheduled ERCP for tomorrow. Continue to follow.   LOS: 4 days   Grayland Ormond PA-S 02/11/2016, 10:27 AM  I have discussed the case with the PA student, and that is the plan I formulated. I personally interviewed and examined the patient.  CC: choledocholithiasis  Patient still has right sided abd pain, and his JP drainage is frankly bloody.  Hgb 7.0 this AM. LFTs are improving.  Reported his last Brillinta dose a  week ago today.  He will be transfused 2 units PRBCs today.  ERCp is scheduled for tomorrow.  However, if he continues to drop his Hgb, raising concern for continued antiplatelet effect of meds, we may delay his procedure until the following day as long as he shows no signs of worsening biliary obstruction and does not develop signs of cholangitis. He and his daughter are aware.    [name] Pager [phone]  Mon-Fri 8a-5p [phone] after 5p, weekends, holidays

## 2016-02-12 ENCOUNTER — Encounter (HOSPITAL_COMMUNITY)
Admission: EM | Disposition: A | Discharge status: Home / Self Care | Payer: Self-pay | Source: Home / Self Care | Attending: Internal Medicine

## 2016-02-12 ENCOUNTER — Encounter (HOSPITAL_COMMUNITY): Payer: Self-pay | Admitting: Certified Registered Nurse Anesthetist

## 2016-02-12 DIAGNOSIS — E785 Hyperlipidemia, unspecified: Secondary | ICD-10-CM

## 2016-02-12 DIAGNOSIS — R7989 Other specified abnormal findings of blood chemistry: Secondary | ICD-10-CM

## 2016-02-12 DIAGNOSIS — R945 Abnormal results of liver function studies: Secondary | ICD-10-CM

## 2016-02-12 DIAGNOSIS — K8042 Calculus of bile duct with acute cholecystitis without obstruction: Secondary | ICD-10-CM

## 2016-02-12 LAB — CBC
HEMATOCRIT: 26.9 % — AB (ref 39.0–52.0)
HEMATOCRIT: 29 % — AB (ref 39.0–52.0)
HEMOGLOBIN: 8.6 g/dL — AB (ref 13.0–17.0)
HEMOGLOBIN: 9.4 g/dL — AB (ref 13.0–17.0)
MCH: 28.9 pg (ref 26.0–34.0)
MCH: 29.3 pg (ref 26.0–34.0)
MCHC: 32 g/dL (ref 30.0–36.0)
MCHC: 32.4 g/dL (ref 30.0–36.0)
MCV: 90.3 fL (ref 78.0–100.0)
MCV: 90.3 fL (ref 78.0–100.0)
Platelets: 347 10*3/uL (ref 150–400)
Platelets: 404 10*3/uL — ABNORMAL HIGH (ref 150–400)
RBC: 2.98 MIL/uL — AB (ref 4.22–5.81)
RBC: 3.21 MIL/uL — ABNORMAL LOW (ref 4.22–5.81)
RDW: 17.2 % — ABNORMAL HIGH (ref 11.5–15.5)
RDW: 16.9 % — ABNORMAL HIGH (ref 11.5–15.5)
WBC: 9 10*3/uL (ref 4.0–10.5)
WBC: 9.3 10*3/uL (ref 4.0–10.5)

## 2016-02-12 LAB — TYPE AND SCREEN
ABO/RH(D): B POS
ANTIBODY SCREEN: NEGATIVE
Unit division: 0
Unit division: 0

## 2016-02-12 LAB — GLUCOSE, CAPILLARY
GLUCOSE-CAPILLARY: 160 mg/dL — AB (ref 65–99)
Glucose-Capillary: 118 mg/dL — ABNORMAL HIGH (ref 65–99)
Glucose-Capillary: 111 mg/dL — ABNORMAL HIGH (ref 65–99)
Glucose-Capillary: 180 mg/dL — ABNORMAL HIGH (ref 65–99)

## 2016-02-12 SURGERY — ERCP, WITH INTERVENTION IF INDICATED
Anesthesia: General

## 2016-02-12 SURGERY — CANCELLED PROCEDURE

## 2016-02-12 MED ORDER — SODIUM CHLORIDE 0.9 % IV SOLN
INTRAVENOUS | Status: DC
  Administered 2016-02-12: 09:00:00 via INTRAVENOUS

## 2016-02-12 MED ORDER — INDOMETHACIN 50 MG RE SUPP: 100.0000 mg | Freq: Once | RECTAL | Status: DC

## 2016-02-12 NOTE — Progress Notes (Signed)
PROGRESS NOTE  Troy Boyer [id number] DOB: 1951-02-15 DOA: 02/07/2016 PCP: [name], FNP  Brief Narrative: 65 year old man with diabetes, coronary artery disease, presented with abdominal pain, nausea, vomiting. Admitted for acute cholecystitis. Seen by cardiology, cleared for surgery.  HPI/Subjective: Still complaining about mild to moderate RUQ abd pain. ERCP later today, likely home in AM if OK with services.  Assessment/Plan:  Active Problems:   Hypertension   Hyperlipidemia   CAD (coronary artery disease)   Non-insulin dependent type 2 diabetes mellitus (HCC)   Chest pain   Acute cholecystitis   RUQ abdominal pain   Cholecystitis   Acute cholecystitis:  -Stable, status post surgery 8/14. Continue empiric antibiotics. Further post operative care per [facility]. -JP drain in place, per general surgery had increased bleeding secondary to Brilinta. -Still on Zosyn.  Acute blood loss anemia -Hemoglobin baseline is 12.3 on admission, hemoglobin dropped down to 7.0, S/P transfusion of 2 units of packed RBC. -Hb improved to 9.2.  Choledocholithiasis:  -Seen by GI, ERCP planned for  Today at 1 PM  DM type 2:  remains stable and with fairly well controlled CBG's. Will continue SSI  CAD s/p stent 2016:  Appears stable. Per cardiology continue statins, B-blocker and ACEI. Resume Brillinta when ok with surgery/GI.  Essential HTN:  will continue current antihypertensive regimen. BP is stable.   DVT prophylaxis: SCDs Code Status: full Family Communication: none Disposition Plan: home  Consultants:  Surgery  Cardiology   GI (Dr. Ardis Hughs)  Procedures:  LAPAROSCOPIC CHOLECYSTECTOMY WITH INTRAOPERATIVE CHOLANGIOGRAM (N/A)  ERCP: planned for 8/17 tentatively   Antimicrobials:  Zosyn 8/13 >>   Objective: Vitals:   02/11/16 1645 02/11/16 1715 02/11/16 1945 02/12/16 0610  BP: 133/73 (!) 147/77 135/67 128/63  Pulse: 85 89 83 78  Resp: 16 18 18 16   Temp:  99.5 F (37.5 C) 99.5 F (37.5 C) 98.6 F (37 C) 98.5 F (36.9 C)  TempSrc: Oral Oral Oral Oral  SpO2: 96% 95% 96% 98%  Weight:      Height:        Intake/Output Summary (Last 24 hours) at 02/12/16 0943 Last data filed at 02/12/16 0935  Gross per 24 hour  Intake          4115.83 ml  Output             1750 ml  Net          2365.83 ml     Filed Weights   02/07/16 0944 02/07/16 1426  Weight: 73.5 kg (162 lb) 73.5 kg (162 lb)    Exam: Constitutional:  . Appears calm and comfortable; reports pain to be mild. No nausea or vomiting. Denies  CP and SOB. Patient is afebrile  Respiratory:  . CTA bilaterally, no w/r/r.  . Respiratory effort normal. No retractions or accessory muscle use Cardiovascular:  . RRR, no m/r/g . No LE extremity edema   Abdomen  Soft, mild tenderness on palpation, no guarding, positive BS  Holes from laparoscopic procedure stable  JP drain with red output  Psychiatric:  . judgement and insight appear normal . Mental status o Mood, affect appropriate  I have personally reviewed following labs and imaging studies:  Blood sugars stable  CMET with normal electrolytes and renal function. LFT's still elevated, but trending down.   Last CBC on 8/14 demonstrated resolution of leukocytosis and stable Hgb  Scheduled Meds: . amLODipine  5 mg Oral Daily  . antiseptic oral rinse  7 mL Mouth Rinse BID  .  atorvastatin  80 mg Oral Daily  . indomethacin  100 mg Rectal Once  . insulin aspart  0-9 Units Subcutaneous TID WC  . metoprolol tartrate  25 mg Oral Daily  . piperacillin-tazobactam (ZOSYN)  IV  3.375 g Intravenous Q8H  . ramipril  10 mg Oral BID  . sodium chloride flush  3 mL Intravenous Q12H   Continuous Infusions: . sodium chloride 75 mL/hr at 02/12/16 0900  . sodium chloride      Active Problems:   Hypertension   Hyperlipidemia   CAD (coronary artery disease)   Non-insulin dependent type 2 diabetes mellitus ([place])   Chest pain   Acute  cholecystitis   RUQ abdominal pain   Cholecystitis  Nevaeh Casillas A [phone]   LOS: 5 days   Time spent 25 minutes

## 2016-02-12 NOTE — Progress Notes (Signed)
ERCP cancelled for today per Dr. Loletha Carrow after discussion with surgeon. Dr. Loletha Carrow notified patient and wife of this. They agree and express understanding.

## 2016-02-12 NOTE — Progress Notes (Addendum)
Patient seen in pre-endoscopy area.  [facility] Progress Note  Chief Complaint: CBD stone  Subjective  History:  He continues to have RUQ pain and his JP drainage is increased and bloody.  ROS: Cardiovascular:  no chest pain Respiratory: no dyspnea  Objective:  Med list reviewed  Vital signs in last 24 hrs: Vitals:   02/12/16 1244 02/12/16 1327  BP: (!) 152/78   Pulse: 79 78  Resp: 18 18  Temp: 98.3 F (36.8 C) 98.3 F (36.8 C)    Physical Exam   HEENT: sclera anicteric, oral mucosa moist without lesions  Neck: supple, no thyromegaly, JVD or lymphadenopathy  Cardiac: RRR without murmurs, S1S2 heard, no peripheral edema  Pulm: clear to auscultation bilaterally, normal RR and effort noted  Abdomen: soft, moderate RUQ tenderness, with active bowel sounds. No guarding or palpable hepatosplenomegaly.  JP drain has frank blood.  It was reportedly emptied again since I saw him this AM.  Skin; warm and dry, no jaundice or rash  Recent Labs:   Recent Labs Lab 02/11/16 0801 02/11/16 2201 02/12/16 0941  WBC 9.2 10.7* 9.0  HGB 7.0* 9.2* 8.6*  HCT 22.0* 28.3* 26.9*  PLT 335 357 347    Recent Labs Lab 02/10/16 0459  NA 137  K 4.0  CL 105  CO2 25  BUN 12  ALBUMIN 2.2*  ALKPHOS 659*  ALT 372*  AST 237*  GLUCOSE 168*    Recent Labs Lab 02/09/16 0316  INR 1.23    @ASSESSMENTPLANBEGIN @ Assessment and Plan:  CBD stones Elevated LFTs Cholecystitis CAD with prior coronary stent.   The JP drainage had become more frankly bloody since I saw him this AM.  Hgb had dropped from 9.2 at 22:00 to 8.6 at 09:40  Surgery had been by in the AM, and there were reports that Dr Brantley Stage was considering need for surgical intervention.  I spoke to Dr Brantley Stage, who felt the increased JP bleeding was most likely due to the patient's increased ambulation and mobilization of post-op inflammatory fluid and blood.  He is not currently planning repeat surgery, planning to  closely observe the output and hgb level.  I feel that if this bleeding is even partially due to continued anti-platelet effect of Brillinta, then we run the risk of significant post-sphincterotomy bleeding.  As the LFTs were improving as of 8/15, and there is no jaundice or evidence of cholangitis, then the risk/benefit ratio favors delaying ERCP until post-op bleeding stops. Explained in detail to patient and his wife, and they understand.  We will check a CMP in AM tomorrow and also ask for a formal cardiology consult to know whether or not this patient needs to go back on anti-platelet therapy and how soon. That might affect the timing of ERCP.  We will continue to follow closely.  Total time 30 minutes, over half spent on phone calls and care management. [name] Pager [phone] Mon-Fri 8a-5p [phone] after 5p, weekends, holidays

## 2016-02-12 NOTE — Anesthesia Preprocedure Evaluation (Deleted)
Anesthesia Evaluation  Patient identified by MRN, date of birth, ID band Patient awake    Reviewed: Allergy & Precautions, NPO status , Patient's Chart, lab work & pertinent test results  Airway        Dental   Pulmonary former smoker,           Cardiovascular hypertension,      Neuro/Psych    GI/Hepatic   Endo/Other  diabetes  Renal/GU      Musculoskeletal   Abdominal   Peds  Hematology   Anesthesia Other Findings   Reproductive/Obstetrics                             Anesthesia Physical Anesthesia Plan  ASA: III  Anesthesia Plan: General   Post-op Pain Management:    Induction: Intravenous  Airway Management Planned: Oral ETT  Additional Equipment:   Intra-op Plan:   Post-operative Plan: Extubation in OR  Informed Consent: I have reviewed the patients History and Physical, chart, labs and discussed the procedure including the risks, benefits and alternatives for the proposed anesthesia with the patient or authorized representative who has indicated his/her understanding and acceptance.     Plan Discussed with: CRNA and Anesthesiologist  Anesthesia Plan Comments:         Anesthesia Quick Evaluation

## 2016-02-12 NOTE — Progress Notes (Signed)
Patient visited.  JP drainage less bloody, still had 750 cc output last 24 hrs, raising concern for possible bile leak. Last Brillinta dose 8 days ago.  Hgb rose appropriately to 9.0 after 2 units PRBCs yesterday.  Proceed with ERCP today at 1pm

## 2016-02-12 NOTE — Progress Notes (Signed)
Patient arrived to endoscopy for scheduled ERCP today. Per patient he has conversation with Dr. Brantley Stage this AM and he stated he would like to postpone ERCP due to hemoglobin. JP drain emptied in endoscopy 156mL of bloody drainage. Dr. Loletha Carrow notified of JP drainage and patient report. Dr. Loletha Carrow to discuss with Dr. Brantley Stage. Patient and wife express understanding of this.

## 2016-02-12 NOTE — Care Management Note (Signed)
Case Management Note  Patient Details  Name: Anuj Gunnoe MRN: [id number] Date of Birth: 13-Apr-1951  Subjective/Objective:  Pt Hgb 7.0 8/16 and received 2U PRBC. Will have ERCP today.                   Action/Plan: CM will continue to follow   Expected Discharge Date:                  Expected Discharge Plan:  Home/Self Care  In-House Referral:  NA  Discharge planning Services  CM Consult  Post Acute Care Choice:  NA Choice offered to:  NA  DME Arranged:  N/A DME Agency:  NA  HH Arranged:  NA HH Agency:  NA  Status of Service:  Completed, signed off  If discussed at [place] of Stay Meetings, dates discussed:  02/12/2016  Additional Comments:  Delrae Sawyers, RN 02/12/2016, 2:31 PM

## 2016-02-12 NOTE — Progress Notes (Signed)
[facility] Progress Note  3 Days Post-Op  Subjective: Feels better after 2 units pRBCs (hgb 9.0) . Continues to have abdominal soreness, controlled with pain meds. Last BM was this morning and was normal. Denies weakness, fever, chills, nausea, vomiting. Ambulating.  JP drain output increased yesterday - 750cc in 24/h. Concerning for possible bile leak. Patient to receive ERCP today at 1:00 PM.  Objective: Vital signs in last 24 hours: Temp:  [98.5 F (36.9 C)-99.5 F (37.5 C)] 98.5 F (36.9 C) (08/17 0610) Pulse Rate:  [78-89] 78 (08/17 0610) Resp:  [16-18] 16 (08/17 0610) BP: (120-147)/(61-77) 128/63 (08/17 0610) SpO2:  [93 %-98 %] 98 % (08/17 0610) Last BM Date: 02/10/16  Intake/Output from previous day: 08/16 0701 - 08/17 0700 In: 3845.8 [P.O.:360; I.V.:2740.8; Blood:265; IV Piggyback:480] Out: [id number] [Urine:1300; Drains:370] Intake/Output this shift: Total I/O In: 270 [I.V.:270] Out: 80 [Drains:80]  PE: Gen:  Alert, NAD, pleasant Abd: Soft, appropriately tender, ND, +BS, incisions C/D/I, drain with sanguinous drainage  Lab Results:   Recent Labs  02/11/16 0801 02/11/16 2201  WBC 9.2 10.7*  HGB 7.0* 9.2*  HCT 22.0* 28.3*  PLT 335 357   BMET  Recent Labs  02/10/16 0459  NA 137  K 4.0  CL 105  CO2 25  GLUCOSE 168*  BUN 12  CREATININE 1.01  CALCIUM 7.7*   PT/INR No results for input(s): LABPROT, INR in the last 72 hours. CMP     Component Value Date/Time   NA 137 02/10/2016 0459   K 4.0 02/10/2016 0459   CL 105 02/10/2016 0459   CO2 25 02/10/2016 0459   GLUCOSE 168 (H) 02/10/2016 0459   BUN 12 02/10/2016 0459   CREATININE 1.01 02/10/2016 0459   CREATININE 0.90 09/18/2014 0927   CALCIUM 7.7 (L) 02/10/2016 0459   PROT 4.7 (L) 02/10/2016 0459   ALBUMIN 2.2 (L) 02/10/2016 0459   AST 237 (H) 02/10/2016 0459   ALT 372 (H) 02/10/2016 0459   ALKPHOS 659 (H) 02/10/2016 0459   BILITOT 1.7 (H) 02/10/2016 0459   GFRNONAA >60 02/10/2016  0459   GFRNONAA >89 09/18/2014 0927   GFRAA >60 02/10/2016 0459   GFRAA >89 09/18/2014 0927   Lipase     Component Value Date/Time   LIPASE 30 02/07/2016 1050   Studies/Results: No results found.  Anti-infectives: Anti-infectives    Start     Dose/Rate Route Frequency Ordered Stop   02/08/16 2000  piperacillin-tazobactam (ZOSYN) IVPB 3.375 g     3.375 g 12.5 mL/hr over 240 Minutes Intravenous Every 8 hours 02/08/16 1941     02/08/16 1200  cefTRIAXone (ROCEPHIN) 2 g in dextrose 5 % 50 mL IVPB  Status:  Discontinued     2 g 100 mL/hr over 30 Minutes Intravenous Every 24 hours 02/07/16 1540 02/08/16 1932   02/07/16 1200  cefTRIAXone (ROCEPHIN) 2 g in dextrose 5 % 50 mL IVPB     2 g 100 mL/hr over 30 Minutes Intravenous  Once 02/07/16 1147 02/07/16 1255     Assessment/Plan RUQ pain Acute Cholecystitis - s/p lap chole with IOC by Dr. Marcello Moores Cornett (02/09/16) Choledocholithiasis - appreciated on IOC - JP drain in RUQ, patient did have increased bleeding during surgery secondary to brillinta  - hgb rose appropriately to 9.0 from 7.0 yesterday after 2 units pRBCs - JP output increased today - 750 cc, concerning for possible bile leak - ERCP today at 1:00 PM   HTN HLD CAD - on Brillinta for  cardiac stents, day#8 of med being held DM II  FEN: NPO for ERCP ID: Zosyn DVT Proph: SCD's   LOS: 5 days    [name] , [facility] 02/12/2016, 9:40 AM Pager: [phone] Consults: [phone] Mon-Fri 7:00 am-4:30 pm Sat-Sun 7:00 am-11:30 am

## 2016-02-13 ENCOUNTER — Encounter (HOSPITAL_COMMUNITY): Payer: Self-pay

## 2016-02-13 ENCOUNTER — Inpatient Hospital Stay (HOSPITAL_COMMUNITY): Payer: No Typology Code available for payment source

## 2016-02-13 ENCOUNTER — Inpatient Hospital Stay (HOSPITAL_COMMUNITY): Payer: No Typology Code available for payment source | Admitting: Anesthesiology

## 2016-02-13 ENCOUNTER — Encounter (HOSPITAL_COMMUNITY)
Admission: EM | Disposition: A | Discharge status: Home / Self Care | Payer: Self-pay | Source: Home / Self Care | Attending: Internal Medicine

## 2016-02-13 HISTORY — PX: ERCP: SHX5425

## 2016-02-13 LAB — GLUCOSE, CAPILLARY
Glucose-Capillary: 135 mg/dL — ABNORMAL HIGH (ref 65–99)
Glucose-Capillary: 127 mg/dL — ABNORMAL HIGH (ref 65–99)
Glucose-Capillary: 203 mg/dL — ABNORMAL HIGH (ref 65–99)
Glucose-Capillary: 221 mg/dL — ABNORMAL HIGH (ref 65–99)

## 2016-02-13 LAB — COMPREHENSIVE METABOLIC PANEL
ALBUMIN: 2.5 g/dL — AB (ref 3.5–5.0)
ALT: 110 U/L — AB (ref 17–63)
AST: 38 U/L (ref 15–41)
Alkaline Phosphatase: 520 U/L — ABNORMAL HIGH (ref 38–126)
Anion gap: 9 (ref 5–15)
BUN: 6 mg/dL (ref 6–20)
CHLORIDE: 103 mmol/L (ref 101–111)
CO2: 26 mmol/L (ref 22–32)
CREATININE: 0.73 mg/dL (ref 0.61–1.24)
Calcium: 8.3 mg/dL — ABNORMAL LOW (ref 8.9–10.3)
GFR calc Af Amer: >60 mL/min (ref >60)
GLUCOSE: 120 mg/dL — AB (ref 65–99)
POTASSIUM: 3.2 mmol/L — AB (ref 3.5–5.1)
Sodium: 138 mmol/L (ref 135–145)
Total Bilirubin: 0.8 mg/dL (ref 0.3–1.2)
Total Protein: 5.7 g/dL — ABNORMAL LOW (ref 6.5–8.1)

## 2016-02-13 LAB — CBC
HEMATOCRIT: 26.9 % — AB (ref 39.0–52.0)
Hemoglobin: 8.5 g/dL — ABNORMAL LOW (ref 13.0–17.0)
MCH: 28.6 pg (ref 26.0–34.0)
MCHC: 31.6 g/dL (ref 30.0–36.0)
MCV: 90.6 fL (ref 78.0–100.0)
PLATELETS: 401 10*3/uL — AB (ref 150–400)
RBC: 2.97 MIL/uL — ABNORMAL LOW (ref 4.22–5.81)
RDW: 16.9 % — AB (ref 11.5–15.5)
WBC: 8.3 10*3/uL (ref 4.0–10.5)

## 2016-02-13 SURGERY — ERCP, WITH INTERVENTION IF INDICATED
Anesthesia: General

## 2016-02-13 MED ORDER — LIDOCAINE HCL (CARDIAC) 20 MG/ML IV SOLN
INTRAVENOUS | Status: DC | PRN
  Administered 2016-02-13: 60 mg via INTRAVENOUS

## 2016-02-13 MED ORDER — SODIUM CHLORIDE 0.9 % IV SOLN
INTRAVENOUS | Status: DC | PRN
  Administered 2016-02-13: 40 mL

## 2016-02-13 MED ORDER — ARTIFICIAL TEARS OP OINT
TOPICAL_OINTMENT | OPHTHALMIC | Status: DC | PRN
  Administered 2016-02-13: 1 via OPHTHALMIC

## 2016-02-13 MED ORDER — LACTATED RINGERS IV SOLN
INTRAVENOUS | Status: DC | PRN
  Administered 2016-02-13: 13:00:00 via INTRAVENOUS

## 2016-02-13 MED ORDER — PROPOFOL 10 MG/ML IV BOLUS
INTRAVENOUS | Status: DC | PRN
  Administered 2016-02-13: 200 mg via INTRAVENOUS

## 2016-02-13 MED ORDER — PHENYLEPHRINE HCL 10 MG/ML IJ SOLN
INTRAVENOUS | Status: DC | PRN
  Administered 2016-02-13: 20 ug/min via INTRAVENOUS

## 2016-02-13 MED ORDER — FENTANYL CITRATE (PF) 100 MCG/2ML IJ SOLN
INTRAMUSCULAR | Status: AC
  Filled 2016-02-13: qty 2

## 2016-02-13 MED ORDER — ONDANSETRON HCL 4 MG/2ML IJ SOLN
INTRAMUSCULAR | Status: DC | PRN
  Administered 2016-02-13: 4 mg via INTRAVENOUS

## 2016-02-13 MED ORDER — INDOMETHACIN 50 MG RE SUPP
RECTAL | Status: DC | PRN
  Administered 2016-02-13: 100 mg via RECTAL

## 2016-02-13 MED ORDER — GLUCAGON HCL RDNA (DIAGNOSTIC) 1 MG IJ SOLR
INTRAMUSCULAR | Status: AC
  Filled 2016-02-13: qty 1

## 2016-02-13 MED ORDER — GLUCAGON HCL RDNA (DIAGNOSTIC) 1 MG IJ SOLR
INTRAMUSCULAR | Status: DC | PRN
  Administered 2016-02-13: .5 mg via INTRAVENOUS

## 2016-02-13 MED ORDER — SUCCINYLCHOLINE CHLORIDE 20 MG/ML IJ SOLN
INTRAMUSCULAR | Status: DC | PRN
  Administered 2016-02-13: 70 mg via INTRAVENOUS

## 2016-02-13 MED ORDER — POTASSIUM CHLORIDE 10 MEQ/100ML IV SOLN
10.0000 meq | INTRAVENOUS | Status: AC
  Administered 2016-02-13 (×3): 10 meq via INTRAVENOUS
  Filled 2016-02-13 (×3): qty 100

## 2016-02-13 MED ORDER — IOPAMIDOL (ISOVUE-300) INJECTION 61%
INTRAVENOUS | Status: AC
  Filled 2016-02-13: qty 50

## 2016-02-13 MED ORDER — FENTANYL CITRATE (PF) 100 MCG/2ML IJ SOLN
INTRAMUSCULAR | Status: DC | PRN
  Administered 2016-02-13 (×2): 50 ug via INTRAVENOUS

## 2016-02-13 MED ORDER — INDOMETHACIN 50 MG RE SUPP
RECTAL | Status: AC
  Filled 2016-02-13: qty 2

## 2016-02-13 MED ORDER — DEXAMETHASONE SODIUM PHOSPHATE 10 MG/ML IJ SOLN
INTRAMUSCULAR | Status: DC | PRN
  Administered 2016-02-13: 4 mg via INTRAVENOUS

## 2016-02-13 MED ORDER — PHENYLEPHRINE HCL 10 MG/ML IJ SOLN
INTRAMUSCULAR | Status: DC | PRN
  Administered 2016-02-13 (×2): 40 ug via INTRAVENOUS
  Administered 2016-02-13: 80 ug via INTRAVENOUS
  Administered 2016-02-13: 40 ug via INTRAVENOUS

## 2016-02-13 MED ORDER — POTASSIUM CHLORIDE CRYS ER 20 MEQ PO TBCR
60.0000 meq | EXTENDED_RELEASE_TABLET | Freq: Once | ORAL | Status: AC
  Administered 2016-02-13: 60 meq via ORAL
  Filled 2016-02-13: qty 3

## 2016-02-13 NOTE — Interval H&P Note (Signed)
History and Physical Interval Note:  02/13/2016 12:51 PM  Troy Boyer  has presented today for surgery, with the diagnosis of cbd stones  The various methods of treatment have been discussed with the patient and family. After consideration of risks, benefits and other options for treatment, the patient has consented to  Procedure(s): ENDOSCOPIC RETROGRADE CHOLANGIOPANCREATOGRAPHY (ERCP) (N/A) as a surgical intervention .  The patient's history has been reviewed, patient examined, no change in status, stable for surgery.  I have reviewed the patient's chart and labs.  Questions were answered to the patient's satisfaction.      See note from earlier this AM as well.  [name]

## 2016-02-13 NOTE — Anesthesia Preprocedure Evaluation (Addendum)
Anesthesia Evaluation  Patient identified by MRN, date of birth, ID band Patient awake    Reviewed: Allergy & Precautions, NPO status , Patient's Chart, lab work & pertinent test results  Airway Mallampati: II  TM Distance: >3 FB Neck ROM: Full    Dental  (+) Teeth Intact, Dental Advisory Given   Pulmonary former smoker,    Pulmonary exam normal breath sounds clear to auscultation       Cardiovascular hypertension, + CAD, + Past MI and + Cardiac Stents   Rhythm:Regular Rate:Normal     Neuro/Psych    GI/Hepatic   Endo/Other  diabetes  Renal/GU      Musculoskeletal   Abdominal   Peds  Hematology   Anesthesia Other Findings   Reproductive/Obstetrics                           Anesthesia Physical  Anesthesia Plan  ASA: III  Anesthesia Plan: General   Post-op Pain Management:    Induction: Intravenous  Airway Management Planned: Oral ETT  Additional Equipment:   Intra-op Plan:   Post-operative Plan: Extubation in OR  Informed Consent: I have reviewed the patients History and Physical, chart, labs and discussed the procedure including the risks, benefits and alternatives for the proposed anesthesia with the patient or authorized representative who has indicated his/her understanding and acceptance.     Plan Discussed with: CRNA and Anesthesiologist  Anesthesia Plan Comments: (Cardiology following for heart stents, stable, placed in 2016, the brillinta is being held in perioperative time and they are aware, may restart after Gi procedure)       Anesthesia Quick Evaluation

## 2016-02-13 NOTE — Progress Notes (Signed)
Surgical note and I/O noted.  AM labs reviewed.  ERCP today about 1pm.  Patient notified.

## 2016-02-13 NOTE — Anesthesia Procedure Notes (Signed)
Procedure Name: Intubation Date/Time: 02/13/2016 1:09 PM Performed by: Merrilyn Puma B Pre-anesthesia Checklist: Patient identified, Emergency Drugs available, Suction available, Patient being monitored and Timeout performed Patient Re-evaluated:Patient Re-evaluated prior to inductionOxygen Delivery Method: Circle system utilized Preoxygenation: Pre-oxygenation with 100% oxygen Intubation Type: IV induction Ventilation: Mask ventilation without difficulty Laryngoscope Size: Miller and 2 Grade View: Grade I Tube type: Oral Tube size: 7.5 mm Number of attempts: 1 Airway Equipment and Method: Stylet Placement Confirmation: ETT inserted through vocal cords under direct vision,  positive ETCO2 and breath sounds checked- equal and bilateral Secured at: 22 cm Tube secured with: Tape Dental Injury: Teeth and Oropharynx as per pre-operative assessment

## 2016-02-13 NOTE — Op Note (Signed)
[facility] Patient Name: Troy Boyer Procedure Date : 02/13/2016 MRN: [id number] Attending MD: Estill Cotta. Loletha Carrow , MD Date of Birth: Mar 30, 1951 CSN: [id number] Age: 65 Admit Type: Inpatient Procedure:                ERCP Indications:              For therapy of bile duct stone(s) Providers:                Estill Cotta. Loletha Carrow, MD, Kingsley Plan, RN, Elspeth Cho Tech., Technician, Ivar Drape CRNA, CRNA Referring MD:             Erroll Luna, MD Medicines:                General Anesthesia, zosyn 3.375 grams, Indomethacin                            [id number] mg PR Complications:            No immediate complications. Estimated Blood Loss:     Estimated blood loss: none. Procedure:                Pre-Anesthesia Assessment:                           - Prior to the procedure, a History and Physical                            was performed, and patient medications and                            allergies were reviewed. The patient's tolerance of                            previous anesthesia was also reviewed. The risks                            and benefits of the procedure and the sedation                            options and risks were discussed with the patient.                            All questions were answered, and informed consent                            was obtained. Prior Anticoagulants: The patient has                            taken Brillinta, last dose was 9 days prior to                            procedure. ASA Grade Assessment: III - A patient  with severe systemic disease. After reviewing the                            risks and benefits, the patient was deemed in                            satisfactory condition to undergo the procedure.                           After obtaining informed consent, the scope was                            passed under direct vision. Throughout the           procedure, the patient's blood pressure, pulse, and                            oxygen saturations were monitored continuously. The                            [id number]) scope was introduced through                            the mouth, and used to inject contrast into and                            used to inject contrast into the bile duct. The                            ERCP was accomplished without difficulty. The                            patient tolerated the procedure well. Scope In: Scope Out: Findings:      A scout film of the abdomen was obtained. Surgical clips, consistent       with a previous cholecystectomy, were seen in the area of the right       upper quadrant of the abdomen. The esophagus was successfully intubated       under direct vision. The scope was advanced to a normal major papilla in       the descending duodenum without detailed examination of the pharynx,       larynx and associated structures, and upper GI tract. There were 2 small       peri-ampullary diverticuli flanking the major papilla. The upper GI       tract was grossly normal. The bile duct was deeply cannulated with the       traction (standard) sphincterotome. Contrast was injected. I personally       interpreted the bile duct images. There was brisk flow of contrast       through the ducts. Image quality was excellent. Contrast extended to the       hepatic ducts. The lower third of the main bile duct contained multiple       stones, the largest of which was 5 mm in diameter. The main bile duct       was diffusely dilated. The largest diameter was  15 mm. 0.035 inch x 260       cm straight Hydra Jagwire passed successfully into the left main hepatic       duct. A 5 mm biliary sphincterotomy was made with a traction (standard)       sphincterotome using ERBE electrocautery. There was no       post-sphincterotomy bleeding. The biliary tree was swept several times       with a 12-15  mm balloon starting at the bifurcation. Multiple black       pigmented stones and sludge were removed. No stones remained, confirmed       on occlusion cholangiogram. Impression:               - The entire main bile duct was dilated.                           - Choledocholithiasis was found. Complete removal                            was accomplished by biliary sphincterotomy and                            balloon extraction.                           - A biliary sphincterotomy was performed.                           - The biliary tree was swept. Recommendation:           - Avoid aspirin and nonsteroidal anti-inflammatory                            medicines for 5 days. Long term management of                            antiplatlet agents for this patient's CAD per                            cardiology.                           - Low fat diet. Procedure Code(s):        --- Professional ---                           [phone], Endoscopic retrograde                            cholangiopancreatography (ERCP); with removal of                            calculi/debris from biliary/pancreatic duct(s)                           43262, Endoscopic retrograde                            cholangiopancreatography (ERCP); with  sphincterotomy/papillotomy Diagnosis Code(s):        --- Professional ---                           K80.50, Calculus of bile duct without cholangitis                            or cholecystitis without obstruction                           K83.8, Other specified diseases of biliary tract CPT copyright 2016 [employer]. All rights reserved. The codes documented in this report are preliminary and upon coder review may  be revised to meet current compliance requirements. [name] L. Loletha Carrow, MD 02/13/2016 2:27:05 PM This report has been signed electronically. Number of Addenda: 0

## 2016-02-13 NOTE — Progress Notes (Signed)
Patient ID: Troy Boyer, male   DOB: 04/09/1951, 65 y.o.   MRN: [id number]  [facility] Surgery Progress Note  1 Day Post-Op  Subjective: States that he is feeling well this morning. Some abdominal pain with ambulation and movement, but overall improving. Having regular daily BM's. Denies n/v.  Less drainage from JP drain over night, only about 80mL bloody drainage since yesterday evening.  Objective: Vital signs in last 24 hours: Temp:  [98.3 F (36.8 C)-98.7 F (37.1 C)] 98.7 F (37.1 C) (08/18 0537) Pulse Rate:  [72-79] 72 (08/18 0537) Resp:  [16-18] 16 (08/18 0537) BP: (136-152)/(75-78) 136/75 (08/18 0537) SpO2:  [95 %-98 %] 95 % (08/18 0537) Last BM Date: 02/12/16  Intake/Output from previous day: 08/17 0701 - 08/18 0700 In: 3142.5 [P.O.:990; I.V.:2002.5; IV Piggyback:150] Out: 3060 [Urine:2700; Drains:360] Intake/Output this shift: Total I/O In: -  Out: 150 [Urine:150]  PE: Gen:  Alert, NAD, pleasant Card:  RRR Pulm:  CTAB Abd: Soft, ND, minimal tender RUQ/RLQ, +BS, incisions C/D/I, drain with minimal sanguinous drainage  Lab Results:   Recent Labs  02/12/16 1854 02/13/16 0455  WBC 9.3 8.3  HGB 9.4* 8.5*  HCT 29.0* 26.9*  PLT 404* 401*   BMET  Recent Labs  02/13/16 0455  NA 138  K 3.2*  CL 103  CO2 26  GLUCOSE 120*  BUN 6  CREATININE 0.73  CALCIUM 8.3*   PT/INR No results for input(s): LABPROT, INR in the last 72 hours. CMP     Component Value Date/Time   NA 138 02/13/2016 0455   K 3.2 (L) 02/13/2016 0455   CL 103 02/13/2016 0455   CO2 26 02/13/2016 0455   GLUCOSE 120 (H) 02/13/2016 0455   BUN 6 02/13/2016 0455   CREATININE 0.73 02/13/2016 0455   CREATININE 0.90 09/18/2014 0927   CALCIUM 8.3 (L) 02/13/2016 0455   PROT 5.7 (L) 02/13/2016 0455   ALBUMIN 2.5 (L) 02/13/2016 0455   AST 38 02/13/2016 0455   ALT 110 (H) 02/13/2016 0455   ALKPHOS 520 (H) 02/13/2016 0455   BILITOT 0.8 02/13/2016 0455   GFRNONAA >60 02/13/2016 0455    GFRNONAA >89 09/18/2014 0927   GFRAA >60 02/13/2016 0455   GFRAA >89 09/18/2014 0927   Lipase     Component Value Date/Time   LIPASE 30 02/07/2016 1050       Studies/Results: No results found.  Anti-infectives: Anti-infectives    Start     Dose/Rate Route Frequency Ordered Stop   02/08/16 2000  piperacillin-tazobactam (ZOSYN) IVPB 3.375 g     3.375 g 12.5 mL/hr over 240 Minutes Intravenous Every 8 hours 02/08/16 1941     02/08/16 1200  cefTRIAXone (ROCEPHIN) 2 g in dextrose 5 % 50 mL IVPB  Status:  Discontinued     2 g 100 mL/hr over 30 Minutes Intravenous Every 24 hours 02/07/16 1540 02/08/16 1932   02/07/16 1200  cefTRIAXone (ROCEPHIN) 2 g in dextrose 5 % 50 mL IVPB     2 g 100 mL/hr over 30 Minutes Intravenous  Once 02/07/16 1147 02/07/16 1255       Assessment/Plan RUQ pain Acute Cholecystitis - s/p lap chole with IOC by Dr. Marcello Moores Cornett (02/09/16) Choledocholithiasis - appreciated on IOC - JP drain in RUQ with significantly less output over night, about 20cc since yesterday evening - hgb stable at 8.5 today - ERCP cancelled yesterday due to risk of bleeding. LFT's improving. Will see if GI is going to reschedule or if they would  follow-up OP and consider at later time. Pending cardiology eval.  HTN HLD CAD - on Brillinta for cardiac stents, day#9 of med being held. Pending cardiology eval to see if this will need to be restarted after discharge or if he can transition to aspirin. DM II  FEN: NPO for ERCP ID: Zosyn DVT Proph: SCD's Dispo - ERCP, cardiology eval, if he does not have ERCP soon could consider discharging this weekend and scheduling f/u with GI OP   LOS: 6 days    Jerrye Beavers , [facility] Surgery 02/13/2016, 8:33 AM Pager: [phone] Consults: [phone] Mon-Fri 7:00 am-4:30 pm Sat-Sun 7:00 am-11:30 am

## 2016-02-13 NOTE — Progress Notes (Signed)
PROGRESS NOTE  Abraham Bitzer [id number] DOB: July 09, 1950 DOA: 02/07/2016 PCP: [name], FNP  Brief Narrative: 65 year old man with diabetes, coronary artery disease, presented with abdominal pain, nausea, vomiting. Admitted for acute cholecystitis. Seen by cardiology, cleared for surgery.  HPI/Subjective: Feeling much better today, denies any complaints, the JP drain has less bloody drainage yesterday. Scheduled for ERCP at 1 PM today.  Assessment/Plan:  Active Problems:   Hypertension   Hyperlipidemia   CAD (coronary artery disease)   Non-insulin dependent type 2 diabetes mellitus (HCC)   Chest pain   Acute cholecystitis   RUQ abdominal pain   Cholecystitis   Choledocholithiasis with acute cholecystitis   LFT elevation   Acute cholecystitis:  -Stable, status post surgery 8/14. Continue empiric antibiotics. Further post operative care per [facility]. -JP drain in place, per general surgery had increased bleeding secondary to Brilinta. -Still on Zosyn, likely he will not need antibiotics on discharge.  CAD s/p stent 2016:  -Appears stable. Per cardiology continue statins, B-blocker and ACEI. -Seen by Dr. [name] on the 8/12 for cardiac clearance, he had recent non-STEMI on 01/27/16. -Initial recommendation to resume resume Brilinta when ok with surgery/GI. -I discussed with Dr. Radford Pax of cardiology and per surgery/GI request seems a's okay to hold Brilinta for extra 1-2 weeks. -The risk of bleeding with Brilinta, outweighs the risk of major cardiac events can be prevented by Brilinta for now.  Acute blood loss anemia -Hemoglobin baseline is 12.3 on admission, hemoglobin dropped down to 7.0, S/P transfusion of 2 units of packed RBC. -Hb stable at 8.5, less bloody drainage through the JP drain.  Choledocholithiasis:  -Seen by GI, ERCP planned for today at 1 PM  DM type 2:  remains stable and with fairly well controlled CBG's. Will continue SSI  Essential HTN:  will  continue current antihypertensive regimen. BP is stable.   DVT prophylaxis: SCDs Code Status: full Family Communication: none Disposition Plan: home  Consultants:  Surgery  Cardiology   GI (Dr. Ardis Hughs)  Procedures:  LAPAROSCOPIC CHOLECYSTECTOMY WITH INTRAOPERATIVE CHOLANGIOGRAM (N/A)  ERCP: planned for 8/17 tentatively   Antimicrobials:  Zosyn 8/13 >>   Objective: Vitals:   02/12/16 1327 02/12/16 2153 02/13/16 0537 02/13/16 1240  BP:  (!) 148/76 136/75 (!) 174/70  Pulse: 78 79 72 73  Resp: 18 18 16  (!) 22  Temp: 98.3 F (36.8 C) 98.4 F (36.9 C) 98.7 F (37.1 C) 98.5 F (36.9 C)  TempSrc: Oral Oral Oral Oral  SpO2:  98% 95% 100%  Weight:    73.5 kg (162 lb)  Height:    5\' 4"  (1.626 m)    Intake/Output Summary (Last 24 hours) at 02/13/16 1252 Last data filed at 02/13/16 1112  Gross per 24 hour  Intake           2627.5 ml  Output             2930 ml  Net           -302.5 ml     Filed Weights   02/07/16 0944 02/07/16 1426 02/13/16 1240  Weight: 73.5 kg (162 lb) 73.5 kg (162 lb) 73.5 kg (162 lb)    Exam: Constitutional:  . Appears calm and comfortable; reports pain to be mild. No nausea or vomiting. Denies  CP and SOB. Patient is afebrile  Respiratory:  . CTA bilaterally, no w/r/r.  . Respiratory effort normal. No retractions or accessory muscle use Cardiovascular:  . RRR, no m/r/g . No LE extremity edema  Abdomen  Soft, mild tenderness on palpation, no guarding, positive BS  Holes from laparoscopic procedure stable  JP drain with red output  Psychiatric:  . judgement and insight appear normal . Mental status o Mood, affect appropriate  I have personally reviewed following labs and imaging studies:  Blood sugars stable  CMET with normal electrolytes and renal function. LFT's still elevated, but trending down.   Last CBC on 8/14 demonstrated resolution of leukocytosis and stable Hgb  Scheduled Meds: . [MAR Hold] amLODipine  5 mg Oral  Daily  . [MAR Hold] antiseptic oral rinse  7 mL Mouth Rinse BID  . [MAR Hold] atorvastatin  80 mg Oral Daily  . [MAR Hold] indomethacin  100 mg Rectal Once  . [MAR Hold] insulin aspart  0-9 Units Subcutaneous TID WC  . [MAR Hold] metoprolol tartrate  25 mg Oral Daily  . [MAR Hold] piperacillin-tazobactam (ZOSYN)  IV  3.375 g Intravenous Q8H  . [MAR Hold] ramipril  10 mg Oral BID  . [MAR Hold] sodium chloride flush  3 mL Intravenous Q12H   Continuous Infusions: . sodium chloride 75 mL/hr at 02/13/16 0725    Active Problems:   Hypertension   Hyperlipidemia   CAD (coronary artery disease)   Non-insulin dependent type 2 diabetes mellitus (HCC)   Chest pain   Acute cholecystitis   RUQ abdominal pain   Cholecystitis   Choledocholithiasis with acute cholecystitis   LFT elevation  Taylin Mans A [phone]   LOS: 6 days   Time spent 25 minutes

## 2016-02-13 NOTE — Transfer of Care (Signed)
Immediate Anesthesia Transfer of Care Note  Patient: Troy Boyer  Procedure(s) Performed: Procedure(s): ENDOSCOPIC RETROGRADE CHOLANGIOPANCREATOGRAPHY (ERCP) (N/A)  Patient Location: Endoscopy Unit  Anesthesia Type:General  Level of Consciousness: awake and alert   Airway & Oxygen Therapy: Patient Spontanous Breathing and Patient connected to nasal cannula oxygen  Post-op Assessment: Report given to RN, Post -op Vital signs reviewed and stable and Patient moving all extremities  Post vital signs: Reviewed and stable  Last Vitals:  Vitals:   02/13/16 1240 02/13/16 1417  BP: (!) 174/70 134/74  Pulse: 73 78  Resp: (!) 22 17  Temp: 36.9 C 36.6 C    Last Pain:  Vitals:   02/13/16 1417  TempSrc: Oral  PainSc:       Patients Stated Pain Goal: 1 ([id number])  Complications: No apparent anesthesia complications

## 2016-02-13 NOTE — H&P (View-Only) (Signed)
Surgical note and I/O noted.  AM labs reviewed.  ERCP today about 1pm.  Patient notified.

## 2016-02-14 LAB — CBC
HCT: 27.5 % — ABNORMAL LOW (ref 39.0–52.0)
Hemoglobin: 8.7 g/dL — ABNORMAL LOW (ref 13.0–17.0)
MCH: 29 pg (ref 26.0–34.0)
MCHC: 31.6 g/dL (ref 30.0–36.0)
MCV: 91.7 fL (ref 78.0–100.0)
PLATELETS: 447 10*3/uL — AB (ref 150–400)
RBC: 3 MIL/uL — AB (ref 4.22–5.81)
RDW: 16.5 % — ABNORMAL HIGH (ref 11.5–15.5)
WBC: 9.5 10*3/uL (ref 4.0–10.5)

## 2016-02-14 LAB — COMPREHENSIVE METABOLIC PANEL
ALBUMIN: 2.4 g/dL — AB (ref 3.5–5.0)
ALT: 84 U/L — AB (ref 17–63)
AST: 31 U/L (ref 15–41)
Alkaline Phosphatase: 586 U/L — ABNORMAL HIGH (ref 38–126)
Anion gap: 7 (ref 5–15)
BUN: 9 mg/dL (ref 6–20)
CHLORIDE: 104 mmol/L (ref 101–111)
CO2: 27 mmol/L (ref 22–32)
CREATININE: 0.83 mg/dL (ref 0.61–1.24)
Calcium: 8.5 mg/dL — ABNORMAL LOW (ref 8.9–10.3)
GFR calc Af Amer: >60 mL/min (ref >60)
GFR calc non Af Amer: >60 mL/min (ref >60)
GLUCOSE: 138 mg/dL — AB (ref 65–99)
Potassium: 4.7 mmol/L (ref 3.5–5.1)
SODIUM: 138 mmol/L (ref 135–145)
Total Bilirubin: 0.7 mg/dL (ref 0.3–1.2)
Total Protein: 5.8 g/dL — ABNORMAL LOW (ref 6.5–8.1)

## 2016-02-14 LAB — GLUCOSE, CAPILLARY: GLUCOSE-CAPILLARY: 153 mg/dL — AB (ref 65–99)

## 2016-02-14 MED ORDER — HYDROCODONE-ACETAMINOPHEN 5-325 MG PO TABS: 1.0000 | ORAL_TABLET | Freq: Four times a day (QID) | ORAL | 0 refills | Status: DC | PRN

## 2016-02-14 NOTE — Anesthesia Postprocedure Evaluation (Signed)
Anesthesia Post Note  Patient: [name]  Procedure(s) Performed: Procedure(s) (LRB): ENDOSCOPIC RETROGRADE CHOLANGIOPANCREATOGRAPHY (ERCP) (N/A)  Patient location during evaluation: PACU Anesthesia Type: General Level of consciousness: awake and alert Pain management: pain level controlled Vital Signs Assessment: post-procedure vital signs reviewed and stable Respiratory status: spontaneous breathing, nonlabored ventilation, respiratory function stable and patient connected to nasal cannula oxygen Cardiovascular status: blood pressure returned to baseline and stable Postop Assessment: no signs of nausea or vomiting Anesthetic complications: no     Last Vitals:  Vitals:   02/13/16 2139 02/14/16 0544  BP: 120/63 126/72  Pulse: 83 66  Resp:    Temp: 37 C 37.1 C    Last Pain:  Vitals:   02/14/16 0610  TempSrc:   PainSc: (P) 6    Pain Goal: Patients Stated Pain Goal: 1 (02/11/16 2100)               Zenaida Deed

## 2016-02-14 NOTE — Progress Notes (Signed)
Patient ID: Troy Boyer, male   DOB: 02/03/1951, 65 y.o.   MRN: [id number] [facility] Surgery Progress Note:   1 Day Post-Op  Subjective: Mental status is clear.  Wants to go home Objective: Vital signs in last 24 hours: Temp:  [97.9 F (36.6 C)-98.7 F (37.1 C)] 98.7 F (37.1 C) (08/19 0544) Pulse Rate:  [66-83] 66 (08/19 0544) Resp:  [14-22] 14 (08/18 1440) BP: (120-174)/(63-80) 126/72 (08/19 0544) SpO2:  [93 %-100 %] 97 % (08/19 0544) Weight:  [73.5 kg (162 lb)] 73.5 kg (162 lb) (08/18 1240)  Intake/Output from previous day: 08/18 0701 - 08/19 0700 In: 540 [P.O.:240; I.V.:250; IV Piggyback:50] Out: 4193 [Urine:1725; Drains:110; Blood:5] Intake/Output this shift: No intake/output data recorded.  Physical Exam: Work of breathing is normal.  Up walking about without pain.  JP with serosanguinous discharge.    Lab Results:  Results for orders placed or performed during the hospital encounter of 02/07/16 (from the past 48 hour(s))  CBC     Status: Abnormal   Collection Time: 02/12/16  9:41 AM  Result Value Ref Range   WBC 9.0 4.0 - 10.5 K/uL   RBC 2.98 (L) 4.22 - 5.81 MIL/uL   Hemoglobin 8.6 (L) 13.0 - 17.0 g/dL   HCT 26.9 (L) 39.0 - 52.0 %   MCV 90.3 78.0 - 100.0 fL   MCH 28.9 26.0 - 34.0 pg   MCHC 32.0 30.0 - 36.0 g/dL   RDW 17.2 (H) 11.5 - 15.5 %   Platelets 347 150 - 400 K/uL  Glucose, capillary     Status: Abnormal   Collection Time: 02/12/16 11:15 AM  Result Value Ref Range   Glucose-Capillary 111 (H) 65 - 99 mg/dL  Glucose, capillary     Status: Abnormal   Collection Time: 02/12/16  5:43 PM  Result Value Ref Range   Glucose-Capillary 180 (H) 65 - 99 mg/dL  CBC     Status: Abnormal   Collection Time: 02/12/16  6:54 PM  Result Value Ref Range   WBC 9.3 4.0 - 10.5 K/uL   RBC 3.21 (L) 4.22 - 5.81 MIL/uL   Hemoglobin 9.4 (L) 13.0 - 17.0 g/dL   HCT 29.0 (L) 39.0 - 52.0 %   MCV 90.3 78.0 - 100.0 fL   MCH 29.3 26.0 - 34.0 pg   MCHC 32.4 30.0 - 36.0 g/dL   RDW 16.9 (H) 11.5 - 15.5 %   Platelets 404 (H) 150 - 400 K/uL  Glucose, capillary     Status: Abnormal   Collection Time: 02/12/16  9:56 PM  Result Value Ref Range   Glucose-Capillary 160 (H) 65 - 99 mg/dL  Comprehensive metabolic panel     Status: Abnormal   Collection Time: 02/13/16  4:55 AM  Result Value Ref Range   Sodium 138 135 - 145 mmol/L   Potassium 3.2 (L) 3.5 - 5.1 mmol/L   Chloride 103 101 - 111 mmol/L   CO2 26 22 - 32 mmol/L   Glucose, Bld 120 (H) 65 - 99 mg/dL   BUN 6 6 - 20 mg/dL   Creatinine, Ser 0.73 0.61 - 1.24 mg/dL   Calcium 8.3 (L) 8.9 - 10.3 mg/dL   Total Protein 5.7 (L) 6.5 - 8.1 g/dL   Albumin 2.5 (L) 3.5 - 5.0 g/dL   AST 38 15 - 41 U/L   ALT 110 (H) 17 - 63 U/L   Alkaline Phosphatase 520 (H) 38 - 126 U/L   Total Bilirubin 0.8 0.3 - 1.2  mg/dL   GFR calc non Af Amer >60 >60 mL/min   GFR calc Af Amer >60 >60 mL/min    Comment: (NOTE) The eGFR has been calculated using the CKD EPI equation. This calculation has not been validated in all clinical situations. eGFR's persistently <60 mL/min signify possible Chronic Kidney Disease.    Anion gap 9 5 - 15  CBC     Status: Abnormal   Collection Time: 02/13/16  4:55 AM  Result Value Ref Range   WBC 8.3 4.0 - 10.5 K/uL   RBC 2.97 (L) 4.22 - 5.81 MIL/uL   Hemoglobin 8.5 (L) 13.0 - 17.0 g/dL   HCT 26.9 (L) 39.0 - 52.0 %   MCV 90.6 78.0 - 100.0 fL   MCH 28.6 26.0 - 34.0 pg   MCHC 31.6 30.0 - 36.0 g/dL   RDW 16.9 (H) 11.5 - 15.5 %   Platelets 401 (H) 150 - 400 K/uL  Glucose, capillary     Status: Abnormal   Collection Time: 02/13/16  7:23 AM  Result Value Ref Range   Glucose-Capillary 135 (H) 65 - 99 mg/dL  Glucose, capillary     Status: Abnormal   Collection Time: 02/13/16 11:09 AM  Result Value Ref Range   Glucose-Capillary 127 (H) 65 - 99 mg/dL  Glucose, capillary     Status: Abnormal   Collection Time: 02/13/16  5:21 PM  Result Value Ref Range   Glucose-Capillary 203 (H) 65 - 99 mg/dL  Glucose,  capillary     Status: Abnormal   Collection Time: 02/13/16 10:08 PM  Result Value Ref Range   Glucose-Capillary 221 (H) 65 - 99 mg/dL  CBC     Status: Abnormal   Collection Time: 02/14/16  3:27 AM  Result Value Ref Range   WBC 9.5 4.0 - 10.5 K/uL   RBC 3.00 (L) 4.22 - 5.81 MIL/uL   Hemoglobin 8.7 (L) 13.0 - 17.0 g/dL   HCT 27.5 (L) 39.0 - 52.0 %   MCV 91.7 78.0 - 100.0 fL   MCH 29.0 26.0 - 34.0 pg   MCHC 31.6 30.0 - 36.0 g/dL   RDW 16.5 (H) 11.5 - 15.5 %   Platelets 447 (H) 150 - 400 K/uL  Comprehensive metabolic panel     Status: Abnormal   Collection Time: 02/14/16  3:27 AM  Result Value Ref Range   Sodium 138 135 - 145 mmol/L   Potassium 4.7 3.5 - 5.1 mmol/L    Comment: DELTA CHECK NOTED   Chloride 104 101 - 111 mmol/L   CO2 27 22 - 32 mmol/L   Glucose, Bld 138 (H) 65 - 99 mg/dL   BUN 9 6 - 20 mg/dL   Creatinine, Ser 0.83 0.61 - 1.24 mg/dL   Calcium 8.5 (L) 8.9 - 10.3 mg/dL   Total Protein 5.8 (L) 6.5 - 8.1 g/dL   Albumin 2.4 (L) 3.5 - 5.0 g/dL   AST 31 15 - 41 U/L   ALT 84 (H) 17 - 63 U/L   Alkaline Phosphatase 586 (H) 38 - 126 U/L   Total Bilirubin 0.7 0.3 - 1.2 mg/dL   GFR calc non Af Amer >60 >60 mL/min   GFR calc Af Amer >60 >60 mL/min    Comment: (NOTE) The eGFR has been calculated using the CKD EPI equation. This calculation has not been validated in all clinical situations. eGFR's persistently <60 mL/min signify possible Chronic Kidney Disease.    Anion gap 7 5 - 15  Glucose, capillary  Status: Abnormal   Collection Time: 02/14/16  8:03 AM  Result Value Ref Range   Glucose-Capillary 153 (H) 65 - 99 mg/dL    Radiology/Results: Dg Ercp Biliary & Pancreatic Ducts  Result Date: 02/13/2016 CLINICAL DATA:  65 year old male with a history of common bile duct calculus EXAM: ERCP TECHNIQUE: Multiple spot images obtained with the fluoroscopic device and submitted for interpretation post-procedure. FLUOROSCOPY TIME:  4 minutes 37 seconds COMPARISON:   Intraoperative cholangiogram 02/09/2016 FINDINGS: Limited intraoperative fluoroscopic spot images during ERCP. Initial images demonstrates endoscope projecting over the upper abdomen with cannulation of the ampulla and retrograde infusion of contrast. Surgical changes of cholecystectomy. Incomplete opacification of the extrahepatic biliary system. Balloon basket present on several images, including the final image. IMPRESSION: Limited fluoroscopic images during ERCP demonstrates stone retrieval with balloon basket and surgical changes of cholecystectomy. Please refer to the dictated operative report for full details of intraoperative findings and procedure. Signed, Dulcy Fanny. Earleen Newport, DO [facility] Rad[facility] Electronically Signed   By: Corrie Mckusick D.O.   On: 02/13/2016 14:34    Anti-infectives: Anti-infectives    Start     Dose/Rate Route Frequency Ordered Stop   02/08/16 2000  piperacillin-tazobactam (ZOSYN) IVPB 3.375 g     3.375 g 12.5 mL/hr over 240 Minutes Intravenous Every 8 hours 02/08/16 1941     02/08/16 1200  cefTRIAXone (ROCEPHIN) 2 g in dextrose 5 % 50 mL IVPB  Status:  Discontinued     2 g 100 mL/hr over 30 Minutes Intravenous Every 24 hours 02/07/16 1540 02/08/16 1932   02/07/16 1200  cefTRIAXone (ROCEPHIN) 2 g in dextrose 5 % 50 mL IVPB     2 g 100 mL/hr over 30 Minutes Intravenous  Once 02/07/16 1147 02/07/16 1255      Assessment/Plan: Problem List: Patient Active Problem List   Diagnosis Date Noted  . Choledocholithiasis with acute cholecystitis   . LFT elevation   . Acute cholecystitis 02/07/2016  . RUQ abdominal pain 02/07/2016  . Cholecystitis 02/07/2016  . Gastroenteritis, acute 02/06/2016  . Unstable angina ([place]) 01/27/2016  . Precordial chest pain 01/27/2016  . Chest pain 07/30/2015  . NSTEMI (non-ST elevated myocardial infarction) ([place]) 01/19/2015  . Non-insulin dependent type 2 diabetes mellitus ([place]) 10/21/2014   . Tobacco abuse 10/17/2013  . Hypertension   . Hyperlipidemia   . CAD (coronary artery disease)     Hg stable today.  OK to discharge with JP in place and on ASA (check with cardiologist about other meds).  Follow up with Dr. Brantley Stage next week for JP removal.   1 Day Post-Op    LOS: 7 days   Matt B. Hassell Done, MD, [facility] [phone] beeper [phone]  02/14/2016 8:46 AM

## 2016-02-14 NOTE — Progress Notes (Signed)
Pt ready for discharge.  Wife at bedside and pt/wife were shown how to care for the JP drain and keep a record.  Copy of DC instructions given and explained.  Rx given and explained.  Pt understands follow up appts.

## 2016-02-14 NOTE — Discharge Summary (Signed)
Physician Discharge Summary  Kyree Perren [id number] DOB: Oct 24, 1950 DOA: 02/07/2016  PCP: [name], FNP  Admit date: 02/07/2016 Discharge date: 02/14/2016  Admitted From: Home Disposition: Home  Recommendations for Outpatient Follow-up:  1. Follow up with 1 week for JP drain evaluation, follow-up with Dr. Stanford Breed in 02/25/16 2. Please obtain BMP/CBC in one week  Home Health: Yes Equipment/Devices:   Discharge Condition: Stable CODE STATUS: Full code Diet recommendation: Heart Healthy  Brief/Interim Summary: Rimas Bertot is a 65 y.o. male with medical history significant for HTN, HLD, DM CADstatus post stent in 2016, on Brillinta, presenting to the ED with  Postprandial RUQ abdominal pain, cramping, present over the last 4 days. Initially he reported nausea and vomiting with an episode of diarrhea, evaluated by his PCP on 811, receiving IN Toradol and IV and Zofran with initial relief, then returning as generalized cramping, back pain. Nausea and vomiting still present. No dysuria or hematuria. No fevers.Surgical evaluation is being obtained, suspecting that the patient may need a cystectomy. Consult is in progress. Of note, the patient reports burning chest pain as well, 2 out of 10, feeling somewhat like his prior anginal pain. EKG is unremarkable, troponin negative.however, prior to his surgery, Dr. Lucia Gaskins requests a cardiac clearance, and breathing intact to be placed on hold. Cardiology consult is pending  Discharge Diagnoses:  Active Problems:   Hypertension   Hyperlipidemia   CAD (coronary artery disease)   Non-insulin dependent type 2 diabetes mellitus (HCC)   Chest pain   Acute cholecystitis   RUQ abdominal pain   Cholecystitis   Choledocholithiasis with acute cholecystitis   LFT elevation   Acute cholecystitis:  -Stable, status post surgery 8/14. Treated with empiric antibiotics for 1 week.  -JP drain in place, did have more than usual draining secondary  to Brilinta. -Discharged with the JP drain, follow-up with general surgery as outpatient for drain removal. -Prescribed 20 pills of Vicodin 5/325.  CAD s/p stent 2016 and recent non-STEMI on 01/27/16 -Seen by Dr. [name] on the 8/12 for cardiac clearance, he had recent non-STEMI on 01/27/16. -Initial recommendation to resume resume Brilinta when ok with surgery/GI. -I discussed with Dr. Radford Pax of cardiology and per surgery/GI request seems a's okay to hold Brilinta for extra 1-2 weeks. -The risk of bleeding with Brilinta, outweighs the risk of major cardiac events can be prevented by Brilinta for now. -Discharged home on low-dose aspirin, hold Brilinta until he sees Dr. Stanford Breed on August 30.  Acute blood loss anemia -Hemoglobin baseline is 12.3 on admission, hemoglobin dropped down to 7.0, S/P transfusion of 2 units of packed RBC. -Hb stable at 8.5, less bloody drainage through the JP drain.  Choledocholithiasis:  -Seen by GI, ERCP planned for today at 1 PM  DM type 2:  remains stable and with fairly well controlled CBG's. Will continue SSI  Essential HTN:  will continue current antihypertensive regimen. BP is stable.   Discharge Instructions  Discharge Instructions    Diet - low sodium heart healthy    Complete by:  As directed   Increase activity slowly    Complete by:  As directed       Medication List    STOP taking these medications   ticagrelor 90 MG Tabs tablet Commonly known as:  BRILINTA     TAKE these medications   acetaminophen 500 MG tablet Commonly known as:  TYLENOL Take 1,000 mg by mouth every 6 (six) hours as needed for headache (pain).   amLODipine 5  MG tablet Commonly known as:  NORVASC TAKE 1 TABLET BY MOUTH DAILY   aspirin EC 81 MG tablet Take 81 mg by mouth daily.   atorvastatin 80 MG tablet Commonly known as:  LIPITOR Take 1 tablet (80 mg total) by mouth daily. What changed:  when to take this   diphenoxylate-atropine 2.5-0.025 MG  tablet Commonly known as:  LOMOTIL Take 1 tablet by mouth 4 (four) times daily as needed for diarrhea or loose stools.   HYDROcodone-acetaminophen 5-325 MG tablet Commonly known as:  NORCO/VICODIN Take 1-2 tablets by mouth every 6 (six) hours as needed for moderate pain.   Krill Oil 500 MG Caps Take 500 mg by mouth 2 (two) times daily.   metoprolol tartrate 25 MG tablet Commonly known as:  LOPRESSOR TAKE 1 TABLET BY MOUTH DAILY What changed:  See the new instructions.   multivitamin with minerals Tabs tablet Take 1 tablet by mouth daily.   NEXIUM PO Take 22.3 mg by mouth 2 (two) times daily.   nitroGLYCERIN 0.4 MG SL tablet Commonly known as:  NITROSTAT Place 1 tablet (0.4 mg total) under the tongue every 5 (five) minutes as needed for chest pain.   ondansetron 4 MG tablet Commonly known as:  ZOFRAN Take 1 tablet (4 mg total) by mouth every 8 (eight) hours as needed for nausea or vomiting.   ramipril 10 MG capsule Commonly known as:  ALTACE TAKE ONE CAPSULE BY MOUTH TWICE DAILY   sildenafil 100 MG tablet Commonly known as:  VIAGRA Take 1 tablet (100 mg total) by mouth daily as needed for erectile dysfunction. What changed:  how much to take   zolpidem 5 MG tablet Commonly known as:  AMBIEN TAKE 1 TABLET BY MOUTH AT BEDTIME AS NEEDED FOR SLEEP      Follow-up Information    CORNETT,THOMAS A., MD. Schedule an appointment as soon as possible for a visit in 5 day(s).   Specialty:  General Surgery Contact information: [address] [place] [address] [phone]          No Known Allergies  Consultations:  Cardiology.  GI  Gen. surgery.   Procedures/Studies: X-ray Chest Pa And Lateral  Result Date: 02/08/2016 CLINICAL DATA:  Preop examination.  Patient for a cholecystectomy. EXAM: CHEST  2 VIEW COMPARISON:  01/26/2016 FINDINGS: Cardiac silhouette is normal in size. Normal mediastinal and hilar contours. Prominent lung markings noted  mostly in the bases. No evidence of pneumonia or pulmonary edema. No pleural effusion or pneumothorax. Skeletal structures are demineralized but intact. IMPRESSION: No active cardiopulmonary disease. Electronically Signed   By: Lajean Manes M.D.   On: 02/08/2016 07:43   Dg Chest 2 View  Result Date: 01/26/2016 CLINICAL DATA:  65 year old male with chest pain EXAM: CHEST  2 VIEW COMPARISON:  Chest radiograph dated 01/19/2015 FINDINGS: There is no focal consolidation, pleural effusion, or pneumothorax. The cardiac silhouette is within normal limits with no acute osseous pathology. IMPRESSION: No active cardiopulmonary disease. Electronically Signed   By: Anner Crete M.D.   On: 01/26/2016 23:12   Dg Cholangiogram Operative  Result Date: 02/09/2016 CLINICAL DATA:  Acute calculus cholecystitis EXAM: INTRAOPERATIVE CHOLANGIOGRAM TECHNIQUE: Cholangiographic images from the C-arm fluoroscopic device were submitted for interpretation post-operatively. Please see the procedural report for the amount of contrast and the fluoroscopy time utilized. COMPARISON:  Prior abdominal ultrasound 02/07/2016 FINDINGS: Images from an intraoperative cholangiogram obtained at the time of laparoscopic cholecystectomy demonstrate cannulation of the cystic duct remanent and  opacification of the biliary tree. There is mild intrahepatic biliary ductal dilatation and moderate dilatation of the common bile duct. Irregular filling defects within the distal common bile duct may represent sludge or small stones. The ampulla is obstructed. There is extravasation of contrast material during the injection. IMPRESSION: 1. Biliary ductal dilatation with irregular filling defects in the distal common bile duct concerning for sludge and/or small stones. 2. Intra and extrahepatic biliary ductal dilatation. Electronically Signed   By: Jacqulynn Cadet M.D.   On: 02/09/2016 13:28   Ct Head Wo Contrast  Result Date: 01/27/2016 CLINICAL DATA:   Headache. History of hypertension, hyperlipidemia, diabetes. EXAM: CT HEAD WITHOUT CONTRAST TECHNIQUE: Contiguous axial images were obtained from the base of the skull through the vertex without intravenous contrast. COMPARISON:  None. FINDINGS: INTRACRANIAL CONTENTS: Moderate ventriculomegaly, with disproportionate sulcal effacement at the convexities. No interstitial/ transependymal edema. No intraparenchymal hemorrhage, mass effect, midline shift or acute large vascular territory infarcts. No abnormal extra-axial fluid collections. Basal cisterns are patent. Mild calcific atherosclerosis of the carotid siphons. ORBITS: The included ocular globes and orbital contents are normal. SINUSES: The mastoid aircells and included paranasal sinuses are well-aerated. SKULL/SOFT TISSUES: No skull fracture. No significant soft tissue swelling. IMPRESSION: Moderate chronic appearing hydrocephalus, most compatible with normal pressure hydrocephalus. Electronically Signed   By: Elon Alas M.D.   On: 01/27/2016 23:37   Dg Ercp Biliary & Pancreatic Ducts  Result Date: 02/13/2016 CLINICAL DATA:  65 year old male with a history of common bile duct calculus EXAM: ERCP TECHNIQUE: Multiple spot images obtained with the fluoroscopic device and submitted for interpretation post-procedure. FLUOROSCOPY TIME:  4 minutes 37 seconds COMPARISON:  Intraoperative cholangiogram 02/09/2016 FINDINGS: Limited intraoperative fluoroscopic spot images during ERCP. Initial images demonstrates endoscope projecting over the upper abdomen with cannulation of the ampulla and retrograde infusion of contrast. Surgical changes of cholecystectomy. Incomplete opacification of the extrahepatic biliary system. Balloon basket present on several images, including the final image. IMPRESSION: Limited fluoroscopic images during ERCP demonstrates stone retrieval with balloon basket and surgical changes of cholecystectomy. Please refer to the dictated  operative report for full details of intraoperative findings and procedure. Signed, Dulcy Fanny. Earleen Newport, DO [facility] Electronically Signed   By: Corrie Mckusick D.O.   On: 02/13/2016 14:34   US Abdomen Limited Ruq  Result Date: 02/07/2016 CLINICAL DATA:  Intermittent right upper quadrant and mid abdominal pain for 3 days with nausea, vomiting and diarrhea. EXAM: US ABDOMEN LIMITED - RIGHT UPPER QUADRANT COMPARISON:  None. FINDINGS: Gallbladder: Mildly distended gallbladder is completely filled with mixed echogenicity material, likely a combination of sludge and gallstones, with the suggestion of a 1.4 cm gallstone in the gallbladder neck. Mild diffuse gallbladder wall thickening. No pericholecystic fluid. Sonographic Percell Miller sign is present. Common bile duct: Diameter: 13 mm. No choledocholithiasis demonstrated in the visualized proximal common bile duct with nonvisualization of the lower common bile duct due to overlying bowel gas. Liver: No focal lesion identified. Within normal limits in parenchymal echogenicity. Incidental simple 2.8 x 2.5 x 2.0 cm renal cyst in the interpolar right kidney. IMPRESSION: 1. Mildly distended gallbladder completely filled with sludge and gallstones with 1.4 cm gallstone in the gallbladder neck. Mild diffuse gallbladder wall thickening with a sonographic Murphy sign present per the technologist. Sonographic findings are consistent with acute cholecystitis in the correct clinical setting. 2. Dilated common bile duct, 13 mm diameter. Lower common bile duct not visualized due to overlying bowel gas. Obstructing  choledocholithiasis not excluded. Correlate with serum bilirubin levels. Consider correlation with MRI abdomen/MRCP without and with IV contrast. 3. Normal liver. Electronically Signed   By: Ilona Sorrel M.D.   On: 02/07/2016 11:04    (Echo, Carotid, EGD, Colonoscopy, ERCP)    Subjective:   Discharge  Exam: Vitals:   02/13/16 2139 02/14/16 0544  BP: 120/63 126/72  Pulse: 83 66  Resp:    Temp: 98.6 F (37 C) 98.7 F (37.1 C)   Vitals:   02/13/16 1430 02/13/16 1440 02/13/16 2139 02/14/16 0544  BP: (!) 145/77 (!) 144/79 120/63 126/72  Pulse: 72 74 83 66  Resp: 19 14    Temp:   98.6 F (37 C) 98.7 F (37.1 C)  TempSrc:   Oral Oral  SpO2: 100% 93% 96% 97%  Weight:      Height:        General: Pt is alert, awake, not in acute distress Cardiovascular: RRR, S1/S2 +, no rubs, no gallops Respiratory: CTA bilaterally, no wheezing, no rhonchi Abdominal: Soft, NT, ND, bowel sounds + Extremities: no edema, no cyanosis    The results of significant diagnostics from this hospitalization (including imaging, microbiology, ancillary and laboratory) are listed below for reference.     Microbiology: Recent Results (from the past 240 hour(s))  Surgical pcr screen     Status: None   Collection Time: 02/08/16  8:12 PM  Result Value Ref Range Status   MRSA, PCR NEGATIVE NEGATIVE Final   Staphylococcus aureus NEGATIVE NEGATIVE Final    Comment:        The Xpert SA Assay (FDA approved for NASAL specimens in patients over 47 years of age), is one component of a comprehensive surveillance program.  Test performance has been validated by [facility] for patients greater than or equal to 93 year old. It is not intended to diagnose infection nor to guide or monitor treatment.      Labs: BNP (last 3 results) No results for input(s): BNP in the last 8760 hours. Basic Metabolic Panel:  Recent Labs Lab 02/08/16 0238 02/09/16 0316 02/10/16 0459 02/13/16 0455 02/14/16 0327  NA 136 138 137 138 138  K 3.7 3.5 4.0 3.2* 4.7  CL 100* 104 105 103 104  CO2 25 24 25 26 27   GLUCOSE 126* 103* 168* 120* 138*  BUN 8 7 12 6 9   CREATININE 0.83 0.89 1.01 0.73 0.83  CALCIUM 8.3* 8.3* 7.7* 8.3* 8.5*   Liver Function Tests:  Recent Labs Lab 02/08/16 0238 02/09/16 0316 02/10/16 0459  02/13/16 0455 02/14/16 0327  AST 33 509* 237* 38 31  ALT 113* 450* 372* 110* 84*  ALKPHOS 320* 767* 659* 520* 586*  BILITOT 0.8 2.3* 1.7* 0.8 0.7  PROT 5.9* 5.9* 4.7* 5.7* 5.8*  ALBUMIN 3.0* 2.9* 2.2* 2.5* 2.4*    Recent Labs Lab 02/07/16 1050  LIPASE 30   No results for input(s): AMMONIA in the last 168 hours. CBC:  Recent Labs Lab 02/07/16 1050  02/09/16 0316  02/11/16 2201 02/12/16 0941 02/12/16 1854 02/13/16 0455 02/14/16 0327  WBC 14.7*  < > 9.1  < > 10.7* 9.0 9.3 8.3 9.5  NEUTROABS 12.1*  --  6.1  --   --   --   --   --   --   HGB 15.4  < > 12.3*  < > 9.2* 8.6* 9.4* 8.5* 8.7*  HCT 45.4  < > 38.6*  < > 28.3* 26.9* 29.0* 26.9* 27.5*  MCV 92.3  < >  96.0  < > 89.3 90.3 90.3 90.6 91.7  PLT 368  < > 304  < > 357 347 404* 401* 447*  < > = values in this interval not displayed. Cardiac Enzymes:  Recent Labs Lab 02/07/16 1050  TROPONINI <0.03   BNP: Invalid input(s): POCBNP CBG:  Recent Labs Lab 02/13/16 0723 02/13/16 1109 02/13/16 1721 02/13/16 2208 02/14/16 0803  GLUCAP 135* 127* 203* 221* 153*   D-Dimer No results for input(s): DDIMER in the last 72 hours. Hgb A1c No results for input(s): HGBA1C in the last 72 hours. Lipid Profile No results for input(s): CHOL, HDL, LDLCALC, TRIG, CHOLHDL, LDLDIRECT in the last 72 hours. Thyroid function studies No results for input(s): TSH, T4TOTAL, T3FREE, THYROIDAB in the last 72 hours.  Invalid input(s): FREET3 Anemia work up No results for input(s): VITAMINB12, FOLATE, FERRITIN, TIBC, IRON, RETICCTPCT in the last 72 hours. Urinalysis No results found for: COLORURINE, APPEARANCEUR, [place], [place], [place], [place], [place], [place], PROTEINUR, UROBILINOGEN, NITRITE, LEUKOCYTESUR Sepsis Labs Invalid input(s): PROCALCITONIN,  WBC,  LACTICIDVEN Microbiology Recent Results (from the past 240 hour(s))  Surgical pcr screen     Status: None   Collection Time: 02/08/16  8:12 PM  Result Value Ref Range  Status   MRSA, PCR NEGATIVE NEGATIVE Final   Staphylococcus aureus NEGATIVE NEGATIVE Final    Comment:        The Xpert SA Assay (FDA approved for NASAL specimens in patients over 62 years of age), is one component of a comprehensive surveillance program.  Test performance has been validated by [facility] for patients greater than or equal to 59 year old. It is not intended to diagnose infection nor to guide or monitor treatment.      Time coordinating discharge: Over 30 minutes  SIGNED:   Birdie Hopes, MD  [facility] 02/14/2016, 10:27 AM Pager   If 7PM-7AM, please contact night-coverage [email or web address] Password [email or web address]

## 2016-02-15 ENCOUNTER — Encounter (HOSPITAL_COMMUNITY): Payer: Self-pay | Admitting: Gastroenterology

## 2016-02-23 NOTE — Progress Notes (Signed)
HPI: FU CAD. Previously followed in [facility]. Patient had a non-ST elevation myocardial infarction in 2002. Cardiac catheterization revealed an 80% proximal right coronary artery followed by an 85%. There was a 90% circumflex. LV function normal. Patient had PCI of his circumflex and right coronary artery. Nuclear study in July of 2012 showed an ejection fraction of 76%. No ischemia.  Admitted 7/16 with a NSTEMI. LHC demonstrated pCFX 95% ISR which was treated with a DES. Residual CAD included mLAD 40%, pRCA stent ok with 25% ISR, mRCA 55%, dRCA 45%, RPDA 30%. EF was preserved.Repeat catheterization August 2017 secondary to chest pain revealed moderate coronary disease without significant obstruction. Medical therapy recommended. LV function normal. Head CT showed possible normal pressure hydrocephalus but neurology did not think further therapy indicated. Patient had ERCP with sphincterotomy for choledocholithiasis in August 2017.  Since last seen, Patient denies dyspnea, chest pain, palpitations or syncope.   Current Outpatient Prescriptions  Medication Sig Dispense Refill  . acetaminophen (TYLENOL) 500 MG tablet Take 1,000 mg by mouth every 6 (six) hours as needed for headache (pain).    Marland Kitchen amLODipine (NORVASC) 5 MG tablet TAKE 1 TABLET BY MOUTH DAILY 90 tablet 3  . aspirin EC 81 MG tablet Take 81 mg by mouth daily.    Marland Kitchen atorvastatin (LIPITOR) 80 MG tablet Take 1 tablet (80 mg total) by mouth daily. (Patient taking differently: Take 80 mg by mouth at bedtime. ) 90 tablet 3  . Krill Oil 500 MG CAPS Take 500 mg by mouth 2 (two) times daily.    . metoprolol tartrate (LOPRESSOR) 25 MG tablet TAKE 1 TABLET BY MOUTH DAILY (Patient taking differently: TAKE 1 TABLET BY MOUTH DAILY AT BEDTIME) 90 tablet 2  . Multiple Vitamin (MULTIVITAMIN WITH MINERALS) TABS tablet Take 1 tablet by mouth daily.    . nitroGLYCERIN (NITROSTAT) 0.4 MG SL tablet Place 1 tablet (0.4 mg total) under the  tongue every 5 (five) minutes as needed for chest pain. 25 tablet 3  . ramipril (ALTACE) 10 MG capsule TAKE ONE CAPSULE BY MOUTH TWICE DAILY 60 capsule 11  . sildenafil (VIAGRA) 100 MG tablet Take 1 tablet (100 mg total) by mouth daily as needed for erectile dysfunction. (Patient taking differently: Take 25 mg by mouth daily as needed for erectile dysfunction. ) 10 tablet 5  . zolpidem (AMBIEN) 5 MG tablet TAKE 1 TABLET BY MOUTH AT BEDTIME AS NEEDED FOR SLEEP 30 tablet 0   No current facility-administered medications for this visit.      Past Medical History:  Diagnosis Date  . CAD (coronary artery disease)    a. Cath 06/08/01 at [facility] with normal LM and LAD, 95% LCx s/p Circumflex stent 4.0 x 15 mm Penta and 85% and 80% prox RCA s/p 3.5 x 23 mm Penta b. cath 01/20/2015 95% prox LCx ISR treated with DES, 55% prox to mid RCA, 45% distal RCA, 40% midLAD, EF 55%  . Diabetes mellitus without complication ([place])   . Erectile dysfunction   . Hyperlipidemia   . Hypertension     Past Surgical History:  Procedure Laterality Date  . ANGIOPLASTY  2002   2 stents  . CARDIAC CATHETERIZATION N/A 01/20/2015   Procedure: Left Heart Cath and Coronary Angiography;  Surgeon: Belva Crome, MD;  Location: [place] [facility];  Service: Cardiovascular;  Laterality: N/A;  . CARDIAC CATHETERIZATION N/A 01/27/2016   Procedure: Left Heart Cath and Coronary Angiography;  Surgeon: Sherren Mocha,  MD;  Location: [place] [facility];  Service: Cardiovascular;  Laterality: N/A;  . CHOLECYSTECTOMY N/A 02/09/2016   Procedure: LAPAROSCOPIC CHOLECYSTECTOMY WITH INTRAOPERATIVE CHOLANGIOGRAM;  Surgeon: Erroll Luna, MD;  Location: [place];  Service: General;  Laterality: N/A;  . COLONOSCOPY W/ POLYPECTOMY  08/2013   Avg risk screening, Dr Thomasenia Sales. 5 mm tubular adenoma ascending.  Mild to moderated descending and sigmoid tics.  Internal hemorrhoids  . CORONARY ANGIOPLASTY WITH STENT PLACEMENT    . ERCP N/A 02/13/2016     Procedure: ENDOSCOPIC RETROGRADE CHOLANGIOPANCREATOGRAPHY (ERCP);  Surgeon: Doran Stabler, MD;  Location: [place];  Service: Gastroenterology;  Laterality: N/A;  . ROTATOR CUFF REPAIR Right 2009  . ROTATOR CUFF REPAIR Left 2014  . TRIGGER FINGER RELEASE  2013   4 fingers    Social History   Social History  . Marital status: Significant Other    Spouse name: N/A  . Number of children: 2  . Years of education: 17   Occupational History  . [employer]   Social History Main Topics  . Smoking status: Former Smoker    Packs/day: 1.00    Years: 44.00    Types: Cigarettes    Quit date: 01/17/2015  . Smokeless tobacco: Never Used  . Alcohol use Yes     Comment: daily  . Drug use: No  . Sexual activity: Yes   Other Topics Concern  . Not on file   Social History Narrative   Fun: Boat, golf   Denies religious beliefs effecting health care.     Family History  Problem Relation Age of Onset  . CAD Brother 61    Died age 30  . CAD Sister 81  . Heart disease Father 87    Valve replacement and CAD, CABG  . Dementia Mother   . Colon cancer Neg Hx     ROS: no fevers or chills, productive cough, hemoptysis, dysphasia, odynophagia, melena, hematochezia, dysuria, hematuria, rash, seizure activity, orthopnea, PND, pedal edema, claudication. Remaining systems are negative.  Physical Exam: Well-developed well-nourished in no acute distress.  Skin is warm and dry.  HEENT is normal.  Neck is supple.  Chest is clear to auscultation with normal expansion.  Cardiovascular exam is regular rate and rhythm.  Abdominal exam nontender or distended. No masses palpated.  Status post recent abdominal surgery for cholecystectomy Extremities show no edema. neuro grossly intact  ECG  A/P  1 Continue aspirin and statin.  2 hyperlipidemia-continue statin.  3 hypertension-blood pressure controlled. Continue present medications.  Kirk Ruths, MD

## 2016-02-25 ENCOUNTER — Encounter: Payer: Self-pay | Admitting: Family

## 2016-02-25 ENCOUNTER — Ambulatory Visit (INDEPENDENT_AMBULATORY_CARE_PROVIDER_SITE_OTHER): Payer: PRIVATE HEALTH INSURANCE | Admitting: Family

## 2016-02-25 ENCOUNTER — Ambulatory Visit (INDEPENDENT_AMBULATORY_CARE_PROVIDER_SITE_OTHER): Payer: PRIVATE HEALTH INSURANCE | Admitting: Cardiology

## 2016-02-25 ENCOUNTER — Encounter: Payer: Self-pay | Admitting: Cardiology

## 2016-02-25 VITALS — BP 120/79 | HR 73 | Ht 64.0 in | Wt 156.4 lb

## 2016-02-25 VITALS — BP 103/78 | HR 73 | Temp 98.1°F | Resp 16 | Ht 64.0 in | Wt 157.0 lb

## 2016-02-25 DIAGNOSIS — I10 Essential (primary) hypertension: Secondary | ICD-10-CM | POA: Diagnosis not present

## 2016-02-25 DIAGNOSIS — E785 Hyperlipidemia, unspecified: Secondary | ICD-10-CM

## 2016-02-25 DIAGNOSIS — I2510 Atherosclerotic heart disease of native coronary artery without angina pectoris: Secondary | ICD-10-CM

## 2016-02-25 DIAGNOSIS — K819 Cholecystitis, unspecified: Secondary | ICD-10-CM

## 2016-02-25 NOTE — Patient Instructions (Signed)
Your physician recommends that you continue on your current medications as directed. Please refer to the Current Medication list given to you today.   Your physician wants you to follow-up in: 6 MONTHS WITH DR CRENSHAW  You will receive a reminder letter in the mail two months in advance. If you don't receive a letter, please call our office to schedule the follow-up appointment.  

## 2016-02-25 NOTE — Patient Instructions (Addendum)
Thank you for choosing [facility].  SUMMARY AND INSTRUCTIONS:  Please continue to take your medications as prescribed.   Follow up with Dr. Stanford Breed.   Follow up:  If your symptoms worsen or fail to improve, please contact our office for further instruction, or in case of emergency go directly to the emergency room at the closest medical facility.

## 2016-02-25 NOTE — Assessment & Plan Note (Signed)
Acute cholecystitis resolved with cholecystectomy with no further complications or signs of infection. No current abdominal pain outside of his incisions which appear clean dry and intact and with good approximation. Pain adequately controlled with Tylenol. No further treatment is necessary at this time. Follow-up as needed.

## 2016-02-25 NOTE — Progress Notes (Signed)
Subjective:    Patient ID: Troy Boyer, male    DOB: Jan 24, 1951, 65 y.o.   MRN: [id number]  Chief Complaint  Patient presents with  . Follow-up    HPI:  Troy Boyer is a 65 y.o. male who  has a past medical history of CAD (coronary artery disease); Diabetes mellitus without complication ([place]); Erectile dysfunction; Hyperlipidemia; and Hypertension. and presents today for a follow up office visit.  This is a new problem. Recently evaluated in the emergency department and admitted to the hospital for abdominal pain described as cramping that have been going on for approximately 4 days prior to presentation. He did have episodes of nausea, vomiting, and diarrhea. Physical exam with tenderness of the right upper quadrant. His white blood cell count was elevated at 14.7. An ultrasound of the abdomen showed a mildly distended gallbladder completely filled with sludge and gallstones 1.4 cm gallstone in the gallbladder neck. Sonographic findings were consistent with acute cholecystitis. He was also noted to have a dilated, bile duct of 13 mm. He was diagnosed with acute cholecystitis and transferred to [facility] for a cholecystectomy. Following surgery he remained stable with a JP drain in place and follow-up with general surgery to have JP drain removed. He was transfused 2 units of pack red blood cells secondary to a hemoglobin dropping from 12.3-7.0. At discharge she was a 8.5. All hospital records, imaging, and labs were reviewed in detail.  Since leaving the hospital he reports that he has been doing well. He has seen general surgery and the JP drain was removed without complication. Notes that he has been eating and drinking well with a gradually increasing appetite. Denies nausea, vomiting, diarrhea, constipation, or blood in stool. Continues to experience mild pain from his incisional sites which is modified with Tylenol which improves his pain.    No Known Allergies    Outpatient  Medications Prior to Visit  Medication Sig Dispense Refill  . acetaminophen (TYLENOL) 500 MG tablet Take 1,000 mg by mouth every 6 (six) hours as needed for headache (pain).    Marland Kitchen amLODipine (NORVASC) 5 MG tablet TAKE 1 TABLET BY MOUTH DAILY 90 tablet 3  . aspirin EC 81 MG tablet Take 81 mg by mouth daily.    Marland Kitchen atorvastatin (LIPITOR) 80 MG tablet Take 1 tablet (80 mg total) by mouth daily. (Patient taking differently: Take 80 mg by mouth at bedtime. ) 90 tablet 3  . Krill Oil 500 MG CAPS Take 500 mg by mouth 2 (two) times daily.    . metoprolol tartrate (LOPRESSOR) 25 MG tablet TAKE 1 TABLET BY MOUTH DAILY (Patient taking differently: TAKE 1 TABLET BY MOUTH DAILY AT BEDTIME) 90 tablet 2  . Multiple Vitamin (MULTIVITAMIN WITH MINERALS) TABS tablet Take 1 tablet by mouth daily.    . nitroGLYCERIN (NITROSTAT) 0.4 MG SL tablet Place 1 tablet (0.4 mg total) under the tongue every 5 (five) minutes as needed for chest pain. 25 tablet 3  . ramipril (ALTACE) 10 MG capsule TAKE ONE CAPSULE BY MOUTH TWICE DAILY 60 capsule 11  . sildenafil (VIAGRA) 100 MG tablet Take 1 tablet (100 mg total) by mouth daily as needed for erectile dysfunction. (Patient taking differently: Take 25 mg by mouth daily as needed for erectile dysfunction. ) 10 tablet 5  . zolpidem (AMBIEN) 5 MG tablet TAKE 1 TABLET BY MOUTH AT BEDTIME AS NEEDED FOR SLEEP 30 tablet 0  . diphenoxylate-atropine (LOMOTIL) 2.5-0.025 MG tablet Take 1 tablet by mouth 4 (  four) times daily as needed for diarrhea or loose stools. 60 tablet 0  . Esomeprazole Magnesium (NEXIUM PO) Take 22.3 mg by mouth 2 (two) times daily.    Marland Kitchen HYDROcodone-acetaminophen (NORCO/VICODIN) 5-325 MG tablet Take 1-2 tablets by mouth every 6 (six) hours as needed for moderate pain. 20 tablet 0  . ondansetron (ZOFRAN) 4 MG tablet Take 1 tablet (4 mg total) by mouth every 8 (eight) hours as needed for nausea or vomiting. 20 tablet 0   No facility-administered medications prior to visit.        Past Surgical History:  Procedure Laterality Date  . ANGIOPLASTY  2002   2 stents  . CARDIAC CATHETERIZATION N/A 01/20/2015   Procedure: Left Heart Cath and Coronary Angiography;  Surgeon: Belva Crome, MD;  Location: [place] [facility];  Service: Cardiovascular;  Laterality: N/A;  . CARDIAC CATHETERIZATION N/A 01/27/2016   Procedure: Left Heart Cath and Coronary Angiography;  Surgeon: Sherren Mocha, MD;  Location: [place] [facility];  Service: Cardiovascular;  Laterality: N/A;  . CHOLECYSTECTOMY N/A 02/09/2016   Procedure: LAPAROSCOPIC CHOLECYSTECTOMY WITH INTRAOPERATIVE CHOLANGIOGRAM;  Surgeon: Erroll Luna, MD;  Location: [place];  Service: General;  Laterality: N/A;  . COLONOSCOPY W/ POLYPECTOMY  08/2013   Avg risk screening, Dr Thomasenia Sales. 5 mm tubular adenoma ascending.  Mild to moderated descending and sigmoid tics.  Internal hemorrhoids  . CORONARY ANGIOPLASTY WITH STENT PLACEMENT    . ERCP N/A 02/13/2016   Procedure: ENDOSCOPIC RETROGRADE CHOLANGIOPANCREATOGRAPHY (ERCP);  Surgeon: Doran Stabler, MD;  Location: [place];  Service: Gastroenterology;  Laterality: N/A;  . ROTATOR CUFF REPAIR Right 2009  . ROTATOR CUFF REPAIR Left 2014  . TRIGGER FINGER RELEASE  2013   4 fingers      Past Medical History:  Diagnosis Date  . CAD (coronary artery disease)    a. Cath 06/08/01 at [facility] with normal LM and LAD, 95% LCx s/p Circumflex stent 4.0 x 15 mm Penta and 85% and 80% prox RCA s/p 3.5 x 23 mm Penta b. cath 01/20/2015 95% prox LCx ISR treated with DES, 55% prox to mid RCA, 45% distal RCA, 40% midLAD, EF 55%  . Diabetes mellitus without complication ([place])   . Erectile dysfunction   . Hyperlipidemia   . Hypertension       Review of Systems  Constitutional: Negative for chills and fever.  Respiratory: Negative for chest tightness and shortness of breath.   Cardiovascular: Negative for chest pain, palpitations and leg swelling.  Gastrointestinal: Negative  for abdominal pain, blood in stool, constipation, diarrhea, nausea and vomiting.  Neurological: Negative for dizziness and weakness.      Objective:    BP 103/78 (BP Location: Left Arm, Patient Position: Sitting, Cuff Size: Normal)   Pulse 73   Temp 98.1 F (36.7 C) (Oral)   Resp 16   Ht 5\' 4"  (1.626 m)   Wt 157 lb (71.2 kg)   SpO2 97%   BMI 26.95 kg/m  Nursing note and vital signs reviewed.   Physical Exam  Constitutional: He is oriented to person, place, and time. He appears well-developed and well-nourished. No distress.  Cardiovascular: Normal rate, regular rhythm, normal heart sounds and intact distal pulses.   Pulmonary/Chest: Effort normal and breath sounds normal.  Abdominal: Normal appearance and bowel sounds are normal. He exhibits no mass. There is no hepatosplenomegaly. There is no tenderness. There is no rigidity, no rebound, no guarding, no tenderness at McBurney's point and negative  Murphy's sign.  4 incisions with dermabond appear with good approximation and healing with no evidence of infection.   Neurological: He is alert and oriented to person, place, and time.  Skin: Skin is warm and dry.  Psychiatric: He has a normal mood and affect. His behavior is normal. Judgment and thought content normal.       Assessment & Plan:   Problem List Items Addressed This Visit      Digestive   Cholecystitis - Primary    Acute cholecystitis resolved with cholecystectomy with no further complications or signs of infection. No current abdominal pain outside of his incisions which appear clean dry and intact and with good approximation. Pain adequately controlled with Tylenol. No further treatment is necessary at this time. Follow-up as needed.       Other Visit Diagnoses   None.      I have discontinued Mr. Beauford ondansetron, diphenoxylate-atropine, Esomeprazole Magnesium (NEXIUM PO), and HYDROcodone-acetaminophen. I am also having him maintain his nitroGLYCERIN,  sildenafil, ramipril, atorvastatin, metoprolol tartrate, amLODipine, zolpidem, multivitamin with minerals, aspirin EC, acetaminophen, and Krill Oil.   Follow-up: Return if symptoms worsen or fail to improve.  [name], FNP

## 2016-03-01 ENCOUNTER — Other Ambulatory Visit: Payer: Self-pay | Admitting: Cardiology

## 2016-03-11 ENCOUNTER — Other Ambulatory Visit: Payer: Self-pay | Admitting: Family

## 2016-03-11 NOTE — Telephone Encounter (Signed)
Faxed script back to [facility].../lmb 

## 2016-03-15 ENCOUNTER — Encounter: Payer: Self-pay | Admitting: Family

## 2016-03-30 ENCOUNTER — Other Ambulatory Visit: Payer: Self-pay | Admitting: Cardiology

## 2016-03-30 NOTE — Telephone Encounter (Signed)
Rx request sent to pharmacy.  

## 2016-04-07 ENCOUNTER — Telehealth: Payer: Self-pay | Admitting: Family

## 2016-04-07 NOTE — Telephone Encounter (Signed)
Pt would like to switch providers he says that the [facility] location is closer for him. Pt would like to be established with Dr. Nani Ravens.

## 2016-04-07 NOTE — Telephone Encounter (Signed)
As long as he knows that I do not rx Ambien for chronic sleep issues, I am happy to see him. I pursue other avenues for treating this. If he is not OK with this approach, he should be set up with someone else. Thanks.

## 2016-04-08 NOTE — Telephone Encounter (Signed)
Ok with me 

## 2016-04-14 NOTE — Telephone Encounter (Signed)
Pt says that he is definitely okay with other avenues. He is still coming in to establish with provider.

## 2016-04-21 ENCOUNTER — Encounter: Payer: Self-pay | Admitting: *Deleted

## 2016-04-21 ENCOUNTER — Telehealth: Payer: Self-pay | Admitting: *Deleted

## 2016-04-21 NOTE — Telephone Encounter (Signed)
Pre-Visit Call completed with patient and chart updated.   Pre-Visit Info documented in Specialty Comments under [employer].    

## 2016-04-22 ENCOUNTER — Ambulatory Visit (INDEPENDENT_AMBULATORY_CARE_PROVIDER_SITE_OTHER): Payer: PRIVATE HEALTH INSURANCE | Admitting: Family Medicine

## 2016-04-22 ENCOUNTER — Encounter: Payer: Self-pay | Admitting: Family Medicine

## 2016-04-22 ENCOUNTER — Ambulatory Visit: Payer: PRIVATE HEALTH INSURANCE | Admitting: Family

## 2016-04-22 VITALS — BP 116/58 | HR 75 | Temp 98.5°F | Ht 66.0 in | Wt 168.2 lb

## 2016-04-22 DIAGNOSIS — G4700 Insomnia, unspecified: Secondary | ICD-10-CM

## 2016-04-22 DIAGNOSIS — E785 Hyperlipidemia, unspecified: Secondary | ICD-10-CM | POA: Diagnosis not present

## 2016-04-22 DIAGNOSIS — E119 Type 2 diabetes mellitus without complications: Secondary | ICD-10-CM | POA: Diagnosis not present

## 2016-04-22 DIAGNOSIS — Z23 Encounter for immunization: Secondary | ICD-10-CM

## 2016-04-22 DIAGNOSIS — I2583 Coronary atherosclerosis due to lipid rich plaque: Secondary | ICD-10-CM

## 2016-04-22 DIAGNOSIS — I2510 Atherosclerotic heart disease of native coronary artery without angina pectoris: Secondary | ICD-10-CM

## 2016-04-22 LAB — COMPREHENSIVE METABOLIC PANEL
ALBUMIN: 4.5 g/dL (ref 3.5–5.2)
ALT: 28 U/L (ref 0–53)
AST: 20 U/L (ref 0–37)
Alkaline Phosphatase: 56 U/L (ref 39–117)
BUN: 14 mg/dL (ref 6–23)
CALCIUM: 9.4 mg/dL (ref 8.4–10.5)
CHLORIDE: 105 meq/L (ref 96–112)
CO2: 26 meq/L (ref 19–32)
CREATININE: 0.77 mg/dL (ref 0.40–1.50)
GFR: 108.08 mL/min (ref >60.00)
Glucose, Bld: 117 mg/dL — ABNORMAL HIGH (ref 70–99)
POTASSIUM: 4.1 meq/L (ref 3.5–5.1)
Sodium: 140 mEq/L (ref 135–145)
Total Bilirubin: 0.4 mg/dL (ref 0.2–1.2)
Total Protein: 7.1 g/dL (ref 6.0–8.3)

## 2016-04-22 LAB — LIPID PANEL
CHOL/HDL RATIO: 3
CHOLESTEROL: 148 mg/dL (ref 0–200)
HDL: 47.2 mg/dL (ref >39.00)
NonHDL: 101.09
TRIGLYCERIDES: 214 mg/dL — AB (ref 0.0–149.0)
VLDL: 42.8 mg/dL — AB (ref 0.0–40.0)

## 2016-04-22 LAB — LDL CHOLESTEROL, DIRECT: LDL DIRECT: 80 mg/dL

## 2016-04-22 LAB — HEMOGLOBIN A1C: HEMOGLOBIN A1C: 6 % (ref 4.6–6.5)

## 2016-04-22 MED ORDER — TRAZODONE HCL 50 MG PO TABS: 50.0000 mg | ORAL_TABLET | Freq: Every evening | ORAL | 2 refills | Status: DC | PRN

## 2016-04-22 NOTE — Addendum Note (Signed)
Addended by: [name] on: 04/22/2016 11:52 AM   Modules accepted: Orders

## 2016-04-22 NOTE — Patient Instructions (Signed)
Please consider counseling. The medical literature and evidence-based guidelines support it. Contact [phone] to schedule an appointment or inquire about cost/insurance coverage. Ask about cognitive behavioral therapy for sleep issues.

## 2016-04-22 NOTE — Progress Notes (Signed)
Subjective:   Chief Complaint  Patient presents with  . Establish Care    treansferring, Follow up about cholesterol and A1c    Troy Boyer is a 65 y.o. male here for follow-up of diabetes.   Troy Boyer's self monitored glucose range is low hundreds.  Patient denies hypoglycemic reactions. He checks his glucose levels 2 times per weeks. Patient does not require insulin. He is not on any medications for DM right now. Patient exercises 7 days per week on average- walking.   Patient has had diabetic and nutritional education.   He does take an aspirin daily. Statin? Yes ACEi/ARB? Yes  Hypertension Patient presents for hypertension follow up. He does monitor home blood pressures. Blood pressures ranging less than 120/80 on average. He is compliant with medications- Altace 10 mg daily, Metoprolol 25 mg BID, Norvasc 5 mg daily. Patient has these side effects of medication: none He specifically denies headache, visual changes, chest pain, palpitations, dyspnea, orthopnea, PND or peripheral edema. He is adhering to a low sodium and low fat diet. He exercises daily.   CAD Does have a hx of MI in 2016, NSTEMI. He is wondering whether he needs to be on a beta blocker and what they actually do. He denies having any chest pain or SOB. Of note, he knows not to use Viagra if he has used his Nitro. He has not used his nitro in many months.  Past Medical History:  Diagnosis Date  . CAD (coronary artery disease)    a. Cath 06/08/01 at [facility] with normal LM and LAD, 95% LCx s/p Circumflex stent 4.0 x 15 mm Penta and 85% and 80% prox RCA s/p 3.5 x 23 mm Penta b. cath 01/20/2015 95% prox LCx ISR treated with DES, 55% prox to mid RCA, 45% distal RCA, 40% midLAD, EF 55%  . Diabetes mellitus without complication ([place])   . Erectile dysfunction   . Hyperlipidemia   . Hypertension     Past Surgical History:  Procedure Laterality Date  . ANGIOPLASTY  2002   2 stents  . CARDIAC CATHETERIZATION  N/A 01/20/2015   Procedure: Left Heart Cath and Coronary Angiography;  Surgeon: Belva Crome, MD;  Location: [place] [facility];  Service: Cardiovascular;  Laterality: N/A;  . CARDIAC CATHETERIZATION N/A 01/27/2016   Procedure: Left Heart Cath and Coronary Angiography;  Surgeon: Sherren Mocha, MD;  Location: [place] [facility];  Service: Cardiovascular;  Laterality: N/A;  . CHOLECYSTECTOMY N/A 02/09/2016   Procedure: LAPAROSCOPIC CHOLECYSTECTOMY WITH INTRAOPERATIVE CHOLANGIOGRAM;  Surgeon: Erroll Luna, MD;  Location: [place];  Service: General;  Laterality: N/A;  . COLONOSCOPY W/ POLYPECTOMY  08/2013   Avg risk screening, Dr Thomasenia Sales. 5 mm tubular adenoma ascending.  Mild to moderated descending and sigmoid tics.  Internal hemorrhoids  . CORONARY ANGIOPLASTY WITH STENT PLACEMENT    . ERCP N/A 02/13/2016   Procedure: ENDOSCOPIC RETROGRADE CHOLANGIOPANCREATOGRAPHY (ERCP);  Surgeon: Doran Stabler, MD;  Location: [place];  Service: Gastroenterology;  Laterality: N/A;  . ROTATOR CUFF REPAIR Right 2009  . ROTATOR CUFF REPAIR Left 2014  . TRIGGER FINGER RELEASE  2013   4 fingers    Social History   Social History  . Marital status: Significant Other  . Number of children: 2  . Years of education: 78   Occupational History  . [employer]   Social History Main Topics  . Smoking status: Former Smoker    Packs/day: 1.00    Years: 44.00  Types: Cigarettes    Quit date: 01/17/2015  . Smokeless tobacco: Never Used  . Alcohol use Yes     Comment: daily  . Drug use: No  . Sexual activity: Yes   Social History Narrative   Fun: Boat, golf   Denies religious beliefs effecting health care.     Current Outpatient Prescriptions on File Prior to Visit  Medication Sig Dispense Refill  . amLODipine (NORVASC) 5 MG tablet TAKE 1 TABLET BY MOUTH DAILY 90 tablet 3  . aspirin EC 81 MG tablet Take 81 mg by mouth daily.    Marland Kitchen atorvastatin (LIPITOR) 80 MG tablet Take 1 tablet (80  mg total) by mouth daily. (Patient taking differently: Take 80 mg by mouth at bedtime. ) 90 tablet 3  . CINNAMON PO Take 1,000 mg by mouth daily.    Javier Docker Oil 500 MG CAPS Take 500 mg by mouth 2 (two) times daily.    . metoprolol tartrate (LOPRESSOR) 25 MG tablet TAKE 1 TABLET BY MOUTH DAILY 90 tablet 2  . Multiple Vitamin (MULTIVITAMIN WITH MINERALS) TABS tablet Take 1 tablet by mouth daily.    . nitroGLYCERIN (NITROSTAT) 0.4 MG SL tablet Place 1 tablet (0.4 mg total) under the tongue every 5 (five) minutes as needed for chest pain. 25 tablet 3  . ramipril (ALTACE) 10 MG capsule TAKE ONE CAPSULE BY MOUTH TWICE DAILY 180 capsule 3  . sildenafil (VIAGRA) 100 MG tablet Take 1 tablet (100 mg total) by mouth daily as needed for erectile dysfunction. (Patient taking differently: Take 25 mg by mouth daily as needed for erectile dysfunction. ) 10 tablet 5  . zolpidem (AMBIEN) 5 MG tablet TAKE 1 TABLET BY MOUTH AT BEDTIME AS NEEDED FOR SLEEP 30 tablet 0  . acetaminophen (TYLENOL) 500 MG tablet Take 1,000 mg by mouth every 6 (six) hours as needed for headache (pain).     Related testing: Retinal exam:done Date of retinal exam: 04/2015  Done by: [facility] Pneumovax: Receiving today Flu Shot: Refuses  Review of Systems: Eye:  No recent significant change in vision Pulmonary:  No SOB Cardiovascular:  No chest pain, no palpitations Skin/Integumentary ROS:  No abnormal skin lesions reported Neurologic:  No numbness, tingling  Objective:  BP (!) 116/58 (BP Location: Left Arm, Patient Position: Sitting, Cuff Size: Large)   Pulse 75   Temp 98.5 F (36.9 C) (Oral)   Ht 5\' 6"  (1.676 m)   Wt 168 lb 3.2 oz (76.3 kg)   SpO2 97%   BMI 27.15 kg/m  General:  Well developed, well nourished, in no apparent distress Skin:  Warm, no pallor or diaphoresis Head:  Normocephalic, atraumatic Eyes:  Pupils equal and round, sclera anicteric without injection  Nose:  External nares without trauma, no  discharge Throat/Pharynx:  Lips and gingiva without lesion Neck: Neck supple.  No obvious thyromegaly or masses.  No bruits Lungs:  clear to auscultation, breath sounds equal bilaterally, no wheezes, rales, or stridor Cardio:  regular rate and rhythm without murmurs Abdomen:  Abdomen soft, non-tender, BS normal Musculoskeletal:  Symmetrical muscle groups noted without atrophy or deformity Extremities:  No clubbing, cyanosis, or edema, no deformities, no skin discoloration Neuro:  Alert and oriented to person, place, and time.  Assessment:   Non-insulin dependent type 2 diabetes mellitus ([place]) - Plan: Microalbumin / creatinine urine ratio, Comprehensive metabolic panel, Hemoglobin A1c  Hyperlipidemia, unspecified hyperlipidemia type - Plan: Lipid panel  Coronary artery disease due to lipid rich plaque  Insomnia, unspecified type - Plan: traZODone (DESYREL) 50 MG tablet   Plan:   Orders as above. Want him to stay on ASA, ACEi, BB, and statin given his hx of MI. He voiced understanding and agreement to this. PCV13 when he turns 66. PCV23 in 5 years from today. Will change Ambien to trazodone. Also recommended calling number for counseling/CBT for his sleep. He is open to other options. F/u in 6 mo pending the above. The patient voiced understanding and agreement to the plan.  [place], DO 04/22/16 10:42 AM

## 2016-04-22 NOTE — Progress Notes (Signed)
Pre visit review using our clinic review tool, if applicable. No additional management support is needed unless otherwise documented below in the visit note. 

## 2016-04-23 LAB — MICROALBUMIN / CREATININE URINE RATIO
Creatinine,U: 128.4 mg/dL
MICROALB/CREAT RATIO: 0.5 mg/g (ref 0.0–30.0)

## 2016-04-25 ENCOUNTER — Other Ambulatory Visit: Payer: Self-pay | Admitting: Cardiology

## 2016-04-28 NOTE — Telephone Encounter (Signed)
Pt is requesting Rx for Viagra to be filled. Original prescriber is Kirk Ruths his Cardiologist. I reached out to patient to confirm why request was being sent to [place] and if they're are any changes we need to know about.Pt stated he didn't know why we received it. I did inform patient Dr. Nani Ravens would be out of the office until Monday and will advise recommendations then. TL/CMA

## 2016-05-03 ENCOUNTER — Encounter: Payer: Self-pay | Admitting: *Deleted

## 2016-05-03 ENCOUNTER — Other Ambulatory Visit: Payer: Self-pay | Admitting: Cardiology

## 2016-05-03 DIAGNOSIS — Z006 Encounter for examination for normal comparison and control in clinical research program: Secondary | ICD-10-CM

## 2016-05-03 NOTE — Progress Notes (Signed)
TWILIGHT Research study month 15 visit completed. Research Follow up visits are still completed even though patient is on open label ASA. Patient has had AE since last research visit (NSTEMI) and Laparoscopic Cholecystectomy with excessive bleeding (per patient) . The patient had to have ERCP at later date. Patient is only on ASA 81 mg at this time. Next research required phone call will be no later than 30/JAN/2018. Patient verbalized understanding.

## 2016-05-03 NOTE — Telephone Encounter (Signed)
[place] for [facility]

## 2016-05-21 ENCOUNTER — Other Ambulatory Visit: Payer: Self-pay | Admitting: Family Medicine

## 2016-05-21 DIAGNOSIS — G4700 Insomnia, unspecified: Secondary | ICD-10-CM

## 2016-05-24 NOTE — Telephone Encounter (Signed)
I have reached out patient and confirmed if he needed a refill for trazadone because he was given #2 refills on Rx per 04/22/2016. Pt stated pharmacy probably sent that electronically. I will decline Rx. TL/CMA

## 2016-05-30 ENCOUNTER — Encounter: Payer: Self-pay | Admitting: Family Medicine

## 2016-06-17 ENCOUNTER — Other Ambulatory Visit: Payer: Self-pay | Admitting: Family Medicine

## 2016-06-17 DIAGNOSIS — G4700 Insomnia, unspecified: Secondary | ICD-10-CM

## 2016-07-06 ENCOUNTER — Encounter: Payer: Self-pay | Admitting: *Deleted

## 2016-07-06 DIAGNOSIS — Z006 Encounter for examination for normal comparison and control in clinical research program: Secondary | ICD-10-CM

## 2016-07-06 NOTE — Progress Notes (Unsigned)
TWILIGHT Research study month 18 (End of Study) completed. Patient remains on open label ASA 81 mg daily with no bleeding or other adverse reactions. I thanked the patient for participating in the [place].

## 2016-07-19 ENCOUNTER — Other Ambulatory Visit: Payer: Self-pay | Admitting: Family Medicine

## 2016-07-19 DIAGNOSIS — G4700 Insomnia, unspecified: Secondary | ICD-10-CM

## 2016-07-20 ENCOUNTER — Other Ambulatory Visit: Payer: Self-pay

## 2016-07-20 DIAGNOSIS — I2510 Atherosclerotic heart disease of native coronary artery without angina pectoris: Secondary | ICD-10-CM

## 2016-07-20 MED ORDER — ATORVASTATIN CALCIUM 80 MG PO TABS: 80.0000 mg | ORAL_TABLET | Freq: Every day | ORAL | 0 refills | Status: DC

## 2016-07-20 NOTE — Telephone Encounter (Signed)
Received Trazodone request. Last rx by PCP on 06/17/17, #90 x 2 refills. Per Shirlee Limerick at [facility], they did not receive Rx. Verbal given as above.

## 2016-10-25 ENCOUNTER — Ambulatory Visit (INDEPENDENT_AMBULATORY_CARE_PROVIDER_SITE_OTHER): Payer: PRIVATE HEALTH INSURANCE | Admitting: Family Medicine

## 2016-10-25 ENCOUNTER — Encounter: Payer: Self-pay | Admitting: Family Medicine

## 2016-10-25 VITALS — BP 120/80 | HR 78 | Temp 98.3°F | Ht 64.0 in | Wt 172.5 lb

## 2016-10-25 DIAGNOSIS — E119 Type 2 diabetes mellitus without complications: Secondary | ICD-10-CM | POA: Diagnosis not present

## 2016-10-25 DIAGNOSIS — E785 Hyperlipidemia, unspecified: Secondary | ICD-10-CM | POA: Diagnosis not present

## 2016-10-25 DIAGNOSIS — I10 Essential (primary) hypertension: Secondary | ICD-10-CM | POA: Diagnosis not present

## 2016-10-25 LAB — LIPID PANEL
CHOL/HDL RATIO: 4
Cholesterol: 146 mg/dL (ref 0–200)
HDL: 40.6 mg/dL (ref >39.00)
NonHDL: 105.81
Triglycerides: 286 mg/dL — ABNORMAL HIGH (ref 0.0–149.0)
VLDL: 57.2 mg/dL — AB (ref 0.0–40.0)

## 2016-10-25 LAB — COMPREHENSIVE METABOLIC PANEL
ALT: 25 U/L (ref 0–53)
AST: 17 U/L (ref 0–37)
Albumin: 4.6 g/dL (ref 3.5–5.2)
Alkaline Phosphatase: 57 U/L (ref 39–117)
BUN: 16 mg/dL (ref 6–23)
CO2: 25 meq/L (ref 19–32)
Calcium: 9.3 mg/dL (ref 8.4–10.5)
Chloride: 104 mEq/L (ref 96–112)
Creatinine, Ser: 0.85 mg/dL (ref 0.40–1.50)
GFR: 96.27 mL/min (ref >60.00)
GLUCOSE: 139 mg/dL — AB (ref 70–99)
POTASSIUM: 4.3 meq/L (ref 3.5–5.1)
SODIUM: 138 meq/L (ref 135–145)
TOTAL PROTEIN: 7.1 g/dL (ref 6.0–8.3)
Total Bilirubin: 0.5 mg/dL (ref 0.2–1.2)

## 2016-10-25 LAB — HEMOGLOBIN A1C: Hgb A1c MFr Bld: 6.6 % — ABNORMAL HIGH (ref 4.6–6.5)

## 2016-10-25 LAB — LDL CHOLESTEROL, DIRECT: LDL DIRECT: 65 mg/dL

## 2016-10-25 NOTE — Progress Notes (Signed)
Subjective:   Chief Complaint  Patient presents with  . Follow-up    Troy Boyer is a 66 y.o. male here for follow-up of diabetes.   Theo's self monitored glucose range is low 100's. Patient denies hypoglycemic reactions. He checks his glucose levels 2 times per week. Patient does not require insulin.   Medications include: none Exercise: walking, going to start more now that it is getting warmer He does take an aspirin daily. Statin? Yes ACEi/ARB? Yes  Hypertension Patient presents for hypertension follow up. He does not routinely monitor home blood pressures. He is compliant with medications. Patient has these side effects of medication: none He is adhering to a healthy diet overall. Exercise: intermittent walking  Dyslipidemia Patient presents for dyslipidemia follow up. Compliance with treatment thus far has been good. He denies myalgias. He is adhering to healthy diet. The patient exercises a few times per week with walking.  The patient is known to have coexisting coronary artery disease.   Past Medical History:  Diagnosis Date  . CAD (coronary artery disease)    a. Cath 06/08/01 at [facility] with normal LM and LAD, 95% LCx s/p Circumflex stent 4.0 x 15 mm Penta and 85% and 80% prox RCA s/p 3.5 x 23 mm Penta b. cath 01/20/2015 95% prox LCx ISR treated with DES, 55% prox to mid RCA, 45% distal RCA, 40% midLAD, EF 55%  . Diabetes mellitus without complication ([place])   . Erectile dysfunction   . Hyperlipidemia   . Hypertension     Past Surgical History:  Procedure Laterality Date  . ANGIOPLASTY  2002   2 stents  . CARDIAC CATHETERIZATION N/A 01/20/2015   Procedure: Left Heart Cath and Coronary Angiography;  Surgeon: Belva Crome, MD;  Location: [place] [facility];  Service: Cardiovascular;  Laterality: N/A;  . CARDIAC CATHETERIZATION N/A 01/27/2016   Procedure: Left Heart Cath and Coronary Angiography;  Surgeon: Sherren Mocha, MD;  Location: [place] [facility]  [facility];  Service: Cardiovascular;  Laterality: N/A;  . CHOLECYSTECTOMY N/A 02/09/2016   Procedure: LAPAROSCOPIC CHOLECYSTECTOMY WITH INTRAOPERATIVE CHOLANGIOGRAM;  Surgeon: Erroll Luna, MD;  Location: [place];  Service: General;  Laterality: N/A;  . COLONOSCOPY W/ POLYPECTOMY  08/2013   Avg risk screening, Dr Thomasenia Sales. 5 mm tubular adenoma ascending.  Mild to moderated descending and sigmoid tics.  Internal hemorrhoids  . CORONARY ANGIOPLASTY WITH STENT PLACEMENT    . ERCP N/A 02/13/2016   Procedure: ENDOSCOPIC RETROGRADE CHOLANGIOPANCREATOGRAPHY (ERCP);  Surgeon: Doran Stabler, MD;  Location: [name];  Service: Gastroenterology;  Laterality: N/A;  . ROTATOR CUFF REPAIR Right 2009  . ROTATOR CUFF REPAIR Left 2014  . TRIGGER FINGER RELEASE  2013   4 fingers    Social History   Social History  . Marital status: Significant Other  . Number of children: 2  . Years of education: 23   Occupational History  . [employer]   Social History Main Topics  . Smoking status: Former Smoker    Packs/day: 1.00    Years: 44.00    Types: Cigarettes    Quit date: 01/17/2015  . Smokeless tobacco: Never Used  . Alcohol use Yes     Comment: daily  . Drug use: No  . Sexual activity: Yes   Social History Narrative   Fun: Boat, golf   Denies religious beliefs effecting health care.     Current Outpatient Prescriptions on File Prior to Visit  Medication Sig Dispense Refill  .  acetaminophen (TYLENOL) 500 MG tablet Take 1,000 mg by mouth every 6 (six) hours as needed for headache (pain).    Marland Kitchen amLODipine (NORVASC) 5 MG tablet TAKE 1 TABLET BY MOUTH DAILY 90 tablet 3  . aspirin EC 81 MG tablet Take 81 mg by mouth daily.    Marland Kitchen atorvastatin (LIPITOR) 80 MG tablet Take 1 tablet (80 mg total) by mouth at bedtime. 90 tablet 0  . CINNAMON PO Take 1,000 mg by mouth daily.    Javier Docker Oil 500 MG CAPS Take 500 mg by mouth 2 (two) times daily.    . metoprolol tartrate (LOPRESSOR) 25 MG tablet  TAKE 1 TABLET BY MOUTH DAILY 90 tablet 2  . Multiple Vitamin (MULTIVITAMIN WITH MINERALS) TABS tablet Take 1 tablet by mouth daily.    . nitroGLYCERIN (NITROSTAT) 0.4 MG SL tablet Place 1 tablet (0.4 mg total) under the tongue every 5 (five) minutes as needed for chest pain. 25 tablet 3  . ramipril (ALTACE) 10 MG capsule TAKE ONE CAPSULE BY MOUTH TWICE DAILY 180 capsule 3  . traZODone (DESYREL) 50 MG tablet TAKE 1 TABLET(50 MG) BY MOUTH AT BEDTIME AS NEEDED FOR SLEEP 90 tablet 2  . VIAGRA 100 MG tablet TAKE 1 TABLET BY MOUTH DAILY AS NEEDED FOR ERECTILE DYSFUNCTION 10 tablet 0   Related testing: Foot exam(monofilament and inspection):done Retinal exam:done Date of retinal exam: 04/2016  Done by:  Grover Pneumovax: 03/2016 Flu Shot: not done  Review of Systems: Eye:  No recent significant change in vision Pulmonary:  No SOB Cardiovascular:  No chest pain, no palpitations Skin/Integumentary ROS:  No abnormal skin lesions reported Neurologic:  No numbness, tingling  Objective:  BP 120/80 (BP Location: Left Arm, Patient Position: Sitting, Cuff Size: Normal)   Pulse 78   Temp 98.3 F (36.8 C) (Oral)   Ht 5\' 4"  (1.626 m)   Wt 172 lb 8 oz (78.2 kg)   SpO2 96%   BMI 29.61 kg/m  General:  Well developed, well nourished, in no apparent distress Skin:  Warm, no pallor or diaphoresis Head:  Normocephalic, atraumatic Eyes:  Pupils equal and round, sclera anicteric without injection  Nose:  External nares without trauma, no discharge Throat/Pharynx:  Lips and gingiva without lesion Neck: Neck supple.  No obvious thyromegaly or masses. No bruits Lungs:  clear to auscultation, breath sounds equal bilaterally, no wheezes, rales, or stridor Cardio:  regular rate and rhythm without murmurs, no bruits, no LE edema Musculoskeletal:  Symmetrical muscle groups noted without atrophy or deformity Neuro:  Sensation intact to pinprick on feet Psych: Age appropriate judgment and  insight  Assessment:   Non-insulin dependent type 2 diabetes mellitus ([place]) - Plan: Hemoglobin A1c, HM Diabetes Foot Exam  Essential hypertension - Plan: Comprehensive metabolic panel  Hyperlipidemia, unspecified hyperlipidemia type - Plan: Lipid panel   Plan:   Orders as above.  Well controlled, no changes.  PCV13 in 6 mo. F/u in 6 mo. The patient voiced understanding and agreement to the plan.  [place], DO 10/25/16 9:29 AM

## 2016-10-25 NOTE — Progress Notes (Signed)
Pre visit review using our clinic review tool, if applicable. No additional management support is needed unless otherwise documented below in the visit note. 

## 2016-10-25 NOTE — Patient Instructions (Signed)
Give us 2-3 business days to get the results of your labs back.  ° °

## 2016-10-27 ENCOUNTER — Other Ambulatory Visit: Payer: Self-pay | Admitting: Cardiology

## 2016-10-27 DIAGNOSIS — I2510 Atherosclerotic heart disease of native coronary artery without angina pectoris: Secondary | ICD-10-CM

## 2016-11-24 ENCOUNTER — Other Ambulatory Visit: Payer: Self-pay | Admitting: Cardiology

## 2016-11-24 DIAGNOSIS — I2510 Atherosclerotic heart disease of native coronary artery without angina pectoris: Secondary | ICD-10-CM

## 2016-11-24 NOTE — Telephone Encounter (Signed)
Ok to refill Troy Boyer  

## 2016-11-24 NOTE — Telephone Encounter (Signed)
Med called in to pharmacy ok per Dr Stanford Breed

## 2016-11-24 NOTE — Telephone Encounter (Signed)
Rx has been sent to the pharmacy electronically. ° °

## 2016-12-20 ENCOUNTER — Other Ambulatory Visit: Payer: Self-pay | Admitting: Cardiology

## 2017-01-18 ENCOUNTER — Other Ambulatory Visit: Payer: Self-pay | Admitting: Cardiology

## 2017-02-11 ENCOUNTER — Other Ambulatory Visit: Payer: Self-pay | Admitting: Cardiology

## 2017-02-11 ENCOUNTER — Encounter: Payer: Self-pay | Admitting: Cardiology

## 2017-02-11 ENCOUNTER — Other Ambulatory Visit: Payer: Self-pay

## 2017-02-11 DIAGNOSIS — I2510 Atherosclerotic heart disease of native coronary artery without angina pectoris: Secondary | ICD-10-CM

## 2017-02-11 MED ORDER — NITROGLYCERIN 0.4 MG SL SUBL: 0.4000 mg | SUBLINGUAL_TABLET | SUBLINGUAL | 0 refills | Status: DC | PRN

## 2017-02-14 ENCOUNTER — Other Ambulatory Visit: Payer: Self-pay | Admitting: Cardiology

## 2017-02-14 ENCOUNTER — Other Ambulatory Visit: Payer: Self-pay | Admitting: Family Medicine

## 2017-02-14 DIAGNOSIS — G4700 Insomnia, unspecified: Secondary | ICD-10-CM

## 2017-02-14 DIAGNOSIS — I2510 Atherosclerotic heart disease of native coronary artery without angina pectoris: Secondary | ICD-10-CM

## 2017-02-14 NOTE — Telephone Encounter (Signed)
Last office visit on 10/25/2016 Last refill on 07/20/2016   #90 with 2 refills No UDS/No Contract

## 2017-04-05 ENCOUNTER — Other Ambulatory Visit: Payer: Self-pay | Admitting: Cardiology

## 2017-04-08 ENCOUNTER — Other Ambulatory Visit: Payer: Self-pay | Admitting: Family Medicine

## 2017-04-08 DIAGNOSIS — G4700 Insomnia, unspecified: Secondary | ICD-10-CM

## 2017-04-14 IMAGING — RF DG CHOLANGIOGRAM OPERATIVE
1 series · 4 of 4 positions shown · non-contrast
Comparison: Prior abdominal ultrasound 02/07/2016

CLINICAL DATA: Acute calculus cholecystitis

EXAM:
INTRAOPERATIVE CHOLANGIOGRAM
TECHNIQUE: Cholangiographic images from the C-arm fluoroscopic device were
submitted for interpretation post-operatively. Please see the
procedural report for the amount of contrast and the fluoroscopy
time utilized.

[Series 1: run · 4 of 78 frames shown]
[frame 1/78]
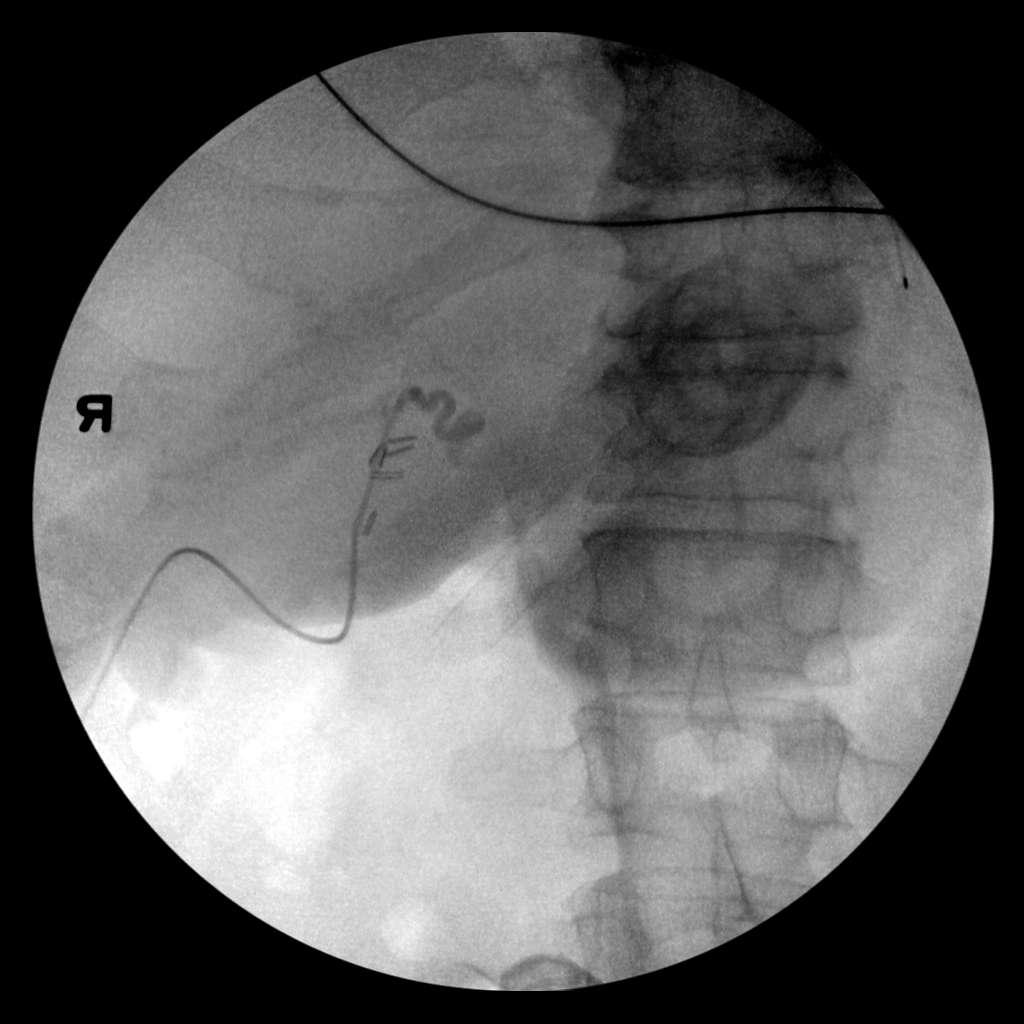
[frame 12/78]
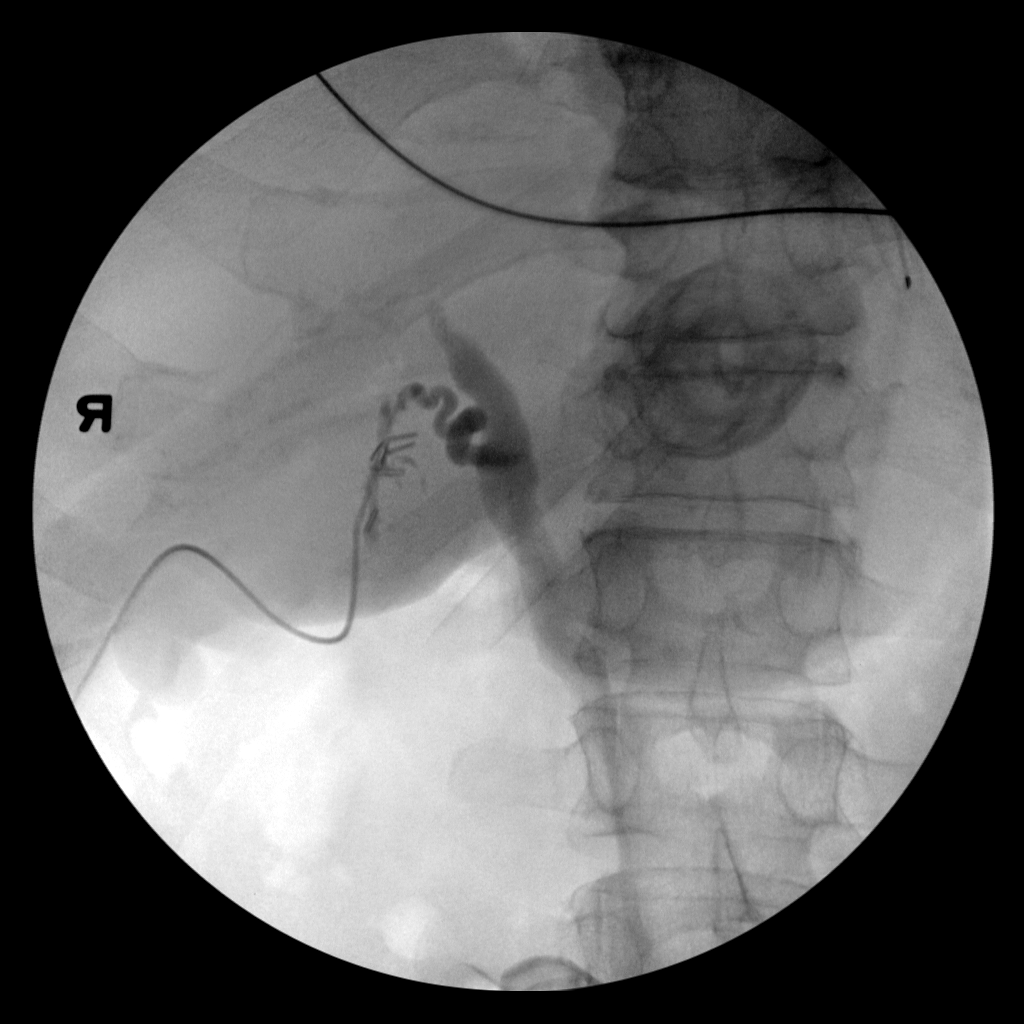
[frame 40/78]
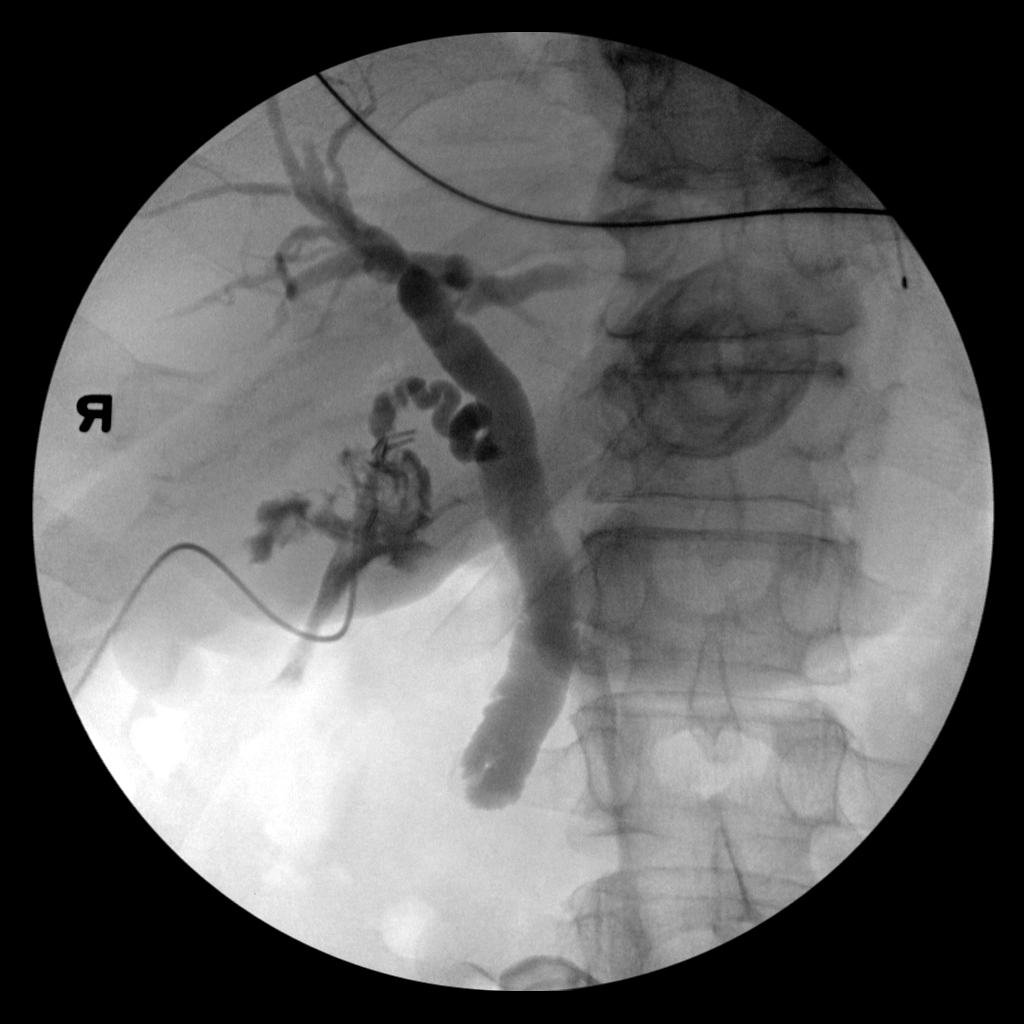
[frame 67/78]
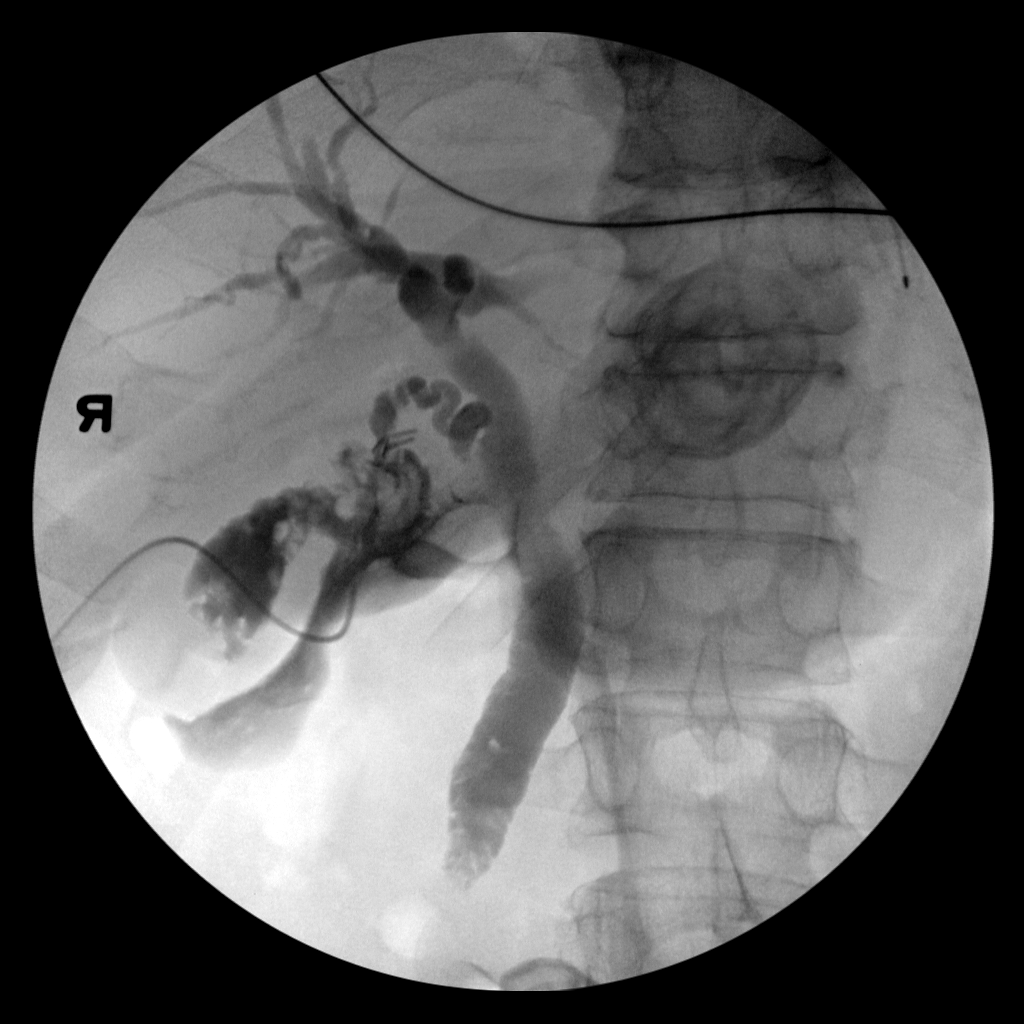

[4 of 4 positions shown; findings below may reference images not displayed]

FINDINGS: Images from an intraoperative cholangiogram obtained at the time of
laparoscopic cholecystectomy demonstrate cannulation of the cystic
duct remanent and opacification of the biliary tree. There is mild
intrahepatic biliary ductal dilatation and moderate dilatation of
the common bile duct. Irregular filling defects within the distal
common bile duct may represent sludge or small stones. The ampulla
is obstructed. There is extravasation of contrast material during
the injection.
IMPRESSION: 1. Biliary ductal dilatation with irregular filling defects in the
distal common bile duct concerning for sludge and/or small stones.
2. Intra and extrahepatic biliary ductal dilatation.

## 2017-04-18 IMAGING — RF DG ERCP WO/W SPHINCTEROTOMY
1 series · 9 of 9 positions shown · non-contrast
Comparison: Intraoperative cholangiogram 02/09/2016

CLINICAL DATA: 64-year-old male with a history of common bile duct
calculus

EXAM:
ERCP
TECHNIQUE: Multiple spot images obtained with the fluoroscopic device and
submitted for interpretation post-procedure.
FLUOROSCOPY TIME:  4 minutes 37 seconds

[Series 1: run · 9 of 9 slices shown]
[im 1/9]
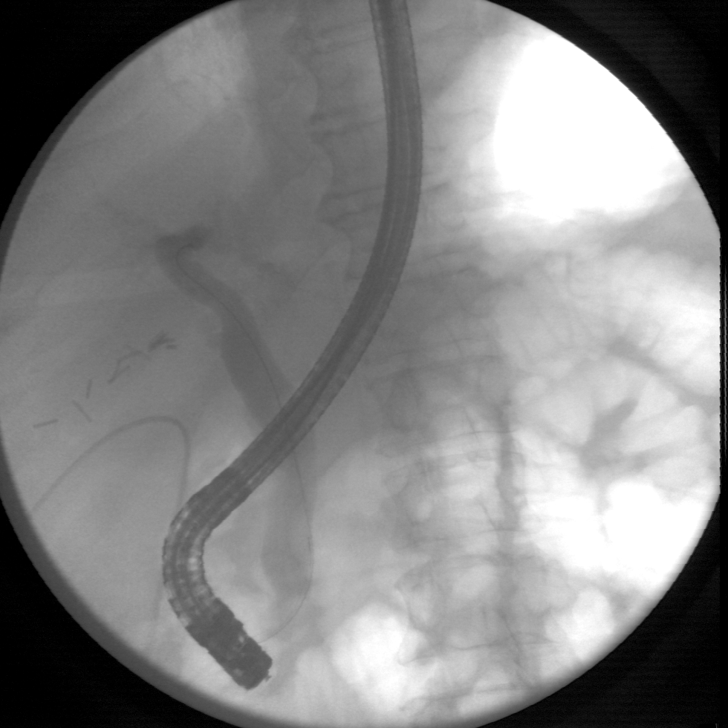
[im 2/9]
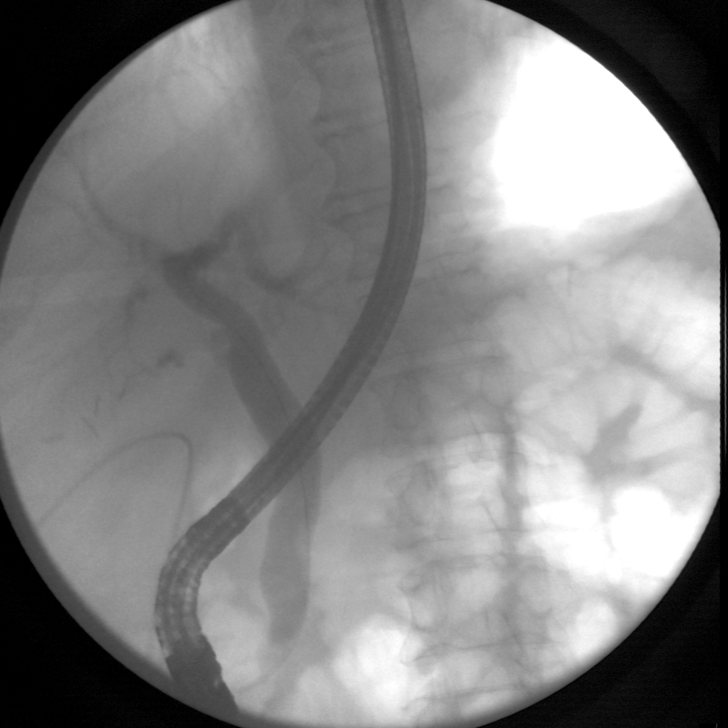
[im 3/9]
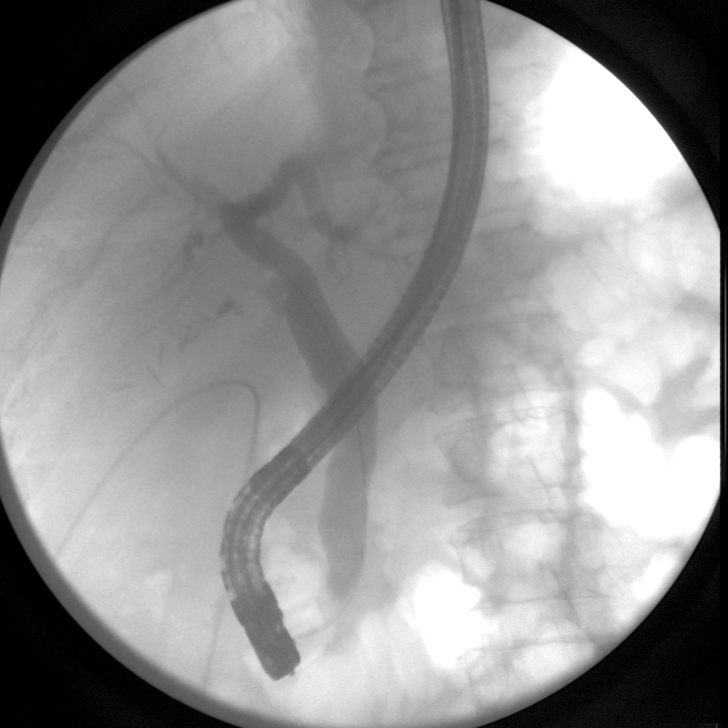
[im 4/9]
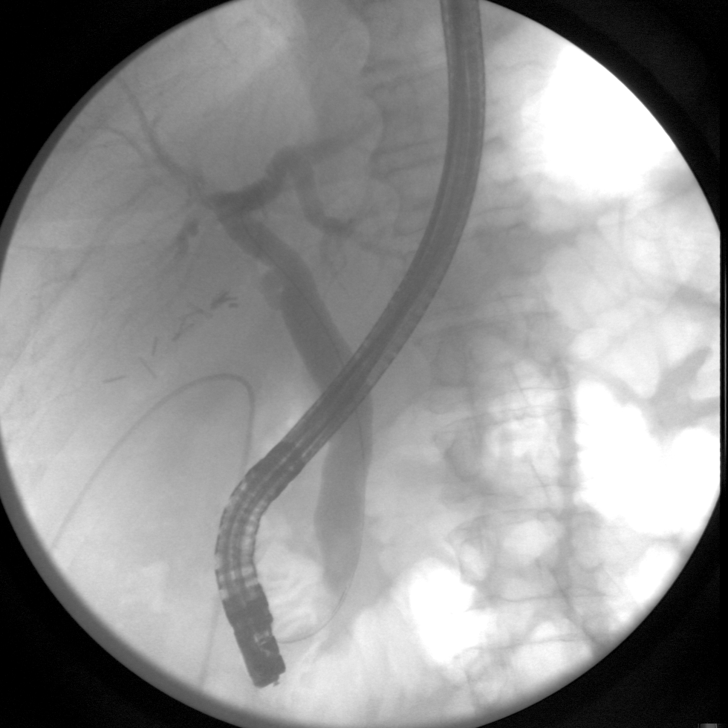
[im 5/9]
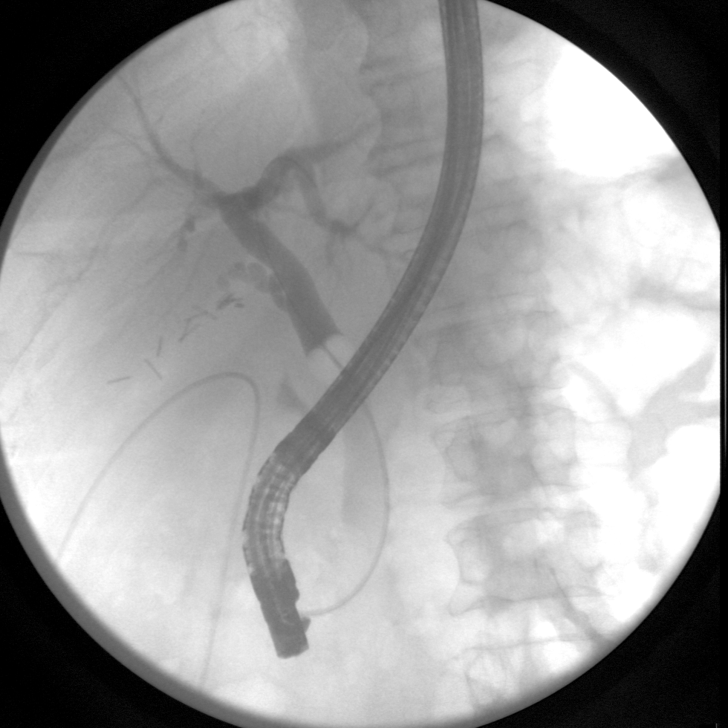
[im 6/9]
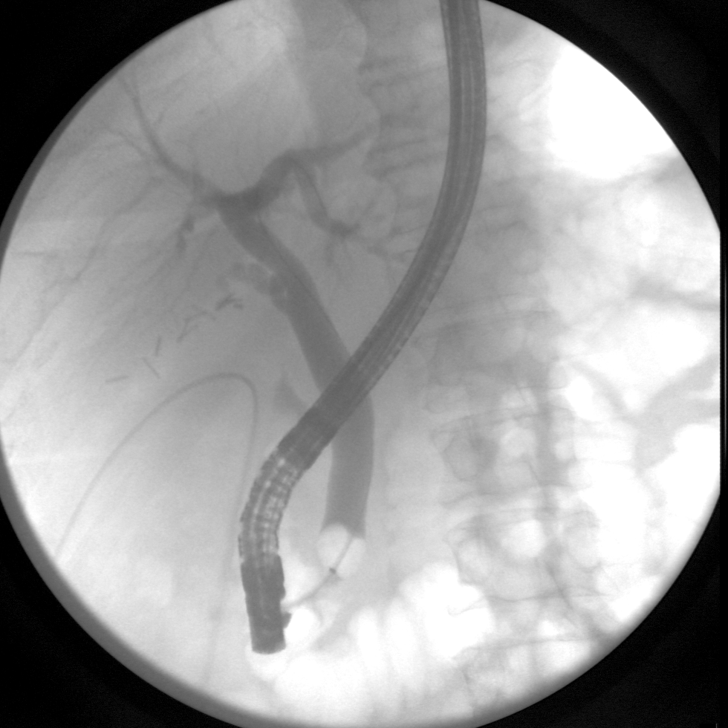
[im 7/9]
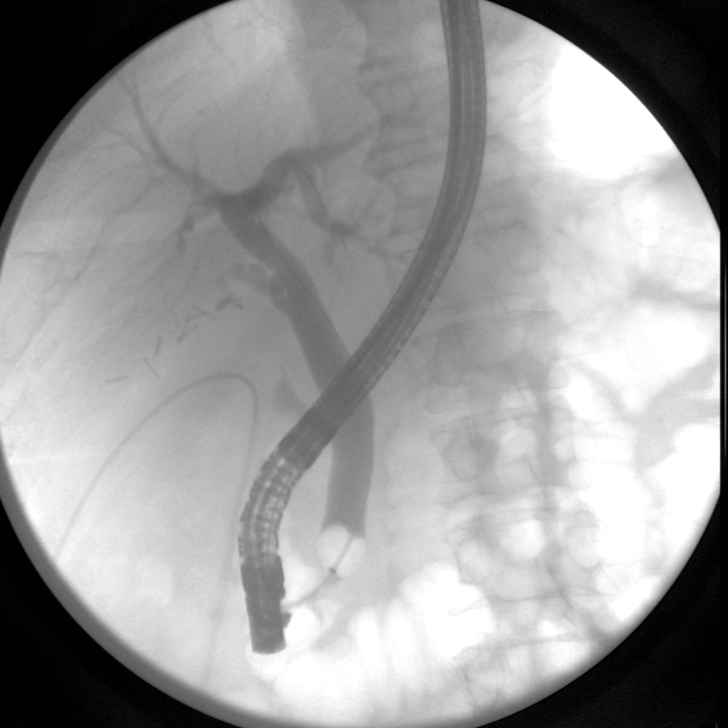
[im 8/9]
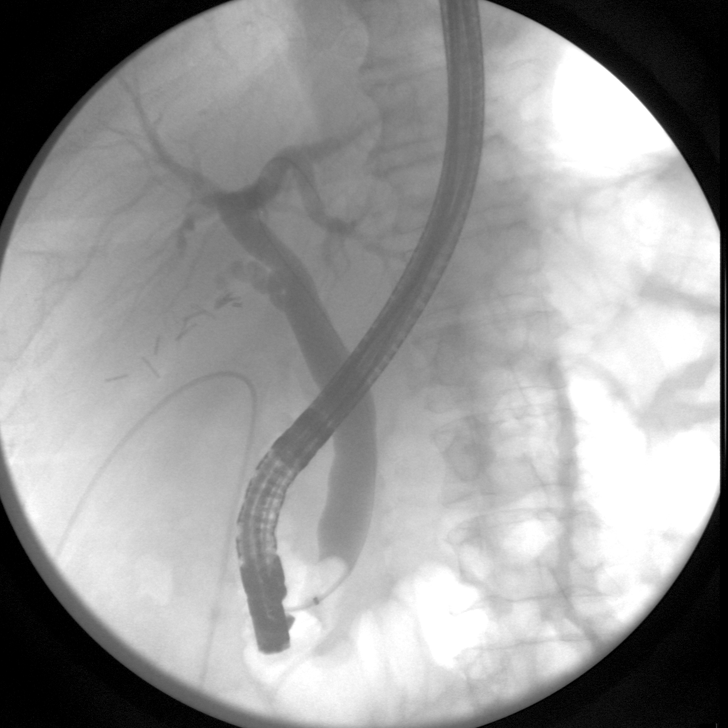
[im 9/9]
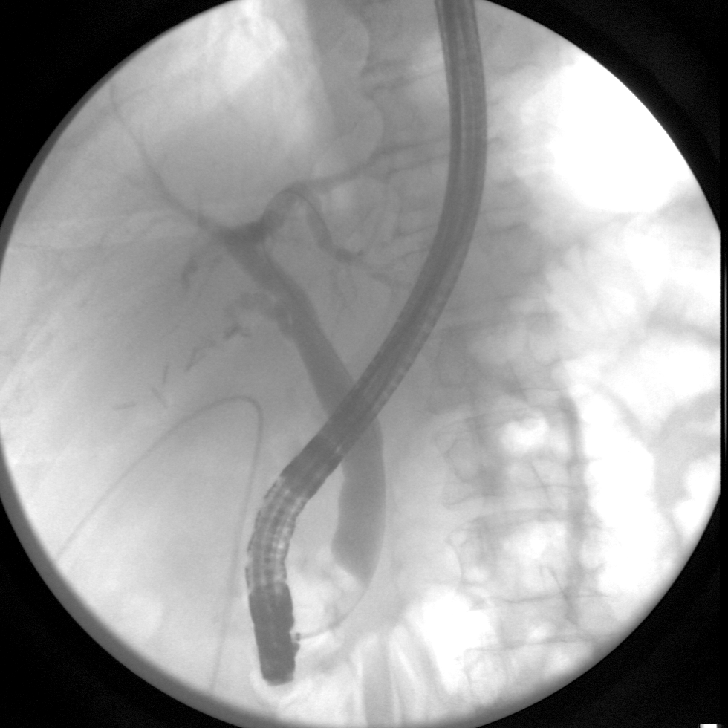

[9 of 9 positions shown; findings below may reference images not displayed]

FINDINGS: Limited intraoperative fluoroscopic spot images during ERCP.

Initial images demonstrates endoscope projecting over the upper
abdomen with cannulation of the ampulla and retrograde infusion of
contrast. Surgical changes of cholecystectomy.

Incomplete opacification of the extrahepatic biliary system.

Balloon basket present on several images, including the final image.
IMPRESSION: Limited fluoroscopic images during ERCP demonstrates stone retrieval
with balloon basket and surgical changes of cholecystectomy.

Please refer to the dictated operative report for full details of
intraoperative findings and procedure.

## 2017-04-27 ENCOUNTER — Encounter: Payer: Self-pay | Admitting: Family Medicine

## 2017-04-27 ENCOUNTER — Ambulatory Visit (INDEPENDENT_AMBULATORY_CARE_PROVIDER_SITE_OTHER): Payer: Medicare HMO | Admitting: Family Medicine

## 2017-04-27 VITALS — BP 120/72 | HR 77 | Temp 98.2°F | Ht 64.0 in | Wt 170.1 lb

## 2017-04-27 DIAGNOSIS — Z23 Encounter for immunization: Secondary | ICD-10-CM | POA: Diagnosis not present

## 2017-04-27 DIAGNOSIS — I10 Essential (primary) hypertension: Secondary | ICD-10-CM

## 2017-04-27 DIAGNOSIS — E119 Type 2 diabetes mellitus without complications: Secondary | ICD-10-CM | POA: Diagnosis not present

## 2017-04-27 DIAGNOSIS — Z2821 Immunization not carried out because of patient refusal: Secondary | ICD-10-CM

## 2017-04-27 DIAGNOSIS — I2510 Atherosclerotic heart disease of native coronary artery without angina pectoris: Secondary | ICD-10-CM | POA: Diagnosis not present

## 2017-04-27 DIAGNOSIS — E785 Hyperlipidemia, unspecified: Secondary | ICD-10-CM | POA: Diagnosis not present

## 2017-04-27 LAB — COMPREHENSIVE METABOLIC PANEL
ALBUMIN: 4.4 g/dL (ref 3.5–5.2)
ALT: 20 U/L (ref 0–53)
AST: 15 U/L (ref 0–37)
Alkaline Phosphatase: 53 U/L (ref 39–117)
BILIRUBIN TOTAL: 0.6 mg/dL (ref 0.2–1.2)
BUN: 15 mg/dL (ref 6–23)
CALCIUM: 9.8 mg/dL (ref 8.4–10.5)
CHLORIDE: 101 meq/L (ref 96–112)
CO2: 29 meq/L (ref 19–32)
CREATININE: 0.77 mg/dL (ref 0.40–1.50)
GFR: 107.73 mL/min (ref >60.00)
Glucose, Bld: 124 mg/dL — ABNORMAL HIGH (ref 70–99)
Potassium: 4.2 mEq/L (ref 3.5–5.1)
SODIUM: 138 meq/L (ref 135–145)
Total Protein: 6.9 g/dL (ref 6.0–8.3)

## 2017-04-27 LAB — LIPID PANEL
CHOL/HDL RATIO: 3
Cholesterol: 114 mg/dL (ref 0–200)
HDL: 38.8 mg/dL — AB (ref >39.00)
LDL Cholesterol: 46 mg/dL (ref 0–99)
NONHDL: 74.9
TRIGLYCERIDES: 145 mg/dL (ref 0.0–149.0)
VLDL: 29 mg/dL (ref 0.0–40.0)

## 2017-04-27 LAB — MICROALBUMIN / CREATININE URINE RATIO
CREATININE, U: 143.3 mg/dL
Microalb Creat Ratio: 0.5 mg/g (ref 0.0–30.0)
Microalb, Ur: 0.7 mg/dL (ref 0.0–1.9)

## 2017-04-27 LAB — HEMOGLOBIN A1C: Hgb A1c MFr Bld: 6.4 % (ref 4.6–6.5)

## 2017-04-27 MED ORDER — AMLODIPINE BESYLATE 5 MG PO TABS: 5.0000 mg | ORAL_TABLET | Freq: Every day | ORAL | 3 refills | Status: DC

## 2017-04-27 MED ORDER — ATORVASTATIN CALCIUM 80 MG PO TABS: 80.0000 mg | ORAL_TABLET | Freq: Every day | ORAL | 3 refills | Status: DC

## 2017-04-27 NOTE — Progress Notes (Signed)
Pre visit review using our clinic review tool, if applicable. No additional management support is needed unless otherwise documented below in the visit note. 

## 2017-04-27 NOTE — Patient Instructions (Signed)
Keep up the great work.  I will challenge you to lift weights.   Let us know if you need anything.

## 2017-04-27 NOTE — Progress Notes (Signed)
Subjective:   Chief Complaint  Patient presents with  . Follow-up    Troy Boyer is a 66 y.o. male here for follow-up of diabetes.   Troy Boyer's self monitored glucose range is low 100's.  Patient denies hypoglycemic reactions. He checks his glucose levels 1 times per week.  Patient does not require insulin.   Medications include: diet controlled Patient exercises 7 days per week on average.   He does take an aspirin daily. Statin? Yes ACEi/ARB? Yes  Hypertension Patient presents for hypertension follow up. He does monitor home blood pressures. He is compliant with medications.  He is on Norvasc 5 mg daily, Altace 10 mg daily.  Patient has these side effects of medication: none He is adhering to a healthy diet overall. Exercise: Walking  Dyslipidemia Patient presents for dyslipidemia follow up. Compliance with treatment thus far has been good. He denies myalgias. He is adhering to a low sodium and low fat diet. The patient exercises daily.   Past Medical History:  Diagnosis Date  . CAD (coronary artery disease)    a. Cath 06/08/01 at [facility] with normal LM and LAD, 95% LCx s/p Circumflex stent 4.0 x 15 mm Penta and 85% and 80% prox RCA s/p 3.5 x 23 mm Penta b. cath 01/20/2015 95% prox LCx ISR treated with DES, 55% prox to mid RCA, 45% distal RCA, 40% midLAD, EF 55%  . Diabetes mellitus without complication ([place])   . Erectile dysfunction   . Hyperlipidemia   . Hypertension     Past Surgical History:  Procedure Laterality Date  . ANGIOPLASTY  2002   2 stents  . CARDIAC CATHETERIZATION N/A 01/20/2015   Procedure: Left Heart Cath and Coronary Angiography;  Surgeon: Belva Crome, MD;  Location: [place] [facility];  Service: Cardiovascular;  Laterality: N/A;  . CARDIAC CATHETERIZATION N/A 01/27/2016   Procedure: Left Heart Cath and Coronary Angiography;  Surgeon: Sherren Mocha, MD;  Location: [place] [facility];  Service: Cardiovascular;  Laterality: N/A;  .  CHOLECYSTECTOMY N/A 02/09/2016   Procedure: LAPAROSCOPIC CHOLECYSTECTOMY WITH INTRAOPERATIVE CHOLANGIOGRAM;  Surgeon: Erroll Luna, MD;  Location: [place];  Service: General;  Laterality: N/A;  . COLONOSCOPY W/ POLYPECTOMY  08/2013   Avg risk screening, Dr Thomasenia Sales. 5 mm tubular adenoma ascending.  Mild to moderated descending and sigmoid tics.  Internal hemorrhoids  . CORONARY ANGIOPLASTY WITH STENT PLACEMENT    . ERCP N/A 02/13/2016   Procedure: ENDOSCOPIC RETROGRADE CHOLANGIOPANCREATOGRAPHY (ERCP);  Surgeon: Doran Stabler, MD;  Location: [place];  Service: Gastroenterology;  Laterality: N/A;  . ROTATOR CUFF REPAIR Right 2009  . ROTATOR CUFF REPAIR Left 2014  . TRIGGER FINGER RELEASE  2013   4 fingers    Social History   Social History  . Marital status: Significant Other  . Number of children: 2  . Years of education: 51   Occupational History  . [employer]   Social History Main Topics  . Smoking status: Former Smoker    Packs/day: 1.00    Years: 44.00    Types: Cigarettes    Quit date: 01/17/2015  . Smokeless tobacco: Never Used  . Alcohol use Yes     Comment: daily  . Drug use: No  . Sexual activity: Yes   Social History Narrative   Fun: Boat, golf   Denies religious beliefs effecting health care.     Current Outpatient Prescriptions on File Prior to Visit  Medication Sig Dispense Refill  .  acetaminophen (TYLENOL) 500 MG tablet Take 1,000 mg by mouth every 6 (six) hours as needed for headache (pain).    Marland Kitchen amLODipine (NORVASC) 5 MG tablet TAKE 1 TABLET BY MOUTH DAILY 90 tablet 0  . aspirin EC 81 MG tablet Take 81 mg by mouth daily.    Marland Kitchen atorvastatin (LIPITOR) 80 MG tablet TAKE 1 TABLET BY MOUTH EVERY NIGHT AT BEDTIME 90 tablet 0  . CINNAMON PO Take 1,000 mg by mouth daily.    Javier Docker Oil 500 MG CAPS Take 500 mg by mouth 2 (two) times daily.    . metoprolol tartrate (LOPRESSOR) 25 MG tablet TAKE 1 TABLET BY MOUTH DAILY 90 tablet 0  . Multiple  Vitamin (MULTIVITAMIN WITH MINERALS) TABS tablet Take 1 tablet by mouth daily.    . nitroGLYCERIN (NITROSTAT) 0.4 MG SL tablet Place 1 tablet (0.4 mg total) under the tongue every 5 (five) minutes as needed for chest pain. 25 tablet 0  . ramipril (ALTACE) 10 MG capsule TAKE ONE CAPSULE BY MOUTH TWICE DAILY 180 capsule 0  . sildenafil (VIAGRA) 100 MG tablet TAKE 1 TABLET BY MOUTH DAILY AS NEEDED FOR ERECTILE DYSFUNCTION 10 tablet 0  . traZODone (DESYREL) 50 MG tablet TAKE 1 TABLET BY MOUTH EVERY NIGHT AT BEDTIME AS NEEDED FOR SLEEP 90 tablet 1    Related testing: Foot exam(monofilament and inspection):done Date of retinal exam: 07/2016 Done by:  [facility] Pneumovax: PCV23 don 1 year ago, 13 today Flu Shot: not done; refuses  Review of Systems: Eye:  No recent significant change in vision Pulmonary:  No SOB Cardiovascular:  No chest pain, no palpitations Skin/Integumentary ROS:  No abnormal skin lesions reported Neurologic:  No numbness, tingling  Objective:  BP 120/72 (BP Location: Left Arm, Patient Position: Sitting, Cuff Size: Normal)   Pulse 77   Temp 98.2 F (36.8 C) (Oral)   Ht 5\' 4"  (1.626 m)   Wt 170 lb 2 oz (77.2 kg)   SpO2 96%   BMI 29.20 kg/m  General:  Well developed, well nourished, in no apparent distress Skin:  Warm, no pallor or diaphoresis Head:  Normocephalic, atraumatic Eyes:  Pupils equal and round, sclera anicteric without injection  Nose:  External nares without trauma, no discharge Throat/Pharynx:  Lips and gingiva without lesion Neck: Neck supple.  No obvious thyromegaly or masses.  No bruits Lungs:  clear to auscultation, breath sounds equal bilaterally, no wheezes, rales, or stridor Cardio:  regular rate and rhythm without murmurs, no bruits, no LE edema Abdomen:  Abdomen soft, non-tender, BS normal Musculoskeletal:  Symmetrical muscle groups noted without atrophy or deformity Neuro:  Sensation intact to pinprick on feet Psych: Age appropriate  judgment and insight  Assessment:   Essential hypertension - Plan: amLODipine (NORVASC) 5 MG tablet, Comprehensive metabolic panel  Non-insulin dependent type 2 diabetes mellitus ([place]) - Plan: Hemoglobin A1c, Comprehensive metabolic panel, Microalbumin / creatinine urine ratio, HM DIABETES FOOT EXAM  Hyperlipidemia, unspecified hyperlipidemia type  Coronary artery disease involving native coronary artery of native heart without angina pectoris - Plan: atorvastatin (LIPITOR) 80 MG tablet, Lipid panel  Need for vaccination against Streptococcus pneumoniae using pneumococcal conjugate vaccine 13 - Plan: Pneumococcal conjugate vaccine 13-valent  Refused influenza vaccine   Plan:   Orders as above. Doing well. Challenged him to lift wts.  PCV13 today, 23 in 2022 as it will need to be at least 5 years for next one.  F/u in 6 mo. The patient voiced understanding and  agreement to the plan.  McInty[place], DO 04/27/17 12:12 PM

## 2017-05-13 ENCOUNTER — Encounter: Payer: Self-pay | Admitting: Cardiology

## 2017-05-16 NOTE — Progress Notes (Signed)
HPI: FU CAD. Previously followed in [facility]. Patient had a non-ST elevation myocardial infarction in 2002. Cardiac catheterization revealed an 80% proximal right coronary artery followed by an 85%. There was a 90% circumflex. LV function normal. Patient had PCI of his circumflex and right coronary artery. Nuclear study in July of 2012 showed an ejection fraction of 76%. No ischemia.  Admitted 7/16 with a NSTEMI. LHC demonstrated pCFX 95% ISR which was treated with a DES. Residual CAD included mLAD 40%, pRCA stent ok with 25% ISR, mRCA 55%, dRCA 45%, RPDA 30%. EF was preserved.Repeat catheterization August 2017 secondary to chest pain revealed moderate coronary disease without significant obstruction. Medical therapy recommended. LV function normal. Head CT showed possible normal pressure hydrocephalus but neurology did not think further therapy indicated. Patient had ERCP with sphincterotomy for choledocholithiasis in August 2017.  Since last seen, the patient denies any dyspnea on exertion, orthopnea, PND, pedal edema, palpitations, syncope or chest pain.   Current Outpatient Medications  Medication Sig Dispense Refill  . acetaminophen (TYLENOL) 500 MG tablet Take 1,000 mg by mouth every 6 (six) hours as needed for headache (pain).    Marland Kitchen amLODipine (NORVASC) 5 MG tablet Take 1 tablet (5 mg total) by mouth daily. 90 tablet 3  . aspirin EC 81 MG tablet Take 81 mg by mouth daily.    Marland Kitchen atorvastatin (LIPITOR) 80 MG tablet Take 1 tablet (80 mg total) by mouth at bedtime. 90 tablet 3  . CINNAMON PO Take 1,000 mg by mouth daily.    Javier Docker Oil 500 MG CAPS Take 500 mg by mouth 2 (two) times daily.    . metoprolol tartrate (LOPRESSOR) 25 MG tablet TAKE 1 TABLET BY MOUTH DAILY 90 tablet 0  . Multiple Vitamin (MULTIVITAMIN WITH MINERALS) TABS tablet Take 1 tablet by mouth daily.    . nitroGLYCERIN (NITROSTAT) 0.4 MG SL tablet Place 1 tablet (0.4 mg total) under the tongue every 5 (five)  minutes as needed for chest pain. 25 tablet 0  . ramipril (ALTACE) 10 MG capsule TAKE ONE CAPSULE BY MOUTH TWICE DAILY 180 capsule 0  . sildenafil (VIAGRA) 100 MG tablet TAKE 1 TABLET BY MOUTH DAILY AS NEEDED FOR ERECTILE DYSFUNCTION 10 tablet 0  . traZODone (DESYREL) 50 MG tablet TAKE 1 TABLET BY MOUTH EVERY NIGHT AT BEDTIME AS NEEDED FOR SLEEP 90 tablet 1   No current facility-administered medications for this visit.      Past Medical History:  Diagnosis Date  . CAD (coronary artery disease)    a. Cath 06/08/01 at [facility] with normal LM and LAD, 95% LCx s/p Circumflex stent 4.0 x 15 mm Penta and 85% and 80% prox RCA s/p 3.5 x 23 mm Penta b. cath 01/20/2015 95% prox LCx ISR treated with DES, 55% prox to mid RCA, 45% distal RCA, 40% midLAD, EF 55%  . Diabetes mellitus without complication ([place])   . Erectile dysfunction   . Hyperlipidemia   . Hypertension     Past Surgical History:  Procedure Laterality Date  . ANGIOPLASTY  2002   2 stents  . CARDIAC CATHETERIZATION N/A 01/20/2015   Procedure: Left Heart Cath and Coronary Angiography;  Surgeon: Belva Crome, MD;  Location: [place] [facility];  Service: Cardiovascular;  Laterality: N/A;  . CARDIAC CATHETERIZATION N/A 01/27/2016   Procedure: Left Heart Cath and Coronary Angiography;  Surgeon: Sherren Mocha, MD;  Location: [place] [facility];  Service: Cardiovascular;  Laterality: N/A;  .  CHOLECYSTECTOMY N/A 02/09/2016   Procedure: LAPAROSCOPIC CHOLECYSTECTOMY WITH INTRAOPERATIVE CHOLANGIOGRAM;  Surgeon: Erroll Luna, MD;  Location: [place];  Service: General;  Laterality: N/A;  . COLONOSCOPY W/ POLYPECTOMY  08/2013   Avg risk screening, Dr Thomasenia Sales. 5 mm tubular adenoma ascending.  Mild to moderated descending and sigmoid tics.  Internal hemorrhoids  . CORONARY ANGIOPLASTY WITH STENT PLACEMENT    . ERCP N/A 02/13/2016   Procedure: ENDOSCOPIC RETROGRADE CHOLANGIOPANCREATOGRAPHY (ERCP);  Surgeon: Doran Stabler, MD;  Location: [place];  Service: Gastroenterology;  Laterality: N/A;  . ROTATOR CUFF REPAIR Right 2009  . ROTATOR CUFF REPAIR Left 2014  . TRIGGER FINGER RELEASE  2013   4 fingers    Social History   Socioeconomic History  . Marital status: Significant Other    Spouse name: Not on file  . Number of children: 2  . Years of education: 42  . Highest education level: Not on file  Social Needs  . Financial resource strain: Not on file  . Food insecurity - worry: Not on file  . Food insecurity - inability: Not on file  . Transportation needs - medical: Not on file  . Transportation needs - non-medical: Not on file  Occupational History  . 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 Smoking status: Former Smoker    Packs/day: 1.00    Years: 44.00    Pack years: 44.00    Types: Cigarettes    Last attempt to quit: 01/17/2015    Years since quitting: 2.3  . Smokeless tobacco: Never Used  Substance and Sexual Activity  . Alcohol use: Yes    Comment: daily  . Drug use: No  . Sexual activity: Yes  Other Topics Concern  . Not on file  Social History Narrative   Fun: Boat, golf   Denies religious beliefs effecting health care.     Family History  Problem Relation Age of Onset  . CAD Brother 40       Died age 51  . CAD Sister 46  . Heart disease Father 19       Valve replacement and CAD, CABG  . Dementia Mother   . Colon cancer Neg Hx     ROS: no fevers or chills, productive cough, hemoptysis, dysphasia, odynophagia, melena, hematochezia, dysuria, hematuria, rash, seizure activity, orthopnea, PND, pedal edema, claudication. Remaining systems are negative.  Physical Exam: Well-developed well-nourished in no acute distress.  Skin is warm and dry.  HEENT is normal.  Neck is supple.  Chest is clear to auscultation with normal expansion.  Cardiovascular exam is regular rate and rhythm.  Abdominal exam nontender or distended. No masses palpated. +  bruit Extremities show no edema. neuro grossly intact  ECG-sinus rhythm at a rate of 68 left axis deviation. personally reviewed  A/P  1 coronary artery disease-patient is doing well with no chest pain or dyspnea. Plan to continue medical therapy including aspirin and statin.  2 hypertension-blood pressure is elevated. However he states typically controlled. Continue present medications and follow with goal <130/85.  3 hyperlipidemia-continue statin. Laboratories from October 2018 personally reviewed. Liver functions normal. LDL 46.  4 bruit-schedule abdominal ultrasound to exclude aneurysm.  Kirk Ruths, MD

## 2017-05-25 ENCOUNTER — Ambulatory Visit (INDEPENDENT_AMBULATORY_CARE_PROVIDER_SITE_OTHER): Payer: Medicare HMO | Admitting: Cardiology

## 2017-05-25 ENCOUNTER — Ambulatory Visit (HOSPITAL_BASED_OUTPATIENT_CLINIC_OR_DEPARTMENT_OTHER)
Admission: RE | Admit: 2017-05-25 | Discharge: 2017-05-25 | Disposition: A | Discharge status: Home / Self Care | Payer: No Typology Code available for payment source | Source: Ambulatory Visit | Attending: Cardiology | Admitting: Cardiology

## 2017-05-25 ENCOUNTER — Encounter: Payer: Self-pay | Admitting: Cardiology

## 2017-05-25 VITALS — BP 147/98 | HR 68 | Ht 64.0 in | Wt 174.1 lb

## 2017-05-25 DIAGNOSIS — R0989 Other specified symptoms and signs involving the circulatory and respiratory systems: Secondary | ICD-10-CM

## 2017-05-25 DIAGNOSIS — I2510 Atherosclerotic heart disease of native coronary artery without angina pectoris: Secondary | ICD-10-CM | POA: Diagnosis not present

## 2017-05-25 DIAGNOSIS — E7800 Pure hypercholesterolemia, unspecified: Secondary | ICD-10-CM

## 2017-05-25 DIAGNOSIS — I10 Essential (primary) hypertension: Secondary | ICD-10-CM

## 2017-05-25 DIAGNOSIS — I77811 Abdominal aortic ectasia: Secondary | ICD-10-CM | POA: Insufficient documentation

## 2017-05-25 DIAGNOSIS — E785 Hyperlipidemia, unspecified: Secondary | ICD-10-CM | POA: Diagnosis not present

## 2017-05-25 NOTE — Patient Instructions (Signed)
Medication Instructions:   NO CHANGE  Testing/Procedures:  Your physician has requested that you have an abdominal aorta duplex. During this test, an ultrasound is used to evaluate the aorta. Allow 30 minutes for this exam. Do not eat after midnight the day before and avoid carbonated beverages   Follow-Up:  Your physician wants you to follow-up in: ONE YEAR WITH DR CRENSHAW You will receive a reminder letter in the mail two months in advance. If you don't receive a letter, please call our office to schedule the follow-up appointment.   If you need a refill on your cardiac medications before your next appointment, please call your pharmacy.    

## 2017-06-07 ENCOUNTER — Encounter: Payer: Self-pay | Admitting: Cardiology

## 2017-06-08 ENCOUNTER — Other Ambulatory Visit: Payer: Self-pay | Admitting: Cardiology

## 2017-06-08 ENCOUNTER — Other Ambulatory Visit: Payer: Self-pay | Admitting: *Deleted

## 2017-06-08 MED ORDER — METOPROLOL TARTRATE 25 MG PO TABS: 25.0000 mg | ORAL_TABLET | Freq: Every day | ORAL | 3 refills | Status: DC

## 2017-06-08 MED ORDER — RAMIPRIL 10 MG PO CAPS: 10.0000 mg | ORAL_CAPSULE | Freq: Two times a day (BID) | ORAL | 3 refills | Status: DC

## 2017-06-09 ENCOUNTER — Telehealth: Payer: Self-pay | Admitting: Family Medicine

## 2017-06-09 DIAGNOSIS — G4700 Insomnia, unspecified: Secondary | ICD-10-CM

## 2017-06-09 MED ORDER — TRAZODONE HCL 50 MG PO TABS: 50.0000 mg | ORAL_TABLET | Freq: Every evening | ORAL | 1 refills | Status: DC | PRN

## 2017-06-09 NOTE — Telephone Encounter (Signed)
Copied from [place]. Topic: Quick Communication - Rx Refill/Question >> Jun 09, 2017  2:24 PM Antonieta Iba C wrote: [facility] called in to request a refill request for traZODone   Pharmacy: [place], [place]   Agent: Please be advised that RX refills may take up to 3 business days. We ask that you follow-up with your pharmacy.

## 2017-06-09 NOTE — Addendum Note (Signed)
Addended by: Sharon Seller B on: 06/09/2017 04:31 PM   Modules accepted: Orders

## 2017-06-09 NOTE — Telephone Encounter (Signed)
OK to do. TY.  

## 2017-06-10 ENCOUNTER — Other Ambulatory Visit: Payer: Self-pay | Admitting: *Deleted

## 2017-06-10 MED ORDER — METOPROLOL TARTRATE 25 MG PO TABS: 25.0000 mg | ORAL_TABLET | Freq: Every day | ORAL | 3 refills | Status: DC

## 2017-06-10 MED ORDER — RAMIPRIL 10 MG PO CAPS: 10.0000 mg | ORAL_CAPSULE | Freq: Two times a day (BID) | ORAL | 3 refills | Status: DC

## 2017-06-14 ENCOUNTER — Other Ambulatory Visit: Payer: Self-pay

## 2017-06-14 MED ORDER — METOPROLOL TARTRATE 25 MG PO TABS: 25.0000 mg | ORAL_TABLET | Freq: Every day | ORAL | 3 refills | Status: DC

## 2017-06-14 MED ORDER — RAMIPRIL 10 MG PO CAPS: 10.0000 mg | ORAL_CAPSULE | Freq: Two times a day (BID) | ORAL | 3 refills | Status: DC

## 2017-06-15 DIAGNOSIS — J029 Acute pharyngitis, unspecified: Secondary | ICD-10-CM | POA: Diagnosis not present

## 2017-06-15 DIAGNOSIS — R05 Cough: Secondary | ICD-10-CM | POA: Diagnosis not present

## 2017-06-15 DIAGNOSIS — R0981 Nasal congestion: Secondary | ICD-10-CM | POA: Diagnosis not present

## 2017-06-15 DIAGNOSIS — J069 Acute upper respiratory infection, unspecified: Secondary | ICD-10-CM | POA: Diagnosis not present

## 2017-07-12 ENCOUNTER — Encounter: Payer: Self-pay | Admitting: Family Medicine

## 2017-08-01 DIAGNOSIS — B001 Herpesviral vesicular dermatitis: Secondary | ICD-10-CM | POA: Diagnosis not present

## 2017-08-01 DIAGNOSIS — B351 Tinea unguium: Secondary | ICD-10-CM | POA: Diagnosis not present

## 2017-08-24 DIAGNOSIS — H11442 Conjunctival cysts, left eye: Secondary | ICD-10-CM | POA: Diagnosis not present

## 2017-10-26 ENCOUNTER — Encounter: Payer: Self-pay | Admitting: Family Medicine

## 2017-10-26 ENCOUNTER — Ambulatory Visit (INDEPENDENT_AMBULATORY_CARE_PROVIDER_SITE_OTHER): Payer: Medicare HMO | Admitting: Family Medicine

## 2017-10-26 VITALS — BP 122/78 | HR 67 | Temp 98.3°F | Ht 64.0 in | Wt 172.1 lb

## 2017-10-26 DIAGNOSIS — E119 Type 2 diabetes mellitus without complications: Secondary | ICD-10-CM

## 2017-10-26 DIAGNOSIS — I10 Essential (primary) hypertension: Secondary | ICD-10-CM

## 2017-10-26 DIAGNOSIS — B351 Tinea unguium: Secondary | ICD-10-CM

## 2017-10-26 LAB — HEMOGLOBIN A1C: Hgb A1c MFr Bld: 6.7 % — ABNORMAL HIGH (ref 4.6–6.5)

## 2017-10-26 MED ORDER — TADALAFIL 20 MG PO TABS: 20.0000 mg | ORAL_TABLET | Freq: Every day | ORAL | 0 refills | Status: DC | PRN

## 2017-10-26 NOTE — Progress Notes (Signed)
Subjective:   Chief Complaint  Patient presents with  . Follow-up  . Nail Problem    Troy Boyer is a 67 y.o. male here for follow-up of diabetes.   Troy Boyer's self monitored glucose range is low 100's.  Patient denies hypoglycemic reactions. He checks his glucose levels 1 times per week. Patient does not require insulin.   Medications include: diet controlled Reports diet is healthy overall.  Hypertension Patient presents for hypertension follow up. He does not monitor home blood pressures. He is compliant with medications- Norvasc 5 mg/d, Altace 10 mg/d. Patient has these side effects of medication: none He is adhering to a healthy diet overall.  4-5 mo of toenail fungus on 2nd L toe. Thickened nails. Have been using OTC fungal med without relief. No pain or ingrown nails.    Past Medical History:  Diagnosis Date  . CAD (coronary artery disease)    a. Cath 06/08/01 at [facility] with normal LM and LAD, 95% LCx s/p Circumflex stent 4.0 x 15 mm Penta and 85% and 80% prox RCA s/p 3.5 x 23 mm Penta b. cath 01/20/2015 95% prox LCx ISR treated with DES, 55% prox to mid RCA, 45% distal RCA, 40% midLAD, EF 55%  . Diabetes mellitus without complication ([place])   . Erectile dysfunction   . Hyperlipidemia   . Hypertension     Past Surgical History:  Procedure Laterality Date  . ANGIOPLASTY  2002   2 stents  . CARDIAC CATHETERIZATION N/A 01/20/2015   Procedure: Left Heart Cath and Coronary Angiography;  Surgeon: Belva Crome, MD;  Location: [place] [facility];  Service: Cardiovascular;  Laterality: N/A;  . CARDIAC CATHETERIZATION N/A 01/27/2016   Procedure: Left Heart Cath and Coronary Angiography;  Surgeon: Sherren Mocha, MD;  Location: [place] [facility];  Service: Cardiovascular;  Laterality: N/A;  . CHOLECYSTECTOMY N/A 02/09/2016   Procedure: LAPAROSCOPIC CHOLECYSTECTOMY WITH INTRAOPERATIVE CHOLANGIOGRAM;  Surgeon: Erroll Luna, MD;  Location: [place];  Service: General;   Laterality: N/A;  . COLONOSCOPY W/ POLYPECTOMY  08/2013   Avg risk screening, Dr Thomasenia Sales. 5 mm tubular adenoma ascending.  Mild to moderated descending and sigmoid tics.  Internal hemorrhoids  . CORONARY ANGIOPLASTY WITH STENT PLACEMENT    . ERCP N/A 02/13/2016   Procedure: ENDOSCOPIC RETROGRADE CHOLANGIOPANCREATOGRAPHY (ERCP);  Surgeon: Doran Stabler, MD;  Location: [place];  Service: Gastroenterology;  Laterality: N/A;  . ROTATOR CUFF REPAIR Right 2009  . ROTATOR CUFF REPAIR Left 2014  . TRIGGER FINGER RELEASE  2013   4 fingers      Current Outpatient Medications on File Prior to Visit  Medication Sig Dispense Refill  . acetaminophen (TYLENOL) 500 MG tablet Take 1,000 mg by mouth every 6 (six) hours as needed for headache (pain).    Marland Kitchen amLODipine (NORVASC) 5 MG tablet Take 1 tablet (5 mg total) by mouth daily. 90 tablet 3  . aspirin EC 81 MG tablet Take 81 mg by mouth daily.    Marland Kitchen atorvastatin (LIPITOR) 80 MG tablet Take 1 tablet (80 mg total) by mouth at bedtime. 90 tablet 3  . CINNAMON PO Take 1,000 mg by mouth daily.    Javier Docker Oil 500 MG CAPS Take 500 mg by mouth 2 (two) times daily.    . metoprolol tartrate (LOPRESSOR) 25 MG tablet Take 1 tablet (25 mg total) by mouth daily. 90 tablet 3  . Multiple Vitamin (MULTIVITAMIN WITH MINERALS) TABS tablet Take 1 tablet by mouth daily.    Marland Kitchen  nitroGLYCERIN (NITROSTAT) 0.4 MG SL tablet Place 1 tablet (0.4 mg total) under the tongue every 5 (five) minutes as needed for chest pain. 25 tablet 0  . ramipril (ALTACE) 10 MG capsule Take 1 capsule (10 mg total) by mouth 2 (two) times daily. 180 capsule 3  . sildenafil (VIAGRA) 100 MG tablet TAKE 1 TABLET BY MOUTH DAILY AS NEEDED FOR ERECTILE DYSFUNCTION 10 tablet 0  . traZODone (DESYREL) 50 MG tablet Take 1 tablet (50 mg total) by mouth at bedtime as needed. for sleep 90 tablet 1   No Known Allergies  Related testing: Date of retinal exam: 06/2017  Done by:  Mauri Reading Pneumovax: PCV13  done, PCV23 due in 03/2021 Flu Shot: UTD  Review of Systems: Pulmonary:  No SOB Cardiovascular:  No chest pain  Objective:  BP 122/78 (BP Location: Left Arm, Patient Position: Sitting, Cuff Size: Normal)   Pulse 67   Temp 98.3 F (36.8 C) (Oral)   Ht 5\' 4"  (1.626 m)   Wt 172 lb 2 oz (78.1 kg)   SpO2 (!) 7%   BMI 29.55 kg/m  General:  Well developed, well nourished, in no apparent distress Skin:  Thickened nail on 2nd toe on L Head:  Normocephalic, atraumatic Eyes:  Pupils equal and round, sclera anicteric without injection  Lungs:  CTAB, no access msc use Cardio:  RRR, no bruits, no LE edema Psych: Age appropriate judgment and insight  Assessment:   Non-insulin dependent type 2 diabetes mellitus (HCC) - Plan: Hemoglobin A1c  Essential hypertension  Onychomycosis   Plan:   Orders as above. Well controlled. Counseled on diet and exercise. He is doing well. Discussed PO vs topical options for fungus. Decided against PO options.  F/u in 6 mo for CPE. The patient voiced understanding and agreement to the plan.  [place], DO 10/26/17 11:21 AM

## 2017-10-26 NOTE — Progress Notes (Signed)
Pre visit review using our clinic review tool, if applicable. No additional management support is needed unless otherwise documented below in the visit note. 

## 2017-10-26 NOTE — Patient Instructions (Signed)
Contact your insurance company if this medicine is too expensive. Alternatively, you can find manufacturer's coupons online and I can call them in.   Keep up the good work with your diabetes. Stay active and keep the diet clean.  Let us know if you need anything.

## 2017-10-27 ENCOUNTER — Encounter: Payer: Self-pay | Admitting: Family Medicine

## 2017-10-31 ENCOUNTER — Other Ambulatory Visit: Payer: Self-pay | Admitting: Family Medicine

## 2017-10-31 MED ORDER — TADALAFIL 20 MG PO TABS: 20.0000 mg | ORAL_TABLET | Freq: Every day | ORAL | 0 refills | Status: DC | PRN

## 2017-11-11 DIAGNOSIS — J029 Acute pharyngitis, unspecified: Secondary | ICD-10-CM | POA: Diagnosis not present

## 2017-11-11 DIAGNOSIS — J039 Acute tonsillitis, unspecified: Secondary | ICD-10-CM | POA: Diagnosis not present

## 2018-02-13 ENCOUNTER — Other Ambulatory Visit: Payer: Self-pay | Admitting: Family Medicine

## 2018-02-13 DIAGNOSIS — G4700 Insomnia, unspecified: Secondary | ICD-10-CM

## 2018-02-13 DIAGNOSIS — I2510 Atherosclerotic heart disease of native coronary artery without angina pectoris: Secondary | ICD-10-CM

## 2018-02-13 DIAGNOSIS — I10 Essential (primary) hypertension: Secondary | ICD-10-CM

## 2018-02-13 NOTE — Telephone Encounter (Signed)
Last refill 06/09/2017 #90 with 1 refill Last office visit 10/26/2017

## 2018-03-01 DIAGNOSIS — A084 Viral intestinal infection, unspecified: Secondary | ICD-10-CM | POA: Diagnosis not present

## 2018-04-26 ENCOUNTER — Encounter: Payer: Self-pay | Admitting: Family Medicine

## 2018-04-27 ENCOUNTER — Encounter: Payer: Self-pay | Admitting: Family Medicine

## 2018-04-28 MED ORDER — TADALAFIL 20 MG PO TABS: 20.0000 mg | ORAL_TABLET | Freq: Every day | ORAL | 0 refills | Status: DC | PRN

## 2018-04-28 NOTE — Progress Notes (Deleted)
Mr. Brackins,  [place] billing manager has verified that Dr. Stanford Breed will be in network with all [employer] for 2020.  Please contact us if you have further questions. [place] [facility]

## 2018-04-28 NOTE — Telephone Encounter (Signed)
Called patient and advised that Dr. Stanford Breed will be a participating provider with [facility] Medicare plans for next year.

## 2018-05-01 ENCOUNTER — Other Ambulatory Visit: Payer: Self-pay | Admitting: Cardiology

## 2018-05-01 NOTE — Telephone Encounter (Signed)
Patient needs to keep appt in December 2019

## 2018-05-11 ENCOUNTER — Encounter: Payer: Self-pay | Admitting: Family Medicine

## 2018-05-11 ENCOUNTER — Ambulatory Visit (INDEPENDENT_AMBULATORY_CARE_PROVIDER_SITE_OTHER): Payer: Medicare HMO | Admitting: Family Medicine

## 2018-05-11 VITALS — BP 132/90 | HR 74 | Temp 98.0°F | Ht 64.0 in | Wt 173.1 lb

## 2018-05-11 DIAGNOSIS — E119 Type 2 diabetes mellitus without complications: Secondary | ICD-10-CM | POA: Diagnosis not present

## 2018-05-11 DIAGNOSIS — Z23 Encounter for immunization: Secondary | ICD-10-CM | POA: Diagnosis not present

## 2018-05-11 DIAGNOSIS — Z125 Encounter for screening for malignant neoplasm of prostate: Secondary | ICD-10-CM | POA: Diagnosis not present

## 2018-05-11 DIAGNOSIS — Z0000 Encounter for general adult medical examination without abnormal findings: Secondary | ICD-10-CM

## 2018-05-11 LAB — COMPREHENSIVE METABOLIC PANEL
ALBUMIN: 4.7 g/dL (ref 3.5–5.2)
ALK PHOS: 49 U/L (ref 39–117)
ALT: 27 U/L (ref 0–53)
AST: 19 U/L (ref 0–37)
BILIRUBIN TOTAL: 0.8 mg/dL (ref 0.2–1.2)
BUN: 12 mg/dL (ref 6–23)
CALCIUM: 9.9 mg/dL (ref 8.4–10.5)
CO2: 28 mEq/L (ref 19–32)
Chloride: 101 mEq/L (ref 96–112)
Creatinine, Ser: 0.79 mg/dL (ref 0.40–1.50)
GFR: 104.26 mL/min (ref >60.00)
Glucose, Bld: 135 mg/dL — ABNORMAL HIGH (ref 70–99)
Potassium: 4.3 mEq/L (ref 3.5–5.1)
Sodium: 138 mEq/L (ref 135–145)
TOTAL PROTEIN: 6.8 g/dL (ref 6.0–8.3)

## 2018-05-11 LAB — PSA: PSA: 1.83 ng/mL (ref 0.10–4.00)

## 2018-05-11 LAB — LDL CHOLESTEROL, DIRECT: LDL DIRECT: 61 mg/dL

## 2018-05-11 LAB — HEMOGLOBIN A1C: Hgb A1c MFr Bld: 6.6 % — ABNORMAL HIGH (ref 4.6–6.5)

## 2018-05-11 LAB — LIPID PANEL
CHOLESTEROL: 129 mg/dL (ref 0–200)
HDL: 45.2 mg/dL (ref >39.00)
NONHDL: 84.18
TRIGLYCERIDES: 211 mg/dL — AB (ref 0.0–149.0)
Total CHOL/HDL Ratio: 3
VLDL: 42.2 mg/dL — ABNORMAL HIGH (ref 0.0–40.0)

## 2018-05-11 MED ORDER — RELION LANCETS MICRO-THIN 33G MISC: 0 refills | Status: DC

## 2018-05-11 MED ORDER — GLUCOSE BLOOD VI STRP: ORAL_STRIP | 0 refills | Status: DC

## 2018-05-11 NOTE — Addendum Note (Signed)
Addended by: Sharon Seller B on: 05/11/2018 11:55 AM   Modules accepted: Orders

## 2018-05-11 NOTE — Patient Instructions (Signed)
Give [place] 2-3 business days to get the results of your labs back.   Aim to do some physical exertion for 150 minutes per week. This is typically divided into 5 days per week, 30 minutes per day. The activity should be enough to get your heart rate up. Anything is better than nothing if you have time constraints.  Keep diet clean.  Let us know if you need anything.

## 2018-05-11 NOTE — Progress Notes (Signed)
Pre visit review using our clinic review tool, if applicable. No additional management support is needed unless otherwise documented below in the visit note. 

## 2018-05-11 NOTE — Progress Notes (Signed)
Chief Complaint  Patient presents with  . Annual Exam    Well Male Troy Boyer is here for a complete physical. Here with his wife.  His last physical was >1 year ago.  Current diet: in general, diet could be better.   Current exercise: No scheduled  Weight trend: stable Daytime fatigue? No. Seat belt? Yes.    Health maintenance Shingrix- Yes Colonoscopy- Yes Tetanus- Yes Hep C- Yes Pneumonia vaccine- Yes  Past Medical History:  Diagnosis Date  . CAD (coronary artery disease)    a. Cath 06/08/01 at [facility] with normal LM and LAD, 95% LCx s/p Circumflex stent 4.0 x 15 mm Penta and 85% and 80% prox RCA s/p 3.5 x 23 mm Penta b. cath 01/20/2015 95% prox LCx ISR treated with DES, 55% prox to mid RCA, 45% distal RCA, 40% midLAD, EF 55%  . Diabetes mellitus without complication ([place])   . Erectile dysfunction   . Hyperlipidemia   . Hypertension      Past Surgical History:  Procedure Laterality Date  . ANGIOPLASTY  2002   2 stents  . CARDIAC CATHETERIZATION N/A 01/20/2015   Procedure: Left Heart Cath and Coronary Angiography;  Surgeon: Belva Crome, MD;  Location: [place] [facility];  Service: Cardiovascular;  Laterality: N/A;  . CARDIAC CATHETERIZATION N/A 01/27/2016   Procedure: Left Heart Cath and Coronary Angiography;  Surgeon: Sherren Mocha, MD;  Location: [place] [facility];  Service: Cardiovascular;  Laterality: N/A;  . CHOLECYSTECTOMY N/A 02/09/2016   Procedure: LAPAROSCOPIC CHOLECYSTECTOMY WITH INTRAOPERATIVE CHOLANGIOGRAM;  Surgeon: Erroll Luna, MD;  Location: [place];  Service: General;  Laterality: N/A;  . COLONOSCOPY W/ POLYPECTOMY  08/2013   Avg risk screening, Dr Thomasenia Sales. 5 mm tubular adenoma ascending.  Mild to moderated descending and sigmoid tics.  Internal hemorrhoids  . CORONARY ANGIOPLASTY WITH STENT PLACEMENT    . ERCP N/A 02/13/2016   Procedure: ENDOSCOPIC RETROGRADE CHOLANGIOPANCREATOGRAPHY (ERCP);  Surgeon: Doran Stabler, MD;  Location: [place];  Service: Gastroenterology;  Laterality: N/A;  . ROTATOR CUFF REPAIR Right 2009  . ROTATOR CUFF REPAIR Left 2014  . TRIGGER FINGER RELEASE  2013   4 fingers    Medications  Current Outpatient Medications on File Prior to Visit  Medication Sig Dispense Refill  . acetaminophen (TYLENOL) 500 MG tablet Take 1,000 mg by mouth every 6 (six) hours as needed for headache (pain).    Marland Kitchen amLODipine (NORVASC) 5 MG tablet TAKE 1 TABLET EVERY DAY 90 tablet 3  . aspirin EC 81 MG tablet Take 81 mg by mouth daily.    Marland Kitchen atorvastatin (LIPITOR) 80 MG tablet TAKE  1  TABLET  AT  BEDTIME 90 tablet 3  . CINNAMON PO Take 1,000 mg by mouth daily.    . Coenzyme Q10 (COQ10) 100 MG CAPS Take 1 capsule by mouth daily.    Javier Docker Oil 500 MG CAPS Take 500 mg by mouth 2 (two) times daily.    . metoprolol tartrate (LOPRESSOR) 25 MG tablet TAKE 1 TABLET EVERY DAY 90 tablet 0  . Multiple Vitamin (MULTIVITAMIN WITH MINERALS) TABS tablet Take 1 tablet by mouth daily.    . nitroGLYCERIN (NITROSTAT) 0.4 MG SL tablet Place 1 tablet (0.4 mg total) under the tongue every 5 (five) minutes as needed for chest pain. 25 tablet 0  . ramipril (ALTACE) 10 MG capsule TAKE 1 CAPSULE TWICE DAILY 180 capsule 0  . tadalafil (ADCIRCA/CIALIS) 20 MG tablet Take 1 tablet (  20 mg total) by mouth daily as needed for erectile dysfunction. 30 tablet 0  . traZODone (DESYREL) 50 MG tablet TAKE 1 TABLET AT BEDTIME AS NEEDED FOR  SLEEP 90 tablet 1   Allergies No Known Allergies  Family History Family History  Problem Relation Age of Onset  . CAD Brother 38       Died age 35  . CAD Sister 68  . Heart disease Father 76       Valve replacement and CAD, CABG  . Dementia Mother   . Colon cancer Neg Hx     Review of Systems: Constitutional:  no fevers or chills Eye:  no recent significant change in vision Ear/Nose/Mouth/Throat:  Ears:  no recent hearing loss Nose/Mouth/Throat:  no complaints of nasal congestion or sore  throat Cardiovascular:  no chest pain, no palpitations Respiratory:  no cough and no shortness of breath Gastrointestinal:  no abdominal pain, no change in bowel habits GU:  Male: negative for dysuria, frequency, and incontinence and positive for prostate symptoms Musculoskeletal/Extremities:  no pain, redness, or swelling of the joints Integumentary (Skin):  no abnormal skin lesions reported Neurologic:  no headaches, Endocrine:  No unexpected weight changes Psych: +irritability (per spouse)  Exam BP 132/90 (BP Location: Left Arm, Patient Position: Sitting, Cuff Size: Normal)   Pulse 74   Temp 98 F (36.7 C) (Oral)   Ht 5\' 4"  (1.626 m)   Wt 173 lb 2 oz (78.5 kg)   SpO2 97%   BMI 29.72 kg/m  General:  well developed, well nourished, in no apparent distress Skin:  no significant moles, warts, or growths Head:  no masses, lesions, or tenderness Eyes:  pupils equal and round, sclera anicteric without injection Ears:  canals without lesions, TMs shiny without retraction, no obvious effusion, no erythema Nose:  nares patent, septum midline, mucosa normal Throat/Pharynx:  lips and gingiva without lesion; tongue and uvula midline; non-inflamed pharynx; no exudates or postnasal drainage Neck: neck supple without adenopathy, thyromegaly, or masses Lungs:  clear to auscultation, breath sounds equal bilaterally, no respiratory distress Cardio:  regular rate and rhythm, no LE edema or bruits Abdomen:  abdomen soft, nontender; bowel sounds normal; no masses or organomegaly Genital (male): circumcised penis, no lesions or discharge; testes present bilaterally without masses or tenderness Rectal: Deferred Musculoskeletal:  symmetrical muscle groups noted without atrophy or deformity Extremities:  no clubbing, cyanosis, or edema, no deformities, no skin discoloration Neuro:  gait normal; deep tendon reflexes normal and symmetric Psych: well oriented with normal range of affect and appropriate  judgment/insight  Assessment and Plan  Well adult exam - Plan: Comprehensive metabolic panel, Lipid panel  Screening for malignant neoplasm of prostate - Plan: PSA  Non-insulin dependent type 2 diabetes mellitus (HCC) - Plan: Hemoglobin A1c, HM DIABETES FOOT EXAM   Well 67 y.o. male. Counseled on diet and exercise. Counseled on risks and benefits of prostate cancer screening w PSA. Pt agrees to undergo testing. Other orders as above. Will be discussing issues with mood at next visit if still an issue. Follow up in 6 mo or prn.  The patient voiced understanding and agreement to the plan.  [place], DO 05/11/18 11:39 AM

## 2018-05-15 ENCOUNTER — Telehealth: Payer: Self-pay

## 2018-05-15 NOTE — Telephone Encounter (Signed)
Patient informed and he will wait until his new plan starts then decide what to do.

## 2018-05-15 NOTE — Telephone Encounter (Signed)
Pt's plan does not cover [employer] supplies- received PA request- deleted. Will need Pt to find out which products his insurance does cover.

## 2018-05-23 IMAGING — US US ABDOMEN LIMITED
1 series · 13 of 25 positions shown · non-contrast
Comparison: None.

CLINICAL DATA: Intermittent right upper quadrant and mid abdominal
pain for 3 days with nausea, vomiting and diarrhea.

EXAM:
US ABDOMEN LIMITED - RIGHT UPPER QUADRANT

[Series 1: us abdomen limited · 0.24mm/px · 13 of 84 slices shown]
[im 1/84]
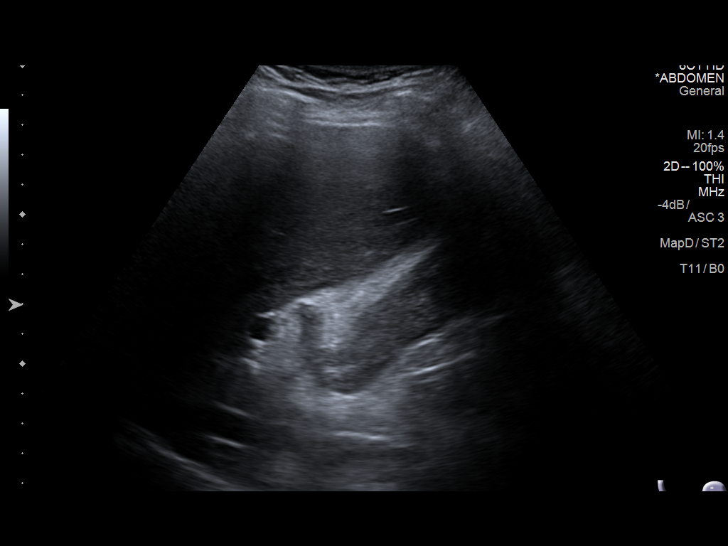
[im 7/84]
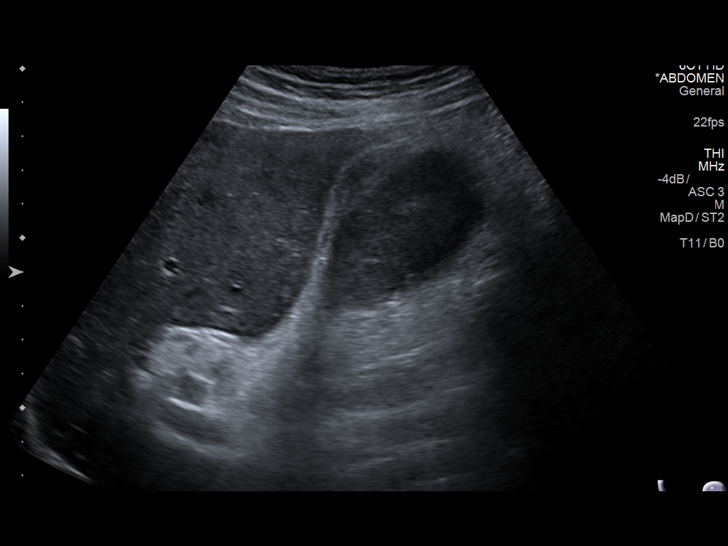
[im 14/84]
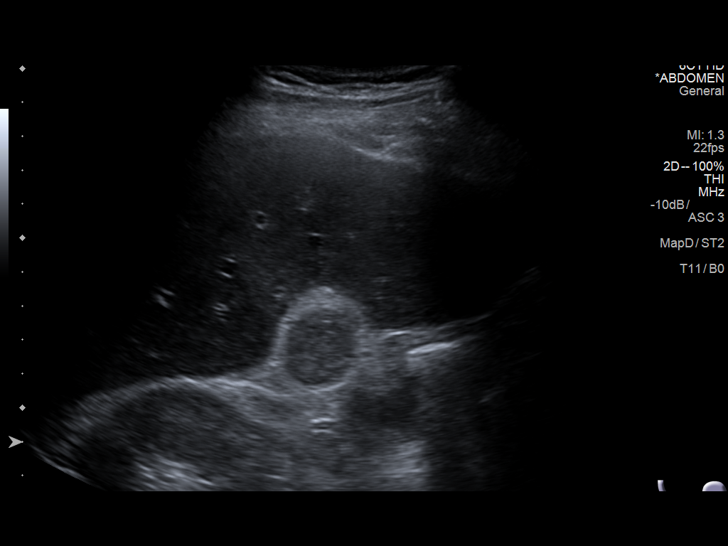
[im 21/84]
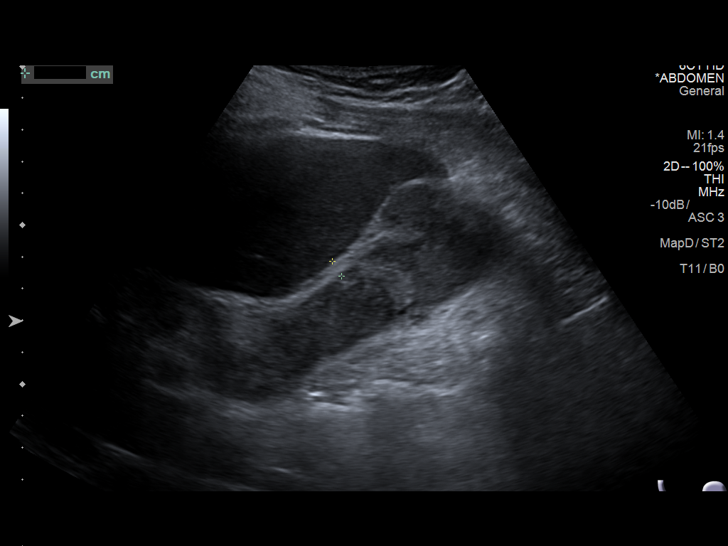
[im 28/84]
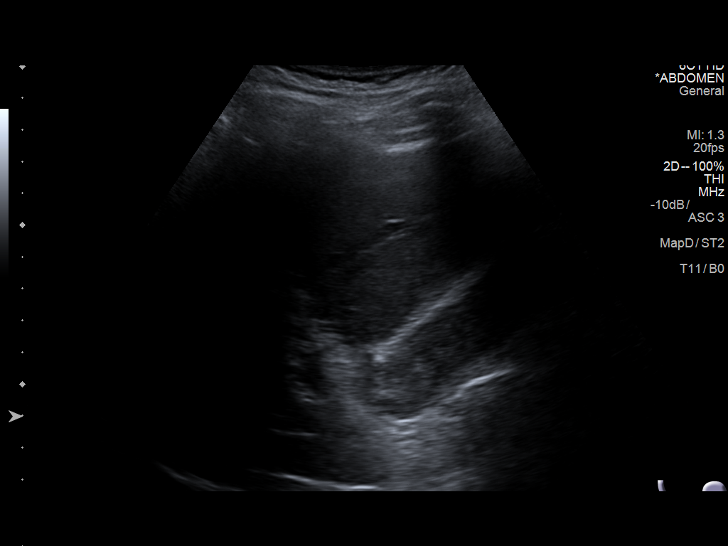
[im 35/84]
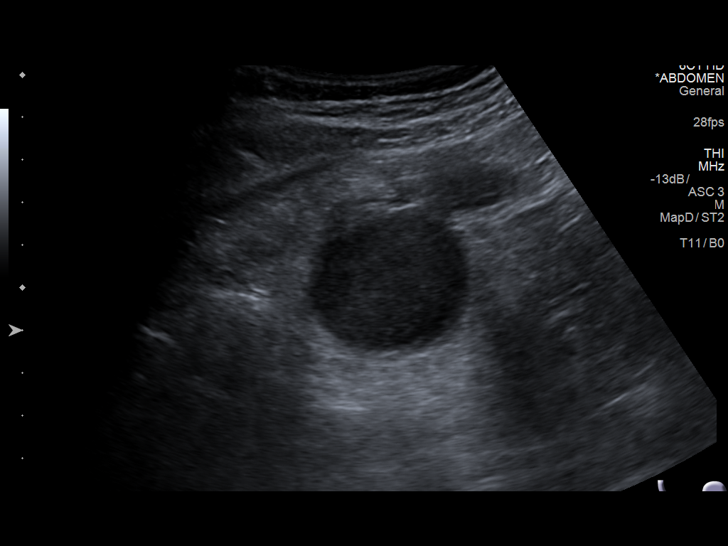
[im 42/84]
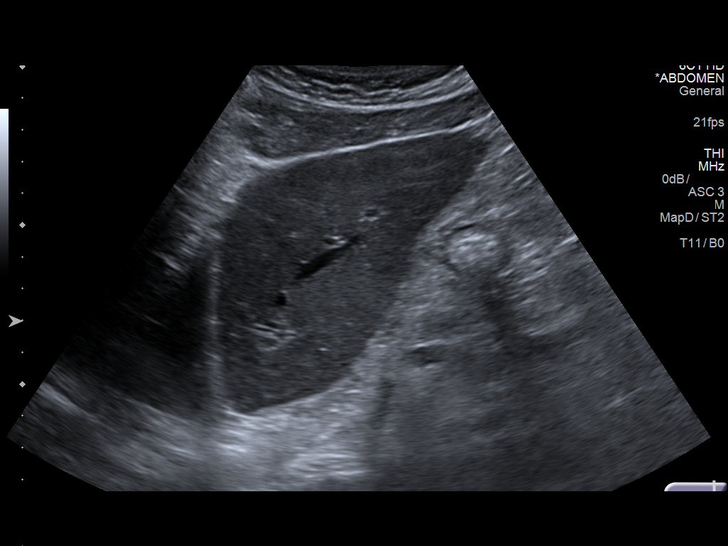
[im 49/84]
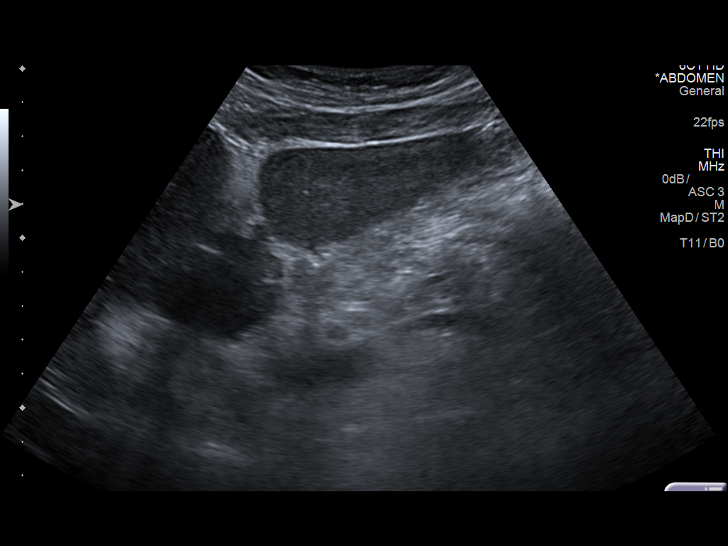
[im 56/84]
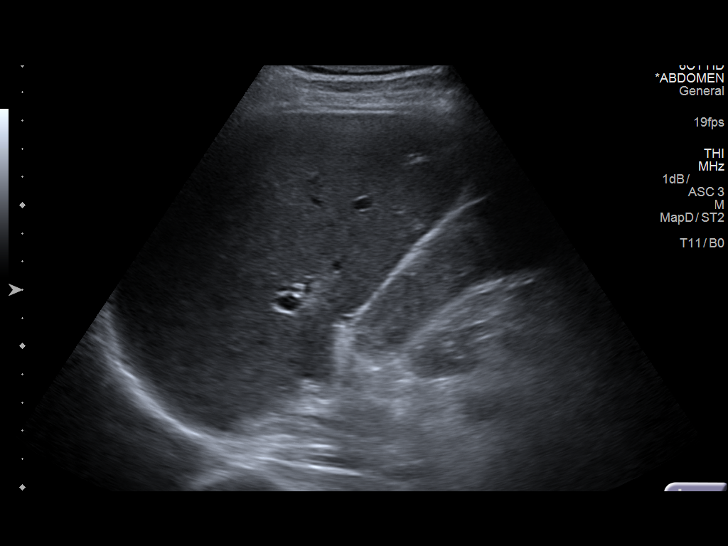
[im 63/84]
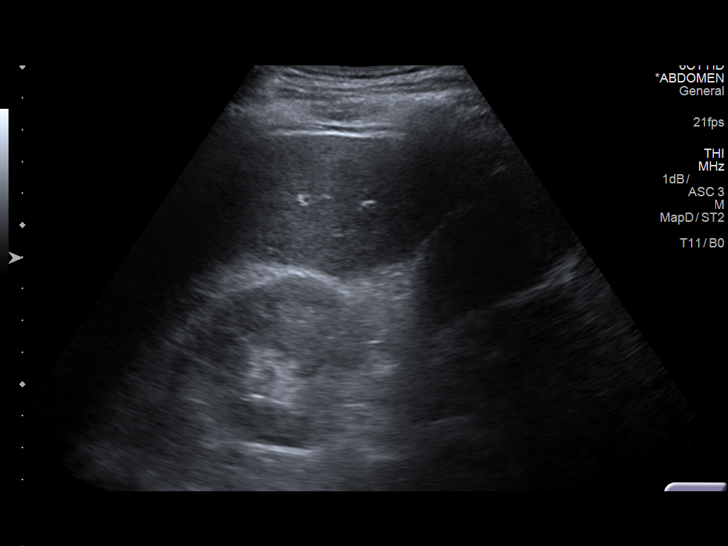
[im 70/84]
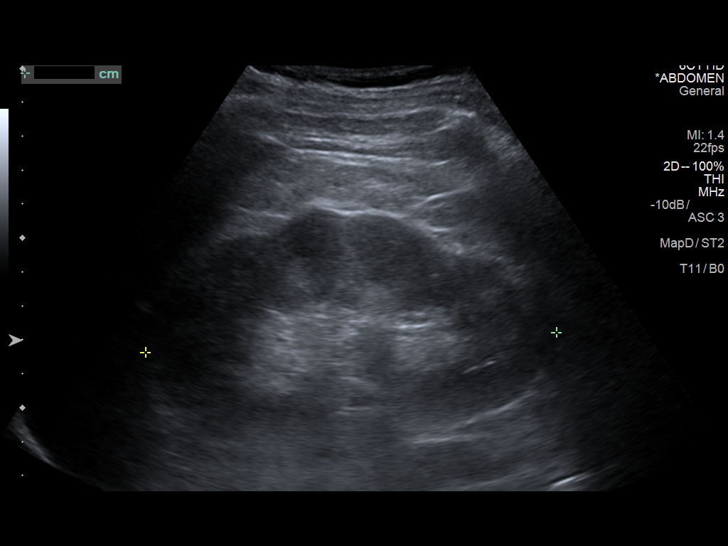
[im 77/84]
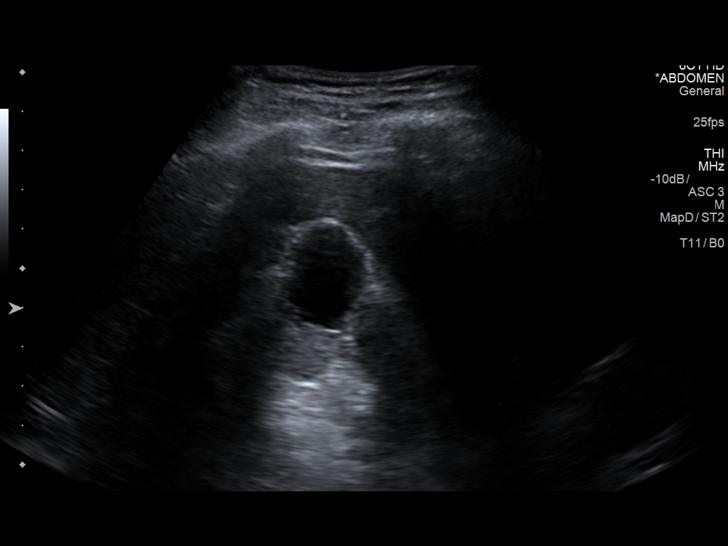
[im 84/84]
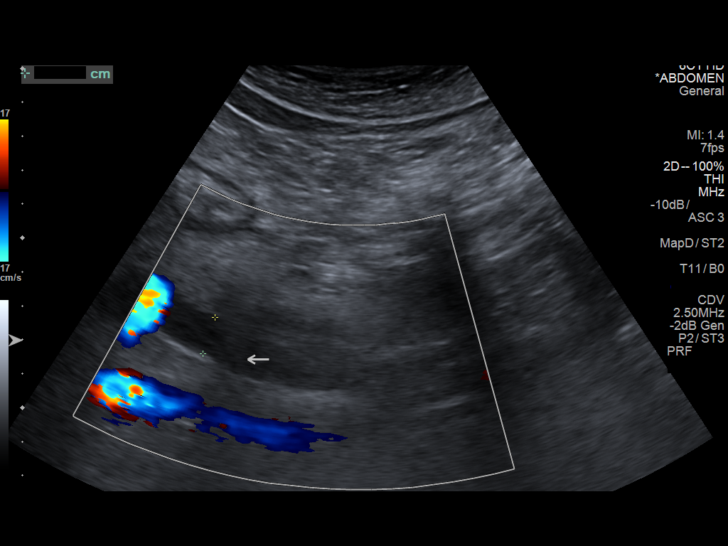

[13 of 25 positions shown; findings below may reference images not displayed]

FINDINGS: Gallbladder:

Mildly distended gallbladder is completely filled with mixed
echogenicity material, likely a combination of sludge and
gallstones, with the suggestion of a 1.4 cm gallstone in the
gallbladder neck. Mild diffuse gallbladder wall thickening. No
pericholecystic fluid. Sonographic Murphy sign is present.

Common bile duct:

Diameter: 13 mm. No choledocholithiasis demonstrated in the
visualized proximal common bile duct with nonvisualization of the
lower common bile duct due to overlying bowel gas.

Liver:

No focal lesion identified. Within normal limits in parenchymal
echogenicity. Incidental simple 2.8 x 2.5 x 2.0 cm renal cyst in the
interpolar right kidney.
IMPRESSION: 1. Mildly distended gallbladder completely filled with sludge and
gallstones with 1.4 cm gallstone in the gallbladder neck. Mild
diffuse gallbladder wall thickening with a sonographic Murphy sign
present per the technologist. Sonographic findings are consistent
with acute cholecystitis in the correct clinical setting.
2. Dilated common bile duct, 13 mm diameter. Lower common bile duct
not visualized due to overlying bowel gas. Obstructing
choledocholithiasis not excluded. Correlate with serum bilirubin
levels. Consider correlation with MRI abdomen/MRCP without and with
IV contrast.
3. Normal liver.

## 2018-05-23 NOTE — Progress Notes (Signed)
HPI: FU CAD. Previously followed in [facility]. Patient had a non-ST elevation myocardial infarction in 2002. Cardiac catheterization revealed an 80% proximal right coronary artery followed by an 85%. There was a 90% circumflex. LV function normal. Patient had PCI of his circumflex and right coronary artery. Nuclear study in July of 2012 showed an ejection fraction of 76%. No ischemia.  Admitted 7/16 with a NSTEMI. LHC demonstrated pCFX 95% ISR which was treated with a DES. Residual CAD included mLAD 40%, pRCA stent ok with 25% ISR, mRCA 55%, dRCA 45%, RPDA 30%. EF was preserved.Repeat catheterization August 2017 secondary to chest pain revealed moderate coronary disease without significant obstruction. Medical therapy recommended. LV function normal.Head CT showed possible normal pressure hydrocephalus but neurology did not think further therapy indicated. Patient had ERCP with sphincterotomy for choledocholithiasis in August 2017. Abd ultrasound 11/18 showed ectatic aorta (2.6 cm). FU recommended 5 years.  Since last seen, the patient denies any dyspnea on exertion, orthopnea, PND, pedal edema, palpitations, syncope or chest pain.   Current Outpatient Medications  Medication Sig Dispense Refill  . acetaminophen (TYLENOL) 500 MG tablet Take 1,000 mg by mouth every 6 (six) hours as needed for headache (pain).    Marland Kitchen amLODipine (NORVASC) 5 MG tablet TAKE 1 TABLET EVERY DAY 90 tablet 3  . aspirin EC 81 MG tablet Take 81 mg by mouth daily.    Marland Kitchen atorvastatin (LIPITOR) 80 MG tablet TAKE  1  TABLET  AT  BEDTIME 90 tablet 3  . CINNAMON PO Take 1,000 mg by mouth daily.    . Coenzyme Q10 (COQ10) 100 MG CAPS Take 1 capsule by mouth daily.    Marland Kitchen glucose blood (ONETOUCH VERIO) test strip Use once a week to check blood sugar 100 each 0  . Krill Oil 500 MG CAPS Take 500 mg by mouth 2 (two) times daily.    . metoprolol tartrate (LOPRESSOR) 25 MG tablet TAKE 1 TABLET EVERY DAY 90 tablet 0  .  Multiple Vitamin (MULTIVITAMIN WITH MINERALS) TABS tablet Take 1 tablet by mouth daily.    . nitroGLYCERIN (NITROSTAT) 0.4 MG SL tablet Place 1 tablet (0.4 mg total) under the tongue every 5 (five) minutes as needed for chest pain. 25 tablet 0  . ramipril (ALTACE) 10 MG capsule TAKE 1 CAPSULE TWICE DAILY 180 capsule 0  . RELION LANCETS MICRO-THIN 33G MISC Use once a week to check blood sugar 100 each 0  . tadalafil (ADCIRCA/CIALIS) 20 MG tablet Take 1 tablet (20 mg total) by mouth daily as needed for erectile dysfunction. 30 tablet 0  . traZODone (DESYREL) 50 MG tablet TAKE 1 TABLET AT BEDTIME AS NEEDED FOR  SLEEP 90 tablet 1   No current facility-administered medications for this visit.      Past Medical History:  Diagnosis Date  . CAD (coronary artery disease)    a. Cath 06/08/01 at [facility] with normal LM and LAD, 95% LCx s/p Circumflex stent 4.0 x 15 mm Penta and 85% and 80% prox RCA s/p 3.5 x 23 mm Penta b. cath 01/20/2015 95% prox LCx ISR treated with DES, 55% prox to mid RCA, 45% distal RCA, 40% midLAD, EF 55%  . Diabetes mellitus without complication ([place])   . Erectile dysfunction   . Hyperlipidemia   . Hypertension     Past Surgical History:  Procedure Laterality Date  . ANGIOPLASTY  2002   2 stents  . CARDIAC CATHETERIZATION N/A 01/20/2015   Procedure: Left  Heart Cath and Coronary Angiography;  Surgeon: Belva Crome, MD;  Location: [place] [facility];  Service: Cardiovascular;  Laterality: N/A;  . CARDIAC CATHETERIZATION N/A 01/27/2016   Procedure: Left Heart Cath and Coronary Angiography;  Surgeon: Sherren Mocha, MD;  Location: [place] [facility];  Service: Cardiovascular;  Laterality: N/A;  . CHOLECYSTECTOMY N/A 02/09/2016   Procedure: LAPAROSCOPIC CHOLECYSTECTOMY WITH INTRAOPERATIVE CHOLANGIOGRAM;  Surgeon: Erroll Luna, MD;  Location: [place];  Service: General;  Laterality: N/A;  . COLONOSCOPY W/ POLYPECTOMY  08/2013   Avg risk screening, Dr Thomasenia Sales. 5 mm tubular  adenoma ascending.  Mild to moderated descending and sigmoid tics.  Internal hemorrhoids  . CORONARY ANGIOPLASTY WITH STENT PLACEMENT    . ERCP N/A 02/13/2016   Procedure: ENDOSCOPIC RETROGRADE CHOLANGIOPANCREATOGRAPHY (ERCP);  Surgeon: Doran Stabler, MD;  Location: [place];  Service: Gastroenterology;  Laterality: N/A;  . ROTATOR CUFF REPAIR Right 2009  . ROTATOR CUFF REPAIR Left 2014  . TRIGGER FINGER RELEASE  2013   4 fingers    Social History   Socioeconomic History  . Marital status: Significant Other    Spouse name: Not on file  . Number of children: 2  . Years of education: 28  . Highest education level: Not on file  Occupational History  . 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 Financial resource strain: Not on file  . Food insecurity:    Worry: Not on file    Inability: Not on file  . Transportation needs:    Medical: Not on file    Non-medical: Not on file  Tobacco Use  . Smoking status: Former Smoker    Packs/day: 1.00    Years: 44.00    Pack years: 44.00    Types: Cigarettes    Last attempt to quit: 01/17/2015    Years since quitting: 3.3  . Smokeless tobacco: Never Used  Substance and Sexual Activity  . Alcohol use: Yes    Comment: daily  . Drug use: No  . Sexual activity: Yes  Lifestyle  . Physical activity:    Days per week: Not on file    Minutes per session: Not on file  . Stress: Not on file  Relationships  . Social connections:    Talks on phone: Not on file    Gets together: Not on file    Attends religious service: Not on file    Active member of club or organization: Not on file    Attends meetings of clubs or organizations: Not on file    Relationship status: Not on file  . Intimate partner violence:    Fear of current or ex partner: Not on file    Emotionally abused: Not on file    Physically abused: Not on file    Forced sexual activity: Not on file  Other Topics Concern  . Not on file  Social  History Narrative   Fun: Boat, golf   Denies religious beliefs effecting health care.     Family History  Problem Relation Age of Onset  . CAD Brother 53       Died age 26  . CAD Sister 60  . Heart disease Father 43       Valve replacement and CAD, CABG  . Dementia Mother   . Colon cancer Neg Hx     ROS: no fevers or chills, productive cough, hemoptysis, dysphasia, odynophagia, melena, hematochezia, dysuria, hematuria, rash, seizure activity,  orthopnea, PND, pedal edema, claudication. Remaining systems are negative.  Physical Exam: Well-developed well-nourished in no acute distress.  Skin is warm and dry.  HEENT is normal.  Neck is supple.  Chest is clear to auscultation with normal expansion.  Cardiovascular exam is regular rate and rhythm.  Abdominal exam nontender or distended. No masses palpated. Extremities show no edema. neuro grossly intact  ECG-normal sinus rhythm at a rate of 67.  No ST changes.  Personally reviewed  A/P  1 coronary artery disease-patient continues to do well from a symptomatic standpoint with no chest pain or dyspnea.  Continue medical therapy with aspirin and statin.  2 hypertension-patient's blood pressure is controlled.  Continue present medications and follow.  3 hyperlipidemia-continue statin.  Lab stories from November 2019 showed LDL 61.  Liver function is normal.  4 ectatic abdominal aorta-patient will need follow-up ultrasound November 2023.  Kirk Ruths, MD

## 2018-05-31 ENCOUNTER — Encounter: Payer: Self-pay | Admitting: Cardiology

## 2018-05-31 ENCOUNTER — Ambulatory Visit: Payer: Medicare HMO | Admitting: Cardiology

## 2018-05-31 VITALS — BP 138/78 | HR 67 | Ht 64.0 in | Wt 171.0 lb

## 2018-05-31 DIAGNOSIS — I10 Essential (primary) hypertension: Secondary | ICD-10-CM | POA: Diagnosis not present

## 2018-05-31 DIAGNOSIS — I714 Abdominal aortic aneurysm, without rupture, unspecified: Secondary | ICD-10-CM

## 2018-05-31 DIAGNOSIS — E7800 Pure hypercholesterolemia, unspecified: Secondary | ICD-10-CM | POA: Diagnosis not present

## 2018-05-31 DIAGNOSIS — I2510 Atherosclerotic heart disease of native coronary artery without angina pectoris: Secondary | ICD-10-CM | POA: Diagnosis not present

## 2018-05-31 NOTE — Patient Instructions (Signed)
Your physician recommends that you schedule a follow-up appointment in: ONE YEAR WITH DR CRENSHAW PLEASE GIVE OUR OFFICE A CALL 2 MONTHS PRIOR TO THAT APPOINTMENT TIME TO SCHEDULE   

## 2018-08-18 ENCOUNTER — Other Ambulatory Visit: Payer: Self-pay

## 2018-08-18 ENCOUNTER — Encounter: Payer: Self-pay | Admitting: Gastroenterology

## 2018-08-18 ENCOUNTER — Other Ambulatory Visit: Payer: Self-pay | Admitting: Family Medicine

## 2018-08-18 DIAGNOSIS — I2510 Atherosclerotic heart disease of native coronary artery without angina pectoris: Secondary | ICD-10-CM

## 2018-08-18 DIAGNOSIS — I10 Essential (primary) hypertension: Secondary | ICD-10-CM

## 2018-08-18 DIAGNOSIS — G4700 Insomnia, unspecified: Secondary | ICD-10-CM

## 2018-08-18 MED ORDER — TRAZODONE HCL 50 MG PO TABS: 50.0000 mg | ORAL_TABLET | Freq: Every evening | ORAL | 1 refills | Status: DC | PRN

## 2018-08-18 MED ORDER — ATORVASTATIN CALCIUM 80 MG PO TABS: 80.0000 mg | ORAL_TABLET | Freq: Every day | ORAL | 0 refills | Status: DC

## 2018-08-18 MED ORDER — AMLODIPINE BESYLATE 5 MG PO TABS: 5.0000 mg | ORAL_TABLET | Freq: Every day | ORAL | 0 refills | Status: DC

## 2018-08-18 MED ORDER — METOPROLOL TARTRATE 25 MG PO TABS: 25.0000 mg | ORAL_TABLET | Freq: Every day | ORAL | 3 refills | Status: DC

## 2018-08-18 MED ORDER — RAMIPRIL 10 MG PO CAPS: 10.0000 mg | ORAL_CAPSULE | Freq: Two times a day (BID) | ORAL | 3 refills | Status: DC

## 2018-08-18 NOTE — Telephone Encounter (Signed)
  1. Which medications need to be refilled? (please list name of each medication and dose if known) metoprolol and ramipril  2. Which pharmacy/location (including street and city if local pharmacy) is medication to be sent to? [facility] Fax [phone]  3. Do they need a 30 day or 90 day supply? [place]

## 2018-08-18 NOTE — Telephone Encounter (Signed)
Rx(s) sent to pharmacy electronically.  

## 2018-08-31 ENCOUNTER — Encounter: Payer: Self-pay | Admitting: Gastroenterology

## 2018-09-08 ENCOUNTER — Telehealth: Payer: Self-pay | Admitting: Gastroenterology

## 2018-09-08 NOTE — Telephone Encounter (Signed)
Pt states he has what he feels is a hemorrhoid and has some rectal bleeding that he feels is from the hemorrhoid. Pt was originally due for colon this year but due to guideline change is now due in 2022. Pt would like to see Dr. Loletha Carrow to discuss the bleeding. Pt scheduled to see Dr. Loletha Carrow 10/10/18@1 :45pm. Pt aware of appt.

## 2018-09-08 NOTE — Telephone Encounter (Signed)
Pt requested a CB to discuss the new recall letter and new guidelines.

## 2018-10-10 ENCOUNTER — Ambulatory Visit: Payer: Medicare HMO | Admitting: Gastroenterology

## 2018-10-11 ENCOUNTER — Encounter: Payer: Self-pay | Admitting: Family Medicine

## 2018-10-19 ENCOUNTER — Other Ambulatory Visit: Payer: Self-pay | Admitting: Family Medicine

## 2018-10-19 DIAGNOSIS — I10 Essential (primary) hypertension: Secondary | ICD-10-CM

## 2018-10-19 DIAGNOSIS — I2510 Atherosclerotic heart disease of native coronary artery without angina pectoris: Secondary | ICD-10-CM

## 2018-11-09 ENCOUNTER — Ambulatory Visit: Payer: Medicare HMO | Admitting: Family Medicine

## 2018-11-09 ENCOUNTER — Ambulatory Visit: Payer: Medicare HMO | Admitting: Gastroenterology

## 2018-11-10 ENCOUNTER — Other Ambulatory Visit: Payer: Self-pay

## 2018-11-10 ENCOUNTER — Encounter: Payer: Self-pay | Admitting: Family Medicine

## 2018-11-10 ENCOUNTER — Ambulatory Visit (INDEPENDENT_AMBULATORY_CARE_PROVIDER_SITE_OTHER): Payer: Medicare Other | Admitting: Family Medicine

## 2018-11-10 DIAGNOSIS — K219 Gastro-esophageal reflux disease without esophagitis: Secondary | ICD-10-CM | POA: Diagnosis not present

## 2018-11-10 DIAGNOSIS — E119 Type 2 diabetes mellitus without complications: Secondary | ICD-10-CM | POA: Diagnosis not present

## 2018-11-10 DIAGNOSIS — I10 Essential (primary) hypertension: Secondary | ICD-10-CM | POA: Diagnosis not present

## 2018-11-10 DIAGNOSIS — G4700 Insomnia, unspecified: Secondary | ICD-10-CM | POA: Insufficient documentation

## 2018-11-10 DIAGNOSIS — M79642 Pain in left hand: Secondary | ICD-10-CM | POA: Diagnosis not present

## 2018-11-10 NOTE — Progress Notes (Signed)
Subjective:   Chief Complaint  Patient presents with  . Follow-up    6 month    Troy Boyer is a 68 y.o. male here for follow-up of diabetes. Due to COVID-19 pandemic, we are interacting via web portal for an electronic face-to-face visit. I verified patient's ID using 2 identifiers. Patient agreed to proceed with visit via this method. Patient is at home, I am at office. Patient and I are present for visit.    Troy Boyer's self monitored glucose range is low 100s.  Patient denies hypoglycemic reactions. He checks his glucose levels intermittently Patient does not require insulin.   Medications include: diet controlled Exercise: walking  Hypertension Patient presents for hypertension follow up. He does monitor home blood pressures. Blood pressures ranging on average from 130's/70-80's. He is compliant with medications- Norvasc 5 mg/d, Altace 10 mg bid, . Patient has these side effects of medication: none He is adhering to a healthy diet overall. Exercise: walking  Hurt hand 2 mo ago while twisting a drill. No bruising or welling. Intermittent pain when he uses it. No neurologic s/s's. NSAIDs help for a bit.  Gets indigestion intermittently. [place] foods/garlic seem to be triggers. No bleeding/N/V/D, fevers, no wt loss.   Past Medical History:  Diagnosis Date  . Adenomatous colon polyp    tubular  . CAD (coronary artery disease)    a. Cath 06/08/01 at [facility] with normal LM and LAD, 95% LCx s/p Circumflex stent 4.0 x 15 mm Penta and 85% and 80% prox RCA s/p 3.5 x 23 mm Penta b. cath 01/20/2015 95% prox LCx ISR treated with DES, 55% prox to mid RCA, 45% distal RCA, 40% midLAD, EF 55%  . Diabetes mellitus without complication ([place])   . Diverticulosis   . Erectile dysfunction   . Hyperlipidemia   . Hypertension      Related testing: Date of retinal exam: Due for Pneumovax: done Flu Shot: done  Review of Systems: Pulmonary:  No SOB Cardiovascular:  No chest  pain  Objective:  No conversational dyspnea Age appropriate judgment and insight Nml affect and mood  Assessment:   Non-insulin dependent type 2 diabetes mellitus ([place]) - Plan: Hemoglobin A1c, Lipid panel, Comprehensive metabolic panel  Essential hypertension  Pain of left hand  Gastroesophageal reflux disease, esophagitis presence not specified   Plan:   Orders as above. Counseled on diet and exercise. Sounds like a strain of thenar eminence on L. Stretches/exercises, ice, heat.  Pepcid bid prn. F/u in 6 mo for CPE, labs at earliest convenience. The patient voiced understanding and agreement to the plan.  [place], DO 11/10/18 2:07 PM

## 2018-11-14 ENCOUNTER — Ambulatory Visit: Payer: Medicare HMO | Admitting: Gastroenterology

## 2018-11-15 ENCOUNTER — Other Ambulatory Visit: Payer: Self-pay

## 2018-11-15 ENCOUNTER — Other Ambulatory Visit: Payer: Self-pay | Admitting: Family Medicine

## 2018-11-15 ENCOUNTER — Other Ambulatory Visit (INDEPENDENT_AMBULATORY_CARE_PROVIDER_SITE_OTHER): Payer: Medicare Other

## 2018-11-15 DIAGNOSIS — E782 Mixed hyperlipidemia: Secondary | ICD-10-CM

## 2018-11-15 DIAGNOSIS — E119 Type 2 diabetes mellitus without complications: Secondary | ICD-10-CM

## 2018-11-15 LAB — COMPREHENSIVE METABOLIC PANEL
ALT: 28 U/L (ref 0–53)
AST: 17 U/L (ref 0–37)
Albumin: 4.5 g/dL (ref 3.5–5.2)
Alkaline Phosphatase: 53 U/L (ref 39–117)
BUN: 13 mg/dL (ref 6–23)
CO2: 29 mEq/L (ref 19–32)
Calcium: 9.6 mg/dL (ref 8.4–10.5)
Chloride: 100 mEq/L (ref 96–112)
Creatinine, Ser: 0.78 mg/dL (ref 0.40–1.50)
GFR: 99.39 mL/min (ref >60.00)
Glucose, Bld: 143 mg/dL — ABNORMAL HIGH (ref 70–99)
Potassium: 4.4 mEq/L (ref 3.5–5.1)
Sodium: 138 mEq/L (ref 135–145)
Total Bilirubin: 0.6 mg/dL (ref 0.2–1.2)
Total Protein: 6.8 g/dL (ref 6.0–8.3)

## 2018-11-15 LAB — LIPID PANEL
Cholesterol: 134 mg/dL (ref 0–200)
HDL: 45.3 mg/dL (ref >39.00)
NonHDL: 89.1
Total CHOL/HDL Ratio: 3
Triglycerides: 227 mg/dL — ABNORMAL HIGH (ref 0.0–149.0)
VLDL: 45.4 mg/dL — ABNORMAL HIGH (ref 0.0–40.0)

## 2018-11-15 LAB — HEMOGLOBIN A1C: Hgb A1c MFr Bld: 7.2 % — ABNORMAL HIGH (ref 4.6–6.5)

## 2018-11-15 LAB — LDL CHOLESTEROL, DIRECT: Direct LDL: 60 mg/dL

## 2018-11-15 MED ORDER — FENOFIBRATE 48 MG PO TABS: 48.0000 mg | ORAL_TABLET | Freq: Every day | ORAL | 3 refills | Status: DC

## 2018-11-15 MED ORDER — FENOFIBRATE 48 MG PO TABS: 48.0000 mg | ORAL_TABLET | Freq: Every day | ORAL | 0 refills | Status: DC

## 2018-11-15 NOTE — Progress Notes (Signed)
la 

## 2018-11-18 ENCOUNTER — Encounter: Payer: Self-pay | Admitting: Family Medicine

## 2018-11-21 ENCOUNTER — Other Ambulatory Visit: Payer: Self-pay

## 2018-11-21 NOTE — Patient Outreach (Signed)
Troy Boyer) Care Boyer  11/21/2018  [name] 04/21/1951 [id number]   Medication Adherence call to Mr. Troy Boyer  [place] Identifiers Verify spoke with patient he is past due on Atorvastatin 80 mg and Ramipril patient has medication at this time and will order when he is due. Mr. Troy Boyer is showing past due under Troy Boyer.   Troy Boyer Direct Dial [phone]  Fax [phone] [email or web address]

## 2018-12-13 ENCOUNTER — Other Ambulatory Visit: Payer: Medicare Other

## 2018-12-20 ENCOUNTER — Other Ambulatory Visit (INDEPENDENT_AMBULATORY_CARE_PROVIDER_SITE_OTHER): Payer: Medicare Other

## 2018-12-20 ENCOUNTER — Other Ambulatory Visit: Payer: Self-pay

## 2018-12-20 DIAGNOSIS — E782 Mixed hyperlipidemia: Secondary | ICD-10-CM | POA: Diagnosis not present

## 2018-12-20 LAB — LIPID PANEL
Cholesterol: 122 mg/dL (ref 0–200)
HDL: 37.8 mg/dL — ABNORMAL LOW (ref >39.00)
LDL Cholesterol: 46 mg/dL (ref 0–99)
NonHDL: 84.68
Total CHOL/HDL Ratio: 3
Triglycerides: 192 mg/dL — ABNORMAL HIGH (ref 0.0–149.0)
VLDL: 38.4 mg/dL (ref 0.0–40.0)

## 2018-12-28 ENCOUNTER — Other Ambulatory Visit: Payer: Self-pay

## 2018-12-28 NOTE — Patient Outreach (Signed)
[facility]) Care Management  12/28/2018  Taegan Standage 11/01/1950 [id number]   Medication Adherence call to Mr. [place] Compliant Voice message left with a call back number. Mr. Primmer is showing past due on Atorvastatin 80 mg and Ramipril 10 mg under [place].   [place] Management Direct Dial [phone]  Fax [phone] [email or web address]

## 2019-01-01 ENCOUNTER — Encounter: Payer: Self-pay | Admitting: Family Medicine

## 2019-01-16 ENCOUNTER — Other Ambulatory Visit: Payer: Self-pay | Admitting: Family Medicine

## 2019-02-01 ENCOUNTER — Other Ambulatory Visit: Payer: Self-pay | Admitting: Family Medicine

## 2019-02-01 DIAGNOSIS — G4700 Insomnia, unspecified: Secondary | ICD-10-CM

## 2019-02-15 ENCOUNTER — Other Ambulatory Visit: Payer: Self-pay

## 2019-02-16 ENCOUNTER — Ambulatory Visit (INDEPENDENT_AMBULATORY_CARE_PROVIDER_SITE_OTHER): Payer: Medicare Other | Admitting: Family Medicine

## 2019-02-16 ENCOUNTER — Encounter: Payer: Self-pay | Admitting: Family Medicine

## 2019-02-16 ENCOUNTER — Other Ambulatory Visit: Payer: Self-pay | Admitting: Family Medicine

## 2019-02-16 VITALS — BP 112/72 | HR 77 | Temp 97.5°F | Ht 64.0 in | Wt 170.0 lb

## 2019-02-16 DIAGNOSIS — E119 Type 2 diabetes mellitus without complications: Secondary | ICD-10-CM | POA: Diagnosis not present

## 2019-02-16 DIAGNOSIS — I10 Essential (primary) hypertension: Secondary | ICD-10-CM

## 2019-02-16 DIAGNOSIS — E782 Mixed hyperlipidemia: Secondary | ICD-10-CM

## 2019-02-16 DIAGNOSIS — I2510 Atherosclerotic heart disease of native coronary artery without angina pectoris: Secondary | ICD-10-CM

## 2019-02-16 LAB — LIPID PANEL
Cholesterol: 155 mg/dL (ref 0–200)
HDL: 41.6 mg/dL (ref >39.00)
LDL Cholesterol: 87 mg/dL (ref 0–99)
NonHDL: 113.31
Total CHOL/HDL Ratio: 4
Triglycerides: 132 mg/dL (ref 0.0–149.0)
VLDL: 26.4 mg/dL (ref 0.0–40.0)

## 2019-02-16 LAB — HEMOGLOBIN A1C: Hgb A1c MFr Bld: 6.9 % — ABNORMAL HIGH (ref 4.6–6.5)

## 2019-02-16 MED ORDER — TADALAFIL 20 MG PO TABS: 20.0000 mg | ORAL_TABLET | Freq: Every day | ORAL | 2 refills | Status: DC | PRN

## 2019-02-16 NOTE — Patient Instructions (Signed)
Give us 2-3 business days to get the results of your labs back.   Keep the diet clean and stay active.  I recommend getting the flu shot in mid October. This suggestion would change if the CDC comes out with a different recommendation.   Let us know if you need anything. 

## 2019-02-16 NOTE — Progress Notes (Signed)
Subjective:   Chief Complaint  Patient presents with  . Follow-up    diabetes    Gregroy Lahaie is a 68 y.o. male here for follow-up of diabetes.   Jmari's self monitored glucose range is low-mid 100's. Patient denies hypoglycemic reactions. He checks his glucose levels daily. Patient does not require insulin.   Medications include: diet-controlled Diet is healthy overall Exercise: walking  Hyperlipidemia Patient presents for mixed hyperlipidemia follow up. Currently being treated with Lipitor 80 mg/d and Fenofibrate and compliance with treatment thus far has been good. He complains of myalgias. He is adhering to a healthy diet. Exercise: walking The patient is not known to have coexisting coronary artery disease.  Hypertension Patient presents for hypertension follow up. He does monitor home blood pressures. Blood pressures ranging on average from 120's/80's. He is compliant with medications. Patient has these side effects of medication: none He is adhering to a healthy diet overall. Exercise: walking    Past Medical History:  Diagnosis Date  . Adenomatous colon polyp    tubular  . CAD (coronary artery disease)    a. Cath 06/08/01 at [facility] with normal LM and LAD, 95% LCx s/p Circumflex stent 4.0 x 15 mm Penta and 85% and 80% prox RCA s/p 3.5 x 23 mm Penta b. cath 01/20/2015 95% prox LCx ISR treated with DES, 55% prox to mid RCA, 45% distal RCA, 40% midLAD, EF 55%  . Diabetes mellitus without complication ([place])   . Diverticulosis   . Erectile dysfunction   . Hyperlipidemia   . Hypertension      Related testing: Date of retinal exam: Has one scheduled for 8/24 Pneumovax: done  Review of Systems: Pulmonary:  No SOB Cardiovascular:  No chest pain  Objective:  BP 112/72 (BP Location: Left Arm, Patient Position: Sitting, Cuff Size: Normal)   Pulse 77   Temp (!) 97.5 F (36.4 C) (Temporal)   Ht 5\' 4"  (1.626 m)   Wt 170 lb (77.1 kg)   SpO2 94%   BMI  29.18 kg/m  General:  Well developed, well nourished, in no apparent distress Skin:  Warm, no pallor or diaphoresis Head:  Normocephalic, atraumatic Eyes:  Pupils equal and round, sclera anicteric without injection  Lungs:  CTAB, no access msc use Cardio:  RRR, no bruits, no LE edema Psych: Age appropriate judgment and insight  Assessment:   Non-insulin dependent type 2 diabetes mellitus (HCC) - Plan: Hemoglobin A1c  Mixed hyperlipidemia - Plan: Lipid panel  Essential hypertension   Plan:   Goal is <7.5. Counseled on diet and exercise. His eye exam is in 3 d.  Sounds like he is doing well with fibrate. Would consider Vascepa if not controlled on max statin and fenofibrate.  Cont BP meds.  F/u as originally scheduled for CPE. The patient voiced understanding and agreement to the plan.  [place], DO 02/16/19 11:38 AM

## 2019-02-19 DIAGNOSIS — H11442 Conjunctival cysts, left eye: Secondary | ICD-10-CM | POA: Diagnosis not present

## 2019-02-19 DIAGNOSIS — E119 Type 2 diabetes mellitus without complications: Secondary | ICD-10-CM | POA: Diagnosis not present

## 2019-02-19 DIAGNOSIS — H25813 Combined forms of age-related cataract, bilateral: Secondary | ICD-10-CM | POA: Diagnosis not present

## 2019-02-19 LAB — HM DIABETES EYE EXAM

## 2019-02-21 ENCOUNTER — Encounter: Payer: Self-pay | Admitting: Family Medicine

## 2019-02-28 ENCOUNTER — Encounter: Payer: Self-pay | Admitting: Family Medicine

## 2019-03-06 ENCOUNTER — Other Ambulatory Visit: Payer: Self-pay | Admitting: Family Medicine

## 2019-03-06 ENCOUNTER — Telehealth: Payer: Self-pay | Admitting: Family Medicine

## 2019-03-06 ENCOUNTER — Telehealth: Payer: Self-pay | Admitting: Gastroenterology

## 2019-03-06 DIAGNOSIS — R350 Frequency of micturition: Secondary | ICD-10-CM

## 2019-03-06 NOTE — Telephone Encounter (Signed)
referra done

## 2019-03-06 NOTE — Telephone Encounter (Signed)
Hi Dr. Loletha Carrow, this patient would like to continue his GI care with Dr. Havery Moros because his wife is a patient of his. It looks like you did a ERCP when he was hospitalized but never saw him in the office. Are you ok with the switch? Thank you.

## 2019-03-06 NOTE — Telephone Encounter (Signed)
OK to refer. Ty.  

## 2019-03-06 NOTE — Telephone Encounter (Signed)
Copied from [place] [id number]. Topic: Referral - Question >> Mar 06, 2019 12:36 PM Rayann Heman wrote: Reason for CRM: pt called and stated that Dr Nani Ravens requested a visit for a urology referral and wanted to make sure this was correct because they discussed referral on 02/16/19. Please see recent my chart message. Please advise

## 2019-03-12 ENCOUNTER — Encounter: Payer: Self-pay | Admitting: Family Medicine

## 2019-03-12 NOTE — Telephone Encounter (Signed)
Hi Dr. Havery Moros, please let me know if it is ok to schedule patient with you. Thank you.

## 2019-03-12 NOTE — Telephone Encounter (Signed)
Yes, of course, if that is the patient's preference and if acceptable to Dr. Havery Moros.

## 2019-03-13 ENCOUNTER — Encounter: Payer: Self-pay | Admitting: Gastroenterology

## 2019-03-13 NOTE — Telephone Encounter (Signed)
Pt scheduled for a consult with Dr. Havery Moros on 10/21 at 11:00am.

## 2019-03-13 NOTE — Telephone Encounter (Signed)
Okay with me if okay with Dr. Loletha Carrow. Thanks

## 2019-03-15 ENCOUNTER — Ambulatory Visit (INDEPENDENT_AMBULATORY_CARE_PROVIDER_SITE_OTHER): Payer: Medicare Other | Admitting: Family Medicine

## 2019-03-15 ENCOUNTER — Encounter: Payer: Self-pay | Admitting: Family Medicine

## 2019-03-15 ENCOUNTER — Other Ambulatory Visit: Payer: Self-pay

## 2019-03-15 ENCOUNTER — Ambulatory Visit: Payer: Medicare Other | Admitting: Family Medicine

## 2019-03-15 VITALS — BP 112/64 | HR 85 | Temp 97.3°F | Ht 64.0 in | Wt 172.5 lb

## 2019-03-15 DIAGNOSIS — R152 Fecal urgency: Secondary | ICD-10-CM | POA: Diagnosis not present

## 2019-03-15 DIAGNOSIS — N3941 Urge incontinence: Secondary | ICD-10-CM | POA: Diagnosis not present

## 2019-03-15 MED ORDER — MIRABEGRON ER 25 MG PO TB24: 25.0000 mg | ORAL_TABLET | Freq: Every day | ORAL | 2 refills | Status: DC

## 2019-03-15 NOTE — Progress Notes (Signed)
Chief Complaint  Patient presents with  . urinary problems    Subjective: Patient is a 68 y.o. male here for urinary issue. Here w his wife.  Over past 2 years, has had issues with urinary freq and urgency. Over past 1-2 weeks, it has gotten worse. If he has the urge to go, he will wet himself if he does not find a bathroom. He is not having pain, bleeding, constipation, discharge, no hx of UTI's. Drinks some caffeine, alcohol at night. Does not have nighttime freq.   Has fecal urgency also. Until 2 mo ago, after his gall bladder surg 2 yrs ago, he would have intermittent fecal incontinence if he could not quickly find a bathroom. He does have urgency now and incomplete emptying. Does not stay too well hydrated.   ROS: GU: +incontinence GI: As noted in HPI  Past Medical History:  Diagnosis Date  . Adenomatous colon polyp    tubular  . CAD (coronary artery disease)    a. Cath 06/08/01 at [facility] with normal LM and LAD, 95% LCx s/p Circumflex stent 4.0 x 15 mm Penta and 85% and 80% prox RCA s/p 3.5 x 23 mm Penta b. cath 01/20/2015 95% prox LCx ISR treated with DES, 55% prox to mid RCA, 45% distal RCA, 40% midLAD, EF 55%  . Diabetes mellitus without complication ([place])   . Diverticulosis   . Erectile dysfunction   . Hyperlipidemia   . Hypertension     Objective: BP 112/64 (BP Location: Left Arm, Patient Position: Sitting, Cuff Size: Normal)   Pulse 85   Temp (!) 97.3 F (36.3 C) (Temporal)   Ht 5\' 4"  (1.626 m)   Wt 172 lb 8 oz (78.2 kg)   SpO2 97%   BMI 29.61 kg/m  General: Awake, appears stated age Heart: RRR, no murmurs Lungs: CTAB, no rales, wheezes or rhonchi. No accessory muscle use Abd: BS hyperactive, S, NT, ND, no masses or organomegaly. Psych: Age appropriate judgment and insight, normal affect and mood  Assessment and Plan: Urge incontinence - Plan: mirabegron ER (MYRBETRIQ) 25 MG TB24 tablet, Urinalysis, Routine w reflex microscopic  Fecal urgency  1-  start Myrbetriq. Mind alcohol/caffine intake.  2- Metamucil daily. Has appt w GI next mo. If things get worse, he will let us know. F/u in 1 mo. The patient voiced understanding and agreement to the plan.  [place], DO 03/15/19  3:17 PM

## 2019-03-15 NOTE — Patient Instructions (Addendum)
Take Metamucil daily to see if that can help with your urgency.  Stay hydrated, don't drink too much caffeine or alcohol.   Let us know if you need anything.

## 2019-03-16 ENCOUNTER — Encounter: Payer: Self-pay | Admitting: Family Medicine

## 2019-03-16 LAB — URINALYSIS, ROUTINE W REFLEX MICROSCOPIC
Bilirubin Urine: NEGATIVE
Hgb urine dipstick: NEGATIVE
Ketones, ur: NEGATIVE
Leukocytes,Ua: NEGATIVE
Nitrite: NEGATIVE
RBC / HPF: NONE SEEN (ref 0–?)
Specific Gravity, Urine: >=1.03 — AB (ref 1.000–1.030)
Total Protein, Urine: NEGATIVE
Urine Glucose: 500 — AB
Urobilinogen, UA: 0.2 (ref 0.0–1.0)
WBC, UA: NONE SEEN (ref 0–?)
pH: 5.5 (ref 5.0–8.0)

## 2019-03-17 ENCOUNTER — Other Ambulatory Visit: Payer: Self-pay | Admitting: Family Medicine

## 2019-03-17 MED ORDER — OXYBUTYNIN CHLORIDE ER 10 MG PO TB24: 10.0000 mg | ORAL_TABLET | Freq: Every day | ORAL | 2 refills | Status: DC

## 2019-03-24 ENCOUNTER — Other Ambulatory Visit: Payer: Self-pay | Admitting: Family Medicine

## 2019-04-02 ENCOUNTER — Encounter: Payer: Self-pay | Admitting: Family Medicine

## 2019-04-13 ENCOUNTER — Other Ambulatory Visit: Payer: Self-pay

## 2019-04-13 ENCOUNTER — Encounter: Payer: Self-pay | Admitting: Family Medicine

## 2019-04-13 ENCOUNTER — Ambulatory Visit (INDEPENDENT_AMBULATORY_CARE_PROVIDER_SITE_OTHER): Payer: Medicare Other | Admitting: Family Medicine

## 2019-04-13 VITALS — BP 120/76 | HR 74 | Temp 96.9°F | Ht 64.0 in | Wt 172.2 lb

## 2019-04-13 DIAGNOSIS — E860 Dehydration: Secondary | ICD-10-CM | POA: Diagnosis not present

## 2019-04-13 DIAGNOSIS — Z23 Encounter for immunization: Secondary | ICD-10-CM

## 2019-04-13 DIAGNOSIS — R152 Fecal urgency: Secondary | ICD-10-CM

## 2019-04-13 DIAGNOSIS — N3941 Urge incontinence: Secondary | ICD-10-CM

## 2019-04-13 NOTE — Patient Instructions (Signed)
Stay hydrated.  You are due for your final pneumonia vaccine in 2022.   Let us know if you need anything.

## 2019-04-13 NOTE — Progress Notes (Signed)
Chief Complaint  Patient presents with  . Follow-up    medication    Subjective: Patient is a 68 y.o. male here for f/u.  Pt was having issues with urge incontinence and fecal urgency 1 mo ago. Was rec'd Myrbetriq and Metamucil respectively. Also was told he was likely dehydrated and to increase his water intake. He was unable to consistently take his medication 2/2 cramping, but notes his issues has gotten markedly better since increasing his water intake by around 20-30 oz.  His fecal urgency is also improved. Not 100%, has appt w GI team next week. He is not taking metamucil at this time.    ROS: GI: No incontinence  Past Medical History:  Diagnosis Date  . Adenomatous colon polyp    tubular  . CAD (coronary artery disease)    a. Cath 06/08/01 at [facility] with normal LM and LAD, 95% LCx s/p Circumflex stent 4.0 x 15 mm Penta and 85% and 80% prox RCA s/p 3.5 x 23 mm Penta b. cath 01/20/2015 95% prox LCx ISR treated with DES, 55% prox to mid RCA, 45% distal RCA, 40% midLAD, EF 55%  . Diabetes mellitus without complication ([place])   . Diverticulosis   . Erectile dysfunction   . Hyperlipidemia   . Hypertension     Objective: BP 120/76 (BP Location: Left Arm, Patient Position: Sitting, Cuff Size: Normal)   Pulse 74   Temp (!) 96.9 F (36.1 C) (Temporal)   Ht 5\' 4"  (1.626 m)   Wt 172 lb 4 oz (78.1 kg)   SpO2 97%   BMI 29.57 kg/m  General: Awake, appears stated age Heart: RRR Abd: BS+, S, NT, ND Lungs: CTAB, no rales, wheezes or rhonchi. No accessory muscle use Psych: Age appropriate judgment and insight, normal affect and mood  Assessment and Plan: Dehydration  Need for influenza vaccination - Plan: Flu Vaccine QUAD High Dose(Fluad)  Urge incontinence  Fecal urgency  Stay hydrated. OK to remain off of rx meds and Metamucil.  F/u as originally scheduled.  The patient voiced understanding and agreement to the plan.  [place], DO 04/13/19  11:22  AM

## 2019-04-18 ENCOUNTER — Ambulatory Visit (INDEPENDENT_AMBULATORY_CARE_PROVIDER_SITE_OTHER): Payer: Medicare Other | Admitting: Gastroenterology

## 2019-04-18 ENCOUNTER — Encounter: Payer: Self-pay | Admitting: Gastroenterology

## 2019-04-18 VITALS — BP 126/78 | HR 76 | Temp 98.3°F | Ht 64.5 in | Wt 170.2 lb

## 2019-04-18 DIAGNOSIS — K641 Second degree hemorrhoids: Secondary | ICD-10-CM

## 2019-04-18 DIAGNOSIS — Z86010 Personal history of colonic polyps: Secondary | ICD-10-CM

## 2019-04-18 MED ORDER — CITRUCEL PO POWD: 1.0000 | Freq: Every day | ORAL | Status: DC

## 2019-04-18 NOTE — Patient Instructions (Addendum)
If you are age 67 or older, your body mass index should be between 23-30. Your Body mass index is 28.77 kg/m. If this is out of the aforementioned range listed, please consider follow up with your Primary Care Provider.  If you are age 67 or younger, your body mass index should be between 19-25. Your Body mass index is 28.77 kg/m. If this is out of the aformentioned range listed, please consider follow up with your Primary Care Provider.   To help prevent the possible spread of infection to our patients, communities, and staff; we will be implementing the following measures:  As of now we are not allowing any visitors/family members to accompany you to any upcoming appointments with [facility]. If you have any concerns about this please contact our office to discuss prior to the appointment.   Use Citrucel powder, over-the-counter daily.   HEMORRHOID BANDING PROCEDURE    FOLLOW-UP CARE   1. The procedure you have had should have been relatively painless since the banding of the area involved does not have nerve endings and there is no pain sensation.  The rubber band cuts off the blood supply to the hemorrhoid and the band may fall off as soon as 48 hours after the banding (the band may occasionally be seen in the toilet bowl following a bowel movement). You may notice a temporary feeling of fullness in the rectum which should respond adequately to plain Tylenol or Motrin.  2. Following the banding, avoid strenuous exercise that evening and resume full activity the next day.  A sitz bath (soaking in a warm tub) or bidet is soothing, and can be useful for cleansing the area after bowel movements.     3. To avoid constipation, take two tablespoons of natural wheat bran, natural oat bran, flax, Benefiber or any over the counter fiber supplement and increase your water intake to 7-8 glasses daily.    4. Unless you have been prescribed anorectal medication, do not put anything  inside your rectum for two weeks: No suppositories, enemas, fingers, etc.  5. Occasionally, you may have more bleeding than usual after the banding procedure.  This is often from the untreated hemorrhoids rather than the treated one.  Don't be concerned if there is a tablespoon or so of blood.  If there is more blood than this, lie flat with your bottom higher than your head and apply an ice pack to the area. If the bleeding does not stop within a half an hour or if you feel faint, call our office at [phone] or go to the emergency room.  6. Problems are not common; however, if there is a substantial amount of bleeding, severe pain, chills, fever or difficulty passing urine (very rare) or other problems, you should call us at [phone] or report to the nearest emergency room.  7. Do not stay seated continuously for more than 2-3 hours for a day or two after the procedure.  Tighten your buttock muscles 10-15 times every two hours and take 10-15 deep breaths every 1-2 hours.  Do not spend more than a few minutes on the toilet if you cannot empty your bowel; instead re-visit the toilet at a later time.    You have your 2nd banding appointment with Dr. Havery Moros on Friday, 05-04-19 at 4:00pm. If you need to reschedule this appointment please call our office at [phone].   Thank you for entrusting me with your care and for choosing [facility], Dr.  Villanueva Cellar

## 2019-04-18 NOTE — Progress Notes (Signed)
HPI :  68 year old male known to our office from prior screening colonoscopy in 2015 and ERCP when hospitalized with choledocholithiasis in August 2017, here to establish his GI care with me.  He has a history of colon polyps, history of CAD, history of diabetes and gallstones.  His last colonoscopy was in March 2015.  He had a small adenoma removed at the time, good prep, diverticulosis, and internal hemorrhoids.  He states he has had issues with his hemorrhoids since the colonoscopy.  They bother him intermittently.  Main symptom appears to be bleeding, he will noticed blood on the toilet paper with wiping and sometimes on his underwear.  He denies any overt blood in the stools.  In recent months this is bothered him more than usual.  Occurs intermittently.  He had some regular bowel movements at times, tried Metamucil and did not like it.  He is increased his water intake significantly and feels like his bowels are moving more normally.  He denies any abdominal pains.  No weight loss.  No family history of colon cancer.  He is using Preparation H as needed which helps somewhat.  Is otherwise in good health and denies any other problems that are bothering him.  We discussed timing of his next colonoscopy.  He is requesting further treatment of his hemorrhoids to recent symptoms.  Colonoscopy 08/31/2013 - 79mm adenoma, diverticulosis, internal hemorrhoids - repeat in 5 years recommended  ERCP 02/13/16 - removal of CBD stones  Past Medical History:  Diagnosis Date  . Adenomatous colon polyp    tubular  . CAD (coronary artery disease)    a. Cath 06/08/01 at [facility] with normal LM and LAD, 95% LCx s/p Circumflex stent 4.0 x 15 mm Penta and 85% and 80% prox RCA s/p 3.5 x 23 mm Penta b. cath 01/20/2015 95% prox LCx ISR treated with DES, 55% prox to mid RCA, 45% distal RCA, 40% midLAD, EF 55%  . Diabetes mellitus without complication ([place])   . Diverticulosis   . Erectile dysfunction   . Gallstones    . Hyperlipidemia   . Hypertension      Past Surgical History:  Procedure Laterality Date  . ANGIOPLASTY  2002   2 stents  . CARDIAC CATHETERIZATION N/A 01/20/2015   Procedure: Left Heart Cath and Coronary Angiography;  Surgeon: Belva Crome, MD;  Location: [place] [facility];  Service: Cardiovascular;  Laterality: N/A;  . CARDIAC CATHETERIZATION N/A 01/27/2016   Procedure: Left Heart Cath and Coronary Angiography;  Surgeon: Sherren Mocha, MD;  Location: [place] [facility];  Service: Cardiovascular;  Laterality: N/A;  . CHOLECYSTECTOMY N/A 02/09/2016   Procedure: LAPAROSCOPIC CHOLECYSTECTOMY WITH INTRAOPERATIVE CHOLANGIOGRAM;  Surgeon: Erroll Luna, MD;  Location: [place];  Service: General;  Laterality: N/A;  . COLONOSCOPY W/ POLYPECTOMY  08/2013   Avg risk screening, Dr Thomasenia Sales. 5 mm tubular adenoma ascending.  Mild to moderated descending and sigmoid tics.  Internal hemorrhoids  . CORONARY ANGIOPLASTY WITH STENT PLACEMENT    . ERCP N/A 02/13/2016   Procedure: ENDOSCOPIC RETROGRADE CHOLANGIOPANCREATOGRAPHY (ERCP);  Surgeon: Doran Stabler, MD;  Location: [place];  Service: Gastroenterology;  Laterality: N/A;  . ROTATOR CUFF REPAIR Right 2009  . ROTATOR CUFF REPAIR Left 2014  . TRIGGER FINGER RELEASE Bilateral 2013   4 fingers, middle finger on both hands   Family History  Problem Relation Age of Onset  . CAD Brother 74       Died age  77  . Diabetes Brother   . Kidney disease Brother   . CAD Sister 81  . Heart disease Father 53       Valve replacement and CAD, CABG  . Diabetes Father   . Dementia Mother   . Crohn's disease Mother   . Colon cancer Neg Hx    Social History   Tobacco Use  . Smoking status: Former Smoker    Packs/day: 1.00    Years: 44.00    Pack years: 44.00    Types: Cigarettes    Quit date: 01/17/2015    Years since quitting: 4.2  . Smokeless tobacco: Never Used  Substance Use Topics  . Alcohol use: Yes    Comment: daily  . Drug use: No    Current Outpatient Medications  Medication Sig Dispense Refill  . acetaminophen (TYLENOL) 500 MG tablet Take 1,000 mg by mouth every 6 (six) hours as needed for headache (pain).    Marland Kitchen amLODipine (NORVASC) 5 MG tablet TAKE 1 TABLET BY MOUTH  DAILY 90 tablet 3  . aspirin EC 81 MG tablet Take 81 mg by mouth daily.    Marland Kitchen atorvastatin (LIPITOR) 80 MG tablet TAKE 1 TABLET BY MOUTH AT  BEDTIME 90 tablet 3  . CINNAMON PO Take 1,000 mg by mouth daily.    . fenofibrate (TRICOR) 48 MG tablet TAKE 1 TABLET BY MOUTH  DAILY 90 tablet 3  . glucose blood (ONETOUCH VERIO) test strip Use once a week to check blood sugar 100 each 0  . Krill Oil 500 MG CAPS Take 500 mg by mouth 2 (two) times daily.    . metoprolol tartrate (LOPRESSOR) 25 MG tablet Take 1 tablet (25 mg total) by mouth daily. 90 tablet 3  . Multiple Vitamin (MULTIVITAMIN WITH MINERALS) TABS tablet Take 1 tablet by mouth daily.    . ramipril (ALTACE) 10 MG capsule Take 1 capsule (10 mg total) by mouth 2 (two) times daily. 180 capsule 3  . RELION LANCETS MICRO-THIN 33G MISC Use once a week to check blood sugar 100 each 0  . tadalafil (CIALIS) 20 MG tablet Take 1 tablet (20 mg total) by mouth daily as needed for erectile dysfunction. 30 tablet 2  . traZODone (DESYREL) 50 MG tablet TAKE 1 TABLET BY MOUTH AT  BEDTIME AS NEEDED FOR SLEEP 90 tablet 3  . nitroGLYCERIN (NITROSTAT) 0.4 MG SL tablet Place 1 tablet (0.4 mg total) under the tongue every 5 (five) minutes as needed for chest pain. (Patient not taking: Reported on 04/18/2019) 25 tablet 0   No current facility-administered medications for this visit.    No Known Allergies   Review of Systems: All systems reviewed and negative except where noted in HPI.    Physical Exam: BP 126/78 (BP Location: Left Arm, Patient Position: Sitting, Cuff Size: Normal)   Pulse 76   Temp 98.3 F (36.8 C)   Ht 5' 4.5" (1.638 m) Comment: height measured without shoes  Wt 170 lb 4 oz (77.2 kg)   BMI 28.77 kg/m   Constitutional: Pleasant,well-developed, male in no acute distress. HEENT: Normocephalic and atraumatic. Conjunctivae are normal. No scleral icterus. Neck supple.  Cardiovascular: Normal rate, regular rhythm.  Pulmonary/chest: Effort normal and breath sounds normal. No wheezing, rales or rhonchi. Abdominal: Soft, nondistended, nontender.  There are no masses palpable. No hepatomegaly. DRE / Anoscopy - hemorrhoids noted internally in all positions, no mass lesions, no fissure, small external hemorrhoids. Extremities: no edema Lymphadenopathy: No cervical adenopathy noted. Neurological:  Alert and oriented to person place and time. Skin: Skin is warm and dry. No rashes noted. Psychiatric: Normal mood and affect. Behavior is normal.   ASSESSMENT AND PLAN: 68 year old male here for new patient assessment of the following:  Grade II hemorrhoids - patient with longstanding grade 2 hemorrhoids with intermittent bleeding.  He is requesting interventional therapy for his hemorrhoids.  I discussed options with him to include medical therapy, hemorrhoid banding, and surgery.  After discussion of options he strongly wanted to proceed with banding.  The left lateral hemorrhoid was banded today which he tolerated well, procedure note as outlined below.  Recommend he use Citrucel on a daily basis to keep stools soft and prevent straining.  He will follow up in 2 to 4 weeks for repeat banding, plan for total of 3 bands over the next few weeks.  He agreed  History of colon polyps - based on new surveillance guidelines, he is not due for another surveillance colonoscopy until 7 years from his last exam given one small adenoma removed, next due March 2022  [place] Cellar, MD [name]  CC: Shelda Pal*   PROCEDURE NOTE: The patient presents with symptomatic grade II  hemorrhoids, requesting rubber band ligation of his/her hemorrhoidal disease.  All risks, benefits and  alternative forms of therapy were described and informed consent was obtained.  In the Left Lateral Decubitus position anoscopic examination revealed grade II hemorrhoids in all positions.  The anorectum was pre-medicated with 0.125% nitroglycerin The decision was made to band the LL internal hemorrhoid, and the [place] was used to perform band ligation without complication.  Digital anorectal examination was then performed to assure proper positioning of the band, and to adjust the banded tissue as required.  The patient was discharged home without pain or other issues.  Dietary and behavioral recommendations were given and along with follow-up instructions.     The following adjunctive treatments were recommended: Citrucel daily  The patient will return in 2-4 weeks for  follow-up and possible additional banding as required. No complications were encountered and the patient tolerated the procedure well.

## 2019-04-23 DIAGNOSIS — L578 Other skin changes due to chronic exposure to nonionizing radiation: Secondary | ICD-10-CM | POA: Diagnosis not present

## 2019-04-23 DIAGNOSIS — L821 Other seborrheic keratosis: Secondary | ICD-10-CM | POA: Diagnosis not present

## 2019-04-23 DIAGNOSIS — Z85828 Personal history of other malignant neoplasm of skin: Secondary | ICD-10-CM | POA: Diagnosis not present

## 2019-04-23 DIAGNOSIS — L814 Other melanin hyperpigmentation: Secondary | ICD-10-CM | POA: Diagnosis not present

## 2019-04-23 DIAGNOSIS — D1801 Hemangioma of skin and subcutaneous tissue: Secondary | ICD-10-CM | POA: Diagnosis not present

## 2019-05-04 ENCOUNTER — Encounter: Payer: Self-pay | Admitting: Gastroenterology

## 2019-05-04 ENCOUNTER — Ambulatory Visit (INDEPENDENT_AMBULATORY_CARE_PROVIDER_SITE_OTHER): Payer: Medicare Other | Admitting: Gastroenterology

## 2019-05-04 VITALS — BP 128/68 | HR 72 | Temp 98.5°F | Ht 64.5 in | Wt 171.1 lb

## 2019-05-04 DIAGNOSIS — K641 Second degree hemorrhoids: Secondary | ICD-10-CM | POA: Diagnosis not present

## 2019-05-04 NOTE — Progress Notes (Signed)
68 y/o male here for a follow up visit for banding of grade II hemorrhoids. He had a banding of the LL hemorrhoid on 04/18/19.  Main symptoms historically were bleeding. He tolerated the last banding well'. Has noted a reduction in bleeding and symptoms and wishes to proceed with additional banding today.   Last colonoscopy 2015 with one small adenoma, repeat exam due in March 2022.  PROCEDURE NOTE: The patient presents with symptomatic grade II  hemorrhoids, requesting rubber band ligation of his/her hemorrhoidal disease.  All risks, benefits and alternative forms of therapy were described and informed consent was obtained.  The anorectum was pre-medicated with 0.125% nitroglycerin The decision was made to band the RP internal hemorrhoid, and the [place] was used to perform band ligation without complication.  Digital anorectal examination was then performed to assure proper positioning of the band, and to adjust the banded tissue as required.  The patient was discharged home without pain or other issues.  Dietary and behavioral recommendations were given and along with follow-up instructions.     The patient will return in 2-4 weeks for  follow-up and possible additional banding as required. No complications were encountered and the patient tolerated the procedure well.  [place] Cellar, MD [facility]

## 2019-05-04 NOTE — Patient Instructions (Signed)
If you are age 67 or older, your body mass index should be between 23-30. Your Body mass index is 28.92 kg/m. If this is out of the aforementioned range listed, please consider follow up with your Primary Care Provider.  If you are age 58 or younger, your body mass index should be between 19-25. Your Body mass index is 28.92 kg/m. If this is out of the aformentioned range listed, please consider follow up with your Primary Care Provider.   HEMORRHOID BANDING PROCEDURE    FOLLOW-UP CARE   1. The procedure you have had should have been relatively painless since the banding of the area involved does not have nerve endings and there is no pain sensation.  The rubber band cuts off the blood supply to the hemorrhoid and the band may fall off as soon as 48 hours after the banding (the band may occasionally be seen in the toilet bowl following a bowel movement). You may notice a temporary feeling of fullness in the rectum which should respond adequately to plain Tylenol or Motrin.  2. Following the banding, avoid strenuous exercise that evening and resume full activity the next day.  A sitz bath (soaking in a warm tub) or bidet is soothing, and can be useful for cleansing the area after bowel movements.     3. To avoid constipation, take two tablespoons of natural wheat bran, natural oat bran, flax, Benefiber or any over the counter fiber supplement and increase your water intake to 7-8 glasses daily.    4. Unless you have been prescribed anorectal medication, do not put anything inside your rectum for two weeks: No suppositories, enemas, fingers, etc.  5. Occasionally, you may have more bleeding than usual after the banding procedure.  This is often from the untreated hemorrhoids rather than the treated one.  Don't be concerned if there is a tablespoon or so of blood.  If there is more blood than this, lie flat with your bottom higher than your head and apply an ice pack to the area. If the bleeding  does not stop within a half an hour or if you feel faint, call our office at [phone] or go to the emergency room.  6. Problems are not common; however, if there is a substantial amount of bleeding, severe pain, chills, fever or difficulty passing urine (very rare) or other problems, you should call us at [phone] or report to the nearest emergency room.  7. Do not stay seated continuously for more than 2-3 hours for a day or two after the procedure.  Tighten your buttock muscles 10-15 times every two hours and take 10-15 deep breaths every 1-2 hours.  Do not spend more than a few minutes on the toilet if you cannot empty your bowel; instead re-visit the toilet at a later time.    You have your next banding appointment on Tuesday, 05-22-19 at 3:40pm.  Thank you for entrusting me with your care and for choosing [facility], Dr. Allen Cellar

## 2019-05-08 NOTE — Progress Notes (Signed)
HPI: FU CAD. Previously followed in [facility]. Patient had a non-ST elevation myocardial infarction in 2002. Cardiac catheterization revealed an 80% proximal right coronary artery followed by an 85%. There was a 90% circumflex. LV function normal. Patient had PCI of his circumflex and right coronary artery. Nuclear study in July of 2012 showed an ejection fraction of 76%. No ischemia.  Admitted 7/16 with a NSTEMI. LHC demonstrated pCFX 95% ISR which was treated with a DES. Residual CAD included mLAD 40%, pRCA stent ok with 25% ISR, mRCA 55%, dRCA 45%, RPDA 30%. EF was preserved.Repeat catheterization August 2017 secondary to chest pain revealed moderate coronary disease without significant obstruction. Medical therapy recommended. LV function normal.Head CT showed possible normal pressure hydrocephalus but neurology did not think further therapy indicated. Patient had ERCP with sphincterotomy for choledocholithiasis in August 2017. Abd ultrasound 11/18 showed ectatic aorta (2.6 cm). FU recommended 5 years.  Since last seen,the patient denies any dyspnea on exertion, orthopnea, PND, pedal edema, palpitations, syncope or chest pain.   Current Outpatient Medications  Medication Sig Dispense Refill   acetaminophen (TYLENOL) 500 MG tablet Take 1,000 mg by mouth every 6 (six) hours as needed for headache (pain).     amLODipine (NORVASC) 5 MG tablet TAKE 1 TABLET BY MOUTH  DAILY 90 tablet 3   aspirin EC 81 MG tablet Take 81 mg by mouth daily.     atorvastatin (LIPITOR) 80 MG tablet TAKE 1 TABLET BY MOUTH AT  BEDTIME 90 tablet 3   CINNAMON PO Take 1,000 mg by mouth daily.     fenofibrate (TRICOR) 48 MG tablet TAKE 1 TABLET BY MOUTH  DAILY 90 tablet 3   glucose blood (ONETOUCH VERIO) test strip Use once a week to check blood sugar 100 each 0   Krill Oil 500 MG CAPS Take 500 mg by mouth 2 (two) times daily.     methylcellulose (CITRUCEL) oral powder Take 1 packet by mouth daily.      metoprolol tartrate (LOPRESSOR) 25 MG tablet Take 1 tablet (25 mg total) by mouth daily. 90 tablet 3   Multiple Vitamin (MULTIVITAMIN WITH MINERALS) TABS tablet Take 1 tablet by mouth daily.     nitroGLYCERIN (NITROSTAT) 0.4 MG SL tablet Place 1 tablet (0.4 mg total) under the tongue every 5 (five) minutes as needed for chest pain. 25 tablet 0   ramipril (ALTACE) 10 MG capsule Take 1 capsule (10 mg total) by mouth 2 (two) times daily. 180 capsule 3   RELION LANCETS MICRO-THIN 33G MISC Use once a week to check blood sugar 100 each 0   tadalafil (CIALIS) 20 MG tablet Take 1 tablet (20 mg total) by mouth daily as needed for erectile dysfunction. 30 tablet 2   traZODone (DESYREL) 50 MG tablet TAKE 1 TABLET BY MOUTH AT  BEDTIME AS NEEDED FOR SLEEP 90 tablet 3   No current facility-administered medications for this visit.      Past Medical History:  Diagnosis Date   Adenomatous colon polyp    tubular   CAD (coronary artery disease)    a. Cath 06/08/01 at [facility] with normal LM and LAD, 95% LCx s/p Circumflex stent 4.0 x 15 mm Penta and 85% and 80% prox RCA s/p 3.5 x 23 mm Penta b. cath 01/20/2015 95% prox LCx ISR treated with DES, 55% prox to mid RCA, 45% distal RCA, 40% midLAD, EF 55%   Diabetes mellitus without complication ([place])    Diverticulosis  Erectile dysfunction    Gallstones    Hyperlipidemia    Hypertension     Past Surgical History:  Procedure Laterality Date   ANGIOPLASTY  2002   2 stents   CARDIAC CATHETERIZATION N/A 01/20/2015   Procedure: Left Heart Cath and Coronary Angiography;  Surgeon: Belva Crome, MD;  Location: [place] [facility];  Service: Cardiovascular;  Laterality: N/A;   CARDIAC CATHETERIZATION N/A 01/27/2016   Procedure: Left Heart Cath and Coronary Angiography;  Surgeon: Sherren Mocha, MD;  Location: [place] [facility];  Service: Cardiovascular;  Laterality: N/A;   CHOLECYSTECTOMY N/A 02/09/2016   Procedure: LAPAROSCOPIC  CHOLECYSTECTOMY WITH INTRAOPERATIVE CHOLANGIOGRAM;  Surgeon: Erroll Luna, MD;  Location: [place];  Service: General;  Laterality: N/A;   COLONOSCOPY W/ POLYPECTOMY  08/2013   Avg risk screening, Dr Thomasenia Sales. 5 mm tubular adenoma ascending.  Mild to moderated descending and sigmoid tics.  Internal hemorrhoids   CORONARY ANGIOPLASTY WITH STENT PLACEMENT     ERCP N/A 02/13/2016   Procedure: ENDOSCOPIC RETROGRADE CHOLANGIOPANCREATOGRAPHY (ERCP);  Surgeon: Doran Stabler, MD;  Location: [place];  Service: Gastroenterology;  Laterality: N/A;   ROTATOR CUFF REPAIR Right 2009   ROTATOR CUFF REPAIR Left 2014   TRIGGER FINGER RELEASE Bilateral 2013   4 fingers, middle finger on both hands    Social History   Socioeconomic History   Marital status: Significant Other    Spouse name: Not on file   Number of children: 2   Years of education: 26   Highest education level: Not on file  Occupational History   Occupation: Scientist, 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 strain: Not on file   Food insecurity    Worry: Not on file    Inability: Not on file   Transportation needs    Medical: Not on file    Non-medical: Not on file  Tobacco Use   Smoking status: Former Smoker    Packs/day: 1.00    Years: 44.00    Pack years: 44.00    Types: Cigarettes    Quit date: 01/17/2015    Years since quitting: 4.3   Smokeless tobacco: Never Used  Substance and Sexual Activity   Alcohol use: Yes    Comment: daily   Drug use: No   Sexual activity: Yes  Lifestyle   Physical activity    Days per week: Not on file    Minutes per session: Not on file   Stress: Not on file  Relationships   Social connections    Talks on phone: Not on file    Gets together: Not on file    Attends religious service: Not on file    Active member of club or organization: Not on file    Attends meetings of clubs or organizations: Not on file    Relationship status:  Not on file   Intimate partner violence    Fear of current or ex partner: Not on file    Emotionally abused: Not on file    Physically abused: Not on file    Forced sexual activity: Not on file  Other Topics Concern   Not on file  Social History Narrative   Fun: Boat, golf   Denies religious beliefs effecting health care.     Family History  Problem Relation Age of Onset   CAD Brother 66       Died age 60   Diabetes Brother  Kidney disease Brother    CAD Sister 71   Heart disease Father 33       Valve replacement and CAD, CABG   Diabetes Father    Dementia Mother    Crohn's disease Mother    Colon cancer Neg Hx     ROS: no fevers or chills, productive cough, hemoptysis, dysphasia, odynophagia, melena, hematochezia, dysuria, hematuria, rash, seizure activity, orthopnea, PND, pedal edema, claudication. Remaining systems are negative.  Physical Exam: Well-developed well-nourished in no acute distress.  Skin is warm and dry.  HEENT is normal.  Neck is supple.  Chest is clear to auscultation with normal expansion.  Cardiovascular exam is regular rate and rhythm.  Abdominal exam nontender or distended. No masses palpated. Extremities show no edema. neuro grossly intact  ECG-sinus rhythm at a rate of 69, left anterior fascicular block.  Personally reviewed  A/P  1 coronary artery disease-patient denies chest pain.  Continue medical therapy with aspirin and statin.  2 hypertension-blood pressure elevated; however patient states typically controlled.  Continue present medications and follow.  Check potassium and renal function.  3 hyperlipidemia-continue statin.  Recent LDL 87.  Add Zetia 10 mg daily.  Check lipids and liver in 12 weeks.  4 history of ectatic abdominal aorta-plan follow-up ultrasound November 2023.  Kirk Ruths, MD

## 2019-05-16 ENCOUNTER — Other Ambulatory Visit: Payer: Self-pay

## 2019-05-16 ENCOUNTER — Ambulatory Visit (INDEPENDENT_AMBULATORY_CARE_PROVIDER_SITE_OTHER): Payer: Medicare Other | Admitting: Cardiology

## 2019-05-16 ENCOUNTER — Encounter: Payer: Self-pay | Admitting: Cardiology

## 2019-05-16 VITALS — BP 155/84 | HR 69 | Ht 64.5 in | Wt 171.8 lb

## 2019-05-16 DIAGNOSIS — I10 Essential (primary) hypertension: Secondary | ICD-10-CM

## 2019-05-16 DIAGNOSIS — I2510 Atherosclerotic heart disease of native coronary artery without angina pectoris: Secondary | ICD-10-CM

## 2019-05-16 DIAGNOSIS — E7800 Pure hypercholesterolemia, unspecified: Secondary | ICD-10-CM

## 2019-05-16 MED ORDER — EZETIMIBE 10 MG PO TABS: 10.0000 mg | ORAL_TABLET | Freq: Every day | ORAL | 3 refills | Status: DC

## 2019-05-16 NOTE — Patient Instructions (Signed)
Medication Instructions:  START EZETIMIBE 10 MG ONCE DAILY  *If you need a refill on your cardiac medications before your next appointment, please call your pharmacy*  Lab Work: Your physician recommends that you return for lab work in: [place]  If you have labs (blood work) drawn today and your tests are completely normal, you will receive your results only by: Marland Kitchen MyChart Message (if you have MyChart) OR . A paper copy in the mail If you have any lab test that is abnormal or we need to change your treatment, we will call you to review the results.  Follow-Up: At [facility], you and your health needs are our priority.  As part of our continuing mission to provide you with exceptional heart care, we have created designated Provider Care Teams.  These Care Teams include your primary Cardiologist (physician) and Advanced Practice Providers (APPs -  Physician Assistants and Nurse Practitioners) who all work together to provide you with the care you need, when you need it.  Your next appointment:   12 month(s)  The format for your next appointment:   In Person  Provider:   Kirk Ruths, MD

## 2019-05-17 ENCOUNTER — Other Ambulatory Visit: Payer: Self-pay

## 2019-05-18 ENCOUNTER — Ambulatory Visit (INDEPENDENT_AMBULATORY_CARE_PROVIDER_SITE_OTHER): Payer: Medicare Other | Admitting: Family Medicine

## 2019-05-18 ENCOUNTER — Encounter: Payer: Self-pay | Admitting: Family Medicine

## 2019-05-18 VITALS — BP 120/82 | HR 77 | Temp 96.6°F | Ht 64.0 in | Wt 170.4 lb

## 2019-05-18 DIAGNOSIS — Z125 Encounter for screening for malignant neoplasm of prostate: Secondary | ICD-10-CM

## 2019-05-18 DIAGNOSIS — Z0000 Encounter for general adult medical examination without abnormal findings: Secondary | ICD-10-CM

## 2019-05-18 DIAGNOSIS — K219 Gastro-esophageal reflux disease without esophagitis: Secondary | ICD-10-CM

## 2019-05-18 DIAGNOSIS — E119 Type 2 diabetes mellitus without complications: Secondary | ICD-10-CM

## 2019-05-18 LAB — LIPID PANEL
Cholesterol: 146 mg/dL (ref 0–200)
HDL: 40.5 mg/dL (ref >39.00)
NonHDL: 105.29
Total CHOL/HDL Ratio: 4
Triglycerides: 205 mg/dL — ABNORMAL HIGH (ref 0.0–149.0)
VLDL: 41 mg/dL — ABNORMAL HIGH (ref 0.0–40.0)

## 2019-05-18 LAB — COMPREHENSIVE METABOLIC PANEL
ALT: 26 U/L (ref 0–53)
AST: 21 U/L (ref 0–37)
Albumin: 4.5 g/dL (ref 3.5–5.2)
Alkaline Phosphatase: 39 U/L (ref 39–117)
BUN: 12 mg/dL (ref 6–23)
CO2: 29 mEq/L (ref 19–32)
Calcium: 9.3 mg/dL (ref 8.4–10.5)
Chloride: 102 mEq/L (ref 96–112)
Creatinine, Ser: 0.96 mg/dL (ref 0.40–1.50)
GFR: 78.09 mL/min (ref >60.00)
Glucose, Bld: 151 mg/dL — ABNORMAL HIGH (ref 70–99)
Potassium: 4 mEq/L (ref 3.5–5.1)
Sodium: 139 mEq/L (ref 135–145)
Total Bilirubin: 0.7 mg/dL (ref 0.2–1.2)
Total Protein: 6.6 g/dL (ref 6.0–8.3)

## 2019-05-18 LAB — MICROALBUMIN / CREATININE URINE RATIO
Creatinine,U: 175.8 mg/dL
Microalb Creat Ratio: 0.7 mg/g (ref 0.0–30.0)
Microalb, Ur: 1.2 mg/dL (ref 0.0–1.9)

## 2019-05-18 LAB — CBC
HCT: 45.5 % (ref 39.0–52.0)
Hemoglobin: 15.2 g/dL (ref 13.0–17.0)
MCHC: 33.4 g/dL (ref 30.0–36.0)
MCV: 92.7 fl (ref 78.0–100.0)
Platelets: 249 10*3/uL (ref 150.0–400.0)
RBC: 4.91 Mil/uL (ref 4.22–5.81)
RDW: 13.3 % (ref 11.5–15.5)
WBC: 9 10*3/uL (ref 4.0–10.5)

## 2019-05-18 LAB — LDL CHOLESTEROL, DIRECT: Direct LDL: 74 mg/dL

## 2019-05-18 LAB — PSA: PSA: 1.77 ng/mL (ref 0.10–4.00)

## 2019-05-18 LAB — HEMOGLOBIN A1C: Hgb A1c MFr Bld: 7 % — ABNORMAL HIGH (ref 4.6–6.5)

## 2019-05-18 MED ORDER — PANTOPRAZOLE SODIUM 40 MG PO TBEC: 40.0000 mg | DELAYED_RELEASE_TABLET | Freq: Every day | ORAL | 2 refills | Status: DC

## 2019-05-18 NOTE — Patient Instructions (Signed)
Give us 2-3 business days to get the results of your labs back.   Keep the diet clean and stay active.  Let us know if you need anything. 

## 2019-05-18 NOTE — Progress Notes (Signed)
Chief Complaint  Patient presents with  . Annual Exam    Well Male Troy Boyer is here for a complete physical.   His last physical was >1 year ago.  Current diet: in general, diet is fair voeral.  Current exercise: walking Weight trend: stable Daytime fatigue? No. Seat belt? Yes.    Health maintenance Shingrix- Yes Colonoscopy- Yes Tetanus- Yes HIV- Yes Hep C- Yes Prostate cancer screening- Yes   Past Medical History:  Diagnosis Date  . Adenomatous colon polyp    tubular  . CAD (coronary artery disease)    a. Cath 06/08/01 at [facility] with normal LM and LAD, 95% LCx s/p Circumflex stent 4.0 x 15 mm Penta and 85% and 80% prox RCA s/p 3.5 x 23 mm Penta b. cath 01/20/2015 95% prox LCx ISR treated with DES, 55% prox to mid RCA, 45% distal RCA, 40% midLAD, EF 55%  . Diabetes mellitus without complication ([place])   . Diverticulosis   . Erectile dysfunction   . Gallstones   . Hyperlipidemia   . Hypertension       Past Surgical History:  Procedure Laterality Date  . ANGIOPLASTY  2002   2 stents  . CARDIAC CATHETERIZATION N/A 01/20/2015   Procedure: Left Heart Cath and Coronary Angiography;  Surgeon: Belva Crome, MD;  Location: [place] [facility];  Service: Cardiovascular;  Laterality: N/A;  . CARDIAC CATHETERIZATION N/A 01/27/2016   Procedure: Left Heart Cath and Coronary Angiography;  Surgeon: Sherren Mocha, MD;  Location: [place] [facility];  Service: Cardiovascular;  Laterality: N/A;  . CHOLECYSTECTOMY N/A 02/09/2016   Procedure: LAPAROSCOPIC CHOLECYSTECTOMY WITH INTRAOPERATIVE CHOLANGIOGRAM;  Surgeon: Erroll Luna, MD;  Location: [place];  Service: General;  Laterality: N/A;  . COLONOSCOPY W/ POLYPECTOMY  08/2013   Avg risk screening, Dr Thomasenia Sales. 5 mm tubular adenoma ascending.  Mild to moderated descending and sigmoid tics.  Internal hemorrhoids  . CORONARY ANGIOPLASTY WITH STENT PLACEMENT    . ERCP N/A 02/13/2016   Procedure: ENDOSCOPIC RETROGRADE  CHOLANGIOPANCREATOGRAPHY (ERCP);  Surgeon: Doran Stabler, MD;  Location: [place];  Service: Gastroenterology;  Laterality: N/A;  . ROTATOR CUFF REPAIR Right 2009  . ROTATOR CUFF REPAIR Left 2014  . TRIGGER FINGER RELEASE Bilateral 2013   4 fingers, middle finger on both hands    Medications  Current Outpatient Medications on File Prior to Visit  Medication Sig Dispense Refill  . acetaminophen (TYLENOL) 500 MG tablet Take 1,000 mg by mouth every 6 (six) hours as needed for headache (pain).    Marland Kitchen amLODipine (NORVASC) 5 MG tablet TAKE 1 TABLET BY MOUTH  DAILY 90 tablet 3  . aspirin EC 81 MG tablet Take 81 mg by mouth daily.    Marland Kitchen atorvastatin (LIPITOR) 80 MG tablet TAKE 1 TABLET BY MOUTH AT  BEDTIME 90 tablet 3  . CINNAMON PO Take 1,000 mg by mouth daily.    Marland Kitchen ezetimibe (ZETIA) 10 MG tablet Take 1 tablet (10 mg total) by mouth daily. 90 tablet 3  . fenofibrate (TRICOR) 48 MG tablet TAKE 1 TABLET BY MOUTH  DAILY 90 tablet 3  . glucose blood (ONETOUCH VERIO) test strip Use once a week to check blood sugar 100 each 0  . Krill Oil 500 MG CAPS Take 500 mg by mouth 2 (two) times daily.    . methylcellulose (CITRUCEL) oral powder Take 1 packet by mouth daily.    . metoprolol tartrate (LOPRESSOR) 25 MG tablet Take 1 tablet (25  mg total) by mouth daily. 90 tablet 3  . Multiple Vitamin (MULTIVITAMIN WITH MINERALS) TABS tablet Take 1 tablet by mouth daily.    . nitroGLYCERIN (NITROSTAT) 0.4 MG SL tablet Place 1 tablet (0.4 mg total) under the tongue every 5 (five) minutes as needed for chest pain. 25 tablet 0  . ramipril (ALTACE) 10 MG capsule Take 1 capsule (10 mg total) by mouth 2 (two) times daily. 180 capsule 3  . RELION LANCETS MICRO-THIN 33G MISC Use once a week to check blood sugar 100 each 0  . tadalafil (CIALIS) 20 MG tablet Take 1 tablet (20 mg total) by mouth daily as needed for erectile dysfunction. 30 tablet 2  . traZODone (DESYREL) 50 MG tablet TAKE 1 TABLET BY MOUTH AT  BEDTIME AS  NEEDED FOR SLEEP 90 tablet 3    Allergies No Known Allergies  Family History Family History  Problem Relation Age of Onset  . CAD Brother 62       Died age 76  . Diabetes Brother   . Kidney disease Brother   . CAD Sister 24  . Heart disease Father 6       Valve replacement and CAD, CABG  . Diabetes Father   . Dementia Mother   . Crohn's disease Mother   . Colon cancer Neg Hx     Review of Systems: Constitutional:  no fevers Eye:  no recent significant change in vision Ear/Nose/Mouth/Throat:  Ears:  no hearing loss Nose/Mouth/Throat:  no complaints of nasal congestion, no sore throat Cardiovascular:  no chest pain, no palpitations Respiratory:  no cough and no shortness of breath Gastrointestinal: +reflux, no change in bowel habits GU:  Male: negative for dysuria, frequency, and incontinence and negative for prostate symptoms Musculoskeletal/Extremities:  no pain, redness, or swelling of the joints Integumentary (Skin/Breast):  no abnormal skin lesions reported Neurologic:  no headaches Endocrine: No unexpected weight changes Hematologic/Lymphatic:  no abnormal bleeding  Exam BP 120/82 (BP Location: Left Arm, Patient Position: Sitting, Cuff Size: Normal)   Pulse 77   Temp (!) 96.6 F (35.9 C) (Temporal)   Ht 5' 4" (1.626 m)   Wt 170 lb 6 oz (77.3 kg)   SpO2 96%   BMI 29.24 kg/m  General:  well developed, well nourished, in no apparent distress Skin:  no significant moles, warts, or growths Head:  no masses, lesions, or tenderness Eyes:  pupils equal and round, sclera anicteric without injection Ears:  canals without lesions, TMs shiny without retraction, no obvious effusion, no erythema Nose:  nares patent, septum midline, mucosa normal Throat/Pharynx:  lips and gingiva without lesion; tongue and uvula midline; non-inflamed pharynx; no exudates or postnasal drainage Neck: neck supple without adenopathy, thyromegaly, or masses Cardiac: RRR, no bruits, no LE  edema Lungs:  clear to auscultation, breath sounds equal bilaterally, no respiratory distress Rectal: Deferred Musculoskeletal:  symmetrical muscle groups noted without atrophy or deformity Neuro: sensation intact to pinprick b/l feet; gait normal; deep tendon reflexes normal and symmetric Psych: well oriented with normal range of affect and appropriate judgment/insight  Assessment and Plan  Well adult exam - Plan: CBC, Comp Met (CMET), Lipid Profile  Screening for prostate cancer - Plan: PSA  Non-insulin dependent type 2 diabetes mellitus ([place]) - Plan: HgB A1c, Urine Microalbumin w/creat. ratio  Gastroesophageal reflux disease, unspecified whether esophagitis present   Well 68 y.o. male. Counseled on diet and exercise. Counseled on risks and benefits of prostate cancer screening with PSA. The patient agrees  to undergo testing. Immunizations, labs, and further orders as above. Follow up in 6 mo. The patient voiced understanding and agreement to the plan.  [place], DO 05/18/19 11:16 AM

## 2019-05-22 ENCOUNTER — Encounter: Payer: Self-pay | Admitting: Gastroenterology

## 2019-05-22 ENCOUNTER — Ambulatory Visit (INDEPENDENT_AMBULATORY_CARE_PROVIDER_SITE_OTHER): Payer: Medicare Other | Admitting: Gastroenterology

## 2019-05-22 VITALS — BP 130/78 | HR 72 | Temp 98.5°F | Ht 64.5 in | Wt 172.5 lb

## 2019-05-22 DIAGNOSIS — K641 Second degree hemorrhoids: Secondary | ICD-10-CM

## 2019-05-22 NOTE — Progress Notes (Signed)
  68 y/o male here for a follow up visit for banding of grade II hemorrhoids. He had a banding of the LL hemorrhoid on 04/18/19 and RP on 05/04/19.  Main symptoms historically were bleeding. He tolerated the last banding well. Has noted a reduction in bleeding and symptoms and wishes to proceed with final banding today.   Last colonoscopy 2015 with one small adenoma, repeat exam due in March 2022.  PROCEDURE NOTE: The patient presents with symptomatic grade II  hemorrhoids, requesting rubber band ligation of his/her hemorrhoidal disease. All risks, benefits and alternative forms of therapy were described and informed consent was obtained.  The anorectum was pre-medicated with 0.125% nitroglycerin The decision was made to band the RA internal hemorrhoid, and the [place] was used to perform band ligation without complication. Digital anorectal examination was then performed to assure proper positioning of the band, and to adjust the banded tissue as required.  The patient was discharged home without pain or other issues. Dietary and behavioral recommendations were given and along with follow-up instructions.    The patient will return as needed for follow-up and possible additional banding as required. No complications were encountered and the patient tolerated the procedure well.  [place] Cellar, MD [facility]

## 2019-05-22 NOTE — Patient Instructions (Signed)
If you are age 67 or older, your body mass index should be between 23-30. Your Body mass index is 29.15 kg/m. If this is out of the aforementioned range listed, please consider follow up with your Primary Care Provider.  If you are age 33 or younger, your body mass index should be between 19-25. Your Body mass index is 29.15 kg/m. If this is out of the aformentioned range listed, please consider follow up with your Primary Care Provider.   HEMORRHOID BANDING PROCEDURE    FOLLOW-UP CARE   1. The procedure you have had should have been relatively painless since the banding of the area involved does not have nerve endings and there is no pain sensation.  The rubber band cuts off the blood supply to the hemorrhoid and the band may fall off as soon as 48 hours after the banding (the band may occasionally be seen in the toilet bowl following a bowel movement). You may notice a temporary feeling of fullness in the rectum which should respond adequately to plain Tylenol or Motrin.  2. Following the banding, avoid strenuous exercise that evening and resume full activity the next day.  A sitz bath (soaking in a warm tub) or bidet is soothing, and can be useful for cleansing the area after bowel movements.     3. To avoid constipation, take two tablespoons of natural wheat bran, natural oat bran, flax, Benefiber or any over the counter fiber supplement and increase your water intake to 7-8 glasses daily.    4. Unless you have been prescribed anorectal medication, do not put anything inside your rectum for two weeks: No suppositories, enemas, fingers, etc.  5. Occasionally, you may have more bleeding than usual after the banding procedure.  This is often from the untreated hemorrhoids rather than the treated one.  Don't be concerned if there is a tablespoon or so of blood.  If there is more blood than this, lie flat with your bottom higher than your head and apply an ice pack to the area. If the bleeding  does not stop within a half an hour or if you feel faint, call our office at [phone] or go to the emergency room.  6. Problems are not common; however, if there is a substantial amount of bleeding, severe pain, chills, fever or difficulty passing urine (very rare) or other problems, you should call us at [phone] or report to the nearest emergency room.  7. Do not stay seated continuously for more than 2-3 hours for a day or two after the procedure.  Tighten your buttock muscles 10-15 times every two hours and take 10-15 deep breaths every 1-2 hours.  Do not spend more than a few minutes on the toilet if you cannot empty your bowel; instead re-visit the toilet at a later time.    Thank you for entrusting me with your care and for choosing [facility], Dr. [place] Cellar

## 2019-06-19 ENCOUNTER — Encounter: Payer: Self-pay | Admitting: Family Medicine

## 2019-07-19 ENCOUNTER — Encounter: Payer: Self-pay | Admitting: Family Medicine

## 2019-07-19 DIAGNOSIS — R3915 Urgency of urination: Secondary | ICD-10-CM | POA: Diagnosis not present

## 2019-07-20 ENCOUNTER — Other Ambulatory Visit: Payer: Self-pay | Admitting: Family Medicine

## 2019-07-20 DIAGNOSIS — N528 Other male erectile dysfunction: Secondary | ICD-10-CM

## 2019-07-23 ENCOUNTER — Telehealth: Payer: Self-pay

## 2019-07-23 NOTE — Telephone Encounter (Signed)
Patient called back and said his reflux has been better since he started (a few days ago) eating supper earlier. He still wanted to schedule an office visit with Dr Havery Moros, which I did.

## 2019-07-23 NOTE — Telephone Encounter (Signed)
Left message to please call back. °

## 2019-07-23 NOTE — Telephone Encounter (Signed)
See phone note

## 2019-07-23 NOTE — Telephone Encounter (Signed)
Patient returned your call, please call patient one more time.   

## 2019-07-24 ENCOUNTER — Other Ambulatory Visit: Payer: Self-pay

## 2019-07-24 ENCOUNTER — Telehealth: Payer: Self-pay | Admitting: Family Medicine

## 2019-07-24 ENCOUNTER — Other Ambulatory Visit: Payer: Self-pay | Admitting: Family Medicine

## 2019-07-24 ENCOUNTER — Other Ambulatory Visit (INDEPENDENT_AMBULATORY_CARE_PROVIDER_SITE_OTHER): Payer: Medicare Other

## 2019-07-24 DIAGNOSIS — N528 Other male erectile dysfunction: Secondary | ICD-10-CM

## 2019-07-24 DIAGNOSIS — E349 Endocrine disorder, unspecified: Secondary | ICD-10-CM

## 2019-07-24 LAB — TESTOSTERONE: Testosterone: 188.02 ng/dL — ABNORMAL LOW (ref 300.00–890.00)

## 2019-07-24 NOTE — Telephone Encounter (Signed)
Called to confirm date of first vaccination/which was 07/23/19 at [facility]. Documented under historical vaccinations.

## 2019-07-24 NOTE — Telephone Encounter (Signed)
Pt came in office to do labs and wanted to inform the provider that he got his first shot for the covid vaccination.

## 2019-07-31 ENCOUNTER — Encounter: Payer: Self-pay | Admitting: Family Medicine

## 2019-08-01 MED ORDER — PANTOPRAZOLE SODIUM 40 MG PO TBEC: 40.0000 mg | DELAYED_RELEASE_TABLET | Freq: Every day | ORAL | 2 refills | Status: DC

## 2019-08-07 ENCOUNTER — Other Ambulatory Visit: Payer: Self-pay

## 2019-08-07 ENCOUNTER — Other Ambulatory Visit (INDEPENDENT_AMBULATORY_CARE_PROVIDER_SITE_OTHER): Payer: Medicare Other

## 2019-08-07 DIAGNOSIS — E349 Endocrine disorder, unspecified: Secondary | ICD-10-CM | POA: Diagnosis not present

## 2019-08-10 LAB — TESTOSTERONE, FREE: TESTOSTERONE FREE: 48.6 pg/mL (ref 46.0–224.0)

## 2019-08-12 ENCOUNTER — Encounter: Payer: Self-pay | Admitting: Family Medicine

## 2019-08-16 DIAGNOSIS — E7800 Pure hypercholesterolemia, unspecified: Secondary | ICD-10-CM | POA: Diagnosis not present

## 2019-08-16 DIAGNOSIS — I2510 Atherosclerotic heart disease of native coronary artery without angina pectoris: Secondary | ICD-10-CM | POA: Diagnosis not present

## 2019-08-17 ENCOUNTER — Encounter: Payer: Self-pay | Admitting: Family Medicine

## 2019-08-17 LAB — COMPREHENSIVE METABOLIC PANEL
ALT: 29 IU/L (ref 0–44)
AST: 24 IU/L (ref 0–40)
Albumin/Globulin Ratio: 2.1 (ref 1.2–2.2)
Albumin: 4.7 g/dL (ref 3.8–4.8)
Alkaline Phosphatase: 48 IU/L (ref 39–117)
BUN/Creatinine Ratio: 12 (ref 10–24)
BUN: 12 mg/dL (ref 8–27)
Bilirubin Total: 0.5 mg/dL (ref 0.0–1.2)
CO2: 23 mmol/L (ref 20–29)
Calcium: 9.7 mg/dL (ref 8.6–10.2)
Chloride: 102 mmol/L (ref 96–106)
Creatinine, Ser: 0.97 mg/dL (ref 0.76–1.27)
GFR calc Af Amer: 93 mL/min/{1.73_m2} (ref >59)
GFR calc non Af Amer: 80 mL/min/{1.73_m2} (ref >59)
Globulin, Total: 2.2 g/dL (ref 1.5–4.5)
Glucose: 150 mg/dL — ABNORMAL HIGH (ref 65–99)
Potassium: 4.4 mmol/L (ref 3.5–5.2)
Sodium: 143 mmol/L (ref 134–144)
Total Protein: 6.9 g/dL (ref 6.0–8.5)

## 2019-08-17 LAB — LIPID PANEL
Chol/HDL Ratio: 3.1 ratio (ref 0.0–5.0)
Cholesterol, Total: 106 mg/dL (ref 100–199)
HDL: 34 mg/dL — ABNORMAL LOW (ref >39)
LDL Chol Calc (NIH): 45 mg/dL (ref 0–99)
Triglycerides: 158 mg/dL — ABNORMAL HIGH (ref 0–149)
VLDL Cholesterol Cal: 27 mg/dL (ref 5–40)

## 2019-08-19 ENCOUNTER — Other Ambulatory Visit: Payer: Self-pay | Admitting: Cardiology

## 2019-08-27 ENCOUNTER — Other Ambulatory Visit: Payer: Self-pay

## 2019-08-27 ENCOUNTER — Ambulatory Visit: Payer: Medicare Other | Admitting: Gastroenterology

## 2019-08-27 ENCOUNTER — Encounter: Payer: Self-pay | Admitting: Gastroenterology

## 2019-08-27 VITALS — BP 106/60 | HR 73 | Temp 98.2°F | Ht 64.0 in | Wt 171.0 lb

## 2019-08-27 DIAGNOSIS — K219 Gastro-esophageal reflux disease without esophagitis: Secondary | ICD-10-CM | POA: Diagnosis not present

## 2019-08-27 DIAGNOSIS — Z860100 Personal history of colon polyps, unspecified: Secondary | ICD-10-CM

## 2019-08-27 DIAGNOSIS — R194 Change in bowel habit: Secondary | ICD-10-CM

## 2019-08-27 MED ORDER — PANTOPRAZOLE SODIUM 20 MG PO TBEC: 20.0000 mg | DELAYED_RELEASE_TABLET | Freq: Every day | ORAL | Status: DC

## 2019-08-27 NOTE — Progress Notes (Signed)
HPI :  69 year old man here for a follow-up for reflux and bowel changes/ hemorrhoids.  Patient has a history of symptomatic grade 2 hemorrhoids.  He underwent a series of 3 banding's from October to November 2020 and tolerated it well.  He was taking Metamucil and fiber supplementation to reduce his symptoms but felt as though this was actually making his bowel symptoms altered.  He was having irritation from it and stopped it a week or 2 ago and the symptoms have resolved.  He is not taking any fiber supplementation at this time and is having regular bowels and no bleeding.  He is doing pretty well with this at present time.  He has some occasional postprandial urgency of his bowels and had an episode of fecal incontinence several weeks ago after eating out in a restaurant.  He asks about what he can take for urgency at times.  Otherwise he has had some issues with reflux in the past several weeks.  Feeling of water brash, pyrosis and occasional regurgitation.  Symptoms were mostly bother him at night.  He was snacking late into the night and has stopped eating after 10 PM, he thinks it has helped.  Initially was on a regimen of Protonix 40 mg once a day which escalated to twice a day with the addition of Pepcid.  Eventually this controlled his symptoms and he has since reduced/change his regimen to Protonix 40 mg once nightly.  He is no longer taking the Pepcid.  On this regimen his symptoms are pretty well controlled and he is happy with the regimen.  He denies any dysphagia, no odynophagia.  He has never had a prior endoscopy, denies any family history of esophageal cancer.  He historically has not had any chronic reflux symptoms as he is aware of, this is relatively new issue he has been dealing with.  We discussed options moving forward.  Prior work-up: Colonoscopy 08/31/2013 - 61mm adenoma, diverticulosis, internal hemorrhoids - repeat in 7 years recommended (08/2020)  ERCP 02/13/16 - removal of  CBD stones   Past Medical History:  Diagnosis Date  . Adenomatous colon polyp    tubular  . CAD (coronary artery disease)    a. Cath 06/08/01 at [facility] with normal LM and LAD, 95% LCx s/p Circumflex stent 4.0 x 15 mm Penta and 85% and 80% prox RCA s/p 3.5 x 23 mm Penta b. cath 01/20/2015 95% prox LCx ISR treated with DES, 55% prox to mid RCA, 45% distal RCA, 40% midLAD, EF 55%  . Diabetes mellitus without complication ([place])   . Diverticulosis   . Erectile dysfunction   . Gallstones   . Hyperlipidemia   . Hypertension      Past Surgical History:  Procedure Laterality Date  . ANGIOPLASTY  2002   2 stents  . CARDIAC CATHETERIZATION N/A 01/20/2015   Procedure: Left Heart Cath and Coronary Angiography;  Surgeon: Belva Crome, MD;  Location: [place] [facility];  Service: Cardiovascular;  Laterality: N/A;  . CARDIAC CATHETERIZATION N/A 01/27/2016   Procedure: Left Heart Cath and Coronary Angiography;  Surgeon: Sherren Mocha, MD;  Location: [place] [facility];  Service: Cardiovascular;  Laterality: N/A;  . CHOLECYSTECTOMY N/A 02/09/2016   Procedure: LAPAROSCOPIC CHOLECYSTECTOMY WITH INTRAOPERATIVE CHOLANGIOGRAM;  Surgeon: Erroll Luna, MD;  Location: [place];  Service: General;  Laterality: N/A;  . COLONOSCOPY W/ POLYPECTOMY  08/2013   Avg risk screening, Dr Thomasenia Sales. 5 mm tubular adenoma ascending.  Mild to  moderated descending and sigmoid tics.  Internal hemorrhoids  . CORONARY ANGIOPLASTY WITH STENT PLACEMENT    . ERCP N/A 02/13/2016   Procedure: ENDOSCOPIC RETROGRADE CHOLANGIOPANCREATOGRAPHY (ERCP);  Surgeon: Doran Stabler, MD;  Location: [place];  Service: Gastroenterology;  Laterality: N/A;  . ROTATOR CUFF REPAIR Right 2009  . ROTATOR CUFF REPAIR Left 2014  . TRIGGER FINGER RELEASE Bilateral 2013   4 fingers, middle finger on both hands   Family History  Problem Relation Age of Onset  . CAD Brother 82       Died age 89  . Diabetes Brother   . Kidney disease  Brother   . CAD Sister 7  . Heart disease Father 1       Valve replacement and CAD, CABG  . Diabetes Father   . Dementia Mother   . Crohn's disease Mother   . Colon cancer Neg Hx    Social History   Tobacco Use  . Smoking status: Former Smoker    Packs/day: 1.00    Years: 44.00    Pack years: 44.00    Types: Cigarettes    Quit date: 01/17/2015    Years since quitting: 4.6  . Smokeless tobacco: Never Used  Substance Use Topics  . Alcohol use: Yes    Comment: daily  . Drug use: No   Current Outpatient Medications  Medication Sig Dispense Refill  . acetaminophen (TYLENOL) 500 MG tablet Take 1,000 mg by mouth every 6 (six) hours as needed for headache (pain).    Marland Kitchen amLODipine (NORVASC) 5 MG tablet TAKE 1 TABLET BY MOUTH  DAILY 90 tablet 3  . aspirin EC 81 MG tablet Take 81 mg by mouth daily.    Marland Kitchen atorvastatin (LIPITOR) 80 MG tablet TAKE 1 TABLET BY MOUTH AT  BEDTIME 90 tablet 3  . CINNAMON PO Take 1,000 mg by mouth daily.    . fenofibrate (TRICOR) 48 MG tablet TAKE 1 TABLET BY MOUTH  DAILY 90 tablet 3  . glucose blood (ONETOUCH VERIO) test strip Use once a week to check blood sugar 100 each 0  . Krill Oil 500 MG CAPS Take 500 mg by mouth 2 (two) times daily.    . metoprolol tartrate (LOPRESSOR) 25 MG tablet TAKE 1 TABLET BY MOUTH  DAILY 90 tablet 2  . Multiple Vitamin (MULTIVITAMIN WITH MINERALS) TABS tablet Take 1 tablet by mouth daily.    . nitroGLYCERIN (NITROSTAT) 0.4 MG SL tablet Place 1 tablet (0.4 mg total) under the tongue every 5 (five) minutes as needed for chest pain. 25 tablet 0  . pantoprazole (PROTONIX) 40 MG tablet Take 1 tablet (40 mg total) by mouth daily. 90 tablet 2  . ramipril (ALTACE) 10 MG capsule TAKE 1 CAPSULE BY MOUTH TWO TIMES DAILY 180 capsule 2  . RELION LANCETS MICRO-THIN 33G MISC Use once a week to check blood sugar 100 each 0  . tadalafil (CIALIS) 20 MG tablet Take 1 tablet (20 mg total) by mouth daily as needed for erectile dysfunction. 30 tablet 2   . traZODone (DESYREL) 50 MG tablet TAKE 1 TABLET BY MOUTH AT  BEDTIME AS NEEDED FOR SLEEP 90 tablet 3  . ezetimibe (ZETIA) 10 MG tablet Take 1 tablet (10 mg total) by mouth daily. 90 tablet 3   No current facility-administered medications for this visit.   No Known Allergies   Review of Systems: All systems reviewed and negative except where noted in HPI.    No results found.  Physical Exam: BP 106/60   Pulse 73   Temp 98.2 F (36.8 C)   Ht 5\' 4"  (1.626 m)   Wt 171 lb (77.6 kg)   BMI 29.35 kg/m  Constitutional: Pleasant,well-developed, male in no acute distress. Neurological: Alert and oriented to person place and time. Psychiatric: Normal mood and affect. Behavior is normal.   ASSESSMENT AND PLAN: 69 year old male here for reassessment of the following:  GERD - previously had not had much baseline symptoms at all, then escalated to high-dose PPI with Pepcid as well to control symptoms.  Since of last seen him he has since decreased the regimen to 40 mg Protonix every night and that seems to be working well as monotherapy right now.  I counseled him on reflux management strategies.  I recommend he avoid eating anything within 3 hours of bedtime which should hopefully help with this.  Long-term we discussed the risks of chronic PPI therapy.  We want to use along term the lowest dose needed to control symptoms with the use of an alternative.  Since he is doing pretty well right now, he will reduce to 20 mg Protonix nightly.  If he has frequent breakthrough he can increase the dose as needed.  We discussed the role of endoscopy in this situation. His symptoms are fairly recent otherwise without chronicity, do not feel strongly he needs an endoscopy unless symptoms persist despite medical therapy.  He agreed with this and we will keep you posted moving forward.  Altered bowel habits / History of colon polyps - no further bleeding status post banding.  He stopped fiber supplementation  and actually his bowel habits are more regular than it used to be without symptoms, doing pretty well at this point.  He does have some occasional postprandial urgency, he will keep track of which foods trigger this but I suspect could be related to cholecystectomy.  If he is concerned about urgency he can take some Imodium when he eats out at restaurants if he does not have easy access to a toilet.  We could also consider using Colestid as needed.  He is on a fairly new aggressive antilipid regimen, will avoid Colestid right now and recommend Imodium as needed.  He can follow-up with me as needed.  He is due for surveillance colonoscopy in 1 year.  I spent 30 minutes of time, including in depth chart review, independent review of results as outlined above, communicating results with the patient directly, face-to-face time with the patient, coordinating care, and ordering studies and medications as appropriate, and documenting this encounter.  [place] Cellar, MD [facility]

## 2019-08-27 NOTE — Patient Instructions (Signed)
If you are age 68 or older, your body mass index should be between 23-30. Your Body mass index is 29.35 kg/m. If this is out of the aforementioned range listed, please consider follow up with your Primary Care Provider.  If you are age 20 or younger, your body mass index should be between 19-25. Your Body mass index is 29.35 kg/m. If this is out of the aformentioned range listed, please consider follow up with your Primary Care Provider.   Decrease Protonix to 20 mg once a day.  Please follow up in one year.  Thank you for entrusting me with your care and for choosing [facility], Dr. [place] Cellar

## 2019-09-08 IMAGING — US US AORTA
1 series · 14 of 22 positions shown · non-contrast
Comparison: None.

CLINICAL DATA: Hypertension. Coronary artery disease. Abdominal
bruit. Hyperlipidemia.

EXAM:
ULTRASOUND OF ABDOMINAL AORTA
TECHNIQUE: Ultrasound examination of the abdominal aorta was performed to
evaluate for abdominal aortic aneurysm.

[Series 1: us aorta · 0.26mm/px · 14 of 22 slices shown]
[im 1/22]
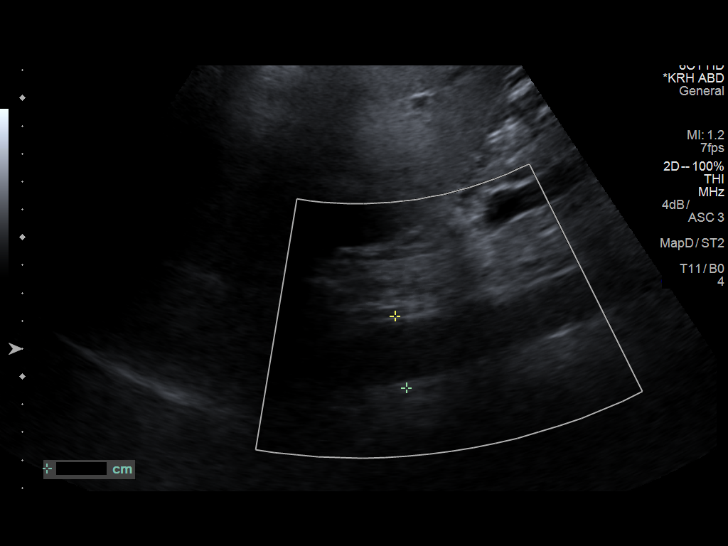
[im 3/22]
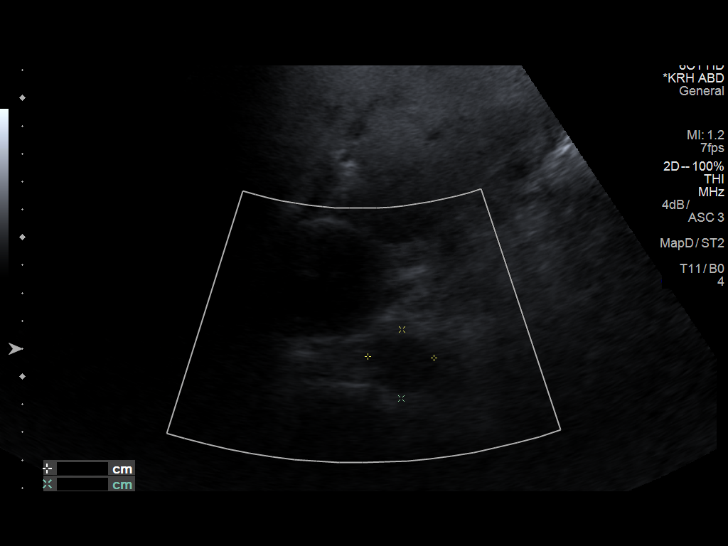
[im 4/22]
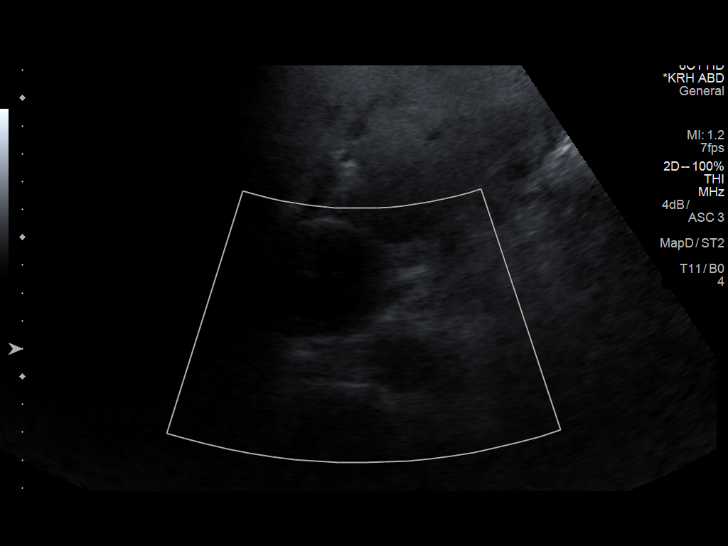
[im 6/22]
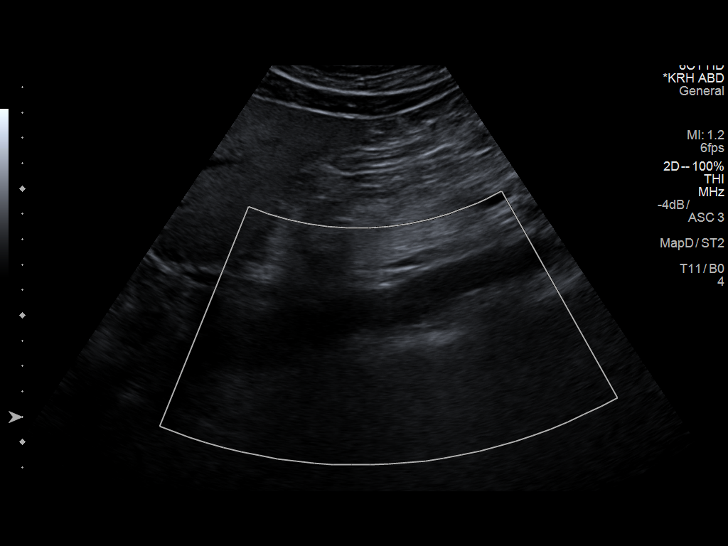
[im 8/22]
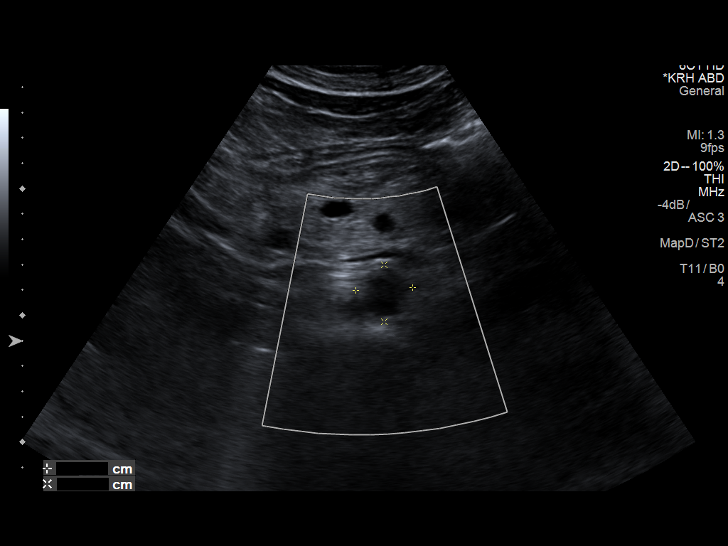
[im 9/22]
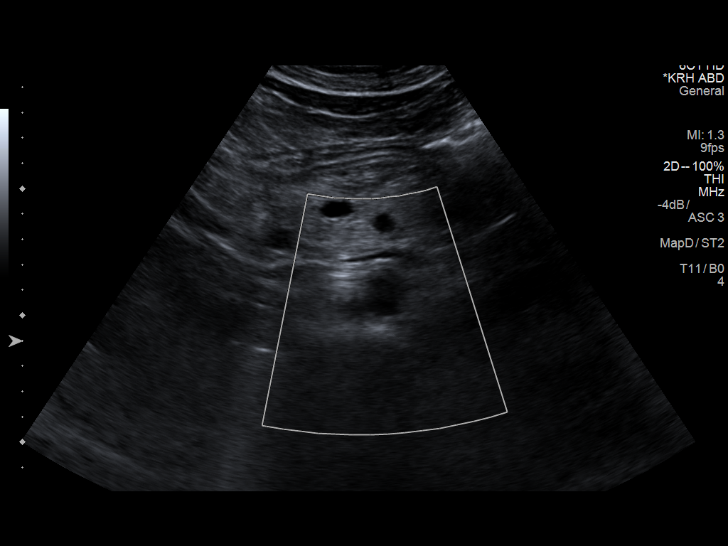
[im 11/22]
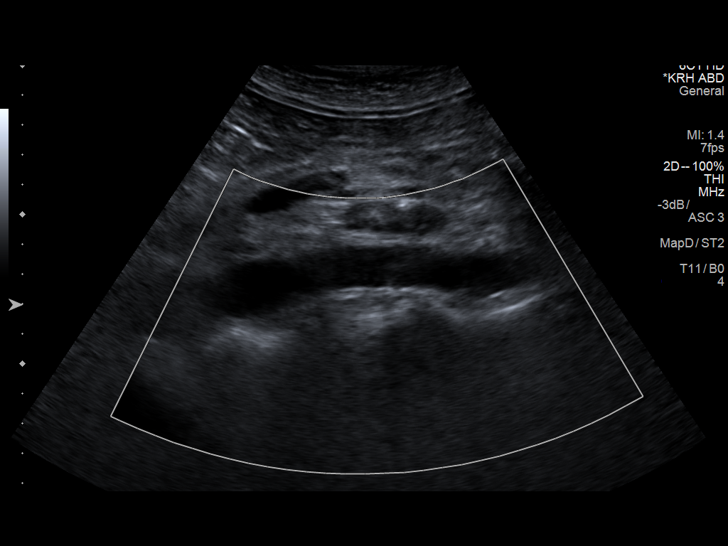
[im 12/22]
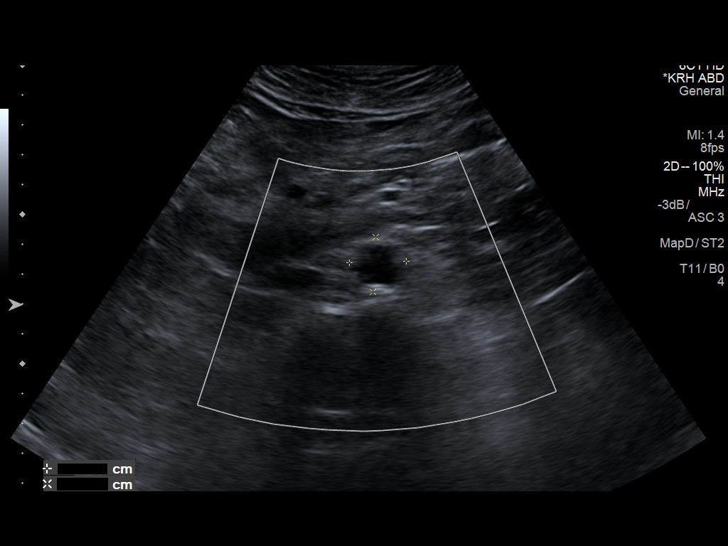
[im 14/22]
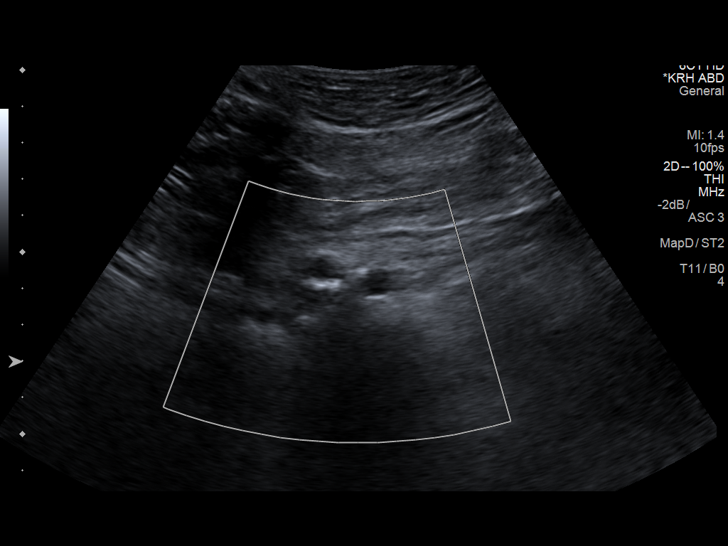
[im 15/22]
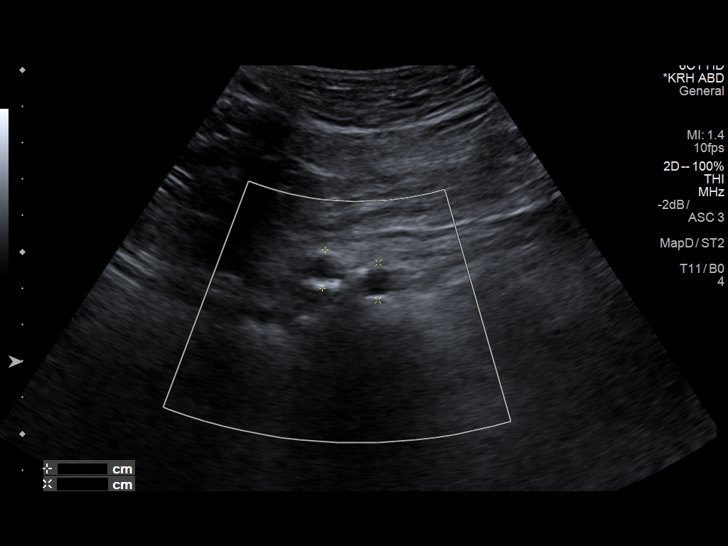
[im 17/22]
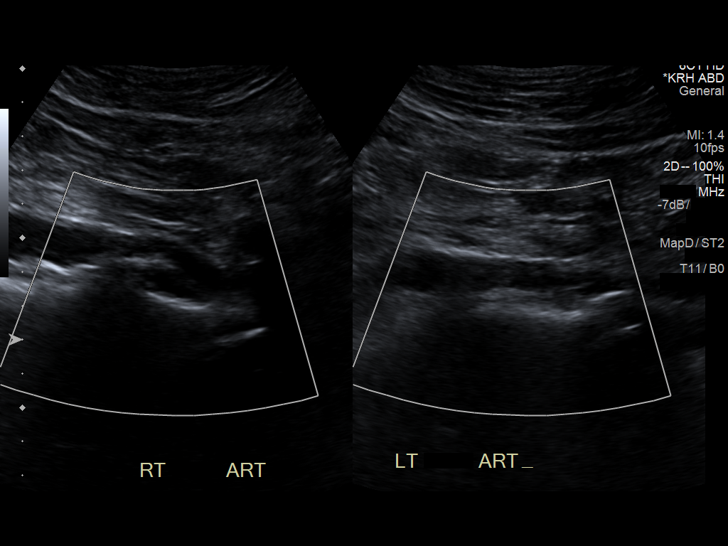
[im 19/22]
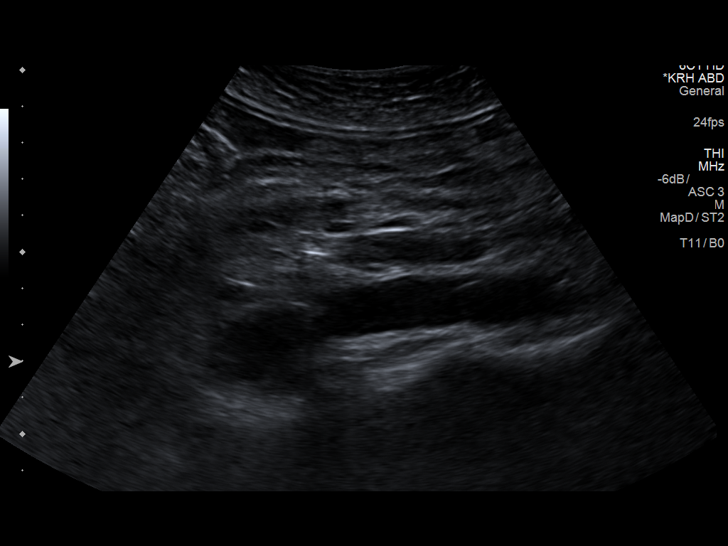
[im 20/22]
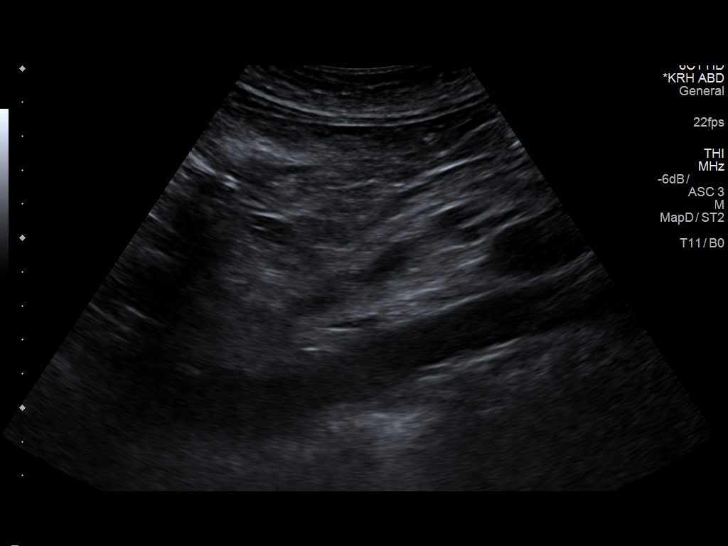
[im 22/22]
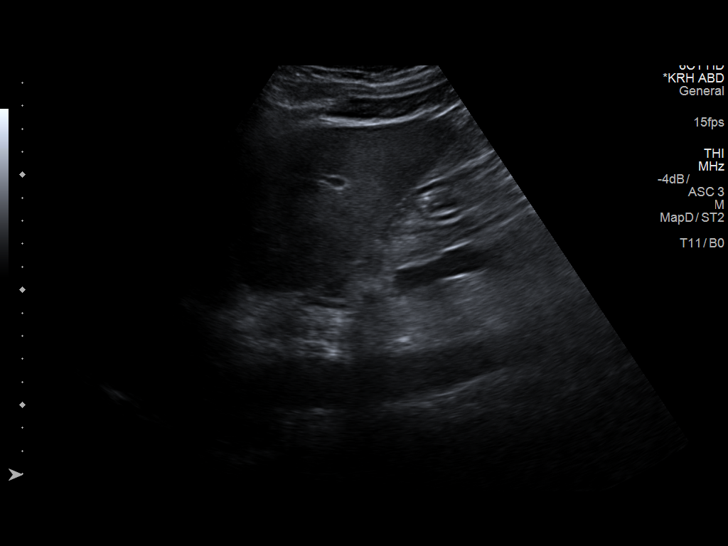

[14 of 22 positions shown; findings below may reference images not displayed]

FINDINGS: Abdominal aortic measurements as follows:

Proximal:  2.6 cm

Mid:  2.4 cm

Distal:  1.9 cm
IMPRESSION: Maximal diameter of the abdominal aorta is 2.6 cm. Ectatic abdominal
aorta at risk for aneurysm development. Recommend followup by
ultrasound in 5 years. This recommendation follows ACR consensus
guidelines: White Paper of the ACR Incidental Findings Committee II

## 2019-10-24 DIAGNOSIS — H2511 Age-related nuclear cataract, right eye: Secondary | ICD-10-CM | POA: Diagnosis not present

## 2019-10-24 DIAGNOSIS — H2513 Age-related nuclear cataract, bilateral: Secondary | ICD-10-CM | POA: Diagnosis not present

## 2019-11-09 ENCOUNTER — Ambulatory Visit: Payer: Medicare Other | Admitting: Family Medicine

## 2019-11-16 ENCOUNTER — Ambulatory Visit: Payer: Medicare Other | Admitting: Family Medicine

## 2019-11-19 ENCOUNTER — Ambulatory Visit: Payer: Medicare Other | Admitting: Family Medicine

## 2019-12-06 ENCOUNTER — Telehealth: Payer: Self-pay

## 2019-12-06 NOTE — Telephone Encounter (Signed)
Primary Cardiologist:Brian Stanford Breed, MD  Chart reviewed as part of pre-operative protocol coverage. Because of Troy Boyer's past medical history and time since last visit, he/she will require a follow-up visit in order to better assess preoperative cardiovascular risk.  Pre-op covering staff: - Please schedule appointment and call patient to inform them. - Please contact requesting surgeon's office via preferred method (i.e, phone, fax) to inform them of need for appointment prior to surgery.  If applicable, this message will also be routed to pharmacy pool and/or primary cardiologist for input on holding anticoagulant/antiplatelet agent as requested below so that this information is available at time of patient's appointment.   Deberah Pelton, NP  12/06/2019, 12:35 PM

## 2019-12-06 NOTE — Telephone Encounter (Signed)
   [facility] Pre-operative Risk Assessment    HEARTCARE STAFF: - Please ensure there is not already an duplicate clearance open for this procedure. - Under Visit Info/Reason for Call, type in Other and utilize the format Clearance MM/DD/YY or Clearance TBD. Do not use dashes or single digits. - If request is for dental extraction, please clarify the # of teeth to be extracted.  Request for surgical clearance:  1. What type of surgery is being performed? RIGHT/LEFT CATARACT W/INTRAOCULAR LENS IMPLANTATION   2. When is this surgery scheduled? OD 01-03-2020  OS 01-17-2020   3. What type of clearance is required (medical clearance vs. Pharmacy clearance to hold med vs. Both)? MEDICAL  4. Are there any medications that need to be held prior to surgery and how long?NONE   5. Practice name and name of physician performing surgery? [facility]  DR LISA SUN     6. What is the office phone number? [phone]   7.   What is the office fax number? [phone]  8.   Anesthesia type (None, local, MAC, general) ? TOPICAL ANESTHESIA W/ IV MEDICATION

## 2019-12-06 NOTE — Telephone Encounter (Signed)
Forwarded to requesting provider to notify that pt needs an appt for cardiac clearance  Called pt and scheduled appt for 12-25-2019 @1145  with KL. Pt will arrive early, wearing a mask

## 2019-12-10 ENCOUNTER — Encounter: Payer: Self-pay | Admitting: Family Medicine

## 2019-12-10 ENCOUNTER — Ambulatory Visit (INDEPENDENT_AMBULATORY_CARE_PROVIDER_SITE_OTHER): Payer: Medicare Other | Admitting: Family Medicine

## 2019-12-10 ENCOUNTER — Other Ambulatory Visit: Payer: Self-pay

## 2019-12-10 VITALS — BP 110/62 | HR 57 | Temp 95.8°F | Ht 64.0 in | Wt 163.4 lb

## 2019-12-10 DIAGNOSIS — I10 Essential (primary) hypertension: Secondary | ICD-10-CM | POA: Diagnosis not present

## 2019-12-10 DIAGNOSIS — E119 Type 2 diabetes mellitus without complications: Secondary | ICD-10-CM

## 2019-12-10 DIAGNOSIS — I2510 Atherosclerotic heart disease of native coronary artery without angina pectoris: Secondary | ICD-10-CM

## 2019-12-10 LAB — LIPID PANEL
Cholesterol: 88 mg/dL (ref 0–200)
HDL: 29.9 mg/dL — ABNORMAL LOW (ref >39.00)
LDL Cholesterol: 35 mg/dL (ref 0–99)
NonHDL: 57.97
Total CHOL/HDL Ratio: 3
Triglycerides: 115 mg/dL (ref 0.0–149.0)
VLDL: 23 mg/dL (ref 0.0–40.0)

## 2019-12-10 LAB — HEMOGLOBIN A1C: Hgb A1c MFr Bld: 7.4 % — ABNORMAL HIGH (ref 4.6–6.5)

## 2019-12-10 MED ORDER — NITROGLYCERIN 0.4 MG SL SUBL: 0.4000 mg | SUBLINGUAL_TABLET | SUBLINGUAL | 0 refills | Status: DC | PRN

## 2019-12-10 NOTE — Progress Notes (Signed)
Subjective:   Chief Complaint  Patient presents with  . Follow-up    Troy Boyer is a 69 y.o. male here for follow-up of diabetes.   Sigmund's self monitored glucose range is 130-140's Patient denies hypoglycemic reactions. He checks his glucose levels 1x/week.  Patient does not require insulin.   Medications include: Diet controlled Diet is healthy overall. Exercise: walking  Hypertension Patient presents for hypertension follow up. He does monitor home blood pressures. He is compliant with medications. Patient has these side effects of medication: none Diet/exercise as above.   Past Medical History:  Diagnosis Date  . Adenomatous colon polyp    tubular  . CAD (coronary artery disease)    a. Cath 06/08/01 at [facility] with normal LM and LAD, 95% LCx s/p Circumflex stent 4.0 x 15 mm Penta and 85% and 80% prox RCA s/p 3.5 x 23 mm Penta b. cath 01/20/2015 95% prox LCx ISR treated with DES, 55% prox to mid RCA, 45% distal RCA, 40% midLAD, EF 55%  . Diabetes mellitus without complication ([place])   . Diverticulosis   . Erectile dysfunction   . Gallstones   . Hyperlipidemia   . Hypertension      Related testing: Date of retinal exam: Done Pneumovax: done  Objective:  BP 110/62 (BP Location: Left Arm, Patient Position: Sitting, Cuff Size: Normal)   Pulse (!) 57   Temp (!) 95.8 F (35.4 C) (Temporal)   Ht 5\' 4"  (1.626 m)   Wt 163 lb 6 oz (74.1 kg)   SpO2 98%   BMI 28.04 kg/m  General:  Well developed, well nourished, in no apparent distress Head:  Normocephalic, atraumatic Eyes:  Pupils equal and round, sclera anicteric without injection  Lungs:  CTAB, no access msc use Cardio:  RRR, no bruits, no LE edema Psych: Age appropriate judgment and insight  Assessment:   Non-insulin dependent type 2 diabetes mellitus (HCC) - Plan: Hemoglobin A1c, Lipid panel  Essential hypertension  Coronary artery disease involving native coronary artery of native heart without  angina pectoris - Plan: nitroGLYCERIN (NITROSTAT) 0.4 MG SL tablet   Plan:   1- Has lost weight due to increased physical exercise. Likely will do well as diet controlled.l 2- Monitor BP at home. If lowering, will take away BP meds.  3- Refill nitro as this is expired.  F/u in 6 mo for CPE or prn. The patient voiced understanding and agreement to the plan.  [place], DO 12/10/19 10:47 AM

## 2019-12-10 NOTE — Patient Instructions (Addendum)
Keep the diet clean and stay active. Consider adding weight resistance exercise. Consider touring your gym to see what they are doing.   Give [place] 2-3 business days to get the results of your labs back.   Keep an eye on your blood pressure at home as you continue to lose weight. If it drops below 110 consistently on the top and/or 75 or lower on the bottom, let me know.   Let us know if you need anything.

## 2019-12-19 ENCOUNTER — Ambulatory Visit: Payer: Medicare Other | Admitting: Cardiology

## 2019-12-22 ENCOUNTER — Other Ambulatory Visit: Payer: Self-pay | Admitting: Family Medicine

## 2019-12-22 DIAGNOSIS — G4700 Insomnia, unspecified: Secondary | ICD-10-CM

## 2019-12-24 NOTE — Telephone Encounter (Signed)
Last OV--6//14/2021 Last RF---#90 with 3 refills on 02/02/2019

## 2019-12-25 ENCOUNTER — Other Ambulatory Visit: Payer: Self-pay

## 2019-12-25 ENCOUNTER — Ambulatory Visit: Payer: Medicare Other | Admitting: Adult Health

## 2019-12-25 ENCOUNTER — Encounter: Payer: Self-pay | Admitting: Adult Health

## 2019-12-25 VITALS — BP 128/70 | HR 62 | Ht 64.0 in | Wt 163.6 lb

## 2019-12-25 DIAGNOSIS — I2510 Atherosclerotic heart disease of native coronary artery without angina pectoris: Secondary | ICD-10-CM | POA: Diagnosis not present

## 2019-12-25 DIAGNOSIS — Z01810 Encounter for preprocedural cardiovascular examination: Secondary | ICD-10-CM

## 2019-12-25 DIAGNOSIS — E7800 Pure hypercholesterolemia, unspecified: Secondary | ICD-10-CM

## 2019-12-25 NOTE — Patient Instructions (Signed)
Medication Instructions:  Continue current medications  *If you need a refill on your cardiac medications before your next appointment, please call your pharmacy*   Lab Work: None Ordered   Testing/Procedures: None Ordered   Follow-Up: At [facility], you and your health needs are our priority.  As part of our continuing mission to provide you with exceptional heart care, we have created designated Provider Care Teams.  These Care Teams include your primary Cardiologist (physician) and Advanced Practice Providers (APPs -  Physician Assistants and Nurse Practitioners) who all work together to provide you with the care you need, when you need it.  We recommend signing up for the patient portal called "MyChart".  Sign up information is provided on this After Visit Summary.  MyChart is used to connect with patients for Virtual Visits (Telemedicine).  Patients are able to view lab/test results, encounter notes, upcoming appointments, etc.  Non-urgent messages can be sent to your provider as well.   To learn more about what you can do with MyChart, go to [email or web address].    Your next appointment:   November with Dr Stanford Breed  The format for your next appointment:   In Person

## 2019-12-25 NOTE — Progress Notes (Signed)
Cardiology Office Note   Date:  12/25/2019   ID:  Troy Boyer, DOB 30-Sep-1950, MRN [id number]  PCP:  Shelda Pal, DO  Cardiologist:  Stanford Breed  CC: Follow up    History of Present Illness: Troy Boyer is a 69 y.o. male who presents for preoperative evaluation prior to cataract lens implantation scheduled for 01/03/2020 and 01/17/2020 by [place] [facility] [place] with Dr. Nancy Fetter.  Mr. Troy Boyer woman has a history of coronary artery disease with non-ST elevation myocardial infarction in 2002.  He had cardiac catheterization at that time that revealed 80% proximal right coronary artery followed by 85% with a 90% circumflex.  LV function was normal.  Patient had PCI of the circumflex and right coronary artery.  He was admitted again in 716 with NSTEMI.  Cardiac catheterization revealed the proximal circumflex had 95% in-stent restenosis which was treated again with a drug-eluting stent.  He had residual CAD to include mid LAD and 40% proximal RCA stent was patent with 25% in-stent restenosis, mid RCA was 55%, distal RCA was 45%, RPDA was 30%.  EF was preserved.  He had a repeat cardiac catheterization August 2017 secondary recurrent chest pain which revealed moderate coronary artery disease without significant obstruction.  Medical therapy was recommended.  He had a head CT which showed possible normal pressure hydrocephalus but neurology did not think further therapy was indicated. The patient was found to have a normal pressure hydrocephalus via CT scan in 2017 but neurology did not think further therapy was indicated.  He additionally had ERCP with sphincterotomy for cholelithiasis in August 2017.  Abdominal ultrasound in 04/2017 showed ectatic aorta (2.6 cm).  He was to have follow-up in 5 years.  He was last seen by Dr. Stanford Breed on 05/16/2019 at which time he did not have any complaints of chest pain palpitations syncope or dyspnea.  The patient LDL was 87 at the time and  Zetia 10 mg was added.  Past Medical History:  Diagnosis Date  . Adenomatous colon polyp    tubular  . CAD (coronary artery disease)    a. Cath 06/08/01 at [facility] with normal LM and LAD, 95% LCx s/p Circumflex stent 4.0 x 15 mm Penta and 85% and 80% prox RCA s/p 3.5 x 23 mm Penta b. cath 01/20/2015 95% prox LCx ISR treated with DES, 55% prox to mid RCA, 45% distal RCA, 40% midLAD, EF 55%  . Diabetes mellitus without complication ([place])   . Diverticulosis   . Erectile dysfunction   . Gallstones   . Hyperlipidemia   . Hypertension     Past Surgical History:  Procedure Laterality Date  . ANGIOPLASTY  2002   2 stents  . CARDIAC CATHETERIZATION N/A 01/20/2015   Procedure: Left Heart Cath and Coronary Angiography;  Surgeon: Belva Crome, MD;  Location: [place] [facility];  Service: Cardiovascular;  Laterality: N/A;  . CARDIAC CATHETERIZATION N/A 01/27/2016   Procedure: Left Heart Cath and Coronary Angiography;  Surgeon: Sherren Mocha, MD;  Location: [place] [facility];  Service: Cardiovascular;  Laterality: N/A;  . CHOLECYSTECTOMY N/A 02/09/2016   Procedure: LAPAROSCOPIC CHOLECYSTECTOMY WITH INTRAOPERATIVE CHOLANGIOGRAM;  Surgeon: Erroll Luna, MD;  Location: [place];  Service: General;  Laterality: N/A;  . COLONOSCOPY W/ POLYPECTOMY  08/2013   Avg risk screening, Dr Thomasenia Sales. 5 mm tubular adenoma ascending.  Mild to moderated descending and sigmoid tics.  Internal hemorrhoids  . CORONARY ANGIOPLASTY WITH STENT PLACEMENT    . ERCP  N/A 02/13/2016   Procedure: ENDOSCOPIC RETROGRADE CHOLANGIOPANCREATOGRAPHY (ERCP);  Surgeon: Doran Stabler, MD;  Location: [place];  Service: Gastroenterology;  Laterality: N/A;  . ROTATOR CUFF REPAIR Right 2009  . ROTATOR CUFF REPAIR Left 2014  . TRIGGER FINGER RELEASE Bilateral 2013   4 fingers, middle finger on both hands     Current Outpatient Medications  Medication Sig Dispense Refill  . acetaminophen (TYLENOL) 500 MG tablet Take 1,000  mg by mouth every 6 (six) hours as needed for headache (pain).    Marland Kitchen amLODipine (NORVASC) 5 MG tablet TAKE 1 TABLET BY MOUTH  DAILY 90 tablet 3  . aspirin EC 81 MG tablet Take 81 mg by mouth daily.    Marland Kitchen atorvastatin (LIPITOR) 80 MG tablet TAKE 1 TABLET BY MOUTH AT  BEDTIME 90 tablet 3  . clomiPHENE (CLOMID) 50 MG tablet Take by mouth daily.    Marland Kitchen ezetimibe (ZETIA) 10 MG tablet Take 1 tablet (10 mg total) by mouth daily. 90 tablet 3  . fenofibrate (TRICOR) 48 MG tablet TAKE 1 TABLET BY MOUTH  DAILY 90 tablet 3  . glucose blood (ONETOUCH VERIO) test strip Use once a week to check blood sugar 100 each 0  . Krill Oil 500 MG CAPS Take 500 mg by mouth 2 (two) times daily.    . metoprolol tartrate (LOPRESSOR) 25 MG tablet TAKE 1 TABLET BY MOUTH  DAILY 90 tablet 2  . Multiple Vitamin (MULTIVITAMIN WITH MINERALS) TABS tablet Take 1 tablet by mouth daily.    . nitroGLYCERIN (NITROSTAT) 0.4 MG SL tablet Place 1 tablet (0.4 mg total) under the tongue every 5 (five) minutes as needed for chest pain. 25 tablet 0  . ramipril (ALTACE) 10 MG capsule TAKE 1 CAPSULE BY MOUTH TWO TIMES DAILY 180 capsule 2  . RELION LANCETS MICRO-THIN 33G MISC Use once a week to check blood sugar 100 each 0  . tadalafil (CIALIS) 20 MG tablet Take 1 tablet (20 mg total) by mouth daily as needed for erectile dysfunction. 30 tablet 2  . traZODone (DESYREL) 50 MG tablet TAKE 1 TABLET BY MOUTH AT  BEDTIME AS NEEDED FOR SLEEP 90 tablet 3   No current facility-administered medications for this visit.    Allergies:   Patient has no known allergies.    Social History:  The patient  reports that he quit smoking about 4 years ago. His smoking use included cigarettes. He has a 44.00 pack-year smoking history. He has never used smokeless tobacco. He reports current alcohol use. He reports that he does not use drugs.   Family History:  The patient's family history includes CAD (age of onset: 69) in his brother; CAD (age of onset: 46) in his  sister; Crohn's disease in his mother; Dementia in his mother; Diabetes in his brother and father; Heart disease (age of onset: 23) in his father; Kidney disease in his brother.    ROS: All other systems are reviewed and negative. Unless otherwise mentioned in H&P    PHYSICAL EXAM: VS:  BP 128/70   Pulse 62   Ht 5\' 4"  (1.626 m)   Wt 163 lb 9.6 oz (74.2 kg)   BMI 28.08 kg/m  , BMI Body mass index is 28.08 kg/m. GEN: Well nourished, well developed, in no acute distress HEENT: normal Neck: no JVD, carotid bruits, or masses Cardiac: RRR; no murmurs, rubs, or gallops,no edema  Respiratory:  Clear to auscultation bilaterally, normal work of breathing GI: soft, nontender, nondistended, +  BS MS: no deformity or atrophy Skin: warm and dry, no rash Neuro:  Strength and sensation are intact Psych: euthymic mood, full affect   EKG: NSR, 62 bpm. LAE, LAFB. Rate of 62 bpm.  Recent Labs: 05/18/2019: Hemoglobin 15.2; Platelets 249.0 08/16/2019: ALT 29; BUN 12; Creatinine, Ser 0.97; Potassium 4.4; Sodium 143    Lipid Panel    Component Value Date/Time   CHOL 88 12/10/2019 1058   CHOL 106 08/16/2019 1142   TRIG 115.0 12/10/2019 1058   HDL 29.90 (L) 12/10/2019 1058   HDL 34 (L) 08/16/2019 1142   CHOLHDL 3 12/10/2019 1058   VLDL 23.0 12/10/2019 1058   LDLCALC 35 12/10/2019 1058   LDLCALC 45 08/16/2019 1142   LDLDIRECT 74.0 05/18/2019 1053      Wt Readings from Last 3 Encounters:  12/25/19 163 lb 9.6 oz (74.2 kg)  12/10/19 163 lb 6 oz (74.1 kg)  08/27/19 171 lb (77.6 kg)      Other studies Reviewed: LHC 01/27/2016 Conclusion    Prox RCA to Mid RCA lesion, 20 %stenosed.  Mid RCA lesion, 50 %stenosed.  Ost Cx to Mid Cx lesion, 0 %stenosed.  Mid LAD lesion, 30 %stenosed.  Prox LAD lesion, 25 %stenosed.  The left ventricular systolic function is normal.  LV end diastolic pressure is normal.  The left ventricular ejection fraction is 55-65% by visual estimate.   1.  Continued patency of the stented segments in the left circumflex and RCA 2. Moderate mid-RCA stenosis without change from the previous study 3. Patent LAD with mild diffuse irregularity 4. Normal LV systolic function with normal LVEDP  No clear culprit for NSTEMI. Recommend continued medical therapy. Patient has complained of headache and vision changes (now resolved) prior to cath. Will check non-contrast head CT.    ASSESSMENT AND PLAN:  1. Pre Operative Cardiac Evaluation:    Chart reviewed as part of pre-operative protocol coverage. Given past medical history and time since last visit, based on ACC/AHA guidelines, Howell Groesbeck would be at acceptable risk for the planned procedure without further cardiovascular testing.   2. CAD: He denies complaints of angina symptoms. He is medically compliant. No changes in regimen.  3. Hypercholesterolemia: Labs have been drawn by Dr. Nani Ravens.  No changes on review of labs. Goal of LDL < 70.   Current medicines are reviewed at length with the patient today.  I have spent 25 minutes dedicated to the care of this patient on the date of this encounter to include pre-visit review of records, assessment, management and diagnostic testing,with shared decision making.  Labs/ tests ordered today include: None  Phill Myron. West Pugh, ANP, AACC   12/25/2019 1:04 PM    [facility] [address] Office [phone] Fax [phone]  Notice: This dictation was prepared with Dragon dictation along with smaller phrase technology. Any transcriptional errors that result from this process are unintentional and may not be corrected upon review.

## 2020-01-01 ENCOUNTER — Other Ambulatory Visit: Payer: Self-pay | Admitting: Family Medicine

## 2020-01-01 DIAGNOSIS — I10 Essential (primary) hypertension: Secondary | ICD-10-CM

## 2020-01-02 ENCOUNTER — Other Ambulatory Visit: Payer: Self-pay | Admitting: Family Medicine

## 2020-01-02 DIAGNOSIS — I2510 Atherosclerotic heart disease of native coronary artery without angina pectoris: Secondary | ICD-10-CM

## 2020-01-03 DIAGNOSIS — H2513 Age-related nuclear cataract, bilateral: Secondary | ICD-10-CM | POA: Diagnosis not present

## 2020-01-03 DIAGNOSIS — H52201 Unspecified astigmatism, right eye: Secondary | ICD-10-CM | POA: Diagnosis not present

## 2020-01-03 DIAGNOSIS — H2511 Age-related nuclear cataract, right eye: Secondary | ICD-10-CM | POA: Diagnosis not present

## 2020-01-04 DIAGNOSIS — H2512 Age-related nuclear cataract, left eye: Secondary | ICD-10-CM | POA: Diagnosis not present

## 2020-01-04 DIAGNOSIS — H25012 Cortical age-related cataract, left eye: Secondary | ICD-10-CM | POA: Diagnosis not present

## 2020-01-04 DIAGNOSIS — H25042 Posterior subcapsular polar age-related cataract, left eye: Secondary | ICD-10-CM | POA: Diagnosis not present

## 2020-01-10 DIAGNOSIS — H2513 Age-related nuclear cataract, bilateral: Secondary | ICD-10-CM | POA: Diagnosis not present

## 2020-01-17 DIAGNOSIS — H2512 Age-related nuclear cataract, left eye: Secondary | ICD-10-CM | POA: Diagnosis not present

## 2020-01-17 DIAGNOSIS — H52202 Unspecified astigmatism, left eye: Secondary | ICD-10-CM | POA: Diagnosis not present

## 2020-01-19 DIAGNOSIS — E119 Type 2 diabetes mellitus without complications: Secondary | ICD-10-CM

## 2020-01-21 MED ORDER — ACCU-CHEK FASTCLIX LANCETS MISC: 12 refills | Status: DC

## 2020-01-24 DIAGNOSIS — H2513 Age-related nuclear cataract, bilateral: Secondary | ICD-10-CM | POA: Diagnosis not present

## 2020-01-26 MED ORDER — GLUCOSE BLOOD VI STRP: ORAL_STRIP | 12 refills | Status: DC

## 2020-01-26 NOTE — Addendum Note (Signed)
Addended by: Wynonia Musty A on: 01/26/2020 11:47 AM   Modules accepted: Orders

## 2020-01-28 ENCOUNTER — Telehealth: Payer: Self-pay | Admitting: Family Medicine

## 2020-01-28 DIAGNOSIS — E119 Type 2 diabetes mellitus without complications: Secondary | ICD-10-CM

## 2020-01-28 MED ORDER — GLUCOSE BLOOD VI STRP: ORAL_STRIP | 12 refills | Status: DC

## 2020-01-28 NOTE — Telephone Encounter (Signed)
Updated per pharmacy request

## 2020-01-28 NOTE — Telephone Encounter (Signed)
Accu-Chek FastClix Lancets MISC [[id number]   Pharmacy asking for clarification on prescription sent in on January 25, 2020 in regards to test strips.   Per prescription sent in , patient is to check blood glucose twice a day, patient states that he has never check glucose that often   Please Advise

## 2020-01-29 ENCOUNTER — Telehealth: Payer: Self-pay | Admitting: Family Medicine

## 2020-01-29 NOTE — Telephone Encounter (Signed)
New Message:   [name] is calling from [facility] and states they need clarification on how many times the pt is using her Accu-Check test strips. Please advise.  UEB#[id number]

## 2020-01-29 NOTE — Telephone Encounter (Signed)
Have clarified this

## 2020-02-06 MED ORDER — ONETOUCH VERIO VI STRP: ORAL_STRIP | 3 refills | Status: DC

## 2020-02-06 NOTE — Addendum Note (Signed)
Addended by: Sharon Seller B on: 02/06/2020 11:52 AM   Modules accepted: Orders

## 2020-02-10 ENCOUNTER — Other Ambulatory Visit: Payer: Self-pay | Admitting: Family Medicine

## 2020-02-17 ENCOUNTER — Other Ambulatory Visit: Payer: Self-pay | Admitting: Family Medicine

## 2020-03-19 ENCOUNTER — Other Ambulatory Visit: Payer: Self-pay | Admitting: Cardiology

## 2020-03-22 ENCOUNTER — Other Ambulatory Visit: Payer: Self-pay | Admitting: Cardiology

## 2020-03-22 DIAGNOSIS — E7800 Pure hypercholesterolemia, unspecified: Secondary | ICD-10-CM

## 2020-03-27 NOTE — Telephone Encounter (Signed)
Please advise 

## 2020-04-20 ENCOUNTER — Other Ambulatory Visit: Payer: Self-pay | Admitting: Cardiology

## 2020-05-16 NOTE — Progress Notes (Signed)
HPI: FU CAD. Previously followed in [facility]. Patient had a non-ST elevation myocardial infarction in 2002. Cardiac catheterization revealed an 80% proximal right coronary artery followed by an 85%. There was a 90% circumflex. LV function normal. Patient had PCI of his circumflex and right coronary artery. Nuclear study in July of 2012 showed an ejection fraction of 76%. No ischemia.  Admitted 7/16 with a NSTEMI. LHC demonstrated pCFX 95% ISR which was treated with a DES. Residual CAD included mLAD 40%, pRCA stent ok with 25% ISR, mRCA 55%, dRCA 45%, RPDA 30%. EF was preserved.Repeat catheterization August 2017 secondary to chest pain revealed moderate coronary disease without significant obstruction. Medical therapy recommended. LV function normal.Head CT showed possible normal pressure hydrocephalus but neurology did not think further therapy indicated. Abd ultrasound 11/18 showed ectatic aorta (2.6 cm). FU recommended 5 years.  Since last seen, the patient denies any dyspnea on exertion, orthopnea, PND, pedal edema, palpitations, syncope or chest pain.   Current Outpatient Medications  Medication Sig Dispense Refill  . Accu-Chek FastClix Lancets MISC Check blood sugars once or twice weekly 102 each 12  . acetaminophen (TYLENOL) 500 MG tablet Take 1,000 mg by mouth every 6 (six) hours as needed for headache (pain).    Marland Kitchen amLODipine (NORVASC) 5 MG tablet TAKE 1 TABLET BY MOUTH  DAILY 90 tablet 3  . aspirin EC 81 MG tablet Take 81 mg by mouth daily.    Marland Kitchen atorvastatin (LIPITOR) 80 MG tablet TAKE 1 TABLET BY MOUTH AT  BEDTIME 90 tablet 3  . clomiPHENE (CLOMID) 50 MG tablet Take by mouth daily.    Marland Kitchen ezetimibe (ZETIA) 10 MG tablet TAKE 1 TABLET BY MOUTH  DAILY 90 tablet 3  . fenofibrate (TRICOR) 48 MG tablet TAKE 1 TABLET BY MOUTH  DAILY 90 tablet 3  . Krill Oil 500 MG CAPS Take 500 mg by mouth 2 (two) times daily.    . metoprolol tartrate (LOPRESSOR) 25 MG tablet TAKE 1 TABLET BY  MOUTH  DAILY 90 tablet 0  . Multiple Vitamin (MULTIVITAMIN WITH MINERALS) TABS tablet Take 1 tablet by mouth daily.    . nitroGLYCERIN (NITROSTAT) 0.4 MG SL tablet Place 1 tablet (0.4 mg total) under the tongue every 5 (five) minutes as needed for chest pain. 25 tablet 0  . ramipril (ALTACE) 10 MG capsule TAKE 1 CAPSULE BY MOUTH  TWICE DAILY 180 capsule 3  . tadalafil (CIALIS) 20 MG tablet Take 1 tablet (20 mg total) by mouth daily as needed for erectile dysfunction. 30 tablet 2  . traZODone (DESYREL) 50 MG tablet TAKE 1 TABLET BY MOUTH AT  BEDTIME AS NEEDED FOR SLEEP 90 tablet 3   No current facility-administered medications for this visit.     Past Medical History:  Diagnosis Date  . Adenomatous colon polyp    tubular  . CAD (coronary artery disease)    a. Cath 06/08/01 at [facility] with normal LM and LAD, 95% LCx s/p Circumflex stent 4.0 x 15 mm Penta and 85% and 80% prox RCA s/p 3.5 x 23 mm Penta b. cath 01/20/2015 95% prox LCx ISR treated with DES, 55% prox to mid RCA, 45% distal RCA, 40% midLAD, EF 55%  . Diabetes mellitus without complication ([place])   . Diverticulosis   . Erectile dysfunction   . Gallstones   . Hyperlipidemia   . Hypertension     Past Surgical History:  Procedure Laterality Date  . ANGIOPLASTY  2002  2 stents  . CARDIAC CATHETERIZATION N/A 01/20/2015   Procedure: Left Heart Cath and Coronary Angiography;  Surgeon: Belva Crome, MD;  Location: [place] [facility];  Service: Cardiovascular;  Laterality: N/A;  . CARDIAC CATHETERIZATION N/A 01/27/2016   Procedure: Left Heart Cath and Coronary Angiography;  Surgeon: Sherren Mocha, MD;  Location: [place] [facility];  Service: Cardiovascular;  Laterality: N/A;  . CHOLECYSTECTOMY N/A 02/09/2016   Procedure: LAPAROSCOPIC CHOLECYSTECTOMY WITH INTRAOPERATIVE CHOLANGIOGRAM;  Surgeon: Erroll Luna, MD;  Location: [place];  Service: General;  Laterality: N/A;  . COLONOSCOPY W/ POLYPECTOMY  08/2013   Avg risk screening, Dr  Thomasenia Sales. 5 mm tubular adenoma ascending.  Mild to moderated descending and sigmoid tics.  Internal hemorrhoids  . CORONARY ANGIOPLASTY WITH STENT PLACEMENT    . ERCP N/A 02/13/2016   Procedure: ENDOSCOPIC RETROGRADE CHOLANGIOPANCREATOGRAPHY (ERCP);  Surgeon: Doran Stabler, MD;  Location: [place];  Service: Gastroenterology;  Laterality: N/A;  . ROTATOR CUFF REPAIR Right 2009  . ROTATOR CUFF REPAIR Left 2014  . TRIGGER FINGER RELEASE Bilateral 2013   4 fingers, middle finger on both hands    Social History   Socioeconomic History  . Marital status: Significant Other    Spouse name: Not on file  . Number of children: 2  . Years of education: 33  . Highest education level: Not on file  Occupational History  . 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 Smoking status: Former Smoker    Packs/day: 1.00    Years: 44.00    Pack years: 44.00    Types: Cigarettes    Quit date: 01/17/2015    Years since quitting: 5.3  . Smokeless tobacco: Never Used  Vaping Use  . Vaping Use: Never used  Substance and Sexual Activity  . Alcohol use: Yes    Comment: daily  . Drug use: No  . Sexual activity: Yes  Other Topics Concern  . Not on file  Social History Narrative   Fun: Boat, golf   Denies religious beliefs effecting health care.    Social Determinants of Health   Financial Resource Strain:   . Difficulty of Paying Living Expenses: Not on file  Food Insecurity:   . Worried About Charity fundraiser in the Last Year: Not on file  . Ran Out of Food in the Last Year: Not on file  Transportation Needs:   . Lack of Transportation (Medical): Not on file  . Lack of Transportation (Non-Medical): Not on file  Physical Activity:   . Days of Exercise per Week: Not on file  . Minutes of Exercise per Session: Not on file  Stress:   . Feeling of Stress : Not on file  Social Connections:   . Frequency of Communication with Friends and Family: Not on file  .  Frequency of Social Gatherings with Friends and Family: Not on file  . Attends Religious Services: Not on file  . Active Member of Clubs or Organizations: Not on file  . Attends Archivist Meetings: Not on file  . Marital Status: Not on file  Intimate Partner Violence:   . Fear of Current or Ex-Partner: Not on file  . Emotionally Abused: Not on file  . Physically Abused: Not on file  . Sexually Abused: Not on file    Family History  Problem Relation Age of Onset  . CAD Brother 25       Died age 61  .  Diabetes Brother   . Kidney disease Brother   . CAD Sister 41  . Heart disease Father 1       Valve replacement and CAD, CABG  . Diabetes Father   . Dementia Mother   . Crohn's disease Mother   . Colon cancer Neg Hx     ROS: no fevers or chills, productive cough, hemoptysis, dysphasia, odynophagia, melena, hematochezia, dysuria, hematuria, rash, seizure activity, orthopnea, PND, pedal edema, claudication. Remaining systems are negative.  Physical Exam: Well-developed well-nourished in no acute distress.  Skin is warm and dry.  HEENT is normal.  Neck is supple.  Chest is clear to auscultation with normal expansion.  Cardiovascular exam is regular rate and rhythm.  Abdominal exam nontender or distended. No masses palpated. Extremities show no edema. neuro grossly intact  ECG-normal sinus rhythm at a rate 65, left anterior fascicular block.  Personally reviewed  A/P  1 CAD-patient denies chest pain.  Continue aspirin and statin.  2 hypertension-blood pressure controlled.  Continue present medical regimen and follow.  Check potassium and renal function.  3 hyperlipidemia-continue present medications.  Check lipids and liver.  4 ectatic abdominal aorta-plan follow-up ultrasound November 2023.  Kirk Ruths, MD

## 2020-05-28 ENCOUNTER — Other Ambulatory Visit: Payer: Self-pay

## 2020-05-28 ENCOUNTER — Encounter: Payer: Self-pay | Admitting: Cardiology

## 2020-05-28 ENCOUNTER — Ambulatory Visit: Payer: Medicare Other | Admitting: Cardiology

## 2020-05-28 VITALS — BP 120/76 | HR 65 | Ht 64.0 in | Wt 165.0 lb

## 2020-05-28 DIAGNOSIS — E7800 Pure hypercholesterolemia, unspecified: Secondary | ICD-10-CM

## 2020-05-28 DIAGNOSIS — I10 Essential (primary) hypertension: Secondary | ICD-10-CM | POA: Diagnosis not present

## 2020-05-28 DIAGNOSIS — I2510 Atherosclerotic heart disease of native coronary artery without angina pectoris: Secondary | ICD-10-CM | POA: Diagnosis not present

## 2020-05-28 DIAGNOSIS — I714 Abdominal aortic aneurysm, without rupture, unspecified: Secondary | ICD-10-CM

## 2020-05-28 NOTE — Patient Instructions (Signed)
  Lab Work:  Your physician recommends that you HAVE LAB WORK TODAY  If you have labs (blood work) drawn today and your tests are completely normal, you will receive your results only by: MyChart Message (if you have MyChart) OR A paper copy in the mail If you have any lab test that is abnormal or we need to change your treatment, we will call you to review the results.   Follow-Up: At [facility], you and your health needs are our priority.  As part of our continuing mission to provide you with exceptional heart care, we have created designated Provider Care Teams.  These Care Teams include your primary Cardiologist (physician) and Advanced Practice Providers (APPs -  Physician Assistants and Nurse Practitioners) who all work together to provide you with the care you need, when you need it.  We recommend signing up for the patient portal called "MyChart".  Sign up information is provided on this After Visit Summary.  MyChart is used to connect with patients for Virtual Visits (Telemedicine).  Patients are able to view lab/test results, encounter notes, upcoming appointments, etc.  Non-urgent messages can be sent to your provider as well.   To learn more about what you can do with MyChart, go to [email or web address].    Your next appointment:   12 month(s)  The format for your next appointment:   In Person  Provider:   Brian Crenshaw, MD   

## 2020-05-29 LAB — COMPREHENSIVE METABOLIC PANEL
ALT: 20 IU/L (ref 0–44)
AST: 16 IU/L (ref 0–40)
Albumin/Globulin Ratio: 2.4 — ABNORMAL HIGH (ref 1.2–2.2)
Albumin: 4.7 g/dL (ref 3.8–4.8)
Alkaline Phosphatase: 36 IU/L — ABNORMAL LOW (ref 44–121)
BUN/Creatinine Ratio: 14 (ref 10–24)
BUN: 16 mg/dL (ref 8–27)
Bilirubin Total: 0.4 mg/dL (ref 0.0–1.2)
CO2: 21 mmol/L (ref 20–29)
Calcium: 9.6 mg/dL (ref 8.6–10.2)
Chloride: 103 mmol/L (ref 96–106)
Creatinine, Ser: 1.15 mg/dL (ref 0.76–1.27)
GFR calc Af Amer: 75 mL/min/{1.73_m2} (ref >59)
GFR calc non Af Amer: 65 mL/min/{1.73_m2} (ref >59)
Globulin, Total: 2 g/dL (ref 1.5–4.5)
Glucose: 150 mg/dL — ABNORMAL HIGH (ref 65–99)
Potassium: 4.9 mmol/L (ref 3.5–5.2)
Sodium: 141 mmol/L (ref 134–144)
Total Protein: 6.7 g/dL (ref 6.0–8.5)

## 2020-05-29 LAB — LIPID PANEL
Chol/HDL Ratio: 3.4 ratio (ref 0.0–5.0)
Cholesterol, Total: 113 mg/dL (ref 100–199)
HDL: 33 mg/dL — ABNORMAL LOW (ref >39)
LDL Chol Calc (NIH): 54 mg/dL (ref 0–99)
Triglycerides: 149 mg/dL (ref 0–149)
VLDL Cholesterol Cal: 26 mg/dL (ref 5–40)

## 2020-06-10 ENCOUNTER — Encounter: Payer: Self-pay | Admitting: Family Medicine

## 2020-06-10 ENCOUNTER — Other Ambulatory Visit: Payer: Self-pay

## 2020-06-10 ENCOUNTER — Ambulatory Visit (INDEPENDENT_AMBULATORY_CARE_PROVIDER_SITE_OTHER): Payer: Medicare Other | Admitting: Family Medicine

## 2020-06-10 VITALS — BP 120/62 | HR 68 | Temp 98.3°F | Ht 64.0 in | Wt 165.2 lb

## 2020-06-10 DIAGNOSIS — Z0000 Encounter for general adult medical examination without abnormal findings: Secondary | ICD-10-CM | POA: Diagnosis not present

## 2020-06-10 DIAGNOSIS — E1165 Type 2 diabetes mellitus with hyperglycemia: Secondary | ICD-10-CM | POA: Diagnosis not present

## 2020-06-10 LAB — HEMOGLOBIN A1C: Hgb A1c MFr Bld: 7.5 % — ABNORMAL HIGH (ref 4.6–6.5)

## 2020-06-10 NOTE — Progress Notes (Signed)
Chief Complaint  Patient presents with  . Follow-up    Well Male Troy Boyer is here for a complete physical.   His last physical was >1 year ago.  Current diet: in general, a "healthy" diet.   Current exercise: walking Weight trend: stable Fatigue out of ordinary? No. Seat belt? Yes.    Health maintenance Shingrix- Yes Colonoscopy- Yes Tetanus- Yes Hep C- Yes Pneumonia vaccine- Yes  Past Medical History:  Diagnosis Date  . Adenomatous colon polyp    tubular  . CAD (coronary artery disease)    a. Cath 06/08/01 at [facility] with normal LM and LAD, 95% LCx s/p Circumflex stent 4.0 x 15 mm Penta and 85% and 80% prox RCA s/p 3.5 x 23 mm Penta b. cath 01/20/2015 95% prox LCx ISR treated with DES, 55% prox to mid RCA, 45% distal RCA, 40% midLAD, EF 55%  . Diabetes mellitus without complication ([place])   . Diverticulosis   . Erectile dysfunction   . Gallstones   . Hyperlipidemia   . Hypertension      Past Surgical History:  Procedure Laterality Date  . ANGIOPLASTY  2002   2 stents  . CARDIAC CATHETERIZATION N/A 01/20/2015   Procedure: Left Heart Cath and Coronary Angiography;  Surgeon: Belva Crome, MD;  Location: [place] [facility];  Service: Cardiovascular;  Laterality: N/A;  . CARDIAC CATHETERIZATION N/A 01/27/2016   Procedure: Left Heart Cath and Coronary Angiography;  Surgeon: Sherren Mocha, MD;  Location: [place] [facility];  Service: Cardiovascular;  Laterality: N/A;  . CHOLECYSTECTOMY N/A 02/09/2016   Procedure: LAPAROSCOPIC CHOLECYSTECTOMY WITH INTRAOPERATIVE CHOLANGIOGRAM;  Surgeon: Erroll Luna, MD;  Location: [place];  Service: General;  Laterality: N/A;  . COLONOSCOPY W/ POLYPECTOMY  08/2013   Avg risk screening, Dr Thomasenia Sales. 5 mm tubular adenoma ascending.  Mild to moderated descending and sigmoid tics.  Internal hemorrhoids  . CORONARY ANGIOPLASTY WITH STENT PLACEMENT    . ERCP N/A 02/13/2016   Procedure: ENDOSCOPIC RETROGRADE CHOLANGIOPANCREATOGRAPHY  (ERCP);  Surgeon: Doran Stabler, MD;  Location: [facility];  Service: Gastroenterology;  Laterality: N/A;  . ROTATOR CUFF REPAIR Right 2009  . ROTATOR CUFF REPAIR Left 2014  . TRIGGER FINGER RELEASE Bilateral 2013   4 fingers, middle finger on both hands    Medications  Current Outpatient Medications on File Prior to Visit  Medication Sig Dispense Refill  . Accu-Chek FastClix Lancets MISC Check blood sugars once or twice weekly 102 each 12  . acetaminophen (TYLENOL) 500 MG tablet Take 1,000 mg by mouth every 6 (six) hours as needed for headache (pain).    Marland Kitchen amLODipine (NORVASC) 5 MG tablet TAKE 1 TABLET BY MOUTH  DAILY 90 tablet 3  . aspirin EC 81 MG tablet Take 81 mg by mouth daily.    Marland Kitchen atorvastatin (LIPITOR) 80 MG tablet TAKE 1 TABLET BY MOUTH AT  BEDTIME 90 tablet 3  . clomiPHENE (CLOMID) 50 MG tablet Take by mouth daily.    Marland Kitchen ezetimibe (ZETIA) 10 MG tablet TAKE 1 TABLET BY MOUTH  DAILY 90 tablet 3  . fenofibrate (TRICOR) 48 MG tablet TAKE 1 TABLET BY MOUTH  DAILY 90 tablet 3  . Krill Oil 500 MG CAPS Take 500 mg by mouth 2 (two) times daily.    . metoprolol tartrate (LOPRESSOR) 25 MG tablet TAKE 1 TABLET BY MOUTH  DAILY 90 tablet 0  . Multiple Vitamin (MULTIVITAMIN WITH MINERALS) TABS tablet Take 1 tablet by mouth daily.    Marland Kitchen  nitroGLYCERIN (NITROSTAT) 0.4 MG SL tablet Place 1 tablet (0.4 mg total) under the tongue every 5 (five) minutes as needed for chest pain. 25 tablet 0  . ramipril (ALTACE) 10 MG capsule TAKE 1 CAPSULE BY MOUTH  TWICE DAILY 180 capsule 3  . tadalafil (CIALIS) 20 MG tablet Take 1 tablet (20 mg total) by mouth daily as needed for erectile dysfunction. 30 tablet 2  . traZODone (DESYREL) 50 MG tablet TAKE 1 TABLET BY MOUTH AT  BEDTIME AS NEEDED FOR SLEEP 90 tablet 3   Allergies No Known Allergies  Family History Family History  Problem Relation Age of Onset  . CAD Brother 69       Died age 55  . Diabetes Brother   . Kidney disease Brother   . CAD Sister  73  . Heart disease Father 70       Valve replacement and CAD, CABG  . Diabetes Father   . Dementia Mother   . Crohn's disease Mother   . Colon cancer Neg Hx     Review of Systems: Constitutional:  no fevers Eye:  no recent significant change in vision Ears:  No changes in hearing Nose/Mouth/Throat:  no complaints of nasal congestion, no sore throat Cardiovascular: no chest pain Respiratory:  No shortness of breath Gastrointestinal:  No change in bowel habits GU:  No frequency Integumentary:  no abnormal skin lesions reported Neurologic:  no headaches Endocrine:  denies unexplained weight changes  Exam BP 120/62 (BP Location: Left Arm, Patient Position: Sitting, Cuff Size: Normal)   Pulse 68   Temp 98.3 F (36.8 C) (Oral)   Ht 5\' 4"  (1.626 m)   Wt 165 lb 4 oz (75 kg)   SpO2 98%   BMI 28.37 kg/m  General:  well developed, well nourished, in no apparent distress Skin: no lesions on feet b/l; no significant moles, warts, or growths Head:  no masses, lesions, or tenderness Eyes:  pupils equal and round, sclera anicteric without injection Ears:  canals without lesions, TMs shiny without retraction, no obvious effusion, no erythema Nose:  nares patent, septum midline, mucosa normal Throat/Pharynx:  lips and gingiva without lesion; tongue and uvula midline; non-inflamed pharynx; no exudates or postnasal drainage Lungs:  clear to auscultation, breath sounds equal bilaterally, no respiratory distress Cardio:  regular rate and rhythm, no LE edema or bruits Rectal: Deferred GI: BS+, S, NT, ND, no masses or organomegaly Musculoskeletal:  symmetrical muscle groups noted without atrophy or deformity Neuro:  gait normal; deep tendon reflexes normal and symmetric; sensation intact to pinprick b/l feet Psych: well oriented with normal range of affect and appropriate judgment/insight  Assessment and Plan  Well adult exam  Type 2 diabetes mellitus with hyperglycemia, without long-term  current use of insulin (HCC) - Plan: Hemoglobin A1c   Well 69 y.o. male. Counseled on diet and exercise. Goal A1c is <8. Other orders as above. Follow up in 6 mo or prn.  The patient voiced understanding and agreement to the plan.  [place], DO 06/10/20 11:16 AM

## 2020-06-10 NOTE — Patient Instructions (Signed)
Give us 2-3 business days to get the results of your labs back.   Keep the diet clean and stay active.  Let us know if you need anything. 

## 2020-06-28 ENCOUNTER — Other Ambulatory Visit: Payer: Self-pay | Admitting: Cardiology

## 2020-07-09 DIAGNOSIS — Z808 Family history of malignant neoplasm of other organs or systems: Secondary | ICD-10-CM | POA: Diagnosis not present

## 2020-07-09 DIAGNOSIS — D2271 Melanocytic nevi of right lower limb, including hip: Secondary | ICD-10-CM | POA: Diagnosis not present

## 2020-07-09 DIAGNOSIS — Z85828 Personal history of other malignant neoplasm of skin: Secondary | ICD-10-CM | POA: Diagnosis not present

## 2020-07-09 DIAGNOSIS — L821 Other seborrheic keratosis: Secondary | ICD-10-CM | POA: Diagnosis not present

## 2020-07-09 DIAGNOSIS — L905 Scar conditions and fibrosis of skin: Secondary | ICD-10-CM | POA: Diagnosis not present

## 2020-07-09 DIAGNOSIS — L570 Actinic keratosis: Secondary | ICD-10-CM | POA: Diagnosis not present

## 2020-07-09 DIAGNOSIS — L814 Other melanin hyperpigmentation: Secondary | ICD-10-CM | POA: Diagnosis not present

## 2020-07-09 DIAGNOSIS — L578 Other skin changes due to chronic exposure to nonionizing radiation: Secondary | ICD-10-CM | POA: Diagnosis not present

## 2020-07-09 DIAGNOSIS — D1801 Hemangioma of skin and subcutaneous tissue: Secondary | ICD-10-CM | POA: Diagnosis not present

## 2020-08-14 DIAGNOSIS — R3915 Urgency of urination: Secondary | ICD-10-CM | POA: Diagnosis not present

## 2020-11-25 DIAGNOSIS — J029 Acute pharyngitis, unspecified: Secondary | ICD-10-CM | POA: Diagnosis not present

## 2020-11-25 DIAGNOSIS — Z20822 Contact with and (suspected) exposure to covid-19: Secondary | ICD-10-CM | POA: Diagnosis not present

## 2020-11-25 DIAGNOSIS — R0981 Nasal congestion: Secondary | ICD-10-CM | POA: Diagnosis not present

## 2020-11-25 DIAGNOSIS — R059 Cough, unspecified: Secondary | ICD-10-CM | POA: Diagnosis not present

## 2020-11-27 ENCOUNTER — Other Ambulatory Visit: Payer: Self-pay | Admitting: Family Medicine

## 2020-11-27 DIAGNOSIS — G4700 Insomnia, unspecified: Secondary | ICD-10-CM

## 2020-12-09 ENCOUNTER — Other Ambulatory Visit: Payer: Self-pay | Admitting: Family Medicine

## 2020-12-09 ENCOUNTER — Encounter: Payer: Self-pay | Admitting: Family Medicine

## 2020-12-09 ENCOUNTER — Other Ambulatory Visit: Payer: Self-pay

## 2020-12-09 ENCOUNTER — Ambulatory Visit (INDEPENDENT_AMBULATORY_CARE_PROVIDER_SITE_OTHER): Payer: Medicare Other | Admitting: Family Medicine

## 2020-12-09 VITALS — BP 106/72 | HR 61 | Temp 97.8°F | Ht 64.0 in | Wt 165.0 lb

## 2020-12-09 DIAGNOSIS — I10 Essential (primary) hypertension: Secondary | ICD-10-CM

## 2020-12-09 DIAGNOSIS — I2510 Atherosclerotic heart disease of native coronary artery without angina pectoris: Secondary | ICD-10-CM

## 2020-12-09 DIAGNOSIS — I714 Abdominal aortic aneurysm, without rupture, unspecified: Secondary | ICD-10-CM | POA: Insufficient documentation

## 2020-12-09 DIAGNOSIS — E1165 Type 2 diabetes mellitus with hyperglycemia: Secondary | ICD-10-CM

## 2020-12-09 DIAGNOSIS — R1319 Other dysphagia: Secondary | ICD-10-CM

## 2020-12-09 DIAGNOSIS — M546 Pain in thoracic spine: Secondary | ICD-10-CM

## 2020-12-09 LAB — COMPREHENSIVE METABOLIC PANEL
ALT: 22 U/L (ref 0–53)
AST: 18 U/L (ref 0–37)
Albumin: 4.4 g/dL (ref 3.5–5.2)
Alkaline Phosphatase: 29 U/L — ABNORMAL LOW (ref 39–117)
BUN: 15 mg/dL (ref 6–23)
CO2: 29 mEq/L (ref 19–32)
Calcium: 9.5 mg/dL (ref 8.4–10.5)
Chloride: 103 mEq/L (ref 96–112)
Creatinine, Ser: 1.11 mg/dL (ref 0.40–1.50)
GFR: 68.13 mL/min (ref >60.00)
Glucose, Bld: 161 mg/dL — ABNORMAL HIGH (ref 70–99)
Potassium: 4.6 mEq/L (ref 3.5–5.1)
Sodium: 139 mEq/L (ref 135–145)
Total Bilirubin: 0.8 mg/dL (ref 0.2–1.2)
Total Protein: 6.3 g/dL (ref 6.0–8.3)

## 2020-12-09 LAB — LIPID PANEL
Cholesterol: 99 mg/dL (ref 0–200)
HDL: 32.1 mg/dL — ABNORMAL LOW (ref >39.00)
LDL Cholesterol: 43 mg/dL (ref 0–99)
NonHDL: 66.98
Total CHOL/HDL Ratio: 3
Triglycerides: 118 mg/dL (ref 0.0–149.0)
VLDL: 23.6 mg/dL (ref 0.0–40.0)

## 2020-12-09 LAB — MICROALBUMIN / CREATININE URINE RATIO
Creatinine,U: 45.1 mg/dL
Microalb Creat Ratio: 1.6 mg/g (ref 0.0–30.0)
Microalb, Ur: <0.7 mg/dL (ref 0.0–1.9)

## 2020-12-09 LAB — HEMOGLOBIN A1C: Hgb A1c MFr Bld: 8.5 % — ABNORMAL HIGH (ref 4.6–6.5)

## 2020-12-09 MED ORDER — METFORMIN HCL ER (MOD) 500 MG PO TB24: 500.0000 mg | ORAL_TABLET | Freq: Two times a day (BID) | ORAL | 3 refills | Status: DC

## 2020-12-09 MED ORDER — ATORVASTATIN CALCIUM 80 MG PO TABS: 1.0000 | ORAL_TABLET | Freq: Every day | ORAL | 3 refills | Status: DC

## 2020-12-09 MED ORDER — AMLODIPINE BESYLATE 5 MG PO TABS: 1.0000 | ORAL_TABLET | Freq: Every day | ORAL | 3 refills | Status: DC

## 2020-12-09 NOTE — Patient Instructions (Addendum)
Give [place] 2-3 business days to get the results of your labs back.   Keep the diet clean and stay active.  Ice/cold pack over area for 10-15 min twice daily.  Heat (pad or rice pillow in microwave) over affected area, 10-15 minutes twice daily.   Send me a message in a few weeks if the back isn't getting better.  Please call Dr. Havery Moros regarding your swallowing issue.   Let us know if you need anything.   Mid-Back Strain Rehab It is normal to feel mild stretching, pulling, tightness, or discomfort as you do these exercises, but you should stop right away if you feel sudden pain or your pain gets worse.   Stretching and range of motion exercises This exercise warms up your muscles and joints and improves the movement and flexibility of your back and shoulders. This exercise also help to relieve pain. Exercise A: Chest and spine stretch    Lie down on your back on a firm surface. Roll a towel or a small blanket so it is about 4 inches (10 cm) in diameter. Put the towel lengthwise under the middle of your back so it is under your spine, but not under your shoulder blades. To increase the stretch, you may put your hands behind your head and let your elbows fall to your sides. Hold for 30 seconds. Repeat exercise 2 times. Complete this exercise 3 times per week.  Strengthening exercises These exercises build strength and endurance in your back and your shoulder blade muscles. Endurance is the ability to use your muscles for a long time, even after they get tired. Exercise B: Alternating arm and leg raises    Get on your hands and knees on a firm surface. If you are on a hard floor, you may want to use padding to cushion your knees, such as an exercise mat. Line up your arms and legs. Your hands should be below your shoulders, and your knees should be below your hips. Lift your left leg behind you. At the same time, raise your right arm and straighten it in front of you. Do not lift  your leg higher than your hip. Do not lift your arm higher than your shoulder. Keep your abdominal and back muscles tight. Keep your hips facing the ground. Do not arch your back. Keep your balance carefully, and do not hold your breath. Hold for 3 seconds. Slowly return to the starting position and repeat with your right leg and your left arm. Repeat 2 times. Complete this exercise 3 times per week. Exercise C: Straight arm rows (shoulder extension)     Stand with your feet shoulder width apart. Secure an exercise band to a stable object in front of you so the band is at or above shoulder height. Hold one end of the exercise band in each hand. Straighten your elbows and lift your hands up to shoulder height. Step back, away from the secured end of the exercise band, until the band stretches. Squeeze your shoulder blades together and pull your hands down to the sides of your thighs. Stop when your hands are straight down by your sides. Do not let your hands go behind your body. Hold for 3 seconds. Slowly return to the starting position. Repeat 2 times. Complete this exercise 3 times per week. Exercise D: Shoulder external rotation, prone Lie on your abdomen on a firm bed so your left / right forearm hangs over the edge of the bed and your upper arm  is on the bed, straight out from your body. Your elbow should be bent. Your palm should be facing your feet. If instructed, hold a 5 lb weight in your hand. Squeeze your shoulder blade toward the middle of your back. Do not let your shoulder lift toward your ear. Keep your elbow bent in an "L" shape (90 degrees) while you slowly move your forearm up toward the ceiling. Move your forearm up to the height of the bed, toward your head. Your upper arm should not move. At the top of the movement, your palm should face the floor. Hold for 3 seconds. Slowly return to the starting position and relax your muscles. Repeat 3 times. Complete this  exercise 3 times per week. Exercise E: Scapular retraction and external rotation, rowing    Sit in a stable chair without armrests, or stand. Secure an exercise band to a stable object in front of you so it is at shoulder height. Hold one end of the exercise band in each hand. Bring your arms out straight in front of you. Step back, away from the secured end of the exercise band, until the band stretches. Pull the band backward. As you do this, bend your elbows and squeeze your shoulder blades together, but avoid letting the rest of your body move. Do not let your shoulders lift up toward your ears. Stop when your elbows are at your sides or slightly behind your body. Hold for 5 seconds. Slowly straighten your arms to return to the starting position. Repeat 2 times. Complete this exercise 3 times per week. Posture and body mechanics    Body mechanics refers to the movements and positions of your body while you do your daily activities. Posture is part of body mechanics. Good posture and healthy body mechanics can help to relieve stress in your body's tissues and joints. Good posture means that your spine is in its natural S-curve position (your spine is neutral), your shoulders are pulled back slightly, and your head is not tipped forward. The following are general guidelines for applying improved posture and body mechanics to your everyday activities. Standing    When standing, keep your spine neutral and your feet about hip-width apart. Keep a slight bend in your knees. Your ears, shoulders, and hips should line up. When you do a task in which you lean forward while standing in one place for a long time, place one foot up on a stable object that is 2-4 inches (5-10 cm) high, such as a footstool. This helps keep your spine neutral. Sitting    When sitting, keep your spine neutral and keep your feet flat on the floor. Use a footrest, if necessary, and keep your thighs parallel to the floor.  Avoid rounding your shoulders, and avoid tilting your head forward. When working at a desk or a computer, keep your desk at a height where your hands are slightly lower than your elbows. Slide your chair under your desk so you are close enough to maintain good posture. When working at a computer, place your monitor at a height where you are looking straight ahead and you do not have to tilt your head forward or downward to look at the screen. Resting    When lying down and resting, avoid positions that are most painful for you. If you have pain with activities such as sitting, bending, stooping, or squatting (flexion-based activities), lie in a position in which your body does not bend very much. For example, avoid  curling up on your side with your arms and knees near your chest (fetal position). If you have pain with activities such as standing for a long time or reaching with your arms (extension-based activities), lie with your spine in a neutral position and bend your knees slightly. Try the following positions: Lying on your side with a pillow between your knees. Lying on your back with a pillow under your knees.   Lifting    When lifting objects, keep your feet at least shoulder-width apart and tighten your abdominal muscles. Bend your knees and hips and keep your spine neutral. It is important to lift using the strength of your legs, not your back. Do not lock your knees straight out. Always ask for help to lift heavy or awkward objects. Make sure you discuss any questions you have with your health care provider.

## 2020-12-09 NOTE — Progress Notes (Signed)
Subjective:   Chief Complaint  Patient presents with   Follow-up    Troy Boyer is a 70 y.o. male here for follow-up of diabetes.  Here w spouse.  Troy Boyer's self monitored glucose range is low 100's.  Patient denies hypoglycemic reactions. He checks his glucose levels 2 time(s) per week. Patient does require insulin.   Medications include: diet controlled Diet is healthy.  Exercise: lifting weights, cardio No Cp or SOB.   Hypertension Patient presents for hypertension follow up. He does not routinely monitor home blood pressures. He is compliant with medications- Norvasc 5 mg/d, Altace 10 mg/d. Patient has these side effects of medication: none  Mixed Hyperlipidemia Patient presents for Mixed hyperlipidemia follow up. Currently being treated with Zetia 10 mg/d, Tricor 48 mg/d, Lipitor 80 mg/d and compliance with treatment thus far has been good. He complains of myalgias. Diet/exercise.  The patient is not known to have coexisting coronary artery disease.  Mid back pain 2 weeks ago, he was on a boat and was jostled around more than usual. No neuro ss's. No bruising, redness, swelling. Has not tried anything so far.   Past Medical History:  Diagnosis Date   Adenomatous colon polyp    tubular   CAD (coronary artery disease)    a. Cath 06/08/01 at [facility] [place] with normal LM and LAD, 95% LCx s/p Circumflex stent 4.0 x 15 mm Penta and 85% and 80% prox RCA s/p 3.5 x 23 mm Penta b. cath 01/20/2015 95% prox LCx ISR treated with DES, 55% prox to mid RCA, 45% distal RCA, 40% midLAD, EF 55%     Diabetes mellitus without complication (HCC)    Diverticulosis    Erectile dysfunction    Gallstones    Hyperlipidemia    Hypertension      Related testing: Retinal exam: Done Pneumovax: done  Objective:  BP 106/72   Pulse 61   Temp 97.8 F (36.6 C) (Oral)   Ht 5\' 4"  (1.626 m)   Wt 165 lb (74.8 kg)   SpO2 98%   BMI 28.32 kg/m  General:  Well developed, well nourished, in no  apparent distress Skin:  Warm, no pallor or diaphoresis Head:  Normocephalic, atraumatic Eyes:  Pupils equal and round, sclera anicteric without injection  Lungs:  CTAB, no access msc use Cardio:  RRR, no bruits, no LE edema Musculoskeletal:  + TTP over the latissimus dorsi on the right and right proximal erector spinae muscle group Neuro: DTRs equal and symmetric in the upper extremities bilaterally Psych: Age appropriate judgment and insight  Assessment:   Type 2 diabetes mellitus with hyperglycemia, without long-term current use of insulin (HCC) - Plan: Microalbumin / creatinine urine ratio, Lipid panel, Hemoglobin A1c, Comprehensive metabolic panel  Essential hypertension - Plan: amLODipine (NORVASC) 5 MG tablet  Coronary artery disease involving native coronary artery of native heart without angina pectoris - Plan: atorvastatin (LIPITOR) 80 MG tablet  Esophageal dysphagia  Acute right-sided thoracic back pain  AAA (abdominal aortic aneurysm) without rupture (HCC)   Plan:   Chronic, stable.  Counseled on diet and exercise.  Diet controlled.  Check above. Chronic, stable.  Continue Norvasc 5 mg daily, ramipril 10 mg daily. Continue aspirin and Lipitor 80 mg daily.  He is also on Zetia 10 mg daily and Tricor 48 mg daily. I recommended he cancel his appointment with ENT and follow-up with his gastroenterology doctor. Stretches and exercises provided.  Ice, heat, Tylenol.  Physical therapy if no better in the  next month. F/u in 6 mo for a CPE. The patient and his spouse voiced understanding and agreement to the plan.  Greater than 40 minutes were spent with the patient discussing the above in addition to reviewing their chart information on the same day of the visit.   [place], DO 12/09/20 12:04 PM

## 2021-01-08 ENCOUNTER — Encounter: Payer: Self-pay | Admitting: Gastroenterology

## 2021-01-08 ENCOUNTER — Ambulatory Visit: Payer: Medicare Other | Admitting: Gastroenterology

## 2021-01-08 VITALS — BP 120/60 | HR 64 | Ht 64.5 in | Wt 167.2 lb

## 2021-01-08 DIAGNOSIS — Z860100 Personal history of colon polyps, unspecified: Secondary | ICD-10-CM | POA: Insufficient documentation

## 2021-01-08 DIAGNOSIS — R197 Diarrhea, unspecified: Secondary | ICD-10-CM | POA: Diagnosis not present

## 2021-01-08 DIAGNOSIS — R1310 Dysphagia, unspecified: Secondary | ICD-10-CM | POA: Insufficient documentation

## 2021-01-08 NOTE — Progress Notes (Signed)
Agree with assessment and plan as outlined.  

## 2021-01-08 NOTE — Progress Notes (Signed)
01/08/2021 Troy Boyer [id number] Jun 21, 1951   HISTORY OF PRESENT ILLNESS: This is a 70 year old male who is a patient of Dr. Doyne Keel.  He presents here today with complaints of intermittent dysphagia.  He says that this has been happening more times than not recently.  He says it tends to happen with bulkier foods such as hamburgers, roast beef sandwiches, etc.  He says that they seem to get stuck.  Says that when it happens his sinuses drain a lot and he has a lot of mucus/phlegm.  He is also having issues with some bigger pills and capsules getting caught as well.  He is not on any medications for acid reflux.  He tells me that has had issues with acid reflux in the past and took medicine previously, but he has raised the head of his bed and he is not experiencing any issues with that as of late.  He had a colonoscopy in March 2015 by Dr. Deatra Ina at which time he was found to have diverticulosis, internal hemorrhoids, and one 5 mm polyp that was removed and was a tubular adenoma on pathology.  Initially was placed for 5-year colonoscopy recall, but then that was changed to 7 years with the new guidelines.  They tell me that since having his gallbladder removed he has had issues with his bowel habits.  They describe intermittent episodes of diarrhea that have led to incontinence particularly after eating.  This has been more so of an issue after eating certain foods at restaurants, etc.  Has normal bowel movements between these episodes.   Past Medical History:  Diagnosis Date   Adenomatous colon polyp    tubular   CAD (coronary artery disease)    a. Cath 06/08/01 at [facility] with normal LM and LAD, 95% LCx s/p Circumflex stent 4.0 x 15 mm Penta and 85% and 80% prox RCA s/p 3.5 x 23 mm Penta b. cath 01/20/2015 95% prox LCx ISR treated with DES, 55% prox to mid RCA, 45% distal RCA, 40% midLAD, EF 55%     Diabetes mellitus without complication ([place])    Diverticulosis    Erectile  dysfunction    Gallstones    Hyperlipidemia    Hypertension    Past Surgical History:  Procedure Laterality Date   ANGIOPLASTY  2002   2 stents   CARDIAC CATHETERIZATION N/A 01/20/2015   Procedure: Left Heart Cath and Coronary Angiography;  Surgeon: Belva Crome, MD;  Location: [place] [facility];  Service: Cardiovascular;  Laterality: N/A;   CARDIAC CATHETERIZATION N/A 01/27/2016   Procedure: Left Heart Cath and Coronary Angiography;  Surgeon: Sherren Mocha, MD;  Location: [place] [facility];  Service: Cardiovascular;  Laterality: N/A;   CHOLECYSTECTOMY N/A 02/09/2016   Procedure: LAPAROSCOPIC CHOLECYSTECTOMY WITH INTRAOPERATIVE CHOLANGIOGRAM;  Surgeon: Erroll Luna, MD;  Location: [place];  Service: General;  Laterality: N/A;   COLONOSCOPY W/ POLYPECTOMY  08/2013   Avg risk screening, Dr Thomasenia Sales. 5 mm tubular adenoma ascending.  Mild to moderated descending and sigmoid tics.  Internal hemorrhoids   CORONARY ANGIOPLASTY WITH STENT PLACEMENT     ERCP N/A 02/13/2016   Procedure: ENDOSCOPIC RETROGRADE CHOLANGIOPANCREATOGRAPHY (ERCP);  Surgeon: Doran Stabler, MD;  Location: [place];  Service: Gastroenterology;  Laterality: N/A;   ROTATOR CUFF REPAIR Right 2009   ROTATOR CUFF REPAIR Left 2014   TRIGGER FINGER RELEASE Bilateral 2013   4 fingers, middle finger on both hands    reports that  he quit smoking about 5 years ago. His smoking use included cigarettes. He has a 44.00 pack-year smoking history. He has never used smokeless tobacco. He reports current alcohol use. He reports that he does not use drugs. family history includes CAD (age of onset: 64) in his brother; CAD (age of onset: 107) in his sister; Crohn's disease in his mother; Dementia in his mother; Diabetes in his brother and father; Heart disease (age of onset: 90) in his father; Kidney disease in his brother. No Known Allergies    Outpatient Encounter Medications as of 01/08/2021  Medication Sig   Accu-Chek FastClix  Lancets MISC Check blood sugars once or twice weekly   acetaminophen (TYLENOL) 500 MG tablet Take 1,000 mg by mouth every 6 (six) hours as needed for headache (pain).   amLODipine (NORVASC) 5 MG tablet Take 1 tablet (5 mg total) by mouth daily.   aspirin EC 81 MG tablet Take 81 mg by mouth daily.   atorvastatin (LIPITOR) 80 MG tablet Take 1 tablet (80 mg total) by mouth at bedtime.   clomiPHENE (CLOMID) 50 MG tablet Take by mouth daily.   ezetimibe (ZETIA) 10 MG tablet TAKE 1 TABLET BY MOUTH  DAILY   fenofibrate (TRICOR) 48 MG tablet TAKE 1 TABLET BY MOUTH  DAILY   Krill Oil 500 MG CAPS Take 500 mg by mouth 2 (two) times daily.   metFORMIN (GLUMETZA) 500 MG (MOD) 24 hr tablet Take 1 tablet (500 mg total) by mouth 2 (two) times daily with a meal.   metoprolol tartrate (LOPRESSOR) 25 MG tablet TAKE 1 TABLET BY MOUTH  DAILY   Multiple Vitamin (MULTIVITAMIN WITH MINERALS) TABS tablet Take 1 tablet by mouth daily.   nitroGLYCERIN (NITROSTAT) 0.4 MG SL tablet Place 1 tablet (0.4 mg total) under the tongue every 5 (five) minutes as needed for chest pain.   ramipril (ALTACE) 10 MG capsule TAKE 1 CAPSULE BY MOUTH  TWICE DAILY   tadalafil (CIALIS) 20 MG tablet Take 1 tablet (20 mg total) by mouth daily as needed for erectile dysfunction.   traZODone (DESYREL) 50 MG tablet TAKE 1 TABLET BY MOUTH AT  BEDTIME AS NEEDED FOR SLEEP   No facility-administered encounter medications on file as of 01/08/2021.     REVIEW OF SYSTEMS  : All other systems reviewed and negative except where noted in the History of Present Illness.   PHYSICAL EXAM: BP 120/60 (BP Location: Left Arm, Patient Position: Sitting, Cuff Size: Normal)   Pulse 64   Ht 5' 4.5" (1.638 m)   Wt 167 lb 4 oz (75.9 kg)   BMI 28.27 kg/m  General: Well developed white male in no acute distress Head: Normocephalic and atraumatic Eyes:  Sclerae anicteric, conjunctiva pink. Ears: Normal auditory acuity Lungs: Clear throughout to auscultation; no  W/R/R. Heart: Regular rate and rhythm; no M/R/G. Abdomen: Soft, non-distended.  BS present.  Non-tender. Rectal:  Will be done at the time of colonoscopy. Musculoskeletal: Symmetrical with no gross deformities  Skin: No lesions on visible extremities Extremities: No edema  Neurological: Alert oriented x 4, grossly non-focal Psychological:  Alert and cooperative. Normal mood and affect  ASSESSMENT AND PLAN: *Dysphagia mostly to bulky solid foods and pills, occurring over the past year: Question esophageal stricture versus dysmotility.  He reports history of acid reflux issues in the past and took medication for a while, but has not had any symptoms of that recently.  We will plan for EGD with possible dilation with Dr. Havery Moros. *Personal  history of colon polyps: Last colonoscopy March 2015 with 1 small tubular adenoma removed.  We will plan for repeat colonoscopy at this time as well. *Intermittent diarrhea: This is been an issue periodically after meals since having his gallbladder removed: Recommended Imodium as needed for now when he goes out to dinner, eats something that typically triggers him to have diarrhea, etc.  **The risks, benefits, and alternatives to EGD with possible dilation and colonoscopy were discussed with the patient and he consents to proceed.    CC:  Shelda Pal*

## 2021-01-08 NOTE — Patient Instructions (Signed)
If you are age 69 or older, your body mass index should be between 23-30. Your Body mass index is 28.27 kg/m. If this is out of the aforementioned range listed, please consider follow up with your Primary Care Provider. __________________________________________________________  The [place] [facility] providers would like to encourage you to use [facility] to communicate with providers for non-urgent requests or questions.  Due to long hold times on the telephone, sending your provider a message by [facility] may be a faster and more efficient way to get a response.  Please allow 48 business hours for a response.  Please remember that this is for non-urgent requests.   You have been scheduled for an endoscopy and colonoscopy. Please follow the written instructions given to you at your visit today. Please pick up your prep supplies at the pharmacy within the next 1-3 days. If you use inhalers (even only as needed), please bring them with you on the day of your procedure.  Use Imodium as needed for intermittent diarrhea.  Thank you for entrusting me with your care and choosing [facility].  Alonza Bogus, PA-C

## 2021-01-15 ENCOUNTER — Other Ambulatory Visit: Payer: Self-pay | Admitting: Family Medicine

## 2021-01-28 ENCOUNTER — Other Ambulatory Visit: Payer: Self-pay | Admitting: Cardiology

## 2021-01-28 DIAGNOSIS — E7800 Pure hypercholesterolemia, unspecified: Secondary | ICD-10-CM

## 2021-02-15 ENCOUNTER — Encounter: Payer: Self-pay | Admitting: Gastroenterology

## 2021-02-16 ENCOUNTER — Telehealth: Payer: Self-pay | Admitting: Gastroenterology

## 2021-02-16 ENCOUNTER — Other Ambulatory Visit: Payer: Self-pay

## 2021-02-16 ENCOUNTER — Ambulatory Visit (AMBULATORY_SURGERY_CENTER): Payer: Medicare Other | Admitting: Gastroenterology

## 2021-02-16 ENCOUNTER — Encounter: Payer: Self-pay | Admitting: Gastroenterology

## 2021-02-16 VITALS — BP 128/60 | HR 63 | Temp 98.0°F | Resp 9 | Ht 64.5 in | Wt 167.0 lb

## 2021-02-16 DIAGNOSIS — D122 Benign neoplasm of ascending colon: Secondary | ICD-10-CM | POA: Diagnosis not present

## 2021-02-16 DIAGNOSIS — K2100 Gastro-esophageal reflux disease with esophagitis, without bleeding: Secondary | ICD-10-CM | POA: Diagnosis not present

## 2021-02-16 DIAGNOSIS — Z86010 Personal history of colonic polyps: Secondary | ICD-10-CM | POA: Diagnosis not present

## 2021-02-16 DIAGNOSIS — K2289 Other specified disease of esophagus: Secondary | ICD-10-CM | POA: Diagnosis not present

## 2021-02-16 DIAGNOSIS — R197 Diarrhea, unspecified: Secondary | ICD-10-CM | POA: Diagnosis not present

## 2021-02-16 DIAGNOSIS — R1310 Dysphagia, unspecified: Secondary | ICD-10-CM | POA: Diagnosis not present

## 2021-02-16 DIAGNOSIS — I2510 Atherosclerotic heart disease of native coronary artery without angina pectoris: Secondary | ICD-10-CM | POA: Diagnosis not present

## 2021-02-16 DIAGNOSIS — K229 Disease of esophagus, unspecified: Secondary | ICD-10-CM

## 2021-02-16 DIAGNOSIS — I10 Essential (primary) hypertension: Secondary | ICD-10-CM | POA: Diagnosis not present

## 2021-02-16 DIAGNOSIS — R194 Change in bowel habit: Secondary | ICD-10-CM

## 2021-02-16 MED ORDER — SODIUM CHLORIDE 0.9 % IV SOLN: 500.0000 mL | Freq: Once | INTRAVENOUS | Status: DC

## 2021-02-16 MED ORDER — OMEPRAZOLE 20 MG PO CPDR: 20.0000 mg | DELAYED_RELEASE_CAPSULE | Freq: Every day | ORAL | 3 refills | Status: DC

## 2021-02-16 NOTE — Progress Notes (Signed)
Report to PACU, RN, vss, BBS= Clear.  

## 2021-02-16 NOTE — Op Note (Signed)
[name] Procedure Date: 02/16/2021 1:18 PM MRN: [id number] Endoscopist: Remo Lipps P. Havery Moros , MD Age: 70 Referring MD:  Date of Birth: [birth year] Gender: Male Account #: [id number] Procedure:                Colonoscopy Indications:              High risk colon cancer surveillance: Personal                            history of colonic polyps (adenoma removed in                            2015), incidentally endorses chronic loose stools                            since cholecystectomy Medicines:                Monitored Anesthesia Care Procedure:                Pre-Anesthesia Assessment:                           - Prior to the procedure, a History and Physical                            was performed, and patient medications and                            allergies were reviewed. The patient's tolerance of                            previous anesthesia was also reviewed. The risks                            and benefits of the procedure and the sedation                            options and risks were discussed with the patient.                            All questions were answered, and informed consent                            was obtained. Prior Anticoagulants: The patient has                            taken no previous anticoagulant or antiplatelet                            agents. ASA Grade Assessment: II - A patient with                            mild systemic disease. After reviewing the risks  and benefits, the patient was deemed in                            satisfactory condition to undergo the procedure.                           After obtaining informed consent, the colonoscope                            was passed under direct vision. Throughout the                            procedure, the patient's blood pressure, pulse, and                            oxygen saturations were monitored  continuously. The                            CF [id number] was introduced through the anus                            and advanced to the the terminal ileum, with                            identification of the appendiceal orifice and IC                            valve. The colonoscopy was performed without                            difficulty. The patient tolerated the procedure                            well. The quality of the bowel preparation was                            good. The terminal ileum, ileocecal valve,                            appendiceal orifice, and rectum were photographed. Scope In: 1:51:10 PM Scope Out: 2:06:22 PM Scope Withdrawal Time: 0 hours 13 minutes 11 seconds  Total Procedure Duration: 0 hours 15 minutes 12 seconds  Findings:                 The perianal and digital rectal examinations were                            normal.                           The terminal ileum appeared normal.                           A 2 to 3 mm polyp was found in the ascending colon.  The polyp was sessile. The polyp was removed with a                            cold biopsy forceps. Resection and retrieval were                            complete.                           Multiple medium-mouthed diverticula were found in                            the entire colon.                           Internal hemorrhoids were found during retroflexion                            with stigmata of prior banding (scarring noted).                           The exam was otherwise without abnormality. No                            overt inflammatory changes noted.                           Biopsies for histology were taken with a cold                            forceps from the right colon, left colon and                            transverse colon for evaluation of microscopic                            colitis. Complications:            No immediate  complications. Estimated blood loss:                            Minimal. Estimated Blood Loss:     Estimated blood loss was minimal. Impression:               - The examined portion of the ileum was normal.                           - One 2 to 3 mm polyp in the ascending colon,                            removed with a cold biopsy forceps. Resected and                            retrieved.                           -  Diverticulosis in the entire examined colon.                           - Internal hemorrhoids with stigmata of prior                            banding.                           - The examination was otherwise normal.                           - Biopsies were taken with a cold forceps from the                            right colon, left colon and transverse colon for                            evaluation of microscopic colitis. Recommendation:           - Patient has a contact number available for                            emergencies. The signs and symptoms of potential                            delayed complications were discussed with the                            patient. Return to normal activities tomorrow.                            Written discharge instructions were provided to the                            patient.                           - Resume previous diet.                           - Continue present medications.                           - Await pathology results. If pathology results                            negative, would try Colestid for post                            cholecystectomy related diarrhea Remo Lipps P. Takhia Spoon, MD 02/16/2021 2:11:59 PM This report has been signed electronically.

## 2021-02-16 NOTE — Telephone Encounter (Signed)
Patient called approximately 7pm through answering service c/o temp 100.1 and shivering that began about an hour prior.  Had EGD/colon today  - uneventful and he confirmed feeling well afterward. Procedure reports reviewed.  Denies chest pain, cough, abdominal pain or rectal bleeding.  My impression was that he most likely had a viral illness coincidentally declaring itself the day of his procedures.  Low risk endoscopic interventions, he felt well before going home, and currently no abdominal or chest pain.  Recommended tylenol, plenty of oral fluids and rest.  Instructed to call back as needed, especially if chest pain, abdominal pain or rectal bleeding occur.  - HD

## 2021-02-16 NOTE — Progress Notes (Signed)
[facility] History and Physical   Primary Care Physician:  Shelda Pal, DO   Reason for Procedure:   Dysphagia, history of colon polyps  Plan:    EGD with dilation, colonoscopy     HPI: Troy Boyer is a 70 y.o. male here for EGD for dysphagia and colonoscopy for history of colon polyps. Denies other complaints today, otherwise feeling well. He has had some loose stools since his gallbladder was removed, not taking anything for that currently. No cardiopulmonary symptoms.   Past Medical History:  Diagnosis Date   Adenomatous colon polyp    tubular   CAD (coronary artery disease)    a. Cath 06/08/01 at [facility] with normal LM and LAD, 95% LCx s/p Circumflex stent 4.0 x 15 mm Penta and 85% and 80% prox RCA s/p 3.5 x 23 mm Penta b. cath 01/20/2015 95% prox LCx ISR treated with DES, 55% prox to mid RCA, 45% distal RCA, 40% midLAD, EF 55%     Cataract    Diabetes mellitus without complication ([place])    Diverticulosis    Erectile dysfunction    Gallstones    Hyperlipidemia    Hypertension    Myocardial infarction [facility])     Past Surgical History:  Procedure Laterality Date   ANGIOPLASTY  2002   2 stents   CARDIAC CATHETERIZATION N/A 01/20/2015   Procedure: Left Heart Cath and Coronary Angiography;  Surgeon: Belva Crome, MD;  Location: [place] [facility];  Service: Cardiovascular;  Laterality: N/A;   CARDIAC CATHETERIZATION N/A 01/27/2016   Procedure: Left Heart Cath and Coronary Angiography;  Surgeon: Sherren Mocha, MD;  Location: [place] [facility];  Service: Cardiovascular;  Laterality: N/A;   CHOLECYSTECTOMY N/A 02/09/2016   Procedure: LAPAROSCOPIC CHOLECYSTECTOMY WITH INTRAOPERATIVE CHOLANGIOGRAM;  Surgeon: Erroll Luna, MD;  Location: [place];  Service: General;  Laterality: N/A;   COLONOSCOPY W/ POLYPECTOMY  08/2013   Avg risk screening, Dr Thomasenia Sales. 5 mm tubular adenoma ascending.  Mild to moderated descending and sigmoid tics.  Internal  hemorrhoids   CORONARY ANGIOPLASTY WITH STENT PLACEMENT     ERCP N/A 02/13/2016   Procedure: ENDOSCOPIC RETROGRADE CHOLANGIOPANCREATOGRAPHY (ERCP);  Surgeon: Doran Stabler, MD;  Location: [place];  Service: Gastroenterology;  Laterality: N/A;   ROTATOR CUFF REPAIR Right 2009   ROTATOR CUFF REPAIR Left 2014   TRIGGER FINGER RELEASE Bilateral 2013   4 fingers, middle finger on both hands    Prior to Admission medications   Medication Sig Start Date End Date Taking? Authorizing Provider  Accu-Chek FastClix Lancets MISC Check blood sugars once or twice weekly 01/21/20  Yes Wendling, Crosby Oyster, DO  amLODipine (NORVASC) 5 MG tablet Take 1 tablet (5 mg total) by mouth daily. 12/09/20  Yes Shelda Pal, DO  aspirin EC 81 MG tablet Take 81 mg by mouth daily.   Yes [provider]  atorvastatin (LIPITOR) 80 MG tablet Take 1 tablet (80 mg total) by mouth at bedtime. 12/09/20  Yes Shelda Pal, DO  clomiPHENE (CLOMID) 50 MG tablet Take by mouth daily.   Yes [provider]  ezetimibe (ZETIA) 10 MG tablet TAKE 1 TABLET BY MOUTH  DAILY 01/29/21  Yes Lelon Perla, MD  fenofibrate (TRICOR) 48 MG tablet TAKE 1 TABLET BY MOUTH  DAILY 01/16/21  Yes Shelda Pal, DO  Krill Oil 500 MG CAPS Take 500 mg by mouth 2 (two) times daily.   Yes [provider]  metoprolol  tartrate (LOPRESSOR) 25 MG tablet TAKE 1 TABLET BY MOUTH  DAILY 06/30/20  Yes Crenshaw, Denice Bors, MD  Multiple Vitamin (MULTIVITAMIN WITH MINERALS) TABS tablet Take 1 tablet by mouth daily.   Yes [provider]  ramipril (ALTACE) 10 MG capsule TAKE 1 CAPSULE BY MOUTH  TWICE DAILY 04/21/20  Yes Lelon Perla, MD  traZODone (DESYREL) 50 MG tablet TAKE 1 TABLET BY MOUTH AT  BEDTIME AS NEEDED FOR SLEEP 11/27/20  Yes Shelda Pal, DO  acetaminophen (TYLENOL) 500 MG tablet Take 1,000 mg by mouth every 6 (six) hours as needed for headache (pain).    [provider]  nitroGLYCERIN (NITROSTAT) 0.4 MG SL tablet Place 1 tablet (0.4 mg total) under the tongue every 5 (five) minutes as needed for chest pain. 12/10/19   Shelda Pal, DO  tadalafil (CIALIS) 20 MG tablet Take 1 tablet (20 mg total) by mouth daily as needed for erectile dysfunction. 02/16/19   Shelda Pal, DO    Current Outpatient Medications  Medication Sig Dispense Refill   Accu-Chek FastClix Lancets MISC Check blood sugars once or twice weekly 102 each 12   amLODipine (NORVASC) 5 MG tablet Take 1 tablet (5 mg total) by mouth daily. 90 tablet 3   aspirin EC 81 MG tablet Take 81 mg by mouth daily.     atorvastatin (LIPITOR) 80 MG tablet Take 1 tablet (80 mg total) by mouth at bedtime. 90 tablet 3   clomiPHENE (CLOMID) 50 MG tablet Take by mouth daily.     ezetimibe (ZETIA) 10 MG tablet TAKE 1 TABLET BY MOUTH  DAILY 90 tablet 3   fenofibrate (TRICOR) 48 MG tablet TAKE 1 TABLET BY MOUTH  DAILY 90 tablet 3   Krill Oil 500 MG CAPS Take 500 mg by mouth 2 (two) times daily.     metoprolol tartrate (LOPRESSOR) 25 MG tablet TAKE 1 TABLET BY MOUTH  DAILY 90 tablet 3   Multiple Vitamin (MULTIVITAMIN WITH MINERALS) TABS tablet Take 1 tablet by mouth daily.     ramipril (ALTACE) 10 MG capsule TAKE 1 CAPSULE BY MOUTH  TWICE DAILY 180 capsule 3   traZODone (DESYREL) 50 MG tablet TAKE 1 TABLET BY MOUTH AT  BEDTIME AS NEEDED FOR SLEEP 90 tablet 1   acetaminophen (TYLENOL) 500 MG tablet Take 1,000 mg by mouth every 6 (six) hours as needed for headache (pain).     nitroGLYCERIN (NITROSTAT) 0.4 MG SL tablet Place 1 tablet (0.4 mg total) under the tongue every 5 (five) minutes as needed for chest pain. 25 tablet 0   tadalafil (CIALIS) 20 MG tablet Take 1 tablet (20 mg total) by mouth daily as needed for erectile dysfunction. 30 tablet 2   Current Facility-Administered Medications  Medication Dose Route Frequency Provider Last Rate Last Admin   0.9 %  sodium chloride infusion  500 mL  Intravenous Once [place] Sica, Carlota Raspberry, MD        Allergies as of 02/16/2021   (No Known Allergies)    Family History  Problem Relation Age of Onset   Dementia Mother    Crohn's disease Mother    Heart disease Father 3       Valve replacement and CAD, CABG   Diabetes Father    CAD Sister 4   CAD Brother 19       Died age 24   Diabetes Brother    Kidney disease Brother    Colon cancer Neg Hx  Esophageal cancer Neg Hx    Stomach cancer Neg Hx    Rectal cancer Neg Hx     Social History   Socioeconomic History   Marital status: Significant Other    Spouse name: Not on file   Number of children: 2   Years of education: 65   Highest education level: Not on file  Occupational History   Occupation: Scientist, 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    Social Determinants of Health   Financial Resource Strain: Not on file  Food Insecurity: Not on file  Transportation Needs: Not on file  Physical Activity: Not on file  Stress: Not on file  Social Connections: Not on file  Intimate Partner Violence: Not on file    Review of Systems: All other review of systems negative except as mentioned in the HPI.  Physical Exam: Vital signs BP 126/65   Pulse 66   Temp 98 F (36.7 C)   Resp 16   Ht 5' 4.5" (1.638 m)   Wt 167 lb (75.8 kg)   SpO2 96%   BMI 28.22 kg/m   General:   Alert,  Well-developed, well-nourished, pleasant and cooperative in NAD Lungs:  Clear throughout to auscultation.   Heart:   Regular rate and rhythm;  Abdomen:  Soft, nontender and nondistended.    Neuro/Psych:  Alert and cooperative. Normal mood and affect. A and O x 3  Jolly Mango, MD [facility]

## 2021-02-16 NOTE — Progress Notes (Signed)
Called to room to assist during endoscopic procedure.  Patient ID and intended procedure confirmed with present staff. Received instructions for my participation in the procedure from the performing physician.  

## 2021-02-16 NOTE — Op Note (Addendum)
[place] Patient Name: Troy Boyer Procedure Date: 02/16/2021 1:19 PM MRN: [id number] Endoscopist: Remo Lipps P. Havery Moros , MD Age: 70 Referring MD:  Date of Birth: Sep 24, 1950 Gender: Male Account #: [id number] Procedure:                Upper GI endoscopy Indications:              Dysphagia Medicines:                Monitored Anesthesia Care Procedure:                Pre-Anesthesia Assessment:                           - Prior to the procedure, a History and Physical                            was performed, and patient medications and                            allergies were reviewed. The patient's tolerance of                            previous anesthesia was also reviewed. The risks                            and benefits of the procedure and the sedation                            options and risks were discussed with the patient.                            All questions were answered, and informed consent                            was obtained. Prior Anticoagulants: The patient has                            taken no previous anticoagulant or antiplatelet                            agents. ASA Grade Assessment: II - A patient with                            mild systemic disease. After reviewing the risks                            and benefits, the patient was deemed in                            satisfactory condition to undergo the procedure.                           After obtaining informed consent, the endoscope was  passed under direct vision. Throughout the                            procedure, the patient's blood pressure, pulse, and                            oxygen saturations were monitored continuously. The                            GIF [id number] was introduced through the                            mouth, and advanced to the second part of duodenum.                            The upper GI endoscopy was accomplished  without                            difficulty. The patient tolerated the procedure                            well. Scope In: Scope Out: Findings:                 Esophagogastric landmarks were identified: the                            Z-line was found at 36 cm, the gastroesophageal                            junction was found at 36 cm and the upper extent of                            the gastric folds was found at 37 cm from the                            incisors.                           A 1 cm hiatal hernia was present.                           LA Grade A esophagitis was found 36 cm from the                            incisors.                           One benign-appearing, intrinsic mild stenosis was                            found 36 cm from the incisors. This stenosis                            measured less than one cm (in  length). A TTS                            dilator was passed through the scope. Dilation with                            an 18-19-20 mm balloon dilator was performed to 18                            mm with appropriate mucosal wrents.                           A single white plaque was found in the lower third                            of the esophagus, 35 cm from the incisors. Biopsies                            were taken with a cold forceps for histology.                           The exam of the esophagus was otherwise normal.                           The entire examined stomach was normal.                           The duodenal bulb and second portion of the                            duodenum were normal. Small incidentally noted                            diverticulum in second portion of duodenum. Complications:            No immediate complications. Estimated blood loss:                            Minimal. Estimated Blood Loss:     Estimated blood loss was minimal. Impression:               - Esophagogastric landmarks identified.                            - 1 cm hiatal hernia.                           - LA Grade A esophagitis.                           - Benign-appearing esophageal stenosis. Dilated to                            32m with good results.                           -  A single plaque in the lower third of the                            esophagus. Biopsied.                           - Normal stomach.                           - Normal duodenal bulb and second portion of the                            duodenum. Recommendation:           - Patient has a contact number available for                            emergencies. The signs and symptoms of potential                            delayed complications were discussed with the                            patient. Return to normal activities tomorrow.                            Written discharge instructions were provided to the                            patient.                           - Post dilation diet.                           - Continue present medications.                           - Recommend omeprazole '20mg'$  / day for 6 weeks and                            then as needed                           - Await pathology results and course post dilation. Remo Lipps P. Cristan Scherzer, MD 02/16/2021 2:17:14 PM This report has been signed electronically.

## 2021-02-16 NOTE — Patient Instructions (Signed)
Handout given:   diverticulosis, polyps, hiatal hernia, dilation diet Resume previous diet Continue current medications Await pathology results  YOU HAD AN ENDOSCOPIC PROCEDURE TODAY AT [place]:   Refer to the procedure report that was given to you for any specific questions about what was found during the examination.  If the procedure report does not answer your questions, please call your gastroenterologist to clarify.  If you requested that your care partner not be given the details of your procedure findings, then the procedure report has been included in a sealed envelope for you to review at your convenience later.  YOU SHOULD EXPECT: Some feelings of bloating in the abdomen. Passage of more gas than usual.  Walking can help get rid of the air that was put into your GI tract during the procedure and reduce the bloating. If you had a lower endoscopy (such as a colonoscopy or flexible sigmoidoscopy) you may notice spotting of blood in your stool or on the toilet paper. If you underwent a bowel prep for your procedure, you may not have a normal bowel movement for a few days.  Please Note:  You might notice some irritation and congestion in your nose or some drainage.  This is from the oxygen used during your procedure.  There is no need for concern and it should clear up in a day or so.  SYMPTOMS TO REPORT IMMEDIATELY:  Following lower endoscopy (colonoscopy or flexible sigmoidoscopy):  Excessive amounts of blood in the stool  Significant tenderness or worsening of abdominal pains  Swelling of the abdomen that is new, acute  Fever of 100F or higher  Following upper endoscopy (EGD)  Vomiting of blood or coffee ground material  New chest pain or pain under the shoulder blades  Painful or persistently difficult swallowing  New shortness of breath  Fever of 100F or higher  Black, tarry-looking stools  For urgent or emergent issues, a gastroenterologist can be reached  at any hour by calling [phone]. Do not use MyChart messaging for urgent concerns.   DIET:  We do recommend a small meal at first, but then you may proceed to your regular diet.  Drink plenty of fluids but you should avoid alcoholic beverages for 24 hours.  ACTIVITY:  You should plan to take it easy for the rest of today and you should NOT DRIVE or use heavy machinery until tomorrow (because of the sedation medicines used during the test).    FOLLOW UP: Our staff will call the number listed on your records 48-72 hours following your procedure to check on you and address any questions or concerns that you may have regarding the information given to you following your procedure. If we do not reach you, we will leave a message.  We will attempt to reach you two times.  During this call, we will ask if you have developed any symptoms of COVID 19. If you develop any symptoms (ie: fever, flu-like symptoms, shortness of breath, cough etc.) before then, please call [phone].  If you test positive for Covid 19 in the 2 weeks post procedure, please call and report this information to [place].    If any biopsies were taken you will be contacted by phone or by letter within the next 1-3 weeks.  Please call us at [phone] if you have not heard about the biopsies in 3 weeks.   SIGNATURES/CONFIDENTIALITY: You and/or your care partner have signed paperwork which will be entered into your  electronic medical record.  These signatures attest to the fact that that the information above on your After Visit Summary has been reviewed and is understood.  Full responsibility of the confidentiality of this discharge information lies with you and/or your care-partner.

## 2021-02-17 NOTE — Telephone Encounter (Signed)
Thanks Mallie Mussel. [place] can you check on this patient later today and see how he is doing? Thanks

## 2021-02-17 NOTE — Telephone Encounter (Signed)
Spoke with patient, he states that he feels great. He states that he followed the recommendations and all of his symptoms have stopped. Advised patient to let us know if anything changes. Pt had no concerns at the end of the call.

## 2021-02-18 ENCOUNTER — Telehealth: Payer: Self-pay

## 2021-02-18 NOTE — Telephone Encounter (Signed)
  Follow up Call-  Call back number 02/16/2021  Post procedure Call Back phone  # [phone]  Permission to leave phone message Yes  Some recent data might be hidden     Patient questions:  Do you have a fever, pain , or abdominal swelling? No. Pain Score  0 *  Have you tolerated food without any problems? Yes.    Have you been able to return to your normal activities? Yes.    Do you have any questions about your discharge instructions: Diet   No. Medications  No. Follow up visit  No.  Do you have questions or concerns about your Care? No.  Actions: * If pain score is 4 or above: No action needed, pain <4. Have you developed a fever since your procedure? no  2.   Have you had an respiratory symptoms (SOB or cough) since your procedure? no  3.   Have you tested positive for COVID 19 since your procedure no  4.   Have you had any family members/close contacts diagnosed with the COVID 19 since your procedure?  no   If yes to any of these questions please route to Joylene John, RN and Joella Prince, RN

## 2021-02-26 ENCOUNTER — Other Ambulatory Visit: Payer: Self-pay | Admitting: Cardiology

## 2021-03-11 ENCOUNTER — Telehealth (INDEPENDENT_AMBULATORY_CARE_PROVIDER_SITE_OTHER): Payer: Medicare Other | Admitting: Family Medicine

## 2021-03-11 ENCOUNTER — Encounter: Payer: Self-pay | Admitting: Family Medicine

## 2021-03-11 DIAGNOSIS — U071 COVID-19: Secondary | ICD-10-CM | POA: Diagnosis not present

## 2021-03-11 MED ORDER — MOLNUPIRAVIR EUA 200MG CAPSULE: 4.0000 | ORAL_CAPSULE | Freq: Two times a day (BID) | ORAL | 0 refills | Status: AC

## 2021-03-11 NOTE — Progress Notes (Signed)
Chief Complaint  Patient presents with   Fever    Nasal congestion     [name] here for URI complaints. Due to COVID-19 pandemic, we are interacting via web portal for an electronic face-to-face visit. I verified patient's ID using 2 identifiers. Patient agreed to proceed with visit via this method. Patient is at home, I am at office. Patient and I are present for visit.   Duration: 2 days  Associated symptoms: Fever (99.5 F), vomiting, diarrhea, nausea, and rhinorrhea; he has had greasy foods Denies: sinus congestion, sinus pain, itchy watery eyes, ear pain, ear drainage, sore throat, wheezing, shortness of breath, myalgia, and coughing, loss of smell/taste Treatment to date: Tylenol, Robitussin Sick contacts: grandson was sick last weekend; his school is shut down tomorrow and Friday.  Quadruple vaccinated.  Tested + for covid at home today.  Past Medical History:  Diagnosis Date   Adenomatous colon polyp    tubular   CAD (coronary artery disease)    a. Cath 06/08/01 at [facility] with normal LM and LAD, 95% LCx s/p Circumflex stent 4.0 x 15 mm Penta and 85% and 80% prox RCA s/p 3.5 x 23 mm Penta b. cath 01/20/2015 95% prox LCx ISR treated with DES, 55% prox to mid RCA, 45% distal RCA, 40% midLAD, EF 55%     Cataract    Diabetes mellitus without complication (HCC)    Diverticulosis    Erectile dysfunction    Gallstones    Hyperlipidemia    Hypertension    Myocardial infarction ([place])     Objective No conversational dyspnea Age appropriate judgment and insight Nml affect and mood  COVID-19 - Plan: molnupiravir EUA (LAGEVRIO) 200 mg CAPS capsule  Given age and co-morbidities, will tx w antiviral for 5 d. Supportive care. Quarantining guidelines discussed. Continue to push fluids, practice good hand hygiene, cover mouth when coughing. F/u prn. If starting to experience irreplaceable fluid loss, shaking, or shortness of breath, seek immediate care. Total time: 16 min Pt  voiced understanding and agreement to the plan.  [place], DO 03/11/21 4:06 PM

## 2021-03-13 ENCOUNTER — Ambulatory Visit: Payer: Medicare Other | Admitting: Family Medicine

## 2021-04-17 ENCOUNTER — Ambulatory Visit (INDEPENDENT_AMBULATORY_CARE_PROVIDER_SITE_OTHER): Payer: Medicare Other | Admitting: Family Medicine

## 2021-04-17 ENCOUNTER — Encounter: Payer: Self-pay | Admitting: Family Medicine

## 2021-04-17 ENCOUNTER — Other Ambulatory Visit: Payer: Self-pay

## 2021-04-17 VITALS — BP 110/60 | HR 67 | Temp 98.5°F | Resp 18 | Ht 65.0 in | Wt 165.4 lb

## 2021-04-17 DIAGNOSIS — Z23 Encounter for immunization: Secondary | ICD-10-CM | POA: Diagnosis not present

## 2021-04-17 DIAGNOSIS — E1165 Type 2 diabetes mellitus with hyperglycemia: Secondary | ICD-10-CM

## 2021-04-17 LAB — HEMOGLOBIN A1C: Hgb A1c MFr Bld: 7.8 % — ABNORMAL HIGH (ref 4.6–6.5)

## 2021-04-17 NOTE — Progress Notes (Signed)
Subjective:   Chief Complaint  Patient presents with   3 month follow up    Concerns/ questions: pt tested positive for covid on 9/11 asks how long he needs to wait before he can have the booster. Asks if he is UTD with all other vaccinations. Flu shot today: yes     Troy Boyer is a 70 y.o. male here for follow-up of diabetes.   Troy Boyer's self monitored glucose range is 140's.  Patient denies hypoglycemic reactions. He checks his glucose levels every other day. Patient does not require insulin.   Medications include: none Diet is healthy.  Exercise: cardio, lifting weights No Cp or SOB.   Past Medical History:  Diagnosis Date   Adenomatous colon polyp    tubular   CAD (coronary artery disease)    a. Cath 06/08/01 at [facility] with normal LM and LAD, 95% LCx s/p Circumflex stent 4.0 x 15 mm Penta and 85% and 80% prox RCA s/p 3.5 x 23 mm Penta b. cath 01/20/2015 95% prox LCx ISR treated with DES, 55% prox to mid RCA, 45% distal RCA, 40% midLAD, EF 55%     Cataract    Diabetes mellitus without complication (HCC)    Diverticulosis    Erectile dysfunction    Gallstones    Hyperlipidemia    Hypertension    Myocardial infarction ([place])      Related testing: Retinal exam: Due Pneumovax: PCV20 due  Objective:  BP 110/60 (BP Location: Left Arm, Patient Position: Sitting, Cuff Size: Normal)   Pulse 67   Temp 98.5 F (36.9 C) (Oral)   Resp 18   Ht 5\' 5"  (1.651 m)   Wt 165 lb 6.4 oz (75 kg)   SpO2 98%   BMI 27.52 kg/m  General:  Well developed, well nourished, in no apparent distress Skin:  Warm, no pallor or diaphoresis Lungs:  CTAB, no access msc use Cardio:  RRR, no bruits, no LE edema Psych: Age appropriate judgment and insight  Assessment:   Type 2 diabetes mellitus with hyperglycemia, without long-term current use of insulin (HCC) - Plan: Hemoglobin A1c   Plan:   Chronic, unstable. Last A1c 8.5. Goal is <8. He is currently diet controlled, but I had rx'd him  metformin which he stopped. If he still not at goal, we will restart metformin 500 mg bid and have him ck sugars in PM. Counseled on diet and exercise. He will sched eye exam. Flu shot and PCV20 today.  F/u in 3-6 mo. The patient voiced understanding and agreement to the plan.  [place], DO 04/17/21 11:12 AM

## 2021-04-17 NOTE — Patient Instructions (Signed)
Give us 2-3 business days to get the results of your labs back.   Keep the diet clean and stay active.  Let us know if you need anything. 

## 2021-04-17 NOTE — Addendum Note (Signed)
Addended by: CREFT, Kristine Garbe L on: 04/17/2021 11:27 AM   Modules accepted: Orders

## 2021-04-30 DIAGNOSIS — R21 Rash and other nonspecific skin eruption: Secondary | ICD-10-CM | POA: Diagnosis not present

## 2021-04-30 DIAGNOSIS — L249 Irritant contact dermatitis, unspecified cause: Secondary | ICD-10-CM | POA: Diagnosis not present

## 2021-05-02 ENCOUNTER — Other Ambulatory Visit: Payer: Self-pay | Admitting: Family Medicine

## 2021-05-04 ENCOUNTER — Other Ambulatory Visit: Payer: Self-pay | Admitting: Family Medicine

## 2021-05-04 NOTE — Telephone Encounter (Signed)
Patient called office as directed and he stated the ointment the dermatologist prescribed is working so far so good and no need for an appointment at this time.  I relayed to patient to please call us if anything flares and we will be happy to see him as needed.

## 2021-05-12 ENCOUNTER — Other Ambulatory Visit: Payer: Self-pay | Admitting: Family Medicine

## 2021-05-12 DIAGNOSIS — G4700 Insomnia, unspecified: Secondary | ICD-10-CM

## 2021-05-13 NOTE — Telephone Encounter (Signed)
Last RF-- 11/27/20---#90 with 1 refill Last OV----04/17/21

## 2021-06-12 ENCOUNTER — Encounter: Payer: Medicare Other | Admitting: Family Medicine

## 2021-07-13 DIAGNOSIS — Z808 Family history of malignant neoplasm of other organs or systems: Secondary | ICD-10-CM | POA: Diagnosis not present

## 2021-07-13 DIAGNOSIS — L570 Actinic keratosis: Secondary | ICD-10-CM | POA: Diagnosis not present

## 2021-07-13 DIAGNOSIS — D1801 Hemangioma of skin and subcutaneous tissue: Secondary | ICD-10-CM | POA: Diagnosis not present

## 2021-07-13 DIAGNOSIS — D225 Melanocytic nevi of trunk: Secondary | ICD-10-CM | POA: Diagnosis not present

## 2021-07-13 DIAGNOSIS — Z85828 Personal history of other malignant neoplasm of skin: Secondary | ICD-10-CM | POA: Diagnosis not present

## 2021-07-13 DIAGNOSIS — L814 Other melanin hyperpigmentation: Secondary | ICD-10-CM | POA: Diagnosis not present

## 2021-07-13 DIAGNOSIS — L578 Other skin changes due to chronic exposure to nonionizing radiation: Secondary | ICD-10-CM | POA: Diagnosis not present

## 2021-07-14 NOTE — Progress Notes (Signed)
HPI:FU CAD.  Previously followed in [facility]. Patient had a non-ST elevation myocardial infarction in 2002. Cardiac catheterization revealed an 80% proximal right coronary artery followed by an 85%. There was a 90% circumflex. LV function normal. Patient had PCI of his circumflex and right coronary artery. Nuclear study in July of 2012 showed an ejection fraction of 76%. No ischemia.    Admitted 7/16 with a NSTEMI.  LHC demonstrated pCFX 95% ISR which was treated with a DES.  Residual CAD included mLAD 40%, pRCA stent ok with 25% ISR, mRCA 55%, dRCA 45%, RPDA 30%. EF was preserved. Repeat catheterization August 2017 secondary to chest pain revealed moderate coronary disease without significant obstruction. Medical therapy recommended. LV function normal. Head CT showed possible normal pressure hydrocephalus but neurology did not think further therapy indicated. Abd ultrasound 11/18 showed ectatic aorta (2.6 cm). FU recommended 5 years.   Since last seen, he denies dyspnea, chest pain, palpitations or syncope.  Current Outpatient Medications  Medication Sig Dispense Refill   Accu-Chek FastClix Lancets MISC Check blood sugars once or twice weekly 102 each 12   acetaminophen (TYLENOL) 500 MG tablet Take 1,000 mg by mouth every 6 (six) hours as needed for headache (pain).     amLODipine (NORVASC) 5 MG tablet Take 1 tablet (5 mg total) by mouth daily. 90 tablet 3   aspirin EC 81 MG tablet Take 81 mg by mouth daily.     atorvastatin (LIPITOR) 80 MG tablet Take 1 tablet (80 mg total) by mouth at bedtime. 90 tablet 3   clomiPHENE (CLOMID) 50 MG tablet Take by mouth daily.     ezetimibe (ZETIA) 10 MG tablet TAKE 1 TABLET BY MOUTH  DAILY 90 tablet 3   fenofibrate (TRICOR) 48 MG tablet TAKE 1 TABLET BY MOUTH  DAILY 90 tablet 3   Krill Oil 500 MG CAPS Take 500 mg by mouth 2 (two) times daily.     metoprolol tartrate (LOPRESSOR) 25 MG tablet TAKE 1 TABLET BY MOUTH  DAILY 90 tablet 3   Multiple  Vitamin (MULTIVITAMIN WITH MINERALS) TABS tablet Take 1 tablet by mouth daily.     nitroGLYCERIN (NITROSTAT) 0.4 MG SL tablet Place 1 tablet (0.4 mg total) under the tongue every 5 (five) minutes as needed for chest pain. 25 tablet 0   ramipril (ALTACE) 10 MG capsule Take 1 capsule (10 mg total) by mouth 2 (two) times daily. PATIENT NEEDS TO SCHEDULE AN APPOINTMENT TO RECEIVE FUTURE REFILLS. 180 capsule 3   traZODone (DESYREL) 50 MG tablet TAKE 1 TABLET BY MOUTH AT  BEDTIME AS NEEDED FOR SLEEP 90 tablet 3   No current facility-administered medications for this visit.     Past Medical History:  Diagnosis Date   Adenomatous colon polyp    tubular   CAD (coronary artery disease)    a. Cath 06/08/01 at [facility] with normal LM and LAD, 95% LCx s/p Circumflex stent 4.0 x 15 mm Penta and 85% and 80% prox RCA s/p 3.5 x 23 mm Penta b. cath 01/20/2015 95% prox LCx ISR treated with DES, 55% prox to mid RCA, 45% distal RCA, 40% midLAD, EF 55%     Cataract    Diabetes mellitus without complication ([place])    Diverticulosis    Erectile dysfunction    Gallstones    Hyperlipidemia    Hypertension    Myocardial infarction [facility])     Past Surgical History:  Procedure Laterality Date   ANGIOPLASTY  2002   2 stents   CARDIAC CATHETERIZATION N/A 01/20/2015   Procedure: Left Heart Cath and Coronary Angiography;  Surgeon: Belva Crome, MD;  Location: [place] CV LAB;  Service: Cardiovascular;  Laterality: N/A;   CARDIAC CATHETERIZATION N/A 01/27/2016   Procedure: Left Heart Cath and Coronary Angiography;  Surgeon: Sherren Mocha, MD;  Location: [place] CV LAB;  Service: Cardiovascular;  Laterality: N/A;   CHOLECYSTECTOMY N/A 02/09/2016   Procedure: LAPAROSCOPIC CHOLECYSTECTOMY WITH INTRAOPERATIVE CHOLANGIOGRAM;  Surgeon: Erroll Luna, MD;  Location: [place];  Service: General;  Laterality: N/A;   COLONOSCOPY W/ POLYPECTOMY  08/2013   Avg risk screening, Dr Thomasenia Sales. 5 mm tubular adenoma ascending.   Mild to moderated descending and sigmoid tics.  Internal hemorrhoids   CORONARY ANGIOPLASTY WITH STENT PLACEMENT     ERCP N/A 02/13/2016   Procedure: ENDOSCOPIC RETROGRADE CHOLANGIOPANCREATOGRAPHY (ERCP);  Surgeon: Doran Stabler, MD;  Location: [place];  Service: Gastroenterology;  Laterality: N/A;   ROTATOR CUFF REPAIR Right 2009   ROTATOR CUFF REPAIR Left 2014   TRIGGER FINGER RELEASE Bilateral 2013   4 fingers, middle finger on both hands    Social History   Socioeconomic History   Marital status: Significant Other    Spouse name: Not on file   Number of children: 2   Years of education: 17   Highest education level: Not on file  Occupational History   Occupation: Scientist, 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    Social Determinants of Health   Financial Resource Strain: Not on file  Food Insecurity: Not on file  Transportation Needs: Not on file  Physical Activity: Not on file  Stress: Not on file  Social Connections: Not on file  Intimate Partner Violence: Not on file    Family History  Problem Relation Age of Onset   Dementia Mother    Crohn's disease Mother    Heart disease Father 73       Valve replacement and CAD, CABG   Diabetes Father    CAD Sister 66   CAD Brother 65       Died age 39   Diabetes Brother    Kidney disease Brother    Colon cancer Neg Hx    Esophageal cancer Neg Hx    Stomach cancer Neg Hx    Rectal cancer Neg Hx     ROS: no fevers or chills, productive cough,  hemoptysis, dysphasia, odynophagia, melena, hematochezia, dysuria, hematuria, rash, seizure activity, orthopnea, PND, pedal edema, claudication. Remaining systems are negative.  Physical Exam: Well-developed well-nourished in no acute distress.  Skin is warm and dry.  HEENT is normal.  Neck is supple.  Chest is clear to auscultation with normal expansion.  Cardiovascular exam is regular rate and rhythm.  Abdominal exam nontender or distended. No masses palpated. Extremities show no edema. neuro grossly intact  ECG-normal sinus rhythm at a rate of 66, left anterior fascicular block, no ST changes.  Personally reviewed  A/P  1 coronary artery disease-patient doing well from a symptomatic standpoint.  Continue medical therapy with aspirin and statin.  2 history of ectatic abdominal aorta-we will plan follow-up ultrasound November 2023.  3 hypertension-patient's blood pressure is controlled.  Continue present medical regimen.    4 hyperlipidemia-continue Lipitor, Zetia and fenofibrate.   Kirk Ruths, MD

## 2021-07-21 ENCOUNTER — Ambulatory Visit: Payer: Medicare Other | Admitting: Cardiology

## 2021-07-21 ENCOUNTER — Encounter: Payer: Self-pay | Admitting: Cardiology

## 2021-07-21 ENCOUNTER — Other Ambulatory Visit: Payer: Self-pay

## 2021-07-21 VITALS — BP 136/75 | HR 66 | Ht 65.0 in | Wt 167.0 lb

## 2021-07-21 DIAGNOSIS — I2510 Atherosclerotic heart disease of native coronary artery without angina pectoris: Secondary | ICD-10-CM

## 2021-07-21 DIAGNOSIS — I10 Essential (primary) hypertension: Secondary | ICD-10-CM | POA: Diagnosis not present

## 2021-07-21 DIAGNOSIS — I714 Abdominal aortic aneurysm, without rupture, unspecified: Secondary | ICD-10-CM | POA: Diagnosis not present

## 2021-07-21 DIAGNOSIS — E7800 Pure hypercholesterolemia, unspecified: Secondary | ICD-10-CM | POA: Diagnosis not present

## 2021-07-21 NOTE — Patient Instructions (Signed)

## 2021-07-27 ENCOUNTER — Other Ambulatory Visit: Payer: Self-pay | Admitting: Cardiology

## 2021-07-30 ENCOUNTER — Telehealth: Payer: Self-pay | Admitting: Family Medicine

## 2021-07-30 DIAGNOSIS — G4700 Insomnia, unspecified: Secondary | ICD-10-CM

## 2021-07-30 MED ORDER — TRAZODONE HCL 50 MG PO TABS: 50.0000 mg | ORAL_TABLET | Freq: Every evening | ORAL | 3 refills | Status: DC | PRN

## 2021-07-30 NOTE — Telephone Encounter (Signed)
Medication:  traZODone (DESYREL) 50 MG tablet [[id number]]     Has the patient contacted their pharmacy? No. (If no, request that the patient contact the pharmacy for the refill.) (If yes, when and what did the pharmacy advise?)     Preferred Pharmacy (with phone number or street name):  [facility] ) - [place], [place]  [address] [place] [address]  Phone:  [phone]  Fax:  [phone]     Agent: Please be advised that RX refills may take up to 3 business days. We ask that you follow-up with your pharmacy.

## 2021-07-30 NOTE — Telephone Encounter (Signed)
Rx sent 

## 2021-08-04 ENCOUNTER — Telehealth: Payer: Self-pay

## 2021-08-04 DIAGNOSIS — G4700 Insomnia, unspecified: Secondary | ICD-10-CM

## 2021-08-04 MED ORDER — TRAZODONE HCL 50 MG PO TABS: 50.0000 mg | ORAL_TABLET | Freq: Every evening | ORAL | 3 refills | Status: DC | PRN

## 2021-08-04 NOTE — Telephone Encounter (Signed)
Pt requested refill on trazadone last Thursday and it was sent in to incorrect pharmacy [facility]), should have been sent in to preferred pharmacy of [facility].  Pt stated he is about out of medication and needs this sent in ASAP.

## 2021-08-13 DIAGNOSIS — R350 Frequency of micturition: Secondary | ICD-10-CM | POA: Diagnosis not present

## 2021-10-16 ENCOUNTER — Encounter: Payer: Self-pay | Admitting: Gastroenterology

## 2021-10-19 ENCOUNTER — Encounter: Payer: Self-pay | Admitting: Family Medicine

## 2021-10-19 ENCOUNTER — Ambulatory Visit (INDEPENDENT_AMBULATORY_CARE_PROVIDER_SITE_OTHER): Payer: Medicare Other | Admitting: Family Medicine

## 2021-10-19 VITALS — BP 107/62 | HR 62 | Temp 98.2°F | Ht 64.0 in | Wt 167.5 lb

## 2021-10-19 DIAGNOSIS — I10 Essential (primary) hypertension: Secondary | ICD-10-CM | POA: Diagnosis not present

## 2021-10-19 DIAGNOSIS — E782 Mixed hyperlipidemia: Secondary | ICD-10-CM | POA: Diagnosis not present

## 2021-10-19 DIAGNOSIS — Z0000 Encounter for general adult medical examination without abnormal findings: Secondary | ICD-10-CM | POA: Diagnosis not present

## 2021-10-19 DIAGNOSIS — E1165 Type 2 diabetes mellitus with hyperglycemia: Secondary | ICD-10-CM

## 2021-10-19 LAB — LIPID PANEL
Cholesterol: 105 mg/dL (ref 0–200)
HDL: 32.3 mg/dL — ABNORMAL LOW (ref >39.00)
LDL Cholesterol: 37 mg/dL (ref 0–99)
NonHDL: 72.59
Total CHOL/HDL Ratio: 3
Triglycerides: 177 mg/dL — ABNORMAL HIGH (ref 0.0–149.0)
VLDL: 35.4 mg/dL (ref 0.0–40.0)

## 2021-10-19 LAB — COMPREHENSIVE METABOLIC PANEL
ALT: 22 U/L (ref 0–53)
AST: 22 U/L (ref 0–37)
Albumin: 4.4 g/dL (ref 3.5–5.2)
Alkaline Phosphatase: 32 U/L — ABNORMAL LOW (ref 39–117)
BUN: 14 mg/dL (ref 6–23)
CO2: 28 mEq/L (ref 19–32)
Calcium: 9.1 mg/dL (ref 8.4–10.5)
Chloride: 104 mEq/L (ref 96–112)
Creatinine, Ser: 0.87 mg/dL (ref 0.40–1.50)
GFR: 88 mL/min (ref >60.00)
Glucose, Bld: 160 mg/dL — ABNORMAL HIGH (ref 70–99)
Potassium: 4.2 mEq/L (ref 3.5–5.1)
Sodium: 141 mEq/L (ref 135–145)
Total Bilirubin: 0.7 mg/dL (ref 0.2–1.2)
Total Protein: 6.4 g/dL (ref 6.0–8.3)

## 2021-10-19 LAB — CBC
HCT: 41.7 % (ref 39.0–52.0)
Hemoglobin: 13.6 g/dL (ref 13.0–17.0)
MCHC: 32.7 g/dL (ref 30.0–36.0)
MCV: 90.8 fl (ref 78.0–100.0)
Platelets: 197 10*3/uL (ref 150.0–400.0)
RBC: 4.59 Mil/uL (ref 4.22–5.81)
RDW: 14 % (ref 11.5–15.5)
WBC: 5.4 10*3/uL (ref 4.0–10.5)

## 2021-10-19 LAB — HEMOGLOBIN A1C: Hgb A1c MFr Bld: 8.4 % — ABNORMAL HIGH (ref 4.6–6.5)

## 2021-10-19 MED ORDER — TADALAFIL 5 MG PO TABS: 5.0000 mg | ORAL_TABLET | Freq: Every day | ORAL | 11 refills | Status: AC

## 2021-10-19 MED ORDER — TADALAFIL 20 MG PO TABS: 20.0000 mg | ORAL_TABLET | Freq: Every day | ORAL | 2 refills | Status: DC | PRN

## 2021-10-19 NOTE — Patient Instructions (Addendum)
Give [place] 2-3 business days to get the results of your labs back.  ? ?Keep the diet clean and stay active. ? ?It may be worthwhile to reach out to Dr. Stanford Breed regarding your passing out issue.  ? ?Reach out to Dr. Lovena Neighbours regarding your erections.  ? ?Let us know if you need anything. ?

## 2021-10-19 NOTE — Progress Notes (Signed)
Chief Complaint  ?Patient presents with  ? Annual Exam  ? ? ?Well Male ?Troy Boyer is here for a complete physical.  He is here with his wife. ?His last physical was >1 year ago.  ?Current diet: in general, a "healthy" diet.   ?Current exercise: walking, lifting wts, active at him ?Weight trend: stable ?Fatigue out of ordinary? No. ?Seat belt? Yes.   ?Advanced directive? Yes, on file ? ?Health maintenance ?Shingrix- Yes ?Colonoscopy- Yes ?Tetanus- Yes ?Hep C- Yes ?Pneumonia vaccine- Yes ?AAA screening- Yes ? ?Past Medical History:  ?Diagnosis Date  ? Adenomatous colon polyp   ? tubular  ? CAD (coronary artery disease)   ? a. Cath 06/08/01 at [facility] with normal LM and LAD, 95% LCx s/p Circumflex stent 4.0 x 15 mm Penta and 85% and 80% prox RCA s/p 3.5 x 23 mm Penta b. cath 01/20/2015 95% prox LCx ISR treated with DES, 55% prox to mid RCA, 45% distal RCA, 40% midLAD, EF 55%    ? Cataract   ? Diabetes mellitus without complication ([place])   ? Diverticulosis   ? Erectile dysfunction   ? Gallstones   ? Hyperlipidemia   ? Hypertension   ? Myocardial infarction [facility])   ?  ? ?Past Surgical History:  ?Procedure Laterality Date  ? ANGIOPLASTY  2002  ? 2 stents  ? CARDIAC CATHETERIZATION N/A 01/20/2015  ? Procedure: Left Heart Cath and Coronary Angiography;  Surgeon: Belva Crome, MD;  Location: [place] [facility];  Service: Cardiovascular;  Laterality: N/A;  ? CARDIAC CATHETERIZATION N/A 01/27/2016  ? Procedure: Left Heart Cath and Coronary Angiography;  Surgeon: Sherren Mocha, MD;  Location: [place] [facility];  Service: Cardiovascular;  Laterality: N/A;  ? CHOLECYSTECTOMY N/A 02/09/2016  ? Procedure: LAPAROSCOPIC CHOLECYSTECTOMY WITH INTRAOPERATIVE CHOLANGIOGRAM;  Surgeon: Erroll Luna, MD;  Location: [place];  Service: General;  Laterality: N/A;  ? COLONOSCOPY W/ POLYPECTOMY  08/2013  ? Avg risk screening, Dr Thomasenia Sales. 5 mm tubular adenoma ascending.  Mild to moderated descending and sigmoid tics.  Internal  hemorrhoids  ? CORONARY ANGIOPLASTY WITH STENT PLACEMENT    ? ERCP N/A 02/13/2016  ? Procedure: ENDOSCOPIC RETROGRADE CHOLANGIOPANCREATOGRAPHY (ERCP);  Surgeon: Doran Stabler, MD;  Location: [place];  Service: Gastroenterology;  Laterality: N/A;  ? ROTATOR CUFF REPAIR Right 2009  ? ROTATOR CUFF REPAIR Left 2014  ? TRIGGER FINGER RELEASE Bilateral 2013  ? 4 fingers, middle finger on both hands  ? ? ?Medications  ?Current Outpatient Medications on File Prior to Visit  ?Medication Sig Dispense Refill  ? Accu-Chek FastClix Lancets MISC Check blood sugars once or twice weekly 102 each 12  ? acetaminophen (TYLENOL) 500 MG tablet Take 1,000 mg by mouth every 6 (six) hours as needed for headache (pain).    ? amLODipine (NORVASC) 5 MG tablet Take 1 tablet (5 mg total) by mouth daily. 90 tablet 3  ? aspirin EC 81 MG tablet Take 81 mg by mouth daily.    ? atorvastatin (LIPITOR) 80 MG tablet Take 1 tablet (80 mg total) by mouth at bedtime. 90 tablet 3  ? clomiPHENE (CLOMID) 50 MG tablet Take by mouth daily.    ? ezetimibe (ZETIA) 10 MG tablet TAKE 1 TABLET BY MOUTH  DAILY 90 tablet 3  ? fenofibrate (TRICOR) 48 MG tablet TAKE 1 TABLET BY MOUTH  DAILY 90 tablet 3  ? Krill Oil 500 MG CAPS Take 500 mg by mouth 2 (two) times daily.    ?  metoprolol tartrate (LOPRESSOR) 25 MG tablet TAKE 1 TABLET BY MOUTH  DAILY 90 tablet 3  ? Multiple Vitamin (MULTIVITAMIN WITH MINERALS) TABS tablet Take 1 tablet by mouth daily.    ? nitroGLYCERIN (NITROSTAT) 0.4 MG SL tablet Place 1 tablet (0.4 mg total) under the tongue every 5 (five) minutes as needed for chest pain. 25 tablet 0  ? ramipril (ALTACE) 10 MG capsule Take 1 capsule (10 mg total) by mouth 2 (two) times daily. PATIENT NEEDS TO SCHEDULE AN APPOINTMENT TO RECEIVE FUTURE REFILLS. 180 capsule 3  ? traZODone (DESYREL) 50 MG tablet Take 1 tablet (50 mg total) by mouth at bedtime as needed. for sleep 90 tablet 3  ? ? ?Allergies ?No Known Allergies ? ?Family History ?Family History   ?Problem Relation Age of Onset  ? Dementia Mother   ? Crohn's disease Mother   ? Heart disease Father 45  ?     Valve replacement and CAD, CABG  ? Diabetes Father   ? CAD Sister 43  ? CAD Brother 55  ?     Died age 4  ? Diabetes Brother   ? Kidney disease Brother   ? Colon cancer Neg Hx   ? Esophageal cancer Neg Hx   ? Stomach cancer Neg Hx   ? Rectal cancer Neg Hx   ? ? ?Review of Systems: ?Constitutional:  no fevers ?Eye:  no recent significant change in vision ?Ears:  No changes in hearing ?Nose/Mouth/Throat:  no complaints of nasal congestion, no sore throat ?Cardiovascular: no chest pain ?Respiratory:  No shortness of breath ?Gastrointestinal:  No change in bowel habits ?GU:  No frequency ?Integumentary: Purple lesion on left thigh, otherwise no abnormal skin lesions reported ?Neurologic:  no headaches ?Endocrine:  denies unexplained weight changes ? ?Exam ?BP 107/62   Pulse 62   Temp 98.2 ?F (36.8 ?C) (Oral)   Ht '5\' 4"'$  (1.626 m)   Wt 167 lb 8 oz (76 kg)   SpO2 98%   BMI 28.75 kg/m?  ?General:  well developed, well nourished, in no apparent distress ?Skin: Superficial flap varicosity noted of the left anterior thigh without TTP or ulceration, otherwise no significant moles, warts, or growths on exposed skin ?Head:  no masses, lesions, or tenderness ?Eyes:  pupils equal and round, sclera anicteric without injection ?Ears:  canals without lesions, TMs shiny without retraction, no obvious effusion, no erythema ?Nose:  nares patent, septum midline, mucosa normal ?Throat/Pharynx:  lips and gingiva without lesion; tongue and uvula midline; non-inflamed pharynx; no exudates or postnasal drainage ?Lungs:  clear to auscultation, breath sounds equal bilaterally, no respiratory distress ?Cardio:  regular rate and rhythm, no LE edema or bruits ?Rectal: Deferred ?GI: BS+, S, NT, ND, no masses or organomegaly ?Musculoskeletal:  symmetrical muscle groups noted without atrophy or deformity ?Neuro:  gait normal but  slow; deep tendon reflexes normal and symmetric; sensation intact to pinprick on feet bilaterally ?Psych: well oriented with normal range of affect and appropriate judgment/insight ? ?Assessment and Plan ? ?Well adult exam ? ?Mixed hyperlipidemia ? ?Essential hypertension ? ?Type 2 diabetes mellitus with hyperglycemia, without long-term current use of insulin ([place]), Chronic - Plan: CBC, Comprehensive metabolic panel, Lipid panel, Hemoglobin A1c, Microalbumin / creatinine urine ratio  ? ?Well 71 y.o. male. ?Counseled on diet and exercise.  Needs to exercise more routinely. ?Other orders as above. ?Lesion of leg is a varicose vein.  No pain, reassurance given. ?Briefly mentioned an episode 2 and half months ago where  he may have lost consciousness.  He will reach out to his cardiologist regarding this. ?Follow up in 6 mo or prn.  ?The patient and his wife voiced understanding and agreement to the plan. ? ?Shelda Pal, DO ?10/19/21 ?11:38 AM ? ?

## 2021-10-20 ENCOUNTER — Other Ambulatory Visit: Payer: Self-pay | Admitting: Family Medicine

## 2021-10-20 LAB — MICROALBUMIN / CREATININE URINE RATIO
Creatinine,U: 143.2 mg/dL
Microalb Creat Ratio: 0.7 mg/g (ref 0.0–30.0)
Microalb, Ur: 1 mg/dL (ref 0.0–1.9)

## 2021-10-20 MED ORDER — DAPAGLIFLOZIN PROPANEDIOL 10 MG PO TABS: 10.0000 mg | ORAL_TABLET | Freq: Every day | ORAL | 2 refills | Status: DC

## 2021-10-20 MED ORDER — PIOGLITAZONE HCL 30 MG PO TABS: 30.0000 mg | ORAL_TABLET | Freq: Every day | ORAL | 2 refills | Status: DC

## 2021-11-02 ENCOUNTER — Other Ambulatory Visit: Payer: Self-pay | Admitting: Family Medicine

## 2021-11-06 ENCOUNTER — Encounter: Payer: Self-pay | Admitting: Gastroenterology

## 2021-11-06 ENCOUNTER — Ambulatory Visit: Payer: Medicare Other | Admitting: Gastroenterology

## 2021-11-06 VITALS — BP 116/62 | HR 65 | Ht 64.0 in | Wt 169.5 lb

## 2021-11-06 DIAGNOSIS — K219 Gastro-esophageal reflux disease without esophagitis: Secondary | ICD-10-CM

## 2021-11-06 DIAGNOSIS — R1310 Dysphagia, unspecified: Secondary | ICD-10-CM | POA: Diagnosis not present

## 2021-11-06 NOTE — Patient Instructions (Addendum)
If you are age 70 or older, your body mass index should be between 23-30. Your Body mass index is 29.09 kg/m?Marland Kitchen If this is out of the aforementioned range listed, please consider follow up with your Primary Care Provider. ? ?If you are age 15 or younger, your body mass index should be between 19-25. Your Body mass index is 29.09 kg/m?Marland Kitchen If this is out of the aformentioned range listed, please consider follow up with your Primary Care Provider.  ? ?________________________________________________________ ? ?The [facility] [facility] providers would like to encourage you to use [facility] to communicate with providers for non-urgent requests or questions.  Due to long hold times on the telephone, sending your provider a message by [facility] may be a faster and more efficient way to get a response.  Please allow 48 business hours for a response.  Please remember that this is for non-urgent requests.  ?_______________________________________________________ ? ?If symptoms return, use Pepcid or omeprazole over-the-counter as needed. ? ?If difficulty swallowing returns, please contact us. ? ?Thank you for entrusting me with your care and for choosing [employer], ?Dr. Van Horne Cellar ? ? ? ?

## 2021-11-06 NOTE — Progress Notes (Signed)
? ?HPI :  ?71 year old man here for a follow-up for reflux and bowel changes/ hemorrhoids. ?  ?Recall he had a history of reflux for a couple of years, waterbrash, pyrosis, occasional regurgitation.  Historically have symptoms have been mostly at night when he sleeps.  In the past has been on Protonix anywhere from 40 mg once daily to twice daily, Pepcid, omeprazole in the past (few years only).  He presented to the office in July of last year with dysphagia.  An EGD was performed in August of last year which showed a very mild/benign peptic stricture that was dilated with a balloon to 18 mm with good result.  He had a very focal area of mild esophagitis at the stricture, otherwise the exam was without any significant abnormality.  At the time of his endoscopy he was not taking any PPI, had recommended omeprazole 20 mg daily for 4 to 6 weeks and then stop. ? ?He states since the dilation his dysphagia has resolved.  He is eating well without any recurrent dysphagia.  Denies any complaints in that regard.  He states for several months he had persistent nighttime reflux symptoms that bothered him.  Somewhere along the way he had been off all PPI after he finished course of omeprazole and had started taking Protonix '40mg'$  x 1 hour prior to his dinner.  He states he was eating dinner at 7 PM and not going to bed till about 1130.  He slept with a few pillows under the head of the bed which he did not think helped too much.  He denies any weight gain over this time or weight loss.  He contacted me to discuss options and I recommended he come to the office.  He questioned if the Protonix interacted with his Lipitor and I did not think it did, we discussed switching him to different PPI.  He tried coming off the Protonix and states once he stopped the Protonix his reflux symptoms resolved.  For the past 3 weeks he states he is felt great without any problems.  No heartburn or reflux that bothers him currently off all  regimens.  He has taken Tums on a few occasions but that is about it. ? ? ?  ?Prior work-up: ?Colonoscopy 08/31/2013 - 84m adenoma, diverticulosis, internal hemorrhoids - repeat in 7 years recommended (08/2020) ?  ?ERCP 02/13/16 - removal of CBD stones ?  ?EGD 02/16/21: ?- A 1 cm hiatal hernia was present. ?- LA Grade A esophagitis was found 36 cm from the incisors. ?- One benign-appearing, intrinsic mild stenosis was found 36 cm from the incisors. This ?stenosis measured less than one cm (in length). A TTS dilator was passed through the scope. ?Dilation with an 18-19-20 mm balloon dilator was performed to 18 mm with appropriate ?mucosal wrents. ?- A single white plaque was found in the lower third of the esophagus, 35 cm from the ?incisors. Biopsies were taken with a cold forceps for histology. ?- The exam of the esophagus was otherwise normal. ?- The entire examined stomach was normal. ?- The duodenal bulb and second portion of the duodenum were normal. Small incidentally ?noted diverticulum in second portion of duodenum. ? ?Colonoscopy 02/16/21 ?- The perianal and digital rectal examinations were normal. ?- The terminal ileum appeared normal. ?- A 2 to 3 mm polyp was found in the ascending colon. The polyp was sessile. The polyp was ?removed with a cold biopsy forceps. Resection and retrieval were complete. ?- Multiple medium-mouthed diverticula  were found in the entire colon. ?- Internal hemorrhoids were found during retroflexion with stigmata of prior banding (scarring ?noted). ?- The exam was otherwise without abnormality. No overt inflammatory changes noted. ?- Biopsies for histology were taken with a cold forceps from the right colon, left colon and ?transverse colon for evaluation of microscopic colitis. ? ?1. Surgical [P], lower esophagus bx ?- ESOPHAGEAL SQUAMOUS MUCOSA WITH MILD VASCULAR CONGESTION, AND FOCAL SQUAMOUS BALLOONING, ?SUGGESTIVE OF REFLUX ESOPHAGITIS ?- NEGATIVE FOR INCREASED INTRAEPITHELIAL  EOSINOPHILS ?2. Surgical [P], colon nos, random sites ?- COLONIC MUCOSA WITH NO SPECIFIC HISTOPATHOLOGIC CHANGES ?- NEGATIVE FOR ACUTE INFLAMMATION, INCREASED INTRAEPITHELIAL LYMPHOCYTES OR THICKENED ?SUBEPITHELIAL COLLAGEN TABLE ?3. Surgical [P], colon, ascending polyp x1, polyp (1) ?- TUBULAR ADENOMA ?- NEGATIVE FOR HIGH-GRADE DYSPLASIA OR MALIGNANCY ? ? ?Past Medical History:  ?Diagnosis Date  ? Adenomatous colon polyp   ? tubular  ? CAD (coronary artery disease)   ? a. Cath 06/08/01 at [facility] with normal LM and LAD, 95% LCx s/p Circumflex stent 4.0 x 15 mm Penta and 85% and 80% prox RCA s/p 3.5 x 23 mm Penta b. cath 01/20/2015 95% prox LCx ISR treated with DES, 55% prox to mid RCA, 45% distal RCA, 40% midLAD, EF 55%    ? Cataract   ? Diabetes mellitus without complication ([place])   ? Diverticulosis   ? Erectile dysfunction   ? Gallstones   ? Hyperlipidemia   ? Hypertension   ? Myocardial infarction [facility])   ? ? ? ?Past Surgical History:  ?Procedure Laterality Date  ? ANGIOPLASTY  2002  ? 2 stents  ? CARDIAC CATHETERIZATION N/A 01/20/2015  ? Procedure: Left Heart Cath and Coronary Angiography;  Surgeon: Belva Crome, MD;  Location: [place] [facility];  Service: Cardiovascular;  Laterality: N/A;  ? CARDIAC CATHETERIZATION N/A 01/27/2016  ? Procedure: Left Heart Cath and Coronary Angiography;  Surgeon: Sherren Mocha, MD;  Location: [place] [facility];  Service: Cardiovascular;  Laterality: N/A;  ? CHOLECYSTECTOMY N/A 02/09/2016  ? Procedure: LAPAROSCOPIC CHOLECYSTECTOMY WITH INTRAOPERATIVE CHOLANGIOGRAM;  Surgeon: Erroll Luna, MD;  Location: [place];  Service: General;  Laterality: N/A;  ? COLONOSCOPY W/ POLYPECTOMY  08/2013  ? Avg risk screening, Dr Thomasenia Sales. 5 mm tubular adenoma ascending.  Mild to moderated descending and sigmoid tics.  Internal hemorrhoids  ? CORONARY ANGIOPLASTY WITH STENT PLACEMENT    ? ERCP N/A 02/13/2016  ? Procedure: ENDOSCOPIC RETROGRADE CHOLANGIOPANCREATOGRAPHY (ERCP);  Surgeon: Doran Stabler, MD;  Location: [place];  Service: Gastroenterology;  Laterality: N/A;  ? ROTATOR CUFF REPAIR Right 2009  ? ROTATOR CUFF REPAIR Left 2014  ? TRIGGER FINGER RELEASE Bilateral 2013  ? 4 fingers, middle finger on both hands  ? ?Family History  ?Problem Relation Age of Onset  ? Dementia Mother   ? Crohn's disease Mother   ? Heart disease Father 88  ?     Valve replacement and CAD, CABG  ? Diabetes Father   ? CAD Sister 30  ? CAD Brother 55  ?     Died age 56  ? Diabetes Brother   ? Kidney disease Brother   ? Colon cancer Neg Hx   ? Esophageal cancer Neg Hx   ? Stomach cancer Neg Hx   ? Rectal cancer Neg Hx   ? Pancreatic cancer Neg Hx   ? ?Social History  ? ?Tobacco Use  ? Smoking status: Former  ?  Packs/day: 1.00  ?  Years: 44.00  ?  Pack years: 44.00  ?  Types: Cigarettes  ?  Quit date: 01/17/2015  ?  Years since quitting: 6.8  ? Smokeless tobacco: Never  ?Vaping Use  ? Vaping Use: Never used  ?Substance Use Topics  ? Alcohol use: Yes  ?  Alcohol/week: 1.0 standard drink  ?  Types: 1 Glasses of wine per week  ?  Comment: daily  ? Drug use: No  ? ?Current Outpatient Medications  ?Medication Sig Dispense Refill  ? Accu-Chek FastClix Lancets MISC CHECK BLOOD SUGAR ONCE OR  TWICE WEEKLY 102 each 3  ? acetaminophen (TYLENOL) 500 MG tablet Take 1,000 mg by mouth every 6 (six) hours as needed for headache (pain).    ? amLODipine (NORVASC) 5 MG tablet Take 1 tablet (5 mg total) by mouth daily. 90 tablet 3  ? aspirin EC 81 MG tablet Take 81 mg by mouth daily.    ? atorvastatin (LIPITOR) 80 MG tablet Take 1 tablet (80 mg total) by mouth at bedtime. 90 tablet 3  ? clomiPHENE (CLOMID) 50 MG tablet Take by mouth daily.    ? dapagliflozin propanediol (FARXIGA) 10 MG TABS tablet Take 1 tablet (10 mg total) by mouth daily before breakfast. 30 tablet 2  ? ezetimibe (ZETIA) 10 MG tablet TAKE 1 TABLET BY MOUTH  DAILY 90 tablet 3  ? fenofibrate (TRICOR) 48 MG tablet TAKE 1 TABLET BY MOUTH  DAILY 90 tablet 3  ? Krill Oil  500 MG CAPS Take 500 mg by mouth 2 (two) times daily.    ? metoprolol tartrate (LOPRESSOR) 25 MG tablet TAKE 1 TABLET BY MOUTH  DAILY 90 tablet 3  ? Multiple Vitamin (MULTIVITAMIN WITH MINERALS) TABS tablet Take

## 2021-11-11 MED ORDER — COLESTIPOL HCL 1 G PO TABS
1.0000 g | ORAL_TABLET | Freq: Two times a day (BID) | ORAL | 0 refills | Status: DC
Start: 2021-11-11 — End: 2022-10-20

## 2021-11-20 ENCOUNTER — Other Ambulatory Visit: Payer: Self-pay | Admitting: Family Medicine

## 2021-11-20 ENCOUNTER — Encounter: Payer: Self-pay | Admitting: Family Medicine

## 2021-11-20 ENCOUNTER — Ambulatory Visit (INDEPENDENT_AMBULATORY_CARE_PROVIDER_SITE_OTHER): Payer: Medicare Other | Admitting: Family Medicine

## 2021-11-20 VITALS — BP 110/72 | HR 90 | Temp 98.6°F | Ht 64.0 in | Wt 171.4 lb

## 2021-11-20 DIAGNOSIS — E1165 Type 2 diabetes mellitus with hyperglycemia: Secondary | ICD-10-CM

## 2021-11-20 LAB — HEMOGLOBIN A1C: Hgb A1c MFr Bld: 7.8 % — ABNORMAL HIGH (ref 4.6–6.5)

## 2021-11-20 MED ORDER — METFORMIN HCL ER 500 MG PO TB24
ORAL_TABLET | ORAL | 2 refills | Status: DC
Start: 2021-11-20 — End: 2022-02-24

## 2021-11-20 MED ORDER — PIOGLITAZONE HCL 30 MG PO TABS: 30.0000 mg | ORAL_TABLET | Freq: Every day | ORAL | 2 refills | Status: DC

## 2021-11-20 NOTE — Patient Instructions (Signed)
Give [place] 2-3 business days to get the results of your labs back.   Keep the diet clean and stay active.  Keep checking your sugars unless you hear from me.   Let us know if you need anything.

## 2021-11-20 NOTE — Progress Notes (Signed)
Subjective:   Chief Complaint  Patient presents with   Follow-up    1 month    Troy Boyer is a 71 y.o. male here for follow-up of diabetes.   Kasin's self monitored glucose range is 130's.  Patient denies hypoglycemic reactions. He checks his glucose levels 3 time(s) per week. Patient does not require insulin.   Medications include: Actos 30 mg/d Diet is healthy.  Exercise: wt lifting, walking No CP or SOB.   Past Medical History:  Diagnosis Date   Adenomatous colon polyp    tubular   CAD (coronary artery disease)    a. Cath 06/08/01 at [facility] with normal LM and LAD, 95% LCx s/p Circumflex stent 4.0 x 15 mm Penta and 85% and 80% prox RCA s/p 3.5 x 23 mm Penta b. cath 01/20/2015 95% prox LCx ISR treated with DES, 55% prox to mid RCA, 45% distal RCA, 40% midLAD, EF 55%     Cataract    Diabetes mellitus without complication ([facility])    Diverticulosis    Erectile dysfunction    Gallstones    Hyperlipidemia    Hypertension    Myocardial infarction [facility])      Related testing: Retinal exam: Done Pneumovax: done  Objective:  BP 110/72   Pulse 90   Temp 98.6 F (37 C) (Oral)   Ht '5\' 4"'$  (1.626 m)   Wt 171 lb 6 oz (77.7 kg)   SpO2 95%   BMI 29.42 kg/m  General:  Well developed, well nourished, in no apparent distress Skin:  Warm, no pallor or diaphoresis Lungs:  CTAB, no access msc use Cardio:  RRR, no bruits, no LE edema Psych: Age appropriate judgment and insight  Assessment:   Type 2 diabetes mellitus with hyperglycemia, without long-term current use of insulin ([facility]) - Plan: Hemoglobin A1c   Plan:   Chronic, uncontrolled. Goal A1c for his age is <8, 1 mo ago was 8.4.  Will Ck A1c, if not controlled, will either increase Actos to 45 mg/d or consider low dose extended release metformin. Counseled on diet and exercise. F/u pending above. The patient voiced understanding and agreement to the plan.  [name], DO 11/20/21 11:45 AM

## 2021-12-07 ENCOUNTER — Other Ambulatory Visit: Payer: Self-pay | Admitting: Gastroenterology

## 2021-12-09 ENCOUNTER — Telehealth (INDEPENDENT_AMBULATORY_CARE_PROVIDER_SITE_OTHER): Payer: Medicare Other | Admitting: Family Medicine

## 2021-12-09 ENCOUNTER — Encounter: Payer: Self-pay | Admitting: Family Medicine

## 2021-12-09 VITALS — Ht 64.0 in | Wt 171.4 lb

## 2021-12-09 DIAGNOSIS — M545 Low back pain, unspecified: Secondary | ICD-10-CM | POA: Diagnosis not present

## 2021-12-09 MED ORDER — TRAMADOL HCL 50 MG PO TABS: 50.0000 mg | ORAL_TABLET | Freq: Four times a day (QID) | ORAL | 0 refills | Status: AC | PRN

## 2021-12-09 NOTE — Progress Notes (Signed)
Patient ID: Troy Boyer, male   DOB: 07/12/1950, 71 y.o.   MRN: [id number]  Virtual Visit via Video Note  I connected with Troy Boyer on 12/09/21 at  2:45 PM EDT by a video enabled telemedicine application and verified that I am speaking with the correct person using two identifiers.  Location patient: home Location provider:work or home office Persons participating in the virtual visit: patient, provider  I discussed the limitations of evaluation and management by telemedicine and the availability of in person appointments. The patient expressed understanding and agreed to proceed.   HPI: Seen via virtual visit today with onset this past Saturday after he got back from [place] of diffuse lower lumbar back pain.  No known injury.  Denies any past history of chronic back difficulties.  No prior surgeries.  No radiculitis symptoms.  Has taken high-dose ibuprofen with some relief.  Pain is much worse with change of position.  He rates pain 10 out of 10 when changing position.  No history of cancer.  No appetite or weight changes.  No fever.  He does have history of abdominal aortic aneurysm and this was 2.6 cm back in 2018 and due for follow-up later this year.  Followed by cardiology.  No abdominal pain.  He does have history of CAD but no recent chest pains.  Recently diagnosed with type 2 diabetes and recently went on metformin.   ROS: See pertinent positives and negatives per HPI.  Past Medical History:  Diagnosis Date   Adenomatous colon polyp    tubular   CAD (coronary artery disease)    a. Cath 06/08/01 at [facility] with normal LM and LAD, 95% LCx s/p Circumflex stent 4.0 x 15 mm Penta and 85% and 80% prox RCA s/p 3.5 x 23 mm Penta b. cath 01/20/2015 95% prox LCx ISR treated with DES, 55% prox to mid RCA, 45% distal RCA, 40% midLAD, EF 55%     Cataract    Diabetes mellitus without complication ([place])    Diverticulosis    Erectile dysfunction    Gallstones     Hyperlipidemia    Hypertension    Myocardial infarction [facility])     Past Surgical History:  Procedure Laterality Date   ANGIOPLASTY  2002   2 stents   CARDIAC CATHETERIZATION N/A 01/20/2015   Procedure: Left Heart Cath and Coronary Angiography;  Surgeon: Belva Crome, MD;  Location: [place] [facility];  Service: Cardiovascular;  Laterality: N/A;   CARDIAC CATHETERIZATION N/A 01/27/2016   Procedure: Left Heart Cath and Coronary Angiography;  Surgeon: Sherren Mocha, MD;  Location: [place] [facility];  Service: Cardiovascular;  Laterality: N/A;   CHOLECYSTECTOMY N/A 02/09/2016   Procedure: LAPAROSCOPIC CHOLECYSTECTOMY WITH INTRAOPERATIVE CHOLANGIOGRAM;  Surgeon: Erroll Luna, MD;  Location: [place];  Service: General;  Laterality: N/A;   COLONOSCOPY W/ POLYPECTOMY  08/2013   Avg risk screening, Dr Thomasenia Sales. 5 mm tubular adenoma ascending.  Mild to moderated descending and sigmoid tics.  Internal hemorrhoids   CORONARY ANGIOPLASTY WITH STENT PLACEMENT     ERCP N/A 02/13/2016   Procedure: ENDOSCOPIC RETROGRADE CHOLANGIOPANCREATOGRAPHY (ERCP);  Surgeon: Doran Stabler, MD;  Location: [place];  Service: Gastroenterology;  Laterality: N/A;   ROTATOR CUFF REPAIR Right 2009   ROTATOR CUFF REPAIR Left 2014   TRIGGER FINGER RELEASE Bilateral 2013   4 fingers, middle finger on both hands    Family History  Problem Relation Age of Onset   Dementia Mother  Crohn's disease Mother    Heart disease Father 29       Valve replacement and CAD, CABG   Diabetes Father    CAD Sister 66   CAD Brother 16       Died age 49   Diabetes Brother    Kidney disease Brother    Colon cancer Neg Hx    Esophageal cancer Neg Hx    Stomach cancer Neg Hx    Rectal cancer Neg Hx    Pancreatic cancer Neg Hx     SOCIAL HX: Quit smoking 2016   Current Outpatient Medications:    Accu-Chek FastClix Lancets MISC, CHECK BLOOD SUGAR ONCE OR  TWICE WEEKLY, Disp: 102 each, Rfl: 3   acetaminophen (TYLENOL) 500  MG tablet, Take 1,000 mg by mouth every 6 (six) hours as needed for headache (pain)., Disp: , Rfl:    amLODipine (NORVASC) 5 MG tablet, Take 1 tablet (5 mg total) by mouth daily., Disp: 90 tablet, Rfl: 3   aspirin EC 81 MG tablet, Take 81 mg by mouth daily., Disp: , Rfl:    atorvastatin (LIPITOR) 80 MG tablet, Take 1 tablet (80 mg total) by mouth at bedtime., Disp: 90 tablet, Rfl: 3   clomiPHENE (CLOMID) 50 MG tablet, Take by mouth daily., Disp: , Rfl:    colestipol (COLESTID) 1 g tablet, Take 1 tablet (1 g total) by mouth 2 (two) times daily., Disp: 60 tablet, Rfl: 0   ezetimibe (ZETIA) 10 MG tablet, TAKE 1 TABLET BY MOUTH  DAILY, Disp: 90 tablet, Rfl: 3   fenofibrate (TRICOR) 48 MG tablet, TAKE 1 TABLET BY MOUTH  DAILY, Disp: 90 tablet, Rfl: 3   Krill Oil 500 MG CAPS, Take 500 mg by mouth 2 (two) times daily., Disp: , Rfl:    metFORMIN (GLUCOPHAGE-XR) 500 MG 24 hr tablet, Take 1 tab daily for 1 week and then increase to 2 tabs daily., Disp: 60 tablet, Rfl: 2   metoprolol tartrate (LOPRESSOR) 25 MG tablet, TAKE 1 TABLET BY MOUTH  DAILY, Disp: 90 tablet, Rfl: 3   Multiple Vitamin (MULTIVITAMIN WITH MINERALS) TABS tablet, Take 1 tablet by mouth daily., Disp: , Rfl:    nitroGLYCERIN (NITROSTAT) 0.4 MG SL tablet, Place 1 tablet (0.4 mg total) under the tongue every 5 (five) minutes as needed for chest pain., Disp: 25 tablet, Rfl: 0   ramipril (ALTACE) 10 MG capsule, Take 1 capsule (10 mg total) by mouth 2 (two) times daily. PATIENT NEEDS TO SCHEDULE AN APPOINTMENT TO RECEIVE FUTURE REFILLS., Disp: 180 capsule, Rfl: 3   tadalafil (CIALIS) 20 MG tablet, Take 1 tablet (20 mg total) by mouth daily as needed for erectile dysfunction., Disp: 30 tablet, Rfl: 2   tadalafil (CIALIS) 5 MG tablet, Take 1 tablet (5 mg total) by mouth daily., Disp: 30 tablet, Rfl: 11   traMADol (ULTRAM) 50 MG tablet, Take 1 tablet (50 mg total) by mouth every 6 (six) hours as needed for up to 5 days., Disp: 20 tablet, Rfl: 0    traZODone (DESYREL) 50 MG tablet, Take 1 tablet (50 mg total) by mouth at bedtime as needed. for sleep, Disp: 90 tablet, Rfl: 3  EXAM:  VITALS per patient if applicable:  GENERAL: alert, oriented, appears well and in no acute distress  HEENT: atraumatic, conjunttiva clear, no obvious abnormalities on inspection of external nose and ears  NECK: normal movements of the head and neck  LUNGS: on inspection no signs of respiratory distress, breathing rate appears normal,  no obvious gross SOB, gasping or wheezing  CV: no obvious cyanosis  MS: moves all visible extremities without noticeable abnormality  PSYCH/NEURO: pleasant and cooperative, no obvious depression or anxiety, speech and thought processing grossly intact  ASSESSMENT AND PLAN:  Discussed the following assessment and plan:   4-day history of diffuse lumbar back pain after prolonged travel.  No reported injury.  Pain worse with movement and suspect musculoskeletal.  Does not have any red flags such as weight loss, fever, history of malignancy, etc.  -Avoid regular use of nonsteroidals with his past history as above -Tramadol 1 every 6 hours as needed for severe pain #20 with no refill -We will try to avoid prednisone with his diabetes history -Recommend conservative measures with heat or ice and follow-up with primary if not improving over the next couple weeks    I discussed the assessment and treatment plan with the patient. The patient was provided an opportunity to ask questions and all were answered. The patient agreed with the plan and demonstrated an understanding of the instructions.   The patient was advised to call back or seek an in-person evaluation if the symptoms worsen or if the condition fails to improve as anticipated.     Carolann Littler, MD

## 2021-12-10 ENCOUNTER — Other Ambulatory Visit: Payer: Self-pay | Admitting: Gastroenterology

## 2021-12-10 ENCOUNTER — Telehealth: Payer: Medicare Other | Admitting: Family Medicine

## 2021-12-20 ENCOUNTER — Encounter: Payer: Self-pay | Admitting: Family Medicine

## 2021-12-20 ENCOUNTER — Other Ambulatory Visit: Payer: Self-pay | Admitting: Family Medicine

## 2021-12-22 ENCOUNTER — Ambulatory Visit (INDEPENDENT_AMBULATORY_CARE_PROVIDER_SITE_OTHER): Payer: Medicare Other | Admitting: Family Medicine

## 2021-12-22 ENCOUNTER — Encounter: Payer: Self-pay | Admitting: Family Medicine

## 2021-12-22 VITALS — BP 100/51 | HR 68 | Ht 64.0 in | Wt 165.0 lb

## 2021-12-22 DIAGNOSIS — M545 Low back pain, unspecified: Secondary | ICD-10-CM | POA: Diagnosis not present

## 2021-12-22 LAB — POC URINALSYSI DIPSTICK (AUTOMATED)
Bilirubin, UA: NEGATIVE
Blood, UA: NEGATIVE
Glucose, UA: NEGATIVE
Ketones, UA: NEGATIVE
Leukocytes, UA: NEGATIVE
Nitrite, UA: NEGATIVE
Protein, UA: NEGATIVE
Spec Grav, UA: 1.02 (ref 1.010–1.025)
Urobilinogen, UA: 0.2 E.U./dL
pH, UA: 5 (ref 5.0–8.0)

## 2021-12-22 MED ORDER — MELOXICAM 15 MG PO TABS: 15.0000 mg | ORAL_TABLET | Freq: Every day | ORAL | 0 refills | Status: DC

## 2021-12-29 ENCOUNTER — Other Ambulatory Visit: Payer: Self-pay | Admitting: Cardiology

## 2021-12-29 ENCOUNTER — Other Ambulatory Visit: Payer: Self-pay | Admitting: Family Medicine

## 2021-12-29 DIAGNOSIS — I2510 Atherosclerotic heart disease of native coronary artery without angina pectoris: Secondary | ICD-10-CM

## 2021-12-29 DIAGNOSIS — E7800 Pure hypercholesterolemia, unspecified: Secondary | ICD-10-CM

## 2021-12-29 DIAGNOSIS — I10 Essential (primary) hypertension: Secondary | ICD-10-CM

## 2022-01-08 ENCOUNTER — Encounter: Payer: Self-pay | Admitting: Family Medicine

## 2022-01-19 ENCOUNTER — Encounter: Payer: Self-pay | Admitting: Family Medicine

## 2022-01-20 ENCOUNTER — Ambulatory Visit (INDEPENDENT_AMBULATORY_CARE_PROVIDER_SITE_OTHER): Payer: Medicare Other | Admitting: Medical

## 2022-01-20 VITALS — BP 129/60 | HR 65 | Resp 18 | Ht 64.0 in | Wt 168.0 lb

## 2022-01-20 DIAGNOSIS — L089 Local infection of the skin and subcutaneous tissue, unspecified: Secondary | ICD-10-CM | POA: Diagnosis not present

## 2022-01-20 MED ORDER — MUPIROCIN 2 % EX OINT: TOPICAL_OINTMENT | CUTANEOUS | 0 refills | Status: DC

## 2022-01-20 MED ORDER — DOXYCYCLINE HYCLATE 100 MG PO TABS: 100.0000 mg | ORAL_TABLET | Freq: Two times a day (BID) | ORAL | 0 refills | Status: DC

## 2022-01-20 NOTE — Patient Instructions (Signed)
Nose skin infection with intranasal infection. Rx doxycycline oral antibiotic and mupirocin.  Rx advisement given.   Counseled on likely bacterial source.  If are worsens or changes let us know.   Follow up in 10 days or sooner if needed.

## 2022-01-20 NOTE — Progress Notes (Signed)
   Subjective:    Patient ID: Troy Boyer, male    DOB: March 11, 1951, 71 y.o.   MRN: [id number]  HPI Pt in with rt side nostril tenderness for about 2 weeks. He states  he used abbreva for on inside and outside of the nose. He states 3 days ago area got worse. He states it never really helped. Shows me picture of his nose when nose first got irritated. Did not see any skin lesion that appeared viral like.     Review of Systems  Constitutional:  Negative for chills, fatigue and fever.  HENT:         See hpi.  Respiratory:  Negative for cough, chest tightness and shortness of breath.   Cardiovascular:  Negative for chest pain and palpitations.  Neurological:  Negative for dizziness and headaches.  Hematological:  Negative for adenopathy. Does not bruise/bleed easily.   See hpi.      Objective:   Physical Exam  General- No acute distress. Pleasant patient. Neck- Full range of motion, no jvd Lungs- Clear, even and unlabored. Heart- regular rate and rhythm. Neurologic- CNII- XII grossly intact.  Heent- rt side of nose mid portion tender to palpation tip of nose and just beneath faint pinkish appearance.      Assessment & Plan:   Patient Instructions  Nose skin infection with intranasal infection. Rx doxycycline oral antibiotic and mupirocin.  Rx advisement given.   Counseled on likely bacterial source.  If are worsens or changes let us know.   Follow up in 10 days or sooner if needed.   Mackie Pai, PA-C

## 2022-02-03 ENCOUNTER — Ambulatory Visit: Payer: Medicare Other | Admitting: Family Medicine

## 2022-02-15 DIAGNOSIS — R350 Frequency of micturition: Secondary | ICD-10-CM | POA: Diagnosis not present

## 2022-02-24 ENCOUNTER — Encounter: Payer: Self-pay | Admitting: Family Medicine

## 2022-02-24 ENCOUNTER — Other Ambulatory Visit: Payer: Self-pay | Admitting: Family Medicine

## 2022-03-04 ENCOUNTER — Encounter: Payer: Self-pay | Admitting: Family Medicine

## 2022-03-17 ENCOUNTER — Ambulatory Visit (INDEPENDENT_AMBULATORY_CARE_PROVIDER_SITE_OTHER): Payer: Medicare Other | Admitting: *Deleted

## 2022-03-17 DIAGNOSIS — Z0000 Encounter for general adult medical examination without abnormal findings: Secondary | ICD-10-CM | POA: Diagnosis not present

## 2022-03-17 NOTE — Progress Notes (Signed)
Subjective:   Troy Boyer is a 71 y.o. male who presents for Medicare Annual/Subsequent preventive examination.  I connected with  [name] on 03/17/22 by an audio enabled telemedicine application and verified that I am speaking with the correct person using two identifiers.  Patient Location: Home  Provider Location: Office/Clinic  I discussed the limitations of evaluation and management by telemedicine. The patient expressed understanding and agreed to proceed.   Review of Systems    Defer to PCP Cardiac Risk Factors include: advanced age (>38mn, >>44women);diabetes mellitus;dyslipidemia;hypertension;male gender     Objective:    There were no vitals filed for this visit. There is no height or weight on file to calculate BMI.     03/17/2022    1:14 PM 02/13/2016   12:35 PM 02/12/2016   12:46 PM 02/07/2016    3:09 PM 02/07/2016    9:54 AM 01/27/2016    2:00 AM 01/26/2016   10:20 PM  Advanced Directives  Does Patient Have a Medical Advance Directive? Yes Yes Yes Yes Yes Yes Yes  Type of AParamed[place]of [place] will Living will Living will Living will [place]Living will [place]Living will [place]Living will  Does patient want to make changes to medical advance directive? No - Patient declined   No - Patient declined     Copy of HHawthornein Chart? No - copy requested No - copy requested No - copy requested No - copy requested       Current Medications (verified) Outpatient Encounter Medications as of 03/17/2022  Medication Sig   Accu-Chek FastClix Lancets MISC CHECK BLOOD SUGAR ONCE OR  TWICE WEEKLY   acetaminophen (TYLENOL) 500 MG tablet Take 1,000 mg by mouth every 6 (six) hours as needed for headache (pain).   amLODipine (NORVASC) 5 MG tablet TAKE 1 TABLET BY MOUTH  DAILY   aspirin EC 81 MG tablet Take 81 mg by mouth daily.   atorvastatin (LIPITOR) 80  MG tablet TAKE 1 TABLET BY MOUTH AT  BEDTIME   clomiPHENE (CLOMID) 50 MG tablet Take by mouth daily.   colestipol (COLESTID) 1 g tablet Take 1 tablet (1 g total) by mouth 2 (two) times daily.   ezetimibe (ZETIA) 10 MG tablet TAKE 1 TABLET BY MOUTH  DAILY   fenofibrate (TRICOR) 48 MG tablet TAKE 1 TABLET BY MOUTH  DAILY   Krill Oil 500 MG CAPS Take 500 mg by mouth 2 (two) times daily.   metFORMIN (GLUCOPHAGE-XR) 500 MG 24 hr tablet TAKE 1 TABLET BY MOUTH ONCE DAILY FOR 7 DAYS AND  THEN  INCREASE  TO  2  TABLETS  DAILY   metoprolol tartrate (LOPRESSOR) 25 MG tablet TAKE 1 TABLET BY MOUTH  DAILY   Multiple Vitamin (MULTIVITAMIN WITH MINERALS) TABS tablet Take 1 tablet by mouth daily.   mupirocin ointment (BACTROBAN) 2 % Apply intranasal twice daily   nitroGLYCERIN (NITROSTAT) 0.4 MG SL tablet Place 1 tablet (0.4 mg total) under the tongue every 5 (five) minutes as needed for chest pain.   ramipril (ALTACE) 10 MG capsule TAKE 1 CAPSULE BY MOUTH  TWICE DAILY   tadalafil (CIALIS) 20 MG tablet Take 1 tablet (20 mg total) by mouth daily as needed for erectile dysfunction.   tadalafil (CIALIS) 5 MG tablet Take 1 tablet (5 mg total) by mouth daily.   traZODone (DESYREL) 50 MG tablet Take 1 tablet (50 mg total) by mouth at  bedtime as needed. for sleep   [DISCONTINUED] doxycycline (VIBRA-TABS) 100 MG tablet Take 1 tablet (100 mg total) by mouth 2 (two) times daily.   No facility-administered encounter medications on file as of 03/17/2022.    Allergies (verified) Patient has no known allergies.   History: Past Medical History:  Diagnosis Date   Adenomatous colon polyp    tubular   CAD (coronary artery disease)    a. Cath 06/08/01 at [facility] with normal LM and LAD, 95% LCx s/p Circumflex stent 4.0 x 15 mm Penta and 85% and 80% prox RCA s/p 3.5 x 23 mm Penta b. cath 01/20/2015 95% prox LCx ISR treated with DES, 55% prox to mid RCA, 45% distal RCA, 40% midLAD, EF 55%     Cataract    Diabetes mellitus  without complication ([place])    Diverticulosis    Erectile dysfunction    Gallstones    Hyperlipidemia    Hypertension    Myocardial infarction [facility])    Past Surgical History:  Procedure Laterality Date   ANGIOPLASTY  2002   2 stents   CARDIAC CATHETERIZATION N/A 01/20/2015   Procedure: Left Heart Cath and Coronary Angiography;  Surgeon: Belva Crome, MD;  Location: [place] [facility];  Service: Cardiovascular;  Laterality: N/A;   CARDIAC CATHETERIZATION N/A 01/27/2016   Procedure: Left Heart Cath and Coronary Angiography;  Surgeon: Sherren Mocha, MD;  Location: [place] [facility];  Service: Cardiovascular;  Laterality: N/A;   CHOLECYSTECTOMY N/A 02/09/2016   Procedure: LAPAROSCOPIC CHOLECYSTECTOMY WITH INTRAOPERATIVE CHOLANGIOGRAM;  Surgeon: Erroll Luna, MD;  Location: [place];  Service: General;  Laterality: N/A;   COLONOSCOPY W/ POLYPECTOMY  08/2013   Avg risk screening, Dr Thomasenia Sales. 5 mm tubular adenoma ascending.  Mild to moderated descending and sigmoid tics.  Internal hemorrhoids   CORONARY ANGIOPLASTY WITH STENT PLACEMENT     ERCP N/A 02/13/2016   Procedure: ENDOSCOPIC RETROGRADE CHOLANGIOPANCREATOGRAPHY (ERCP);  Surgeon: Doran Stabler, MD;  Location: [place];  Service: Gastroenterology;  Laterality: N/A;   ROTATOR CUFF REPAIR Right 2009   ROTATOR CUFF REPAIR Left 2014   TRIGGER FINGER RELEASE Bilateral 2013   4 fingers, middle finger on both hands   Family History  Problem Relation Age of Onset   Dementia Mother    Crohn's disease Mother    Heart disease Father 45       Valve replacement and CAD, CABG   Diabetes Father    CAD Sister 41   CAD Brother 31       Died age 45   Diabetes Brother    Kidney disease Brother    Colon cancer Neg Hx    Esophageal cancer Neg Hx    Stomach cancer Neg Hx    Rectal cancer Neg Hx    Pancreatic cancer Neg Hx    Social History   Socioeconomic History   Marital status: Significant Other    Spouse name: Not on file    Number of children: 2   Years of education: 18   Highest education level: Not on file  Occupational History   Occupation: Scientist, 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    Social Determinants of Health   Financial Resource Strain: Low Risk  (03/17/2022)   Overall Financial Resource Strain (CARDIA)    Difficulty of Paying Living Expenses: Not very hard  Food Insecurity: No Food Insecurity (03/17/2022)   Hunger Vital Sign    Worried About Running Out of Food in the Last Year: Never true    Ran Out of Food in the Last Year: Never true  Transportation Needs: No Transportation Needs (03/17/2022)   PRAPARE - Hydrologist (Medical): No    Lack of Transportation (Non-Medical): No  Physical Activity: Sufficiently Active (03/17/2022)   Exercise Vital Sign    Days of Exercise per Week: 2 days    Minutes of Exercise per Session: 100 min  Stress: No Stress Concern Present (03/17/2022)   [place]    Feeling of Stress : Not at all  Social Connections: [place] (03/17/2022)   Social Connection and Isolation Panel [NHANES]    Frequency of Communication with Friends and Family: More than three times a week    Frequency of Social Gatherings with Friends and Family: More than three times a week    Attends Religious Services: 1 to 4 times per  year    Active Member of [employer] or Organizations: Yes    Attends Music therapist: More than 4 times per year    Marital Status: Married    Tobacco Counseling Counseling given: Not Answered   Clinical Intake:  Pre-visit preparation completed: Yes  Pain : No/denies pain     Diabetes: Yes CBG done?: No Did pt. bring in CBG monitor from home?: No (audio visit)  How often do you need to have someone help you when you read instructions, pamphlets, or other written materials from your doctor or pharmacy?: 1 - Never  Diabetic? Yes Nutrition Risk Assessment:  Has the patient had any N/V/D within the last 2 months?  No  Does the patient have any non-healing wounds?  No  Has the patient had any unintentional weight loss or weight gain?  No   Diabetes:  Is the patient diabetic?  Yes  If diabetic, was a CBG obtained today?  No  Did the patient bring in their glucometer from home?   Audio visit How often do you monitor your CBG's? 2-3 times a week.   Financial Strains and Diabetes Management:  Are you having any financial strains with the device, your supplies or your medication? No .  Does the patient want to be seen by Chronic Care Management for management of their diabetes?  No  Would the patient like to be referred to a Nutritionist or for Diabetic Management?  No   Diabetic Exams:  Diabetic Eye Exam: Completed 06/29/21 Diabetic Foot Exam: Completed 10/19/21    Interpreter Needed?: No  Information entered by :: Beatris Ship, CMA   Activities of Daily Living    03/17/2022    1:15 PM  In your present state of health, do you have any difficulty performing the  following activities:  Hearing? 0  Vision? 0  Difficulty concentrating or making decisions? 0  Walking or climbing stairs? 0  Dressing or bathing? 0  Doing errands, shopping? 0  Preparing Food and eating ? N  Using the Toilet? N  In the past six months, have you accidently leaked urine? Y  Comment  sees urology  Do you have problems with loss of bowel control? N  Managing your Medications? N  Managing your Finances? N  Housekeeping or managing your Housekeeping? N    Patient Care Team: Shelda Pal, DO as PCP - General (Family Medicine) Stanford Breed Denice Bors, MD as PCP - Cardiology (Cardiology)  Indicate any recent Medical Services you may have received from other than [facility] providers in the past year (date may be approximate).     Assessment:   This is a routine wellness examination for [place].  Hearing/Vision screen No results found.  Dietary issues and exercise activities discussed: Current Exercise Habits: Home exercise routine, Type of exercise: strength training/weights;treadmill, Time (Minutes): > 60, Frequency (Times/Week): 3, Weekly Exercise (Minutes/Week): 0, Intensity: Moderate, Exercise limited by: None identified   Goals Addressed   None    Depression Screen    03/17/2022    1:15 PM 10/19/2021   10:45 AM 12/09/2020   11:29 AM 06/10/2020   10:53 AM  PHQ 2/9 Scores  PHQ - 2 Score 0 0 0 0    Fall Risk    03/17/2022    1:14 PM 10/19/2021   10:45 AM 12/09/2020   11:29 AM  [place] in the past year? 0 1 0  Number falls in past yr: 0 0 0  Injury with Fall? 0 1 0  Comment  knot on leg and head   Risk for fall due to : No Fall Risks No Fall Risks No Fall Risks  Follow up Falls evaluation completed Falls evaluation completed Falls evaluation completed    [place]:  Any stairs in or around the home? Yes  If so, are there any without handrails? No  Home free of loose throw rugs in walkways, pet beds, electrical cords, etc? Yes  Adequate lighting in your home to reduce risk of falls? Yes   ASSISTIVE DEVICES UTILIZED TO PREVENT FALLS:  Life alert? No  Use of a cane, walker or w/c? No  Grab bars in the bathroom? No  Shower chair or bench in shower? No  Elevated toilet seat or a handicapped toilet? No    TIMED UP AND GO:  Was the test performed? No , audio visit   Cognitive Function:        03/17/2022    1:23 PM  6CIT Screen  What Year? 0 points  What month? 0 points  What time? 0 points  Count back from 20 0 points  Months in reverse 0 points  Repeat phrase 0 points  Total Score 0 points    Immunizations Immunization History  Administered Date(s) Administered   Fluad Quad(high Dose 65+) 04/13/2019, 04/17/2021   Influenza, High Dose Seasonal PF 07/14/2017, 05/11/2018, 04/01/2020   Influenza-Unspecified 07/14/2017, 03/06/2022   PFIZER(Purple Top)SARS-COV-2 Vaccination 07/23/2019, 08/13/2019, 03/31/2020, 10/09/2020   PNEUMOCOCCAL CONJUGATE-20 04/17/2021   Pfizer Covid-19 Vaccine Bivalent Booster 44yr & up 06/16/2021   Pneumococcal Conjugate-13 04/27/2017   Pneumococcal Polysaccharide-23 04/22/2016   Tdap 04/24/2015   Zoster Recombinat (Shingrix) 11/01/2017, 04/27/2018    TDAP status: Up to date  Flu vaccine status: Up to date  Pneumococcal vaccine status: Up to date  Covid-19 vaccine status: Information provided on how to obtain vaccines.   Qualifies for Shingles Vaccine? Yes   Zostavax completed No   Shingrix Completed?: Yes  Screening Tests Health Maintenance  Topic Date Due   COVID-19 Vaccine (6 - Pfizer series) 10/15/2021   HEMOGLOBIN A1C  05/23/2022   OPHTHALMOLOGY EXAM  06/29/2022   Diabetic kidney evaluation - GFR measurement  10/20/2022   Diabetic kidney evaluation - Urine ACR  10/20/2022   FOOT EXAM  10/20/2022   TETANUS/TDAP  04/23/2025   COLONOSCOPY (Pts 45-94yr Insurance coverage will need to be confirmed)  02/17/2028   Pneumonia Vaccine 70 Years old  Completed   INFLUENZA VACCINE  Completed   Hepatitis C Screening  Completed   Zoster Vaccines- Shingrix  Completed   HPV VACCINES  Aged Out    Health Maintenance  Health Maintenance Due  Topic Date Due   COVID-19 Vaccine (6 - Pfizer series) 10/15/2021    Colorectal cancer  screening: Type of screening: Colonoscopy. Completed 02/16/21. Repeat every 7 years  Lung Cancer Screening: (Low Dose CT Chest recommended if Age 70-80years, 30 pack-year currently smoking OR have quit w/in 15years.) does not qualify.   Lung Cancer Screening Referral: N/a  Additional Screening:  Hepatitis C Screening: does qualify; Completed 10/23/15  Vision Screening: Recommended annual ophthalmology exams for early detection of glaucoma and other disorders of the eye. Is the patient up to date with their annual eye exam?  Yes  Who is the provider or what is the name of the office in which the patient attends annual eye exams? Dr. LRolanda JayIf pt is not established with a provider, would they like to be referred to a provider to establish care? No .   Dental Screening: Recommended annual dental exams for proper oral hygiene  Community Resource Referral / Chronic Care Management: CRR required this visit?  No   CCM required this visit?  No      Plan:     I have personally reviewed and noted the following in the patient's chart:   Medical and social history Use of alcohol, tobacco or illicit drugs  Current medications and supplements including opioid prescriptions. Patient is not currently taking opioid prescriptions. Functional ability and status Nutritional status Physical activity Advanced directives List of other physicians Hospitalizations, surgeries, and ER visits in previous 12 months Vitals Screenings to include cognitive, depression, and falls Referrals and appointments  In addition, I have reviewed and discussed with patient certain preventive protocols, quality metrics, and best practice recommendations. A written personalized care plan for preventive services as well as general preventive health recommendations were provided to patient.   Due to this being a telephonic visit, the after visit summary with patients personalized plan was offered to patient via mail or  my-chart. Patient would like to access on my-chart.  BBeatris Ship [place]  03/17/2022   Nurse Notes: None

## 2022-03-17 NOTE — Patient Instructions (Signed)
Troy Boyer , Thank you for taking time to come for your Medicare Wellness Visit. I appreciate your ongoing commitment to your health goals. Please review the following plan we discussed and let me know if I can assist you in the future.   These are the goals we discussed:  Goals   None     This is a list of the screening recommended for you and due dates:  Health Maintenance  Topic Date Due   COVID-19 Vaccine (6 - Pfizer series) 10/15/2021   Hemoglobin A1C  05/23/2022   Eye exam for diabetics  06/29/2022   Yearly kidney function blood test for diabetes  10/20/2022   Yearly kidney health urinalysis for diabetes  10/20/2022   Complete foot exam   10/20/2022   Tetanus Vaccine  04/23/2025   Colon Cancer Screening  02/17/2028   Pneumonia Vaccine  Completed   Flu Shot  Completed   Hepatitis C Screening: USPSTF Recommendation to screen - Ages 18-79 yo.  Completed   Zoster (Shingles) Vaccine  Completed   HPV Vaccine  Aged Out      Next appointment: Follow up in one year for your annual wellness visit.   Preventive Care 70 Years and Older, Male Preventive care refers to lifestyle choices and visits with your health care provider that can promote health and wellness. What does preventive care include? A yearly physical exam. This is also called an annual well check. Dental exams once or twice a year. Routine eye exams. Ask your health care provider how often you should have your eyes checked. Personal lifestyle choices, including: Daily care of your teeth and gums. Regular physical activity. Eating a healthy diet. Avoiding tobacco and drug use. Limiting alcohol use. Practicing safe sex. Taking low doses of aspirin every day. Taking vitamin and mineral supplements as recommended by your health care provider. What happens during an annual well check? The services and screenings done by your health care provider during your annual well check will depend on your age, overall  health, lifestyle risk factors, and family history of disease. Counseling  Your health care provider may ask you questions about your: Alcohol use. Tobacco use. Drug use. Emotional well-being. Home and relationship well-being. Sexual activity. Eating habits. History of falls. Memory and ability to understand (cognition). Work and work Statistician. Screening  You may have the following tests or measurements: Height, weight, and BMI. Blood pressure. Lipid and cholesterol levels. These may be checked every 5 years, or more frequently if you are over 1 years old. Skin check. Lung cancer screening. You may have this screening every year starting at age 70 if you have a 30-pack-year history of smoking and currently smoke or have quit within the past 15 years. Fecal occult blood test (FOBT) of the stool. You may have this test every year starting at age 65. Flexible sigmoidoscopy or colonoscopy. You may have a sigmoidoscopy every 5 years or a colonoscopy every 10 years starting at age 70. Prostate cancer screening. Recommendations will vary depending on your family history and other risks. Hepatitis C blood test. Hepatitis B blood test. Sexually transmitted disease (STD) testing. Diabetes screening. This is done by checking your blood sugar (glucose) after you have not eaten for a while (fasting). You may have this done every 1-3 years. Abdominal aortic aneurysm (AAA) screening. You may need this if you are a current or former smoker. Osteoporosis. You may be screened starting at age 70 if you are at high risk. Talk with  your health care provider about your test results, treatment options, and if necessary, the need for more tests. Vaccines  Your health care provider may recommend certain vaccines, such as: Influenza vaccine. This is recommended every year. Tetanus, diphtheria, and acellular pertussis (Tdap, Td) vaccine. You may need a Td booster every 10 years. Zoster vaccine. You may  need this after age 70. Pneumococcal 13-valent conjugate (PCV13) vaccine. One dose is recommended after age 70. Pneumococcal polysaccharide (PPSV23) vaccine. One dose is recommended after age 70. Talk to your health care provider about which screenings and vaccines you need and how often you need them. This information is not intended to replace advice given to you by your health care provider. Make sure you discuss any questions you have with your health care provider. Document Released: 07/11/2015 Document Revised: 03/03/2016 Document Reviewed: 04/15/2015 Elsevier Interactive Patient Education  2017 [place] Prevention in the Home Falls can cause injuries. They can happen to people of all ages. There are many things you can do to make your home safe and to help prevent falls. What can I do on the outside of my home? Regularly fix the edges of walkways and driveways and fix any cracks. Remove anything that might make you trip as you walk through a door, such as a raised step or threshold. Trim any bushes or trees on the path to your home. Use bright outdoor lighting. Clear any walking paths of anything that might make someone trip, such as rocks or tools. Regularly check to see if handrails are loose or broken. Make sure that both sides of any steps have handrails. Any raised decks and porches should have guardrails on the edges. Have any leaves, snow, or ice cleared regularly. Use sand or salt on walking paths during winter. Clean up any spills in your garage right away. This includes oil or grease spills. What can I do in the bathroom? Use night lights. Install grab bars by the toilet and in the tub and shower. Do not use towel bars as grab bars. Use non-skid mats or decals in the tub or shower. If you need to sit down in the shower, use a plastic, non-slip stool. Keep the floor dry. Clean up any water that spills on the floor as soon as it happens. Remove soap buildup in  the tub or shower regularly. Attach bath mats securely with double-sided non-slip rug tape. Do not have throw rugs and other things on the floor that can make you trip. What can I do in the bedroom? Use night lights. Make sure that you have a light by your bed that is easy to reach. Do not use any sheets or blankets that are too big for your bed. They should not hang down onto the floor. Have a firm chair that has side arms. You can use this for support while you get dressed. Do not have throw rugs and other things on the floor that can make you trip. What can I do in the kitchen? Clean up any spills right away. Avoid walking on wet floors. Keep items that you use a lot in easy-to-reach places. If you need to reach something above you, use a strong step stool that has a grab bar. Keep electrical cords out of the way. Do not use floor polish or wax that makes floors slippery. If you must use wax, use non-skid floor wax. Do not have throw rugs and other things on the floor that can make you trip.  What can I do with my stairs? Do not leave any items on the stairs. Make sure that there are handrails on both sides of the stairs and use them. Fix handrails that are broken or loose. Make sure that handrails are as long as the stairways. Check any carpeting to make sure that it is firmly attached to the stairs. Fix any carpet that is loose or worn. Avoid having throw rugs at the top or bottom of the stairs. If you do have throw rugs, attach them to the floor with carpet tape. Make sure that you have a light switch at the top of the stairs and the bottom of the stairs. If you do not have them, ask someone to add them for you. What else can I do to help prevent falls? Wear shoes that: Do not have high heels. Have rubber bottoms. Are comfortable and fit you well. Are closed at the toe. Do not wear sandals. If you use a stepladder: Make sure that it is fully opened. Do not climb a closed  stepladder. Make sure that both sides of the stepladder are locked into place. Ask someone to hold it for you, if possible. Clearly mark and make sure that you can see: Any grab bars or handrails. First and last steps. Where the edge of each step is. Use tools that help you move around (mobility aids) if they are needed. These include: Canes. Walkers. Scooters. Crutches. Turn on the lights when you go into a dark area. Replace any light bulbs as soon as they burn out. Set up your furniture so you have a clear path. Avoid moving your furniture around. If any of your floors are uneven, fix them. If there are any pets around you, be aware of where they are. Review your medicines with your doctor. Some medicines can make you feel dizzy. This can increase your chance of falling. Ask your doctor what other things that you can do to help prevent falls. This information is not intended to replace advice given to you by your health care provider. Make sure you discuss any questions you have with your health care provider. Document Released: 04/10/2009 Document Revised: 11/20/2015 Document Reviewed: 07/19/2014 Elsevier Interactive Patient Education  2017 [employer].

## 2022-03-29 ENCOUNTER — Other Ambulatory Visit: Payer: Self-pay | Admitting: Family Medicine

## 2022-03-29 NOTE — Telephone Encounter (Signed)
Medication: metFORMIN (GLUCOPHAGE-XR) 500 MG 24 hr tablet  Has the patient contacted their pharmacy? Yes.     Preferred Pharmacy (with phone number or street name):  [facility], [place] - [address], [place] [address]  Phone:  [phone]  Fax:  [phone]

## 2022-04-01 DIAGNOSIS — J208 Acute bronchitis due to other specified organisms: Secondary | ICD-10-CM | POA: Diagnosis not present

## 2022-04-01 DIAGNOSIS — B9689 Other specified bacterial agents as the cause of diseases classified elsewhere: Secondary | ICD-10-CM | POA: Diagnosis not present

## 2022-04-16 ENCOUNTER — Other Ambulatory Visit: Payer: Self-pay | Admitting: *Deleted

## 2022-04-16 DIAGNOSIS — I7143 Infrarenal abdominal aortic aneurysm, without rupture: Secondary | ICD-10-CM

## 2022-04-20 ENCOUNTER — Other Ambulatory Visit: Payer: Self-pay | Admitting: Family Medicine

## 2022-04-20 ENCOUNTER — Encounter: Payer: Self-pay | Admitting: Family Medicine

## 2022-04-20 ENCOUNTER — Other Ambulatory Visit: Payer: Medicare Other

## 2022-04-20 ENCOUNTER — Ambulatory Visit (INDEPENDENT_AMBULATORY_CARE_PROVIDER_SITE_OTHER): Payer: Medicare Other | Admitting: Family Medicine

## 2022-04-20 VITALS — BP 112/70 | HR 63 | Temp 97.0°F | Ht 64.0 in | Wt 167.5 lb

## 2022-04-20 DIAGNOSIS — Z87891 Personal history of nicotine dependence: Secondary | ICD-10-CM

## 2022-04-20 DIAGNOSIS — E1165 Type 2 diabetes mellitus with hyperglycemia: Secondary | ICD-10-CM

## 2022-04-20 DIAGNOSIS — I10 Essential (primary) hypertension: Secondary | ICD-10-CM

## 2022-04-20 DIAGNOSIS — R413 Other amnesia: Secondary | ICD-10-CM

## 2022-04-20 DIAGNOSIS — E782 Mixed hyperlipidemia: Secondary | ICD-10-CM

## 2022-04-20 LAB — COMPREHENSIVE METABOLIC PANEL
ALT: 15 U/L (ref 0–53)
AST: 15 U/L (ref 0–37)
Albumin: 4.4 g/dL (ref 3.5–5.2)
Alkaline Phosphatase: 26 U/L — ABNORMAL LOW (ref 39–117)
BUN: 17 mg/dL (ref 6–23)
CO2: 28 mEq/L (ref 19–32)
Calcium: 9.4 mg/dL (ref 8.4–10.5)
Chloride: 103 mEq/L (ref 96–112)
Creatinine, Ser: 0.85 mg/dL (ref 0.40–1.50)
GFR: 88.31 mL/min (ref >60.00)
Glucose, Bld: 141 mg/dL — ABNORMAL HIGH (ref 70–99)
Potassium: 4.2 mEq/L (ref 3.5–5.1)
Sodium: 139 mEq/L (ref 135–145)
Total Bilirubin: 0.5 mg/dL (ref 0.2–1.2)
Total Protein: 6.4 g/dL (ref 6.0–8.3)

## 2022-04-20 LAB — LIPID PANEL
Cholesterol: 92 mg/dL (ref 0–200)
HDL: 34.2 mg/dL — ABNORMAL LOW (ref >39.00)
LDL Cholesterol: 32 mg/dL (ref 0–99)
NonHDL: 57.32
Total CHOL/HDL Ratio: 3
Triglycerides: 128 mg/dL (ref 0.0–149.0)
VLDL: 25.6 mg/dL (ref 0.0–40.0)

## 2022-04-20 LAB — VITAMIN B12: Vitamin B-12: 342 pg/mL (ref 211–911)

## 2022-04-20 LAB — TSH: TSH: 1.55 u[IU]/mL (ref 0.35–5.50)

## 2022-04-20 MED ORDER — METFORMIN HCL ER 500 MG PO TB24: 1000.0000 mg | ORAL_TABLET | Freq: Every day | ORAL | 2 refills | Status: DC

## 2022-04-20 NOTE — Addendum Note (Signed)
Addended by: Manuela Schwartz on: 04/20/2022 03:19 PM   Modules accepted: Orders

## 2022-04-20 NOTE — Patient Instructions (Addendum)
Give [place] 2-3 business days to get the results of your labs back. If labs are normal, we will get a scan of the brain.   Keep the diet clean and stay active.  Let us know if you need anything.  Please consider counseling. Contact [phone] to schedule an appointment or inquire about cost/insurance coverage.  [facility] located at [address], [place], [place].  Phone number = [phone].  Dr. Lennice Sites - Adult Psychiatry.    [facility] located at [place], [place], [place]. Phone number = [phone].   [facility] located at [address] [place], [place].  Phone number = [phone].   [facility] located at [place], [place], [place].  Phone number = [phone].

## 2022-04-20 NOTE — Progress Notes (Signed)
Subjective:   Chief Complaint  Patient presents with   Follow-up    6 month     Troy Boyer is a 71 y.o. male here for follow-up of diabetes.  Here w wife.  Jabin's self monitored glucose range is low 100's.  Patient denies hypoglycemic reactions. He checks his glucose levels several times per week Patient does not require insulin.   Medications include: Metformin XR 1000 mg/d Diet is healthy.  Exercise: walking, lifting  Hypertension Patient presents for hypertension follow up. He does monitor home blood pressures. He is compliant with medications- Altace 10 mg bid, Norvasc 5 mg/d. Patient has these side effects of medication: none Diet/exercise as above.   Hyperlipidemia Patient presents for dyslipidemia follow up. Currently being treated with Zetia, Lipitor 80 mg/d, Tricor 48 mg/d and compliance with treatment thus far has been good. He denies myalgias. Diet/exercise as above.  The patient is known to have coexisting coronary artery disease.  The patient has been having issues with memory and irritability.  He is not on any medication for anxiety/irritability.  His wife, who is also here, is very frustrated with the situation.  She also notes that the patient has quite a bit of road rage.  He is not following with a therapist/counselor.  Past Medical History:  Diagnosis Date   Adenomatous colon polyp    tubular   CAD (coronary artery disease)    a. Cath 06/08/01 at [facility] with normal LM and LAD, 95% LCx s/p Circumflex stent 4.0 x 15 mm Penta and 85% and 80% prox RCA s/p 3.5 x 23 mm Penta b. cath 01/20/2015 95% prox LCx ISR treated with DES, 55% prox to mid RCA, 45% distal RCA, 40% midLAD, EF 55%     Cataract    Diabetes mellitus without complication (HCC)    Diverticulosis    Erectile dysfunction    Gallstones    Hyperlipidemia    Hypertension    Myocardial infarction [facility])      Related testing: Retinal exam: Done Pneumovax: done  Objective:  BP 112/70  (BP Location: Left Arm, Patient Position: Sitting, Cuff Size: Normal)   Pulse 63   Temp (!) 97 F (36.1 C) (Oral)   Ht '5\' 4"'$  (1.626 m)   Wt 167 lb 8 oz (76 kg)   SpO2 97%   BMI 28.75 kg/m  General:  Well developed, well nourished, in no apparent distress Skin:  Warm, no pallor or diaphoresis Lungs:  CTAB, no access msc use Cardio:  RRR, no bruits, no LE edema Psych: Age appropriate judgment and insight  Assessment:   Type 2 diabetes mellitus with hyperglycemia, without long-term current use of insulin (HCC) - Plan: Comprehensive metabolic panel, Lipid panel, Hemoglobin A1c  Mixed hyperlipidemia  Essential hypertension  Stopped smoking with greater than 20 pack year history - Plan: Ambulatory Referral Lung Cancer Screening [facility]  Memory changes - Plan: TSH, B12   Plan:   Chronic, hopefully stable.  Check labs.  Continue metformin XR 1000 mg daily.  Counseled on diet and exercise. Chronic, stable.  Continue Lipitor 80 mg daily, Zetia 10 mg daily, fenofibrate 48 mg daily. Chronic, stable.  Continue Norvasc 5 mg daily, Altace 10 mg daily. Refer to lung cancer screening team. Add TSH and B12 to labs.  If normal, will check an MRI of his head.  If that is normal, we will set him up with the neuropsychology team.  Counseling information also provided. I will tentatively, pending results, see  him in 6 months for physical or as needed. The patient and his spouse voiced understanding and agreement to the plan.  [place], DO 04/20/22 12:02 PM

## 2022-04-20 NOTE — Addendum Note (Signed)
Addended by: Kelle Darting A on: 04/20/2022 02:40 PM   Modules accepted: Orders

## 2022-04-21 LAB — HEMOGLOBIN A1C: Hgb A1c MFr Bld: 7.6 % — ABNORMAL HIGH (ref 4.6–6.5)

## 2022-04-23 ENCOUNTER — Ambulatory Visit: Payer: Medicare Other | Admitting: Family Medicine

## 2022-04-26 ENCOUNTER — Other Ambulatory Visit: Payer: Self-pay | Admitting: Family Medicine

## 2022-04-26 DIAGNOSIS — I2510 Atherosclerotic heart disease of native coronary artery without angina pectoris: Secondary | ICD-10-CM

## 2022-04-26 MED ORDER — METFORMIN HCL ER 500 MG PO TB24: 1000.0000 mg | ORAL_TABLET | Freq: Every day | ORAL | 0 refills | Status: DC

## 2022-04-26 MED ORDER — NITROGLYCERIN 0.4 MG SL SUBL: 0.4000 mg | SUBLINGUAL_TABLET | SUBLINGUAL | 3 refills | Status: DC | PRN

## 2022-04-28 ENCOUNTER — Ambulatory Visit (HOSPITAL_COMMUNITY)
Admission: RE | Admit: 2022-04-28 | Discharge: 2022-04-28 | Disposition: A | Discharge status: Home / Self Care | Payer: Medicare Other | Source: Ambulatory Visit | Attending: Cardiology | Admitting: Cardiology

## 2022-04-28 DIAGNOSIS — I7143 Infrarenal abdominal aortic aneurysm, without rupture: Secondary | ICD-10-CM | POA: Insufficient documentation

## 2022-04-30 ENCOUNTER — Encounter: Payer: Self-pay | Admitting: Family Medicine

## 2022-05-15 ENCOUNTER — Other Ambulatory Visit: Payer: Self-pay | Admitting: Family Medicine

## 2022-05-18 ENCOUNTER — Other Ambulatory Visit: Payer: Self-pay | Admitting: Family Medicine

## 2022-05-18 DIAGNOSIS — I714 Abdominal aortic aneurysm, without rupture, unspecified: Secondary | ICD-10-CM

## 2022-05-19 ENCOUNTER — Other Ambulatory Visit: Payer: Self-pay | Admitting: Family Medicine

## 2022-05-19 DIAGNOSIS — G4700 Insomnia, unspecified: Secondary | ICD-10-CM

## 2022-05-24 ENCOUNTER — Encounter: Payer: Self-pay | Admitting: Family Medicine

## 2022-05-25 ENCOUNTER — Other Ambulatory Visit: Payer: Self-pay | Admitting: Family Medicine

## 2022-05-25 MED ORDER — ACCU-CHEK GUIDE VI STRP: ORAL_STRIP | 3 refills | Status: DC

## 2022-05-26 ENCOUNTER — Telehealth: Payer: Self-pay | Admitting: Family Medicine

## 2022-05-26 NOTE — Telephone Encounter (Signed)
Patient called and informed of information. [place] canceled. Patient given [facility]. Information to call.

## 2022-05-26 NOTE — Telephone Encounter (Signed)
We can cancel my US aorta as this was done by Dr. Stanford Breed and if he is receiving calls that is likely confusing. He was referred to pulm for discussion of lung cancer screening, OK to provide with that info. Ty.

## 2022-05-26 NOTE — Telephone Encounter (Signed)
Patient states he was supposed to get a lung scan not an aorta US. He would like to know which scan he is supposed to do since he already got the US aorta done at his cardiologist office. Please advise.

## 2022-05-28 ENCOUNTER — Other Ambulatory Visit: Payer: Self-pay | Admitting: *Deleted

## 2022-05-28 DIAGNOSIS — Z87891 Personal history of nicotine dependence: Secondary | ICD-10-CM

## 2022-05-28 DIAGNOSIS — Z122 Encounter for screening for malignant neoplasm of respiratory organs: Secondary | ICD-10-CM

## 2022-06-01 ENCOUNTER — Encounter: Payer: Self-pay | Admitting: Family Medicine

## 2022-06-11 ENCOUNTER — Ambulatory Visit (INDEPENDENT_AMBULATORY_CARE_PROVIDER_SITE_OTHER): Payer: Medicare Other | Admitting: Family Medicine

## 2022-06-11 ENCOUNTER — Encounter: Payer: Self-pay | Admitting: Family Medicine

## 2022-06-11 VITALS — BP 112/66 | HR 65 | Temp 98.4°F | Ht 65.0 in | Wt 170.4 lb

## 2022-06-11 DIAGNOSIS — R2689 Other abnormalities of gait and mobility: Secondary | ICD-10-CM | POA: Diagnosis not present

## 2022-06-11 DIAGNOSIS — I499 Cardiac arrhythmia, unspecified: Secondary | ICD-10-CM

## 2022-06-11 DIAGNOSIS — E119 Type 2 diabetes mellitus without complications: Secondary | ICD-10-CM | POA: Diagnosis not present

## 2022-06-11 LAB — HM DIABETES EYE EXAM

## 2022-06-11 NOTE — Progress Notes (Signed)
Chief Complaint  Patient presents with   Follow-up    Fatigue has gotten better     Subjective: Patient is a 71 y.o. male here for concern for A fib.  Smart watch told him he had A fib last week. He felt fine when it happened. He has checked several times since then and it said normal sinus rhythm. Cutting down on alcohol. No change in caffeine intake. No illicit drug use. Eating and drinking normally. No lightheadedness.   Pt's steadiness is improving.  He is working around 3 times a week rather than once or twice.  Once every couple months, he will have a random episode where he loses balance.  What time, he lost balance sitting down.  This is very short-lived.  No correlation with eating/drinking.  No illicit drug use.  He does stay well-hydrated.  Past Medical History:  Diagnosis Date   Adenomatous colon polyp    tubular   CAD (coronary artery disease)    a. Cath 06/08/01 at [facility] with normal LM and LAD, 95% LCx s/p Circumflex stent 4.0 x 15 mm Penta and 85% and 80% prox RCA s/p 3.5 x 23 mm Penta b. cath 01/20/2015 95% prox LCx ISR treated with DES, 55% prox to mid RCA, 45% distal RCA, 40% midLAD, EF 55%     Cataract    Diabetes mellitus without complication (HCC)    Diverticulosis    Erectile dysfunction    Gallstones    Hyperlipidemia    Hypertension    Myocardial infarction (HCC)     Objective: BP 112/66 (BP Location: Left Arm, Patient Position: Sitting, Cuff Size: Normal)   Pulse 65   Temp 98.4 F (36.9 C) (Oral)   Ht '5\' 5"'$  (1.651 m)   Wt 170 lb 6 oz (77.3 kg)   SpO2 98%   BMI 28.35 kg/m  General: Awake, appears stated age Heart: RRR, no LE edema Lungs: CTAB, no rales, wheezes or rhonchi. No accessory muscle use Neuro: DTRs equal and symmetric throughout, no clonus, no cerebellar signs, 5/5 strength throughout, gait is normal today.  Alert and oriented to person, place, time, and president Psych: Age appropriate judgment and insight, normal affect and  mood  Assessment and Plan: Irregular heart beat  Balance problem  Reassurance for now.  He is followed by the cardiology team.  Could return to them for a longer-term monitor if it becomes a recurrent issue. This seems to be improving.  I did offer physical therapy which he politely declined for now.  He will take me up on this via MyChart if he changes his mind. I will see him as originally scheduled otherwise. The patient voiced understanding and agreement to the plan.  [place], DO 06/11/22  1:04 PM

## 2022-06-11 NOTE — Patient Instructions (Signed)
Stay active. If you want to see physical therapy to help with balance, let me know.   If anything changes with the heart rate/rhythm, let me (or Dr. Stanford Breed) know.   Let us know if you need anything.

## 2022-06-14 ENCOUNTER — Institutional Professional Consult (permissible substitution): Payer: Medicare Other | Admitting: Pulmonary Disease

## 2022-06-25 ENCOUNTER — Other Ambulatory Visit: Payer: Self-pay | Admitting: Cardiology

## 2022-07-12 DIAGNOSIS — L821 Other seborrheic keratosis: Secondary | ICD-10-CM | POA: Diagnosis not present

## 2022-07-12 DIAGNOSIS — D1801 Hemangioma of skin and subcutaneous tissue: Secondary | ICD-10-CM | POA: Diagnosis not present

## 2022-07-12 DIAGNOSIS — L570 Actinic keratosis: Secondary | ICD-10-CM | POA: Diagnosis not present

## 2022-07-12 DIAGNOSIS — L814 Other melanin hyperpigmentation: Secondary | ICD-10-CM | POA: Diagnosis not present

## 2022-07-12 DIAGNOSIS — L578 Other skin changes due to chronic exposure to nonionizing radiation: Secondary | ICD-10-CM | POA: Diagnosis not present

## 2022-07-12 DIAGNOSIS — Z85828 Personal history of other malignant neoplasm of skin: Secondary | ICD-10-CM | POA: Diagnosis not present

## 2022-07-12 DIAGNOSIS — I781 Nevus, non-neoplastic: Secondary | ICD-10-CM | POA: Diagnosis not present

## 2022-07-12 DIAGNOSIS — Z808 Family history of malignant neoplasm of other organs or systems: Secondary | ICD-10-CM | POA: Diagnosis not present

## 2022-07-20 ENCOUNTER — Ambulatory Visit (INDEPENDENT_AMBULATORY_CARE_PROVIDER_SITE_OTHER): Payer: Medicare Other | Admitting: Acute Care

## 2022-07-20 ENCOUNTER — Encounter: Payer: Self-pay | Admitting: Acute Care

## 2022-07-20 ENCOUNTER — Ambulatory Visit
Admission: RE | Admit: 2022-07-20 | Discharge: 2022-07-20 | Disposition: A | Discharge status: Home / Self Care | Payer: Medicare Other | Source: Ambulatory Visit | Attending: Acute Care | Admitting: Acute Care

## 2022-07-20 DIAGNOSIS — Z87891 Personal history of nicotine dependence: Secondary | ICD-10-CM

## 2022-07-20 DIAGNOSIS — Z122 Encounter for screening for malignant neoplasm of respiratory organs: Secondary | ICD-10-CM

## 2022-07-20 NOTE — Patient Instructions (Signed)
Thank you for participating in the [facility]. It was our pleasure to meet you today. We will call you with the results of your scan within the next few days. Your scan will be assigned a Lung RADS category score by the physicians reading the scans.  This Lung RADS score determines follow up scanning.  See below for description of categories, and follow up screening recommendations. We will be in touch to schedule your follow up screening annually or based on recommendations of our providers. We will fax a copy of your scan results to your Primary Care Physician, or the physician who referred you to the program, to ensure they have the results. Please call the office if you have any questions or concerns regarding your scanning experience or results.  Our office number is [phone]. Please speak with Denise Phelps, RN. , or  Denise Buckner RN, They are  our Lung Cancer Screening RN.'s If They are unavailable when you call, Please leave a message on the voice mail. We will return your call at our earliest convenience.This voice mail is monitored several times a day.  Remember, if your scan is normal, we will scan you annually as long as you continue to meet the criteria for the program. (Age 50-80, Current smoker or smoker who has quit within the last 15 years). If you are a smoker, remember, quitting is the single most powerful action that you can take to decrease your risk of lung cancer and other pulmonary, breathing related problems. We know quitting is hard, and we are here to help.  Please let us know if there is anything we can do to help you meet your goal of quitting. If you are a former smoker, congratulations. We are proud of you! Remain smoke free! Remember you can refer friends or family members through the number above.  We will screen them to make sure they meet criteria for the program. Thank you for helping us take better care of you by  participating in Lung Screening.  You can receive free nicotine replacement therapy ( patches, gum or mints) by calling 1-800-QUIT NOW. Please call so we can get you on the path to becoming  a non-smoker. I know it is hard, but you can do this!  Lung RADS Categories:  Lung RADS 1: no nodules or definitely non-concerning nodules.  Recommendation is for a repeat annual scan in 12 months.  Lung RADS 2:  nodules that are non-concerning in appearance and behavior with a very low likelihood of becoming an active cancer. Recommendation is for a repeat annual scan in 12 months.  Lung RADS 3: nodules that are probably non-concerning , includes nodules with a low likelihood of becoming an active cancer.  Recommendation is for a 6-month repeat screening scan. Often noted after an upper respiratory illness. We will be in touch to make sure you have no questions, and to schedule your 6-month scan.  Lung RADS 4 A: nodules with concerning findings, recommendation is most often for a follow up scan in 3 months or additional testing based on our provider's assessment of the scan. We will be in touch to make sure you have no questions and to schedule the recommended 3 month follow up scan.  Lung RADS 4 B:  indicates findings that are concerning. We will be in touch with you to schedule additional diagnostic testing based on our provider's  assessment of the scan.  Other options for assistance in smoking cessation (   As covered by your insurance benefits)  Hypnosis for smoking cessation  [employer]. [phone]  Acupuncture for smoking cessation  [employer] [phone]

## 2022-07-20 NOTE — Progress Notes (Addendum)
Virtual Visit via Telephone Note  I connected with Troy Boyer on 07/20/22 at  1:30 PM EST by telephone and verified that I am speaking with the correct person using two identifiers.  Location: Patient:  At home Provider: [address], [place], [place], [address]    I discussed the limitations, risks, security and privacy concerns of performing an evaluation and management service by telephone and the availability of in person appointments. I also discussed with the patient that there may be a patient responsible charge related to this service. The patient expressed understanding and agreed to proceed.    Shared Decision Making Visit Lung Cancer Screening Program [phone])   Eligibility: Age 72 y.o. Pack Years Smoking History Calculation 45 pack year smoking history (# packs/per year x # years smoked) Recent History of coughing up blood  no Unexplained weight loss? no ( >Than 15 pounds within the last 6 months ) Prior History Lung / other cancer no (Diagnosis within the last 5 years already requiring surveillance chest CT Scans). Smoking Status Former Smoker Former Smokers: Years since quit:  8 years   Quit Date:  01/17/2015  Visit Components: Discussion included one or more decision making aids. yes Discussion included risk/benefits of screening. yes Discussion included potential follow up diagnostic testing for abnormal scans. yes Discussion included meaning and risk of over diagnosis. yes Discussion included meaning and risk of False Positives. yes Discussion included meaning of total radiation exposure. yes  Counseling Included: Importance of adherence to annual lung cancer LDCT screening. yes Impact of comorbidities on ability to participate in the program. yes Ability and willingness to under diagnostic treatment. yes  Smoking Cessation Counseling: Current Smokers:  Discussed importance of smoking cessation. yes Information about tobacco cessation classes and  interventions provided to patient. yes Patient provided with "ticket" for LDCT Scan. yes Symptomatic Patient. no  Counseling NA Diagnosis Code: Tobacco Use Z72.0 Asymptomatic Patient yes  Counseling (Intermediate counseling: > three minutes counseling) S2831 Former Smokers:  Discussed the importance of maintaining cigarette abstinence. yes Diagnosis Code: Personal History of Nicotine Dependence. D17.616 Information about tobacco cessation classes and interventions provided to patient. Yes Patient provided with "ticket" for LDCT Scan. yes Written Order for Lung Cancer Screening with LDCT placed in Epic. Yes (CT Chest Lung Cancer Screening Low Dose W/O CM) [id number] Z12.2-Screening of respiratory organs Z87.891-Personal history of nicotine dependence  I spent 25 minutes of face to face time/virtual visit time  with  Troy Boyer discussing the risks and benefits of lung cancer screening. We took the time to pause the power point at intervals to allow for questions to be asked and answered to ensure understanding. We discussed that he had taken the single most powerful action possible to decrease his risk of developing lung cancer when he quit smoking. I counseled him to remain smoke free, and to contact me if he ever had the desire to smoke again so that I can provide resources and tools to help support the effort to remain smoke free. We discussed the time and location of the scan, and that either  Doroteo Glassman RN, Joella Prince, RN or I  or I will call / send a letter with the results within  24-72 hours of receiving them. He has the office contact information in the event he needs to speak with me,  he verbalized understanding of all of the above and had no further questions upon leaving the office.     I explained to the patient  that there has been a high incidence of coronary artery disease noted on these exams. I explained that this is a non-gated exam therefore degree or severity cannot be  determined. This patient is on statin therapy. I have asked the patient to follow-up with their PCP regarding any incidental finding of coronary artery disease and management with diet or medication as they feel is clinically indicated. The patient verbalized understanding of the above and had no further questions.     Magdalen Spatz, NP 07/20/2022

## 2022-07-22 ENCOUNTER — Encounter: Payer: Self-pay | Admitting: Cardiology

## 2022-07-22 ENCOUNTER — Encounter: Payer: Self-pay | Admitting: Family Medicine

## 2022-07-22 ENCOUNTER — Other Ambulatory Visit: Payer: Self-pay

## 2022-07-22 DIAGNOSIS — Z122 Encounter for screening for malignant neoplasm of respiratory organs: Secondary | ICD-10-CM

## 2022-07-22 DIAGNOSIS — Z87891 Personal history of nicotine dependence: Secondary | ICD-10-CM

## 2022-07-22 DIAGNOSIS — I359 Nonrheumatic aortic valve disorder, unspecified: Secondary | ICD-10-CM

## 2022-07-22 DIAGNOSIS — I059 Rheumatic mitral valve disease, unspecified: Secondary | ICD-10-CM

## 2022-07-22 NOTE — Telephone Encounter (Signed)
Faxed records request to [facility].

## 2022-07-25 ENCOUNTER — Encounter: Payer: Self-pay | Admitting: Family Medicine

## 2022-07-27 ENCOUNTER — Telehealth (INDEPENDENT_AMBULATORY_CARE_PROVIDER_SITE_OTHER): Payer: Medicare Other | Admitting: Family Medicine

## 2022-07-27 ENCOUNTER — Encounter: Payer: Self-pay | Admitting: Family Medicine

## 2022-07-27 DIAGNOSIS — U071 COVID-19: Secondary | ICD-10-CM | POA: Diagnosis not present

## 2022-07-27 MED ORDER — BENZONATATE 200 MG PO CAPS: 200.0000 mg | ORAL_CAPSULE | Freq: Two times a day (BID) | ORAL | 0 refills | Status: DC | PRN

## 2022-07-27 MED ORDER — NIRMATRELVIR/RITONAVIR (PAXLOVID)TABLET: 3.0000 | ORAL_TABLET | Freq: Two times a day (BID) | ORAL | 0 refills | Status: AC

## 2022-07-27 NOTE — Progress Notes (Signed)
Chief Complaint  Patient presents with   Covid Positive    [name] here for URI complaints. Due to COVID-19 pandemic, we are interacting via web portal for an electronic face-to-face visit. I verified patient's ID using 2 identifiers. Patient agreed to proceed with visit via this method. Patient is at home, I am at office. Patient and I are present for visit.   Duration: 2 days  Associated symptoms: Fever (99.7 F), sinus headache, sinus congestion, rhinorrhea, and coughing, fatigue, PND Denies: sinus pain, itchy watery eyes, ear pain, ear drainage, sore throat, wheezing, shortness of breath, myalgia, and N/V/D, loss of taste/smell Treatment to date: cough syrup Sick contacts: No Tested + for covid x 2.   Past Medical History:  Diagnosis Date   Adenomatous colon polyp    tubular   CAD (coronary artery disease)    a. Cath 06/08/01 at [facility] with normal LM and LAD, 95% LCx s/p Circumflex stent 4.0 x 15 mm Penta and 85% and 80% prox RCA s/p 3.5 x 23 mm Penta b. cath 01/20/2015 95% prox LCx ISR treated with DES, 55% prox to mid RCA, 45% distal RCA, 40% midLAD, EF 55%     Cataract    Diabetes mellitus without complication (HCC)    Diverticulosis    Erectile dysfunction    Gallstones    Hyperlipidemia    Hypertension    Myocardial infarction ([place])     Objective No conversational dyspnea Age appropriate judgment and insight Nml affect and mood  COVID-19 - Plan: nirmatrelvir/ritonavir (PAXLOVID) 20 x 150 MG & 10 x '100MG'$  TABS, benzonatate (TESSALON) 200 MG capsule  Continue to push fluids, practice good hand hygiene, cover mouth when coughing. CDC quarantining guidelines discussed. Hold Lipitor/atorvastatin for 8 d (5 d while on Paxlovid + 3 more d).  F/u prn. If starting to experience fevers, shaking, or shortness of breath, seek immediate care. Pt voiced understanding and agreement to the plan.  [place], DO 07/27/22 2:31 PM

## 2022-08-12 DIAGNOSIS — R350 Frequency of micturition: Secondary | ICD-10-CM | POA: Diagnosis not present

## 2022-08-16 ENCOUNTER — Ambulatory Visit (HOSPITAL_COMMUNITY): Payer: Medicare Other | Attending: Internal Medicine

## 2022-08-16 DIAGNOSIS — I059 Rheumatic mitral valve disease, unspecified: Secondary | ICD-10-CM

## 2022-08-16 DIAGNOSIS — I359 Nonrheumatic aortic valve disorder, unspecified: Secondary | ICD-10-CM | POA: Diagnosis not present

## 2022-08-16 LAB — ECHOCARDIOGRAM COMPLETE
Area-P 1/2: 2.62 cm2
MV VTI: 1.89 cm2
S' Lateral: 2.8 cm

## 2022-08-20 ENCOUNTER — Encounter: Payer: Self-pay | Admitting: *Deleted

## 2022-08-31 DIAGNOSIS — H43811 Vitreous degeneration, right eye: Secondary | ICD-10-CM | POA: Diagnosis not present

## 2022-09-06 DIAGNOSIS — J069 Acute upper respiratory infection, unspecified: Secondary | ICD-10-CM | POA: Diagnosis not present

## 2022-09-28 DIAGNOSIS — H43811 Vitreous degeneration, right eye: Secondary | ICD-10-CM | POA: Diagnosis not present

## 2022-10-13 ENCOUNTER — Other Ambulatory Visit: Payer: Self-pay | Admitting: Family Medicine

## 2022-10-13 DIAGNOSIS — G4700 Insomnia, unspecified: Secondary | ICD-10-CM

## 2022-10-20 ENCOUNTER — Other Ambulatory Visit: Payer: Self-pay | Admitting: Family Medicine

## 2022-10-20 ENCOUNTER — Ambulatory Visit (INDEPENDENT_AMBULATORY_CARE_PROVIDER_SITE_OTHER): Payer: Medicare Other | Admitting: Family Medicine

## 2022-10-20 ENCOUNTER — Encounter: Payer: Self-pay | Admitting: Family Medicine

## 2022-10-20 VITALS — BP 108/68 | HR 65 | Temp 98.0°F | Ht 64.0 in | Wt 170.1 lb

## 2022-10-20 DIAGNOSIS — E1165 Type 2 diabetes mellitus with hyperglycemia: Secondary | ICD-10-CM

## 2022-10-20 DIAGNOSIS — Z125 Encounter for screening for malignant neoplasm of prostate: Secondary | ICD-10-CM | POA: Diagnosis not present

## 2022-10-20 DIAGNOSIS — Z0001 Encounter for general adult medical examination with abnormal findings: Secondary | ICD-10-CM | POA: Diagnosis not present

## 2022-10-20 DIAGNOSIS — R2689 Other abnormalities of gait and mobility: Secondary | ICD-10-CM | POA: Diagnosis not present

## 2022-10-20 DIAGNOSIS — Z7984 Long term (current) use of oral hypoglycemic drugs: Secondary | ICD-10-CM

## 2022-10-20 DIAGNOSIS — Z0000 Encounter for general adult medical examination without abnormal findings: Secondary | ICD-10-CM

## 2022-10-20 DIAGNOSIS — R972 Elevated prostate specific antigen [PSA]: Secondary | ICD-10-CM

## 2022-10-20 LAB — COMPREHENSIVE METABOLIC PANEL
ALT: 22 U/L (ref 0–53)
AST: 18 U/L (ref 0–37)
Albumin: 4.3 g/dL (ref 3.5–5.2)
Alkaline Phosphatase: 25 U/L — ABNORMAL LOW (ref 39–117)
BUN: 14 mg/dL (ref 6–23)
CO2: 27 mEq/L (ref 19–32)
Calcium: 9.2 mg/dL (ref 8.4–10.5)
Chloride: 105 mEq/L (ref 96–112)
Creatinine, Ser: 0.92 mg/dL (ref 0.40–1.50)
GFR: 84.24 mL/min (ref >60.00)
Glucose, Bld: 144 mg/dL — ABNORMAL HIGH (ref 70–99)
Potassium: 4.3 mEq/L (ref 3.5–5.1)
Sodium: 142 mEq/L (ref 135–145)
Total Bilirubin: 0.5 mg/dL (ref 0.2–1.2)
Total Protein: 6.3 g/dL (ref 6.0–8.3)

## 2022-10-20 LAB — MICROALBUMIN / CREATININE URINE RATIO
Creatinine,U: 145.6 mg/dL
Microalb Creat Ratio: 0.9 mg/g (ref 0.0–30.0)
Microalb, Ur: 1.4 mg/dL (ref 0.0–1.9)

## 2022-10-20 LAB — LIPID PANEL
Cholesterol: 92 mg/dL (ref 0–200)
HDL: 31.3 mg/dL — ABNORMAL LOW (ref >39.00)
LDL Cholesterol: 35 mg/dL (ref 0–99)
NonHDL: 60.56
Total CHOL/HDL Ratio: 3
Triglycerides: 130 mg/dL (ref 0.0–149.0)
VLDL: 26 mg/dL (ref 0.0–40.0)

## 2022-10-20 LAB — PSA, MEDICARE: PSA: 2.83 ng/ml (ref 0.10–4.00)

## 2022-10-20 LAB — HEMOGLOBIN A1C: Hgb A1c MFr Bld: 8 % — ABNORMAL HIGH (ref 4.6–6.5)

## 2022-10-20 MED ORDER — METFORMIN HCL ER 500 MG PO TB24: 1000.0000 mg | ORAL_TABLET | Freq: Two times a day (BID) | ORAL | 3 refills | Status: DC

## 2022-10-20 NOTE — Patient Instructions (Addendum)
Give us 2-3 business days to get the results of your labs back.   Keep the diet clean and stay active.  Aim to do some physical exertion for 150 minutes per week. This is typically divided into 5 days per week, 30 minutes per day. The activity should be enough to get your heart rate up. Anything is better than nothing if you have time constraints.  If you do not hear anything about your referral in the next 1-2 weeks, call our office and ask for an update.  Let us know if you need anything. 

## 2022-10-20 NOTE — Progress Notes (Signed)
Chief Complaint  Patient presents with   Follow-up    6 month     Well Male Child Troy Boyer is here for a complete physical.   His last physical was >1 year ago.  Current diet: in general, a "healthy" diet.   Current exercise: lifting wts, cardio Weight trend: stable Fatigue out of ordinary? No. Seat belt? Yes.   Advanced directive?  Yes  Health maintenance Shingrix- Yes Colonoscopy- Yes Tetanus- Yes Hep C- Yes Lung cancer screening- Yes Pneumonia vaccine- Yes AAA screening- Yes  Balance issues Over the past 3 months, the patient's balance has worsened on uneven surfaces.  Walking on a flat surface there is no issue.  He does not get lightheaded, dizzy, or experience any spinning.  Denies any vision changes.  Exercise routine as above.  He is not having any pain.  Past Medical History:  Diagnosis Date   Adenomatous colon polyp    tubular   CAD (coronary artery disease)    a. Cath 06/08/01 at [facility] with normal LM and LAD, 95% LCx s/p Circumflex stent 4.0 x 15 mm Penta and 85% and 80% prox RCA s/p 3.5 x 23 mm Penta b. cath 01/20/2015 95% prox LCx ISR treated with DES, 55% prox to mid RCA, 45% distal RCA, 40% midLAD, EF 55%     Cataract    Diabetes mellitus without complication    Diverticulosis    Erectile dysfunction    Gallstones    Hyperlipidemia    Hypertension    Myocardial infarction      Past Surgical History:  Procedure Laterality Date   ANGIOPLASTY  2002   2 stents   CARDIAC CATHETERIZATION N/A 01/20/2015   Procedure: Left Heart Cath and Coronary Angiography;  Surgeon: Lyn Records, MD;  Location: [facility] INVASIVE CV LAB;  Service: Cardiovascular;  Laterality: N/A;   CARDIAC CATHETERIZATION N/A 01/27/2016   Procedure: Left Heart Cath and Coronary Angiography;  Surgeon: Tonny Bollman, MD;  Location: [facility] INVASIVE CV LAB;  Service: Cardiovascular;  Laterality: N/A;   CHOLECYSTECTOMY N/A 02/09/2016   Procedure: LAPAROSCOPIC CHOLECYSTECTOMY WITH INTRAOPERATIVE  CHOLANGIOGRAM;  Surgeon: Harriette Bouillon, MD;  Location: [facility] OR;  Service: General;  Laterality: N/A;   COLONOSCOPY W/ POLYPECTOMY  08/2013   Avg risk screening, Dr Walker Kehr. 5 mm tubular adenoma ascending.  Mild to moderated descending and sigmoid tics.  Internal hemorrhoids   CORONARY ANGIOPLASTY WITH STENT PLACEMENT     ERCP N/A 02/13/2016   Procedure: ENDOSCOPIC RETROGRADE CHOLANGIOPANCREATOGRAPHY (ERCP);  Surgeon: Sherrilyn Rist, MD;  Location: [facility] ENDOSCOPY;  Service: Gastroenterology;  Laterality: N/A;   ROTATOR CUFF REPAIR Right 2009   ROTATOR CUFF REPAIR Left 2014   TRIGGER FINGER RELEASE Bilateral 2013   4 fingers, middle finger on both hands    Medications  Current Outpatient Medications on File Prior to Visit  Medication Sig Dispense Refill   Accu-Chek FastClix Lancets MISC CHECK BLOOD SUGAR ONCE OR  TWICE WEEKLY 102 each 3   acetaminophen (TYLENOL) 500 MG tablet Take 1,000 mg by mouth every 6 (six) hours as needed for headache (pain).     amLODipine (NORVASC) 5 MG tablet TAKE 1 TABLET BY MOUTH  DAILY 90 tablet 3   aspirin EC 81 MG tablet Take 81 mg by mouth daily.     atorvastatin (LIPITOR) 80 MG tablet TAKE 1 TABLET BY MOUTH AT  BEDTIME 90 tablet 3   clomiPHENE (CLOMID) 50 MG tablet Take by mouth daily.  ezetimibe (ZETIA) 10 MG tablet TAKE 1 TABLET BY MOUTH  DAILY 90 tablet 3   fenofibrate (TRICOR) 48 MG tablet TAKE 1 TABLET BY MOUTH  DAILY 90 tablet 3   glucose blood (ACCU-CHEK GUIDE) test strip Use daily to check blood sugar.  DX E11.65 100 each 3   Krill Oil 500 MG CAPS Take 500 mg by mouth 2 (two) times daily.     metFORMIN (GLUCOPHAGE-XR) 500 MG 24 hr tablet TAKE 2 TABLETS BY MOUTH DAILY  WITH BREAKFAST 180 tablet 3   metoprolol tartrate (LOPRESSOR) 25 MG tablet TAKE 1 TABLET BY MOUTH DAILY 90 tablet 3   Multiple Vitamin (MULTIVITAMIN WITH MINERALS) TABS tablet Take 1 tablet by mouth daily.     nitroGLYCERIN (NITROSTAT) 0.4 MG SL tablet Place 1 tablet (0.4 mg  total) under the tongue every 5 (five) minutes as needed for chest pain. 25 tablet 3   ramipril (ALTACE) 10 MG capsule TAKE 1 CAPSULE BY MOUTH  TWICE DAILY 180 capsule 3   tadalafil (CIALIS) 5 MG tablet Take 1 tablet (5 mg total) by mouth daily. 30 tablet 11   traZODone (DESYREL) 50 MG tablet TAKE 1 TABLET BY MOUTH AT  BEDTIME AS NEEDED FOR SLEEP 90 tablet 3   Allergies No Known Allergies  Family History Family History  Problem Relation Age of Onset   Dementia Mother    Crohn's disease Mother    Heart disease Father 21       Valve replacement and CAD, CABG   Diabetes Father    CAD Sister 38   CAD Brother 32       Died age 62   Diabetes Brother    Kidney disease Brother    Colon cancer Neg Hx    Esophageal cancer Neg Hx    Stomach cancer Neg Hx    Rectal cancer Neg Hx    Pancreatic cancer Neg Hx     Review of Systems: Constitutional:  no fevers Eye:  no recent significant change in vision Ears:  No changes in hearing Nose/Mouth/Throat:  no complaints of nasal congestion, no sore throat Cardiovascular: no chest pain Respiratory:  No shortness of breath Gastrointestinal:  No change in bowel habits GU:  No frequency Integumentary:  no abnormal skin lesions reported Neurologic:  no headaches Endocrine:  denies unexplained weight changes  Exam BP 108/68 (BP Location: Left Arm, Patient Position: Sitting, Cuff Size: Normal)   Pulse 65   Temp 98 F (36.7 C) (Oral)   Ht  (1.626 m)   Wt 170 lb 2 oz (77.2 kg)   SpO2 97%   BMI 29.20 kg/m  General:  well developed, well nourished, in no apparent distress Skin:  no significant moles, warts, or growths on exposed skin Head:  no masses, lesions, or tenderness Eyes:  pupils equal and round, sclera anicteric without injection Ears:  canals without lesions, TMs shiny without retraction, no obvious effusion, no erythema Nose:  nares patent, mucosa normal Throat/Pharynx:  lips and gingiva without lesion; tongue and uvula  midline; non-inflamed pharynx; no exudates or postnasal drainage Lungs:  clear to auscultation, breath sounds equal bilaterally, no respiratory distress Cardio:  regular rate and rhythm, no LE edema or bruits, DP pulses 3+ b/l Rectal: Deferred GI: BS+, S, NT, ND, no masses or organomegaly Musculoskeletal:  symmetrical muscle groups noted without atrophy or deformity Neuro: sensation intact to pinprick over both feet; gait slow and cautious when going up on the exam table; deep tendon reflexes  normal and symmetric 4/5 strength with knee flexion bilaterally Psych: well oriented with normal range of affect and appropriate judgment/insight  Assessment and Plan  Well adult exam  Type 2 diabetes mellitus with hyperglycemia, without long-term current use of insulin - Plan: Comprehensive metabolic panel, Lipid panel, Hemoglobin A1c, Microalbumin / creatinine urine ratio  Screening for prostate cancer - Plan: PSA, Medicare ( [place] only)  Balance problem - Plan: Ambulatory referral to Physical Therapy   Well 72 y.o. male. Counseled on diet and exercise. Other orders as above. Balance issues: Does not sound like orthostasis, hypotension, hypoglycemia, or neurologic.  He probably needs to strengthen his auxiliary muscles required for balance.  Will set him up with physical therapy. Will continue to screen for prostate cancer given his family history, dad diagnosed at 73. Follow up in 6 mo.  The patient voiced understanding and agreement to the plan.  Jilda Roche [place], DO 10/20/22 11:57 AM

## 2022-11-02 ENCOUNTER — Telehealth: Payer: Self-pay | Admitting: Family Medicine

## 2022-11-02 NOTE — Telephone Encounter (Signed)
Pt states his current referral was sent somewhere his insurance does not cover, he would like for it to be sent to [facility] physical therapy. phone: [phone]  fax:[phone]

## 2022-11-03 ENCOUNTER — Other Ambulatory Visit: Payer: Self-pay | Admitting: Family Medicine

## 2022-11-03 DIAGNOSIS — R2689 Other abnormalities of gait and mobility: Secondary | ICD-10-CM

## 2022-11-03 NOTE — Telephone Encounter (Signed)
Put in new referral for PT as requested by patient.

## 2022-11-17 ENCOUNTER — Other Ambulatory Visit: Payer: Self-pay | Admitting: Family Medicine

## 2022-11-24 ENCOUNTER — Encounter: Payer: Self-pay | Admitting: Physician Assistant

## 2022-11-24 ENCOUNTER — Ambulatory Visit: Payer: Medicare Other | Attending: Physician Assistant | Admitting: Physician Assistant

## 2022-11-24 VITALS — BP 136/68 | HR 73 | Ht 64.0 in | Wt 169.0 lb

## 2022-11-24 DIAGNOSIS — E785 Hyperlipidemia, unspecified: Secondary | ICD-10-CM | POA: Diagnosis not present

## 2022-11-24 DIAGNOSIS — R1030 Lower abdominal pain, unspecified: Secondary | ICD-10-CM

## 2022-11-24 DIAGNOSIS — I10 Essential (primary) hypertension: Secondary | ICD-10-CM | POA: Diagnosis not present

## 2022-11-24 DIAGNOSIS — Z7984 Long term (current) use of oral hypoglycemic drugs: Secondary | ICD-10-CM

## 2022-11-24 DIAGNOSIS — E119 Type 2 diabetes mellitus without complications: Secondary | ICD-10-CM | POA: Diagnosis not present

## 2022-11-24 DIAGNOSIS — I2510 Atherosclerotic heart disease of native coronary artery without angina pectoris: Secondary | ICD-10-CM | POA: Diagnosis not present

## 2022-11-24 MED ORDER — METOPROLOL TARTRATE 25 MG PO TABS: 25.0000 mg | ORAL_TABLET | Freq: Two times a day (BID) | ORAL | 3 refills | Status: DC

## 2022-11-24 NOTE — Patient Instructions (Addendum)
Medication Instructions:   INCREASE Metoprolol Tartrate (Lopressor) to 25 mg 2 times a day   *If you need a refill on your cardiac medications before your next appointment, please call your pharmacy*  Lab Work: NONE ordered at this time of appointment   If you have labs (blood work) drawn today and your tests are completely normal, you will receive your results only by: MyChart Message (if you have MyChart) OR A paper copy in the mail If you have any lab test that is abnormal or we need to change your treatment, we will call you to review the results.  Testing/Procedures: NONE ordered at this time of appointment   Follow-Up: At [facility], you and your health needs are our priority.  As part of our continuing mission to provide you with exceptional heart care, we have created designated Provider Care Teams.  These Care Teams include your primary Cardiologist (physician) and Advanced Practice Providers (APPs -  Physician Assistants and Nurse Practitioners) who all work together to provide you with the care you need, when you need it.  Your next appointment:   1 year(s)  Provider:   Olga Millers, MD     Other Instructions

## 2022-11-24 NOTE — Progress Notes (Signed)
Cardiology Office Note:    Date:  11/26/2022   ID:  Troy Boyer, DOB 29-Feb-1951, MRN [id number]  PCP:  Sharlene Dory, DO    HeartCare Providers Cardiologist:  Olga Millers, MD     Referring MD: Sharlene Dory*   Chief Complaint  Patient presents with   Follow-up    Seen for Dr. Jens Som    History of Present Illness:    Troy Boyer is a 72 y.o. male with a hx of CAD, hypertension, hyperlipidemia and DM2.  Patient had NSTEMI in 2002.  Cardiac catheterization at the time revealed 80% proximal RCA followed by 85% lesion, 90% left circumflex lesion, normal EF.  He underwent PCI of left circumflex and RCA.  Myoview in July 2012 showed EF 76%, no ischemia.  He was readmitted in July 2016 with NSTEMI, cardiac catheterization demonstrated 95% proximal left circumflex in-stent restenosis treated with DES, he had 40% mid LAD, 25% in-stent restenosis in proximal RCA and a 55% mid RCA lesion that were treated medically.  EF was preserved.  Repeat cardiac catheterization in August 2017 revealed moderate CAD without significant obstruction, medical therapy was recommended.  Past head CT showed possible normal pressure hydrocephalus, this was reviewed by neurology service who did not think further therapy was indicated.  Abdominal ultrasound in November 2018 showed ectatic aorta measuring 2.6 cm, follow-up recommended in 5-years.  He was last seen by Dr. Jens Som in January 2023 at which time he was doing well.  AAA Doppler obtained in November 2023 showed largest aortic measurement that was 2.4 cm, no evidence of AAA.  Lung cancer screening CT obtained on 07/20/2022 demonstrated mild diffuse bronchial wall thickening was very mild central lobar and paraseptal emphysema consistent with underlying COPD, mild calcification of the aortic valve and a severe calcification of the mitral annulus.  Most recent echocardiogram obtained on 08/16/2022 demonstrated EF 60 to 65%, grade 1  DD, normal RV, mild MR.  Most recent hemoglobin A1c obtained in April 2024 was 8.0, up from the previous 7.6 last year.  Patient presents today for 1 year follow-up.  He denies any recent exertional chest pain or worsening dyspnea.  For the past 6 months, he has been having bilateral groin tightness after walking certain distance.  I have reviewed his AAA arterial Doppler with our vascular specialist, there is no sign of significant aortoiliac disease to explain the bilateral groin pain with ambulation.  Therefore, I suspect the groin pain with ambulation is related to arthritis.  His blood pressure is borderline elevated today, he started taking metoprolol tartrate once a day, his metoprolol tartrate was prescribed as a daily dosing.  I will increase metoprolol to tartrate to 25 mg twice a day.  He can follow-up with Dr. Jens Som in 1 year.   Past Medical History:  Diagnosis Date   Adenomatous colon polyp    tubular   CAD (coronary artery disease)    a. Cath 06/08/01 at [facility] with normal LM and LAD, 95% LCx s/p Circumflex stent 4.0 x 15 mm Penta and 85% and 80% prox RCA s/p 3.5 x 23 mm Penta b. cath 01/20/2015 95% prox LCx ISR treated with DES, 55% prox to mid RCA, 45% distal RCA, 40% midLAD, EF 55%     Cataract    Diabetes mellitus without complication (HCC)    Diverticulosis    Erectile dysfunction    Gallstones    Hyperlipidemia    Hypertension    Myocardial infarction (HCC)  Past Surgical History:  Procedure Laterality Date   ANGIOPLASTY  2002   2 stents   CARDIAC CATHETERIZATION N/A 01/20/2015   Procedure: Left Heart Cath and Coronary Angiography;  Surgeon: Lyn Records, MD;  Location: [facility] INVASIVE CV LAB;  Service: Cardiovascular;  Laterality: N/A;   CARDIAC CATHETERIZATION N/A 01/27/2016   Procedure: Left Heart Cath and Coronary Angiography;  Surgeon: Tonny Bollman, MD;  Location: [facility] INVASIVE CV LAB;  Service: Cardiovascular;  Laterality: N/A;   CHOLECYSTECTOMY N/A 02/09/2016    Procedure: LAPAROSCOPIC CHOLECYSTECTOMY WITH INTRAOPERATIVE CHOLANGIOGRAM;  Surgeon: Harriette Bouillon, MD;  Location: [facility] OR;  Service: General;  Laterality: N/A;   COLONOSCOPY W/ POLYPECTOMY  08/2013   Avg risk screening, Dr Walker Kehr. 5 mm tubular adenoma ascending.  Mild to moderated descending and sigmoid tics.  Internal hemorrhoids   CORONARY ANGIOPLASTY WITH STENT PLACEMENT     ERCP N/A 02/13/2016   Procedure: ENDOSCOPIC RETROGRADE CHOLANGIOPANCREATOGRAPHY (ERCP);  Surgeon: Sherrilyn Rist, MD;  Location: [facility] ENDOSCOPY;  Service: Gastroenterology;  Laterality: N/A;   ROTATOR CUFF REPAIR Right 2009   ROTATOR CUFF REPAIR Left 2014   TRIGGER FINGER RELEASE Bilateral 2013   4 fingers, middle finger on both hands    Current Medications: Current Meds  Medication Sig   Accu-Chek FastClix Lancets MISC CHECK BLOOD SUGAR ONCE OR  TWICE WEEKLY   acetaminophen (TYLENOL) 500 MG tablet Take 1,000 mg by mouth every 6 (six) hours as needed for headache (pain).   amLODipine (NORVASC) 5 MG tablet TAKE 1 TABLET BY MOUTH  DAILY   aspirin EC 81 MG tablet Take 81 mg by mouth daily.   atorvastatin (LIPITOR) 80 MG tablet TAKE 1 TABLET BY MOUTH AT  BEDTIME   clomiPHENE (CLOMID) 50 MG tablet Take by mouth daily.   ezetimibe (ZETIA) 10 MG tablet TAKE 1 TABLET BY MOUTH  DAILY   fenofibrate (TRICOR) 48 MG tablet TAKE 1 TABLET BY MOUTH DAILY   glucose blood (ACCU-CHEK GUIDE) test strip Use daily to check blood sugar.  DX E11.65   Krill Oil 500 MG CAPS Take 500 mg by mouth 2 (two) times daily.   metFORMIN (GLUCOPHAGE-XR) 500 MG 24 hr tablet Take 2 tablets (1,000 mg total) by mouth 2 (two) times daily with a meal.   Multiple Vitamin (MULTIVITAMIN WITH MINERALS) TABS tablet Take 1 tablet by mouth daily.   nitroGLYCERIN (NITROSTAT) 0.4 MG SL tablet Place 1 tablet (0.4 mg total) under the tongue every 5 (five) minutes as needed for chest pain.   ramipril (ALTACE) 10 MG capsule TAKE 1 CAPSULE BY MOUTH  TWICE DAILY    tadalafil (CIALIS) 5 MG tablet Take 1 tablet (5 mg total) by mouth daily.   traZODone (DESYREL) 50 MG tablet TAKE 1 TABLET BY MOUTH AT  BEDTIME AS NEEDED FOR SLEEP   [DISCONTINUED] metoprolol tartrate (LOPRESSOR) 25 MG tablet TAKE 1 TABLET BY MOUTH DAILY     Allergies:   Patient has no known allergies.   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    Social Determinants of Health   Financial Resource Strain: Low Risk  (10/13/2022)   Overall Financial Resource Strain (CARDIA)    Difficulty of Paying Living Expenses: Not very hard  Food Insecurity: No Food Insecurity (10/13/2022)   Hunger Vital Sign    Worried About Running Out of Food in the Last Year: Never true    Ran Out of Food in the Last Year: Never true  Transportation Needs: No Transportation Needs (10/13/2022)   PRAPARE - Administrator, Civil Service (Medical): No    Lack of Transportation (Non-Medical): No  Physical Activity: Insufficiently Active (10/13/2022)   Exercise Vital Sign    Days of Exercise per Week: 1 day    Minutes of Exercise per Session: 90 min  Stress:  No Stress Concern Present (10/13/2022)   [employer] of Occupational Health - Occupational Stress Questionnaire    Feeling of Stress : Only a little  Social Connections: Unknown (10/13/2022)   Social Connection and Isolation Panel [NHANES]    Frequency of Communication with Friends and Family: More than three times a week    Frequency of Social Gatherings with Friends and Family: Once a week    Attends Religious Services: Patient declined    Database administrator or Organizations: Yes    Attends Engineer, structural: More than 4 times per year    Marital Status: Married     Family History: The patient's family history includes CAD (age of onset: 25) in his brother; CAD (age of onset: 10) in his sister; Crohn's disease in his mother; Dementia in his mother; Diabetes in his brother and father; Heart disease (age of onset: 21) in his father; Kidney disease in his brother. There is no history of Colon cancer, Esophageal cancer, Stomach cancer, Rectal cancer, or Pancreatic cancer.  ROS:   Please see the history of present illness.     All other systems reviewed and are negative.  EKGs/Labs/Other Studies Reviewed:    The following studies were reviewed today:  Echo 08/16/2022  1. Left ventricular ejection fraction, by estimation, is 60 to 65%. The  left ventricle has normal function. The left ventricle has no regional  wall motion abnormalities. Left ventricular diastolic parameters are  consistent with Grade I diastolic  dysfunction (impaired relaxation). Elevated left atrial pressure.   2. Right ventricular systolic function is normal. The right ventricular  size is normal.   3. Left atrial size was mildly dilated.   4. MV is thickened. Peak and mean gradients through the valve are 11 and  5 mm Hg respectively.(HR 65 bpm) MVA (VTI) is 1.89 cm2 consistent with  mild MS. Mild mitral valve regurgitation. Moderate mitral annular  calcification.   5. The aortic valve is  tricuspid. Aortic valve regurgitation is not  visualized. Aortic valve sclerosis/calcification is present, without any  evidence of aortic stenosis.   6. The inferior  vena cava is normal in size with <50% respiratory  variability, suggesting right atrial pressure of 8 mmHg.    EKG:  EKG is ordered today.  The ekg ordered today demonstrates normal sinus rhythm, no significant ST-T wave changes.  Recent Labs: 04/20/2022: TSH 1.55 10/20/2022: ALT 22; BUN 14; Creatinine, Ser 0.92; Potassium 4.3; Sodium 142  Recent Lipid Panel    Component Value Date/Time   CHOL 92 10/20/2022 1105   CHOL 113 05/28/2020 1056   TRIG 130.0 10/20/2022 1105   HDL 31.30 (L) 10/20/2022 1105   HDL 33 (L) 05/28/2020 1056   CHOLHDL 3 10/20/2022 1105   VLDL 26.0 10/20/2022 1105   LDLCALC 35 10/20/2022 1105   LDLCALC 54 05/28/2020 1056   LDLDIRECT 74.0 05/18/2019 1053     Risk Assessment/Calculations:           Physical Exam:    VS:  BP 136/68   Pulse 73   Ht 5\' 4"  (1.626 m)   Wt 169 lb (76.7 kg)   SpO2 98%   BMI 29.01 kg/m        Wt Readings from Last 3 Encounters:  11/24/22 169 lb (76.7 kg)  10/20/22 170 lb 2 oz (77.2 kg)  06/11/22 170 lb 6 oz (77.3 kg)     GEN:  Well nourished, well developed in no acute distress HEENT: Normal NECK: No JVD; No carotid bruits LYMPHATICS: No lymphadenopathy CARDIAC: RRR, no murmurs, rubs, gallops RESPIRATORY:  Clear to auscultation without rales, wheezing or rhonchi  ABDOMEN: Soft, non-tender, non-distended MUSCULOSKELETAL:  No edema; No deformity  SKIN: Warm and dry NEUROLOGIC:  Alert and oriented x 3 PSYCHIATRIC:  Normal affect   ASSESSMENT:    1. Coronary artery disease involving native coronary artery of native heart without angina pectoris   2. Essential hypertension   3. Hyperlipidemia LDL goal <70   4. Controlled type 2 diabetes mellitus without complication, without long-term current use of insulin (HCC)   5. Inguinal pain, unspecified  laterality    PLAN:    In order of problems listed above:  CAD: Denies any recent chest pain.  Continue aspirin and Lipitor  Hypertension: Blood pressure borderline elevated, he has been taking metoprolol tartrate once a day instead of twice a day, I will increase metoprolol to tartrate to 25 mg twice a day  Hyperlipidemia: On Lipitor 80 mg daily  DM2: Managed by primary care provider  Bilateral groin pain: Occurs with ambulation, previous AAA Doppler did not reveal any significant aortoiliac disease.  Suspect arthritis as the main culprit.           Medication Adjustments/Labs and Tests Ordered: Current medicines are reviewed at length with the patient today.  Concerns regarding medicines are outlined above.  Orders Placed This Encounter  Procedures   EKG 12-Lead   Meds ordered this encounter  Medications   metoprolol tartrate (LOPRESSOR) 25 MG tablet    Sig: Take 1 tablet (25 mg total) by mouth 2 (two) times daily.    Dispense:  180 tablet    Refill:  3    Please send a replace/new response with 90-Day Supply if appropriate to maximize member benefit. Requesting 1 year supply.    Patient Instructions  Medication Instructions:   INCREASE Metoprolol Tartrate (Lopressor) to 25 mg 2 times a day   *If you need a refill on your cardiac medications before your next appointment, please call your pharmacy*  Lab Work: NONE ordered at this time of appointment   If  you have labs (blood work) drawn today and your tests are completely normal, you will receive your results only by: MyChart Message (if you have MyChart) OR A paper copy in the mail If you have any lab test that is abnormal or we need to change your treatment, we will call you to review the results.  Testing/Procedures: NONE ordered at this time of appointment   Follow-Up: At [facility], you and your health needs are our priority.  As part of our continuing mission to provide you with exceptional  heart care, we have created designated Provider Care Teams.  These Care Teams include your primary Cardiologist (physician) and Advanced Practice Providers (APPs -  Physician Assistants and Nurse Practitioners) who all work together to provide you with the care you need, when you need it.  Your next appointment:   1 year(s)  Provider:   Olga Millers, MD     Other Instructions     Signed, Azalee Course, PA  11/26/2022 10:26 PM    [facility]

## 2022-11-26 ENCOUNTER — Encounter: Payer: Self-pay | Admitting: Physician Assistant

## 2022-11-29 DIAGNOSIS — R2681 Unsteadiness on feet: Secondary | ICD-10-CM | POA: Diagnosis not present

## 2022-12-01 ENCOUNTER — Encounter: Payer: Self-pay | Admitting: Family Medicine

## 2022-12-01 ENCOUNTER — Other Ambulatory Visit: Payer: Self-pay | Admitting: Family Medicine

## 2022-12-01 ENCOUNTER — Other Ambulatory Visit (INDEPENDENT_AMBULATORY_CARE_PROVIDER_SITE_OTHER): Payer: Medicare Other

## 2022-12-01 DIAGNOSIS — R972 Elevated prostate specific antigen [PSA]: Secondary | ICD-10-CM

## 2022-12-01 LAB — PSA: PSA: 3.15 ng/mL (ref 0.10–4.00)

## 2022-12-02 DIAGNOSIS — R2681 Unsteadiness on feet: Secondary | ICD-10-CM | POA: Diagnosis not present

## 2022-12-05 ENCOUNTER — Other Ambulatory Visit: Payer: Self-pay | Admitting: Cardiology

## 2022-12-06 DIAGNOSIS — R2681 Unsteadiness on feet: Secondary | ICD-10-CM | POA: Diagnosis not present

## 2022-12-09 DIAGNOSIS — R2681 Unsteadiness on feet: Secondary | ICD-10-CM | POA: Diagnosis not present

## 2022-12-13 DIAGNOSIS — R2681 Unsteadiness on feet: Secondary | ICD-10-CM | POA: Diagnosis not present

## 2022-12-16 DIAGNOSIS — R350 Frequency of micturition: Secondary | ICD-10-CM | POA: Diagnosis not present

## 2022-12-17 DIAGNOSIS — R2681 Unsteadiness on feet: Secondary | ICD-10-CM | POA: Diagnosis not present

## 2022-12-20 DIAGNOSIS — R2681 Unsteadiness on feet: Secondary | ICD-10-CM | POA: Diagnosis not present

## 2022-12-23 ENCOUNTER — Encounter: Payer: Self-pay | Admitting: Family Medicine

## 2022-12-23 DIAGNOSIS — R2681 Unsteadiness on feet: Secondary | ICD-10-CM | POA: Diagnosis not present

## 2022-12-28 DIAGNOSIS — R2681 Unsteadiness on feet: Secondary | ICD-10-CM | POA: Diagnosis not present

## 2022-12-29 DIAGNOSIS — R2681 Unsteadiness on feet: Secondary | ICD-10-CM | POA: Diagnosis not present

## 2022-12-30 ENCOUNTER — Other Ambulatory Visit: Payer: Self-pay | Admitting: Family Medicine

## 2022-12-30 DIAGNOSIS — I10 Essential (primary) hypertension: Secondary | ICD-10-CM

## 2022-12-30 DIAGNOSIS — I2510 Atherosclerotic heart disease of native coronary artery without angina pectoris: Secondary | ICD-10-CM

## 2023-01-03 DIAGNOSIS — R2681 Unsteadiness on feet: Secondary | ICD-10-CM | POA: Diagnosis not present

## 2023-01-06 DIAGNOSIS — R2681 Unsteadiness on feet: Secondary | ICD-10-CM | POA: Diagnosis not present

## 2023-01-10 DIAGNOSIS — R2681 Unsteadiness on feet: Secondary | ICD-10-CM | POA: Diagnosis not present

## 2023-01-13 DIAGNOSIS — R2681 Unsteadiness on feet: Secondary | ICD-10-CM | POA: Diagnosis not present

## 2023-01-17 DIAGNOSIS — R2681 Unsteadiness on feet: Secondary | ICD-10-CM | POA: Diagnosis not present

## 2023-01-26 ENCOUNTER — Telehealth: Payer: Self-pay

## 2023-01-26 ENCOUNTER — Telehealth: Payer: Self-pay | Admitting: Family Medicine

## 2023-01-26 ENCOUNTER — Encounter: Payer: Self-pay | Admitting: Family Medicine

## 2023-01-26 ENCOUNTER — Other Ambulatory Visit: Payer: Self-pay | Admitting: Family Medicine

## 2023-01-26 ENCOUNTER — Ambulatory Visit (INDEPENDENT_AMBULATORY_CARE_PROVIDER_SITE_OTHER): Payer: Medicare Other | Admitting: Family Medicine

## 2023-01-26 VITALS — BP 118/68 | HR 63 | Temp 98.0°F | Ht 64.0 in | Wt 166.2 lb

## 2023-01-26 DIAGNOSIS — Z7984 Long term (current) use of oral hypoglycemic drugs: Secondary | ICD-10-CM

## 2023-01-26 DIAGNOSIS — R413 Other amnesia: Secondary | ICD-10-CM | POA: Diagnosis not present

## 2023-01-26 DIAGNOSIS — E1165 Type 2 diabetes mellitus with hyperglycemia: Secondary | ICD-10-CM

## 2023-01-26 DIAGNOSIS — W19XXXA Unspecified fall, initial encounter: Secondary | ICD-10-CM

## 2023-01-26 LAB — HEMOGLOBIN A1C: Hgb A1c MFr Bld: 7.8 % — ABNORMAL HIGH (ref 4.6–6.5)

## 2023-01-26 MED ORDER — PIOGLITAZONE HCL 30 MG PO TABS: 30.0000 mg | ORAL_TABLET | Freq: Every day | ORAL | 3 refills | Status: DC

## 2023-01-26 MED ORDER — FREESTYLE LIBRE 3 SENSOR MISC
1 refills | Status: DC
Start: 2023-01-26 — End: 2023-11-18

## 2023-01-26 NOTE — Telephone Encounter (Signed)
Medicare will not cover continuous blood glucometers for type 2 diabetes who are not on multiple insulin doses daily. PA not sent to plan.

## 2023-01-26 NOTE — Patient Instructions (Addendum)
Give [place] 2-3 business days to get the results of your labs back.   Keep the diet clean and stay active.  Remember that you promised me to go to the gym 3 times per week, or more.   If you do not hear anything about your referral in the next 1-2 weeks, call our office and ask for an update.  Let us know if you need anything.

## 2023-01-26 NOTE — Telephone Encounter (Signed)
Pt called back and stated he was advise to speak with Robin. Please advise pt.

## 2023-01-26 NOTE — Progress Notes (Signed)
Subjective:   Chief Complaint  Patient presents with   Follow-up    DM    Fall    Has had some recent falls     Troy Boyer is a 72 y.o. male here for follow-up of diabetes.  Here w wife.  Troy Boyer's self monitored glucose range is low-mid 100's.  Patient denies hypoglycemic reactions. He checks his glucose levels 1 time(s) per day. Patient does not require insulin.   Medications include: metformin XR 1000 mg bid Diet could be better. Exercise: going back to the gym No CP or SOB.   Several falls in last few weeks. One was because he was careless with his ladder. Another was because his shoes were too big and another was because his shoes were too old/lacked grip. He has had no issues since then. Concern about his memory remains a problem. CT scan from several yrs ago did not show any atrophy. Metabolic workup neg.   Past Medical History:  Diagnosis Date   Adenomatous colon polyp    tubular   CAD (coronary artery disease)    a. Cath 06/08/01 at [facility] with normal LM and LAD, 95% LCx s/p Circumflex stent 4.0 x 15 mm Penta and 85% and 80% prox RCA s/p 3.5 x 23 mm Penta b. cath 01/20/2015 95% prox LCx ISR treated with DES, 55% prox to mid RCA, 45% distal RCA, 40% midLAD, EF 55%     Cataract    Diabetes mellitus without complication (HCC)    Diverticulosis    Erectile dysfunction    Gallstones    Hyperlipidemia    Hypertension    Myocardial infarction (HCC)      Related testing: Retinal exam: Done Pneumovax: done  Objective:  BP 118/68 (BP Location: Right Arm, Patient Position: Sitting, Cuff Size: Normal)   Pulse 63   Temp 98 F (36.7 C) (Oral)   Ht 5\' 4"  (1.626 m)   Wt 166 lb 4 oz (75.4 kg)   SpO2 97%   BMI 28.54 kg/m  General:  Well developed, well nourished, in no apparent distress Lungs:  CTAB, no access msc use Cardio:  RRR, no bruits, no LE edema Psych: Age appropriate judgment and insight  Assessment:   Type 2 diabetes mellitus with hyperglycemia,  without long-term current use of insulin (HCC) - Plan: Hemoglobin A1c, Continuous Glucose Sensor (FREESTYLE LIBRE 3 SENSOR) MISC  Fall, initial encounter  Memory changes - Plan: Ambulatory referral to Neurology   Plan:   Chronic, unstable. Counseled on diet and exercise. Newer issues 2/2 carelessness w a ladder and too large of shoes for the other 2 falls. If continuing to have issue, will have him come back for more thorough evaluation. Neither wife nor pt as concerned at this time.  Chronic issue. Wife more concerned that pt is. Will refer as above. Metabolic workup neg historically. Could consider updated imaging pending timeframe to appt.  F/u in 3-6 mo. The patient and his wife voiced understanding and agreement to the plan.  Jilda Roche [place], DO 01/26/23 12:54 PM

## 2023-01-27 NOTE — Telephone Encounter (Signed)
See result notes. 

## 2023-01-27 NOTE — Telephone Encounter (Signed)
Patient informed. 

## 2023-04-21 ENCOUNTER — Telehealth: Payer: Self-pay | Admitting: Gastroenterology

## 2023-04-21 NOTE — Telephone Encounter (Signed)
Inbound call from patient stating he has been having trouble controlling his bowel movements. Patient requesting a call to discuss further. Please advise, thank you.

## 2023-04-22 NOTE — Telephone Encounter (Signed)
Patient called again today asking for someone to please respond to his inquiry.  He states he cannot control his bowel movements and would like some advice as to what to do.  He states he would be willing to see anyone, but did not feel he could wait until February.  Please call patient and advise.  Thank you.

## 2023-04-22 NOTE — Telephone Encounter (Signed)
Patient states that he has frequent diarrhea and has had several episodes of fecal incontinence recently. He denies any blood in the stool, no abdominal pain, no upper GI symptoms. States he has been having diarrhea like this x 4 years since his cholecystectomy. However, fecal incontinence episodes are newer.   Appointment scheduled with Mike Gip, PA-C 05/02/23 at 830 am. Patient verbalizes understanding.

## 2023-04-25 ENCOUNTER — Encounter: Payer: Self-pay | Admitting: Family Medicine

## 2023-04-25 ENCOUNTER — Ambulatory Visit (INDEPENDENT_AMBULATORY_CARE_PROVIDER_SITE_OTHER): Payer: Medicare Other | Admitting: Family Medicine

## 2023-04-25 ENCOUNTER — Ambulatory Visit: Payer: Medicare Other | Admitting: Family Medicine

## 2023-04-25 VITALS — BP 124/72 | HR 68 | Temp 98.0°F | Resp 16 | Ht 64.0 in | Wt 178.0 lb

## 2023-04-25 DIAGNOSIS — E782 Mixed hyperlipidemia: Secondary | ICD-10-CM | POA: Diagnosis not present

## 2023-04-25 DIAGNOSIS — E1165 Type 2 diabetes mellitus with hyperglycemia: Secondary | ICD-10-CM | POA: Diagnosis not present

## 2023-04-25 DIAGNOSIS — I10 Essential (primary) hypertension: Secondary | ICD-10-CM

## 2023-04-25 DIAGNOSIS — Z23 Encounter for immunization: Secondary | ICD-10-CM

## 2023-04-25 DIAGNOSIS — Z7984 Long term (current) use of oral hypoglycemic drugs: Secondary | ICD-10-CM | POA: Diagnosis not present

## 2023-04-25 LAB — LIPID PANEL
Cholesterol: 107 mg/dL (ref 0–200)
HDL: 44.4 mg/dL (ref >39.00)
LDL Cholesterol: 40 mg/dL (ref 0–99)
NonHDL: 62.13
Total CHOL/HDL Ratio: 2
Triglycerides: 109 mg/dL (ref 0.0–149.0)
VLDL: 21.8 mg/dL (ref 0.0–40.0)

## 2023-04-25 LAB — COMPREHENSIVE METABOLIC PANEL
ALT: 14 U/L (ref 0–53)
AST: 14 U/L (ref 0–37)
Albumin: 4.2 g/dL (ref 3.5–5.2)
Alkaline Phosphatase: 24 U/L — ABNORMAL LOW (ref 39–117)
BUN: 16 mg/dL (ref 6–23)
CO2: 28 meq/L (ref 19–32)
Calcium: 9.1 mg/dL (ref 8.4–10.5)
Chloride: 105 meq/L (ref 96–112)
Creatinine, Ser: 0.94 mg/dL (ref 0.40–1.50)
GFR: 81.8 mL/min (ref >60.00)
Glucose, Bld: 103 mg/dL — ABNORMAL HIGH (ref 70–99)
Potassium: 4 meq/L (ref 3.5–5.1)
Sodium: 141 meq/L (ref 135–145)
Total Bilirubin: 0.5 mg/dL (ref 0.2–1.2)
Total Protein: 6.1 g/dL (ref 6.0–8.3)

## 2023-04-25 LAB — HEMOGLOBIN A1C: Hgb A1c MFr Bld: 7.1 % — ABNORMAL HIGH (ref 4.6–6.5)

## 2023-04-25 MED ORDER — ACCU-CHEK GUIDE VI STRP: ORAL_STRIP | 3 refills | Status: AC

## 2023-04-25 MED ORDER — ACCU-CHEK FASTCLIX LANCETS MISC: 3 refills | Status: DC

## 2023-04-25 NOTE — Progress Notes (Signed)
Subjective:   Chief Complaint  Patient presents with   Follow-up    Follow up    Troy Boyer is a 72 y.o. male here for follow-up of diabetes.   [name]'s self monitored glucose range is low 100's.  Patient denies hypoglycemic reactions. He checks his glucose levels 3 time(s) per week. Patient does not require insulin.   Medications include: Metformin XR 1000 mg twice daily, Actos 30 mg daily Diet is healthy.  Exercise: lifting wts, cardio  Hypertension Patient presents for hypertension follow up. He does not routinely monitor home blood pressures. He is compliant with medications-Altace 10 mg daily, Norvasc 5 mg daily, Lopressor 25 mg twice daily. Patient has these side effects of medication: none Diet/exercise as above. No chest pain or shortness of breath  Hyperlipidemia Patient presents for mixed hyperlipidemia follow up. Currently being treated with Lipitor 80 mg daily, fenofibrate 48 mg daily, Zetia 10 mg daily and compliance with treatment thus far has been good. He denies myalgias. Diet/exercise as above The patient is not known to have coexisting coronary artery disease.  Past Medical History:  Diagnosis Date   Adenomatous colon polyp    tubular   CAD (coronary artery disease)    a. Cath 06/08/01 at [facility] with normal LM and LAD, 95% LCx s/p Circumflex stent 4.0 x 15 mm Penta and 85% and 80% prox RCA s/p 3.5 x 23 mm Penta b. cath 01/20/2015 95% prox LCx ISR treated with DES, 55% prox to mid RCA, 45% distal RCA, 40% midLAD, EF 55%     Cataract    Diabetes mellitus without complication (HCC)    Diverticulosis    Erectile dysfunction    Gallstones    Hyperlipidemia    Hypertension    Myocardial infarction (HCC)      Related testing: Retinal exam: Done Pneumovax: done  Objective:  BP 124/72 (BP Location: Left Arm, Patient Position: Sitting, Cuff Size: Normal)   Pulse 68   Temp 98 F (36.7 C) (Oral)   Resp 16   Ht 5\' 4"  (1.626 m)   Wt 178 lb (80.7  kg)   SpO2 98%   BMI 30.55 kg/m  General:  Well developed, well nourished, in no apparent distress Lungs:  CTAB, no access msc use Cardio:  RRR, no bruits, 1+ pitting b/l LE edema tapering at mid tibia Psych: Age appropriate judgment and insight  Assessment:   Type 2 diabetes mellitus with hyperglycemia, without long-term current use of insulin (HCC) - Plan: Comprehensive metabolic panel, Hemoglobin A1c, Lipid panel  Essential hypertension  Mixed hyperlipidemia   Plan:   Chronic, hopefully stable.  Continue metformin XR 1000 mg twice daily, Actos 30 mg daily.  Counseled on diet and exercise. Chronic, stable.  Continue Altace 10 mg daily, Norvasc 5 mg daily. Chronic, stable.  Continue fenofibrate 48 mg daily, Zetia 10 mg daily, Lipitor 80 mg daily. Flu shot today. F/u in 6 mo. The patient voiced understanding and agreement to the plan.  Jilda Roche [place], DO 04/25/23 12:48 PM

## 2023-04-25 NOTE — Addendum Note (Signed)
Addended by: Kathi Ludwig on: 04/25/2023 12:56 PM   Modules accepted: Orders

## 2023-04-25 NOTE — Patient Instructions (Addendum)
Give [place] 2-3 business days to get the results of your labs back.   Keep the diet clean and stay active.  Try to drink 55-60 oz of water daily outside of exercise.  Let us know if you need anything.

## 2023-05-02 ENCOUNTER — Encounter: Payer: Self-pay | Admitting: Physician Assistant

## 2023-05-02 ENCOUNTER — Ambulatory Visit: Payer: Medicare Other | Admitting: Physician Assistant

## 2023-05-02 VITALS — BP 118/72 | HR 67 | Ht 64.0 in | Wt 197.1 lb

## 2023-05-02 DIAGNOSIS — K529 Noninfective gastroenteritis and colitis, unspecified: Secondary | ICD-10-CM

## 2023-05-02 NOTE — Progress Notes (Signed)
Subjective:    Patient ID: Troy Boyer, male    DOB: 1950-08-01, 72 y.o.   MRN: [id number]  HPI  Troy Boyer is a pleasant 72 year old male, established with Dr. Adela Lank.  He was last seen in August 2022 when he underwent colonoscopy and endoscopy.  At that time he did have complaints of diarrhea.  Colonoscopy revealed multiple diverticuli, he had two 3 mm ascending colon polyps removed and internal hemorrhoids.  Biopsies of the polyps proven to be tubular adenomas and random biopsies were done because of complaints of diarrhea and these were unremarkable. EGD at that time with grade a esophagitis and biopsies showed changes consistent with reflux. Patient had undergone cholecystectomy in 2018.  On careful questioning today he says he does not recall diarrhea starting immediately after the cholecystectomy and really has noticed more problems with diarrhea over the past 3 to 4 years He feels that this has been getting worse and he has been having frequent accidents over the past year. Interestingly he says most days he has an urgent need for bowel movement as soon as he wakes up in the morning.  He has to go straight to the bathroom and sometimes will have accidents because he cannot control it.  This is before he eats or drinks anything.  He says all of his stools are loose to liquid but he usually only has 1-2 bowel movements per day.  He does feel that sometimes the diarrhea is triggered by eating for the second bowel movement in the day but not always.  Not noticed any melena or hematochezia, he does not have any complaints of abdominal pain or cramping. He had been offered a trial of colestipol in 2022 but at that time declined because he said he had been already taking too many medications. He does have some component of lactose intolerance and uses fair life milk and avoids ice cream etc. He is diabetic and at some point within the past year or 2 he had an increase in dose of metformin he is  currently on 1000 mg twice daily and says that he takes the nighttime dose right before he goes to bed.  Other medical problems include coronary artery disease status post prior MI, hypertension, abdominal aortic aneurysm, diabetes mellitus.  Review of Systems Pertinent positive and negative review of systems were noted in the above HPI section.  All other review of systems was otherwise negative.   Outpatient Encounter Medications as of 05/02/2023  Medication Sig   MYRBETRIQ 50 MG TB24 tablet Take 50 mg by mouth daily.   Accu-Chek FastClix Lancets MISC CHECK BLOOD SUGAR ONCE OR  TWICE WEEKLY   acetaminophen (TYLENOL) 500 MG tablet Take 1,000 mg by mouth every 6 (six) hours as needed for headache (pain).   amLODipine (NORVASC) 5 MG tablet TAKE 1 TABLET BY MOUTH DAILY   aspirin EC 81 MG tablet Take 81 mg by mouth daily.   atorvastatin (LIPITOR) 80 MG tablet TAKE 1 TABLET BY MOUTH AT  BEDTIME   clomiPHENE (CLOMID) 50 MG tablet Take by mouth daily.   Continuous Glucose Sensor (FREESTYLE LIBRE 3 SENSOR) MISC Use to check blood sugar (Patient not taking: Reported on 05/02/2023)   ezetimibe (ZETIA) 10 MG tablet TAKE 1 TABLET BY MOUTH  DAILY   fenofibrate (TRICOR) 48 MG tablet TAKE 1 TABLET BY MOUTH DAILY   glucose blood (ACCU-CHEK GUIDE) test strip Use daily to check blood sugar.  DX E11.65   Krill Oil 500 MG CAPS Take  500 mg by mouth 2 (two) times daily.   metFORMIN (GLUCOPHAGE-XR) 500 MG 24 hr tablet Take 2 tablets (1,000 mg total) by mouth 2 (two) times daily with a meal.   metoprolol tartrate (LOPRESSOR) 25 MG tablet Take 1 tablet (25 mg total) by mouth 2 (two) times daily.   Multiple Vitamin (MULTIVITAMIN WITH MINERALS) TABS tablet Take 1 tablet by mouth daily.   nitroGLYCERIN (NITROSTAT) 0.4 MG SL tablet Place 1 tablet (0.4 mg total) under the tongue every 5 (five) minutes as needed for chest pain.   pioglitazone (ACTOS) 30 MG tablet Take 1 tablet (30 mg total) by mouth daily.   ramipril  (ALTACE) 10 MG capsule TAKE 1 CAPSULE BY MOUTH TWICE  DAILY   tadalafil (CIALIS) 5 MG tablet Take 1 tablet (5 mg total) by mouth daily.   traZODone (DESYREL) 50 MG tablet TAKE 1 TABLET BY MOUTH AT  BEDTIME AS NEEDED FOR SLEEP   No facility-administered encounter medications on file as of 05/02/2023.   No Known Allergies Patient Active Problem List   Diagnosis Date Noted   Dysphagia 01/08/2021   History of colon polyps 01/08/2021   Intermittent diarrhea 01/08/2021   AAA (abdominal aortic aneurysm) without rupture (HCC) 12/09/2020   Urge incontinence 03/15/2019   Insomnia 11/10/2018   Choledocholithiasis with acute cholecystitis    LFT elevation    Acute cholecystitis 02/07/2016   RUQ abdominal pain 02/07/2016   Cholecystitis 02/07/2016   Gastroenteritis, acute 02/06/2016   Unstable angina (HCC) 01/27/2016   Precordial chest pain 01/27/2016   Chest pain 07/30/2015   NSTEMI (non-ST elevated myocardial infarction) (HCC) 01/19/2015   Type 2 diabetes mellitus with hyperglycemia, without long-term current use of insulin (HCC) 10/21/2014   Tobacco abuse 10/17/2013   Essential hypertension    Mixed hyperlipidemia    CAD (coronary artery disease)    Social History   Socioeconomic History   Marital status: Significant Other    Spouse name: Not on file   Number of children: 2   Years of education: 18   Highest education level: Bachelor's degree (e.g., BA, AB, BS)  Occupational History   Occupation: Investment banker, 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  Tobacco Use   Smoking status: Former    Current packs/day: 0.00    Average packs/day: 1 pack/day for 45.0 years (45.0 ttl pk-yrs)    Types: Cigarettes    Start date: 01/16/1970    Quit date: 01/17/2015    Years since quitting: 8.2   Smokeless tobacco: Never  Vaping Use   Vaping status: Never Used  Substance and Sexual Activity   Alcohol use: Yes    Alcohol/week: 1.0 standard drink of alcohol    Types: 1 Glasses of wine per week    Comment:  daily   Drug use: No   Sexual activity: Yes  Other Topics Concern   Not on file  Social History Narrative   Fun: Boat, golf   Denies religious beliefs effecting health care.    Social Determinants of Health   Financial Resource Strain: Low Risk  (04/18/2023)   Overall Financial Resource Strain (CARDIA)    Difficulty of Paying Living Expenses: Not hard at all  Food Insecurity: No Food Insecurity (04/18/2023)   Hunger Vital Sign    Worried About Running Out of Food in the Last Year: Never true    Ran Out of Food in the Last Year: Never true  Transportation Needs: No Transportation Needs (04/18/2023)   PRAPARE - Transportation  Lack of Transportation (Medical): No    Lack of Transportation (Non-Medical): No  Physical Activity: Sufficiently Active (04/18/2023)   Exercise Vital Sign    Days of Exercise per Week: 2 days    Minutes of Exercise per Session: 90 min  Stress: No Stress Concern Present (04/18/2023)   Troy Boyer [employer] Occupational [employer] - Occupational Stress Questionnaire    Feeling of Stress : Only a little  Social Connections: Socially Integrated (04/18/2023)   Social Connection and Isolation Panel [NHANES]    Frequency of Communication with Friends and Family: More than three times a week    Frequency of Social Gatherings with Friends and Family: Once a week    Attends Religious Services: 1 to 4 times per year    Active Member of [employer] or Organizations: Yes    Attends Banker Meetings: More than 4 times per year    Marital Status: Living with partner  Intimate Partner Violence: Not At Risk (03/17/2022)   Humiliation, Afraid, Rape, and Kick questionnaire    Fear of Current or Ex-Partner: No    Emotionally Abused: No    Physically Abused: No    Sexually Abused: No    Mr. Hallum family history includes CAD (age of onset: 61) in his brother; CAD (age of onset: 69) in his sister; Crohn's disease in his mother; Dementia in his mother; Diabetes in  his brother and father; Heart disease (age of onset: 70) in his father; Kidney disease in his brother.      Objective:    Vitals:   05/02/23 0835  BP: 118/72  Pulse: 67    Physical Exam Well-developed well-nourished older WM  in no acute distress.  Height, Weight,197 BMI 33.8  HEENT; nontraumatic normocephalic, EOMI, PE R LA, sclera anicteric. Oropharynx;not examined Neck; supple, no JVD Cardiovascular; regular rate and rhythm with S1-S2, no murmur rub or gallop Pulmonary; Clear bilaterally Abdomen; soft, nontender, nondistended, no palpable mass or hepatosplenomegaly, bowel sounds are active Rectal;not done today Skin; benign exam, no jaundice rash or appreciable lesions Extremities; no clubbing cyanosis or edema skin warm and dry Neuro/Psych; alert and oriented x4, grossly nonfocal mood and affect appropriate        Assessment & Plan:   #63 72 year old white male with chronic diarrhea, present over the past 3 to 4 years but worse over the past year to the point of frequent episodes of incontinence. Usually only having 1-2 bowel movements per day and usually very urgent need for bowel movement as soon as he wakes up.  I am not sure that his history is consistent with bile salt induced diarrhea, and him a bit hesitant to start him on colestipol as he is on numerous other medications, and timing a colestipol dose may be difficult.  Since he had an increase in dose of metformin at some point over the past year or so and is taking that medication at bedtime wonder if metformin may be aggravating his symptoms  #2 history of adenomatous colon polyps-up-to-date with colonoscopy #3 diabetes mellitus #4.  Coronary artery disease status post MI #5.  Hypertension  Plan; Have asked him to stop his nighttime dose of metformin over the next 2 weeks as a trial to see if his early morning diarrhea resolves.  If if again improvement off p.m. metformin then will refer back to his PCP for  medication adjustments on his diabetic meds.  If he does not have any response off p.m. metformin then may offer him a  trial of colestipol 2 mg twice daily, and could also consider an additional dose of Imodium just before bedtime.  Covey Baller S Hayze Gazda PA-C 05/02/2023   Cc: Sharlene Dory*

## 2023-05-02 NOTE — Patient Instructions (Addendum)
Stop your bedtime dose of metformin for 2 weeks and then message her LPN Beth with an update.  _______________________________________________________  If your blood pressure at your visit was 140/90 or greater, please contact your primary care physician to follow up on this.  _______________________________________________________  If you are age 71 or older, your body mass index should be between 23-30. Your Body mass index is 33.84 kg/m. If this is out of the aforementioned range listed, please consider follow up with your Primary Care Provider.  If you are age 27 or younger, your body mass index should be between 19-25. Your Body mass index is 33.84 kg/m. If this is out of the aformentioned range listed, please consider follow up with your Primary Care Provider.   ________________________________________________________  The [facility] providers would like to encourage you to use [facility] to communicate with providers for non-urgent requests or questions.  Due to long hold times on the telephone, sending your provider a message by [facility] may be a faster and more efficient way to get a response.  Please allow 48 business hours for a response.  Please remember that this is for non-urgent requests.  _______________________________________________________  I appreciate the opportunity to care for you.  Amy Esterwood PA-C

## 2023-05-02 NOTE — Progress Notes (Signed)
Agree with assessment and plan as outlined.  

## 2023-05-04 ENCOUNTER — Other Ambulatory Visit: Payer: Self-pay | Admitting: Cardiology

## 2023-05-04 DIAGNOSIS — E7800 Pure hypercholesterolemia, unspecified: Secondary | ICD-10-CM

## 2023-05-09 ENCOUNTER — Encounter: Payer: Self-pay | Admitting: Family Medicine

## 2023-05-10 ENCOUNTER — Other Ambulatory Visit: Payer: Self-pay | Admitting: Family Medicine

## 2023-05-10 MED ORDER — PIOGLITAZONE HCL 45 MG PO TABS: 45.0000 mg | ORAL_TABLET | Freq: Every day | ORAL | 3 refills | Status: DC

## 2023-05-11 ENCOUNTER — Other Ambulatory Visit: Payer: Self-pay | Admitting: Family Medicine

## 2023-05-11 MED ORDER — PIOGLITAZONE HCL 45 MG PO TABS: 45.0000 mg | ORAL_TABLET | Freq: Every day | ORAL | 3 refills | Status: DC

## 2023-05-11 MED ORDER — METFORMIN HCL ER 500 MG PO TB24: 1000.0000 mg | ORAL_TABLET | Freq: Every day | ORAL | Status: DC

## 2023-06-14 ENCOUNTER — Encounter: Payer: Self-pay | Admitting: Cardiology

## 2023-06-14 NOTE — Telephone Encounter (Signed)
Patient had a CT last year and it was recommended he repeat in 12 months. He received a call that it had been scheduled. He would like to know if he needs to have it done again this year. Please advise.

## 2023-06-19 ENCOUNTER — Encounter: Payer: Self-pay | Admitting: Family Medicine

## 2023-07-18 DIAGNOSIS — Z808 Family history of malignant neoplasm of other organs or systems: Secondary | ICD-10-CM | POA: Diagnosis not present

## 2023-07-18 DIAGNOSIS — L814 Other melanin hyperpigmentation: Secondary | ICD-10-CM | POA: Diagnosis not present

## 2023-07-18 DIAGNOSIS — D1801 Hemangioma of skin and subcutaneous tissue: Secondary | ICD-10-CM | POA: Diagnosis not present

## 2023-07-18 DIAGNOSIS — L578 Other skin changes due to chronic exposure to nonionizing radiation: Secondary | ICD-10-CM | POA: Diagnosis not present

## 2023-07-18 DIAGNOSIS — L821 Other seborrheic keratosis: Secondary | ICD-10-CM | POA: Diagnosis not present

## 2023-07-18 DIAGNOSIS — L905 Scar conditions and fibrosis of skin: Secondary | ICD-10-CM | POA: Diagnosis not present

## 2023-07-18 DIAGNOSIS — Z85828 Personal history of other malignant neoplasm of skin: Secondary | ICD-10-CM | POA: Diagnosis not present

## 2023-07-18 DIAGNOSIS — L853 Xerosis cutis: Secondary | ICD-10-CM | POA: Diagnosis not present

## 2023-07-22 ENCOUNTER — Ambulatory Visit
Admission: RE | Admit: 2023-07-22 | Discharge: 2023-07-22 | Disposition: A | Discharge status: Home / Self Care | Payer: Medicare Other | Source: Ambulatory Visit | Attending: Family Medicine | Admitting: Family Medicine

## 2023-07-22 DIAGNOSIS — Z122 Encounter for screening for malignant neoplasm of respiratory organs: Secondary | ICD-10-CM

## 2023-07-22 DIAGNOSIS — Z87891 Personal history of nicotine dependence: Secondary | ICD-10-CM

## 2023-07-26 ENCOUNTER — Encounter: Payer: Self-pay | Admitting: Family Medicine

## 2023-07-26 ENCOUNTER — Ambulatory Visit: Payer: Self-pay | Admitting: Family Medicine

## 2023-07-26 ENCOUNTER — Ambulatory Visit (INDEPENDENT_AMBULATORY_CARE_PROVIDER_SITE_OTHER): Payer: Medicare Other | Admitting: Family Medicine

## 2023-07-26 VITALS — BP 120/74 | HR 66 | Temp 98.0°F | Resp 16 | Ht 64.0 in | Wt 197.0 lb

## 2023-07-26 DIAGNOSIS — H8110 Benign paroxysmal vertigo, unspecified ear: Secondary | ICD-10-CM

## 2023-07-26 MED ORDER — ONDANSETRON 4 MG PO TBDP: 4.0000 mg | ORAL_TABLET | Freq: Three times a day (TID) | ORAL | 0 refills | Status: DC | PRN

## 2023-07-26 NOTE — Patient Instructions (Signed)
Refer to Youtube if this does not make sense.   Stay hydrated.  Let us know if you need anything.

## 2023-07-26 NOTE — Progress Notes (Signed)
Chief Complaint  Patient presents with   Dizziness    Dizziness     Troy Boyer is 73 y.o. pt here for dizziness.  He is here with his wife.  Duration: 2 days Pass out? No Spinning? Yes Recent illness/fever? No Headache? No Neurologic signs? No Change in PO intake? No   Past Medical History:  Diagnosis Date   Adenomatous colon polyp    tubular   CAD (coronary artery disease)    a. Cath 06/08/01 at [facility] with normal LM and LAD, 95% LCx s/p Circumflex stent 4.0 x 15 mm Penta and 85% and 80% prox RCA s/p 3.5 x 23 mm Penta b. cath 01/20/2015 95% prox LCx ISR treated with DES, 55% prox to mid RCA, 45% distal RCA, 40% midLAD, EF 55%     Cataract    Diabetes mellitus without complication (HCC)    Diarrhea    Diverticulosis    Erectile dysfunction    Gallstones    Hyperlipidemia    Hypertension    Myocardial infarction [facility])     Family History  Problem Relation Age of Onset   Dementia Mother    Crohn's disease Mother    Heart disease Father 66       Valve replacement and CAD, CABG   Diabetes Father    CAD Sister 38   CAD Brother 5       Died age 47   Diabetes Brother    Kidney disease Brother    Colon cancer Neg Hx    Esophageal cancer Neg Hx    Stomach cancer Neg Hx    Rectal cancer Neg Hx    Pancreatic cancer Neg Hx     Allergies as of 07/26/2023   No Known Allergies      Medication List        Accurate as of July 26, 2023  4:49 PM. If you have any questions, ask your nurse or doctor.          Accu-Chek FastClix Lancets Misc CHECK BLOOD SUGAR ONCE OR  TWICE WEEKLY   Accu-Chek Guide test strip Generic drug: glucose blood Use daily to check blood sugar.  DX E11.65   acetaminophen 500 MG tablet Commonly known as: TYLENOL Take 1,000 mg by mouth every 6 (six) hours as needed for headache (pain).   amLODipine 5 MG tablet Commonly known as: NORVASC TAKE 1 TABLET BY MOUTH DAILY   aspirin EC 81 MG tablet Take 81 mg by mouth daily.    atorvastatin 80 MG tablet Commonly known as: LIPITOR TAKE 1 TABLET BY MOUTH AT  BEDTIME   clomiPHENE 50 MG tablet Commonly known as: CLOMID Take by mouth daily.   ezetimibe 10 MG tablet Commonly known as: ZETIA Take 1 tablet by mouth once daily   fenofibrate 48 MG tablet Commonly known as: TRICOR TAKE 1 TABLET BY MOUTH DAILY   FreeStyle Libre 3 Sensor Misc Use to check blood sugar   Krill Oil 500 MG Caps Take 500 mg by mouth 2 (two) times daily.   metFORMIN 500 MG 24 hr tablet Commonly known as: GLUCOPHAGE-XR Take 2 tablets (1,000 mg total) by mouth daily with breakfast.   metoprolol tartrate 25 MG tablet Commonly known as: LOPRESSOR Take 1 tablet (25 mg total) by mouth 2 (two) times daily.   multivitamin with minerals Tabs tablet Take 1 tablet by mouth daily.   Myrbetriq 50 MG Tb24 tablet Generic drug: mirabegron ER Take 50 mg by mouth daily.  nitroGLYCERIN 0.4 MG SL tablet Commonly known as: NITROSTAT Place 1 tablet (0.4 mg total) under the tongue every 5 (five) minutes as needed for chest pain.   ondansetron 4 MG disintegrating tablet Commonly known as: ZOFRAN-ODT Take 1 tablet (4 mg total) by mouth every 8 (eight) hours as needed for nausea or vomiting. Started by: Sharlene Dory   pioglitazone 45 MG tablet Commonly known as: Actos Take 1 tablet (45 mg total) by mouth daily.   ramipril 10 MG capsule Commonly known as: ALTACE TAKE 1 CAPSULE BY MOUTH TWICE  DAILY   tadalafil 5 MG tablet Commonly known as: CIALIS Take 1 tablet (5 mg total) by mouth daily.   traZODone 50 MG tablet Commonly known as: DESYREL TAKE 1 TABLET BY MOUTH AT  BEDTIME AS NEEDED FOR SLEEP        BP 120/74   Pulse 66   Temp 98 F (36.7 C) (Oral)   Resp 16   Ht 5\' 4"  (1.626 m)   Wt 197 lb (89.4 kg)   SpO2 99%   BMI 33.81 kg/m  General: Awake, alert, appears stated age Eyes: PERRLA, EOMi Mouth: MMD Heart: RRR, no murmurs, no carotid bruits Lungs: CTAB, no  accessory muscle use Neuro: He is unable to sit on the table so certain portions of the exam are limited.  I was able to extend his head and turned to the right.  This did elicit spinning that resolved when returning to head to the neutral position. Psych: Age appropriate judgment and insight, normal mood and affect  Benign paroxysmal positional vertigo, unspecified laterality - Plan: ondansetron (ZOFRAN-ODT) 4 MG disintegrating tablet  Zofran as needed for nausea.  Hopefully this helps him keep down some fluids.  If he is struggling tomorrow, will try to get him in with the infusion clinic to get a bolus of normal saline.  If things get worse or small amounts of movement stimulate spinning, his wife is to take him to the emergency department.  Epley maneuver information provided in AVS. F/u prn. Pt and his wife voiced understanding and agreement to the plan.  Jilda Roche [employer], DO 07/26/23 4:49 PM

## 2023-07-26 NOTE — Telephone Encounter (Signed)
  Chief Complaint: Dizziness-Vertigo Symptoms: vomiting, head spinning, extreme fatigue Frequency: started 2 days ago Pertinent Negatives: Patient denies fever, CP Disposition: [x] ED /[] Urgent Care (no appt availability in office) / [] Appointment(In office/virtual)/ []  [facility]/ [] Home Care/ [] Refused Recommended Disposition /[] [facility]/ []  Follow-up with PCP Additional Notes: patient's wife calling with patient near by to answer questions. Patient endorses 2 days of dizziness (patient referred to vertigo), violent vomiting, head spinning and extreme fatigue. Patient states he feels like the room is spinning at times but also needs help to move around the house from his wife. Per protocol, the recommendation would be for the Emergency Department. Patient and wife agree to recommendation and verbalize understanding of plan. All questions answered.     Copied from CRM [id number]. Topic: Clinical - Red Word Triage >> Jul 26, 2023  2:29 PM Larwance Sachs wrote: Red Word that prompted transfer to Nurse Triage: Patient representative called in regarding patient being very sick- symptoms are - throwing up very violently , head spinning - vertigo and extreme fatigue Reason for Disposition  SEVERE dizziness (vertigo) (e.g., unable to walk without assistance)  Answer Assessment - Initial Assessment Questions 1. DESCRIPTION: "Describe your dizziness."     Extreme dizziness 2. VERTIGO: "Do you feel like either you or the room is spinning or tilting?"      Patient is having head spinning and extreme fatigue. Patient feels like the room is spinning 3. LIGHTHEADED: "Do you feel lightheaded?" (e.g., somewhat faint, woozy, weak upon standing)     Yes-weak  4. SEVERITY: "How bad is it?"  "Can you walk?"   - MILD: Feels slightly dizzy and unsteady, but is walking normally.   - MODERATE: Feels unsteady when walking, but not falling; interferes with normal activities (e.g., school, work).    - SEVERE: Unable to walk without falling, or requires assistance to walk without falling.     Severe 5. ONSET:  "When did the dizziness begin?"     2 days ago 6. AGGRAVATING FACTORS: "Does anything make it worse?" (e.g., standing, change in head position)     Laying down in worse, bad all the time 7. CAUSE: "What do you think is causing the dizziness?"     unsure 8. RECURRENT SYMPTOM: "Have you had dizziness before?" If Yes, ask: "When was the last time?" "What happened that time?"     Symptoms were similar to when he had his gallbladder out in 2018 9. OTHER SYMPTOMS: "Do you have any other symptoms?" (e.g., headache, weakness, numbness, vomiting, earache)     vomiting  Protocols used: Dizziness - Vertigo-A-AH

## 2023-07-26 NOTE — Telephone Encounter (Signed)
Called pt lvm if he can come in today at 415 to be seen, Ask pt to call our office back if this time will work.

## 2023-07-27 ENCOUNTER — Telehealth: Payer: Self-pay | Admitting: Family Medicine

## 2023-07-27 ENCOUNTER — Encounter: Payer: Self-pay | Admitting: Cardiology

## 2023-07-27 NOTE — Telephone Encounter (Signed)
Copied from CRM [id number]. Topic: General - Other >> Jul 27, 2023  2:02 PM Samuel Jester B wrote: Reason for CRM: Pt wife Amil Amen called to give an update on the pt and stated that his fatigue is still high, his urine is dark yellow/orange, he is drinking water and Gatorade. He is not completely hydrated yet.

## 2023-07-27 NOTE — Telephone Encounter (Signed)
Are we able to set him up with the infusion team for IV hydration. 2 L NS 999 mL/hr ideally. Ty.

## 2023-07-28 NOTE — Telephone Encounter (Signed)
Spoke to pt about Apple watch notifications.  He states he did not feel anything at all, NO Chest Pain, NO palpitations, NO SOB, NO dizziness.  He was severely dehydrated and saw his PCP on 07/26/23 c/o Vertigo.  He also states that today he has felt the best, since he began to feel sick.  He has been hydrating and feels he is getting over the dehydration.    Advised pt to get back in touch with [facility] if he has any further notifications from Apple Watch, and especially if he becomes symptomatic.  Pt verbalized understanding.  Scheduled his yearly f/u w/Dr. Jens Som for 11/18/23.

## 2023-07-29 ENCOUNTER — Other Ambulatory Visit (HOSPITAL_COMMUNITY): Payer: Self-pay | Admitting: Pharmacy Technician

## 2023-07-29 DIAGNOSIS — H8110 Benign paroxysmal vertigo, unspecified ear: Secondary | ICD-10-CM | POA: Insufficient documentation

## 2023-07-29 DIAGNOSIS — E860 Dehydration: Secondary | ICD-10-CM | POA: Insufficient documentation

## 2023-07-29 NOTE — Telephone Encounter (Signed)
Fluids ordered by Desma Mcgregor, RPH. Infusion clinic to schedule patient.

## 2023-07-29 NOTE — Telephone Encounter (Signed)
Message/request sent to IV Infusion RN and pharmacy manager.

## 2023-07-30 ENCOUNTER — Encounter (HOSPITAL_BASED_OUTPATIENT_CLINIC_OR_DEPARTMENT_OTHER): Payer: Self-pay | Admitting: Emergency Medicine

## 2023-07-30 ENCOUNTER — Other Ambulatory Visit: Payer: Self-pay

## 2023-07-30 ENCOUNTER — Emergency Department (HOSPITAL_BASED_OUTPATIENT_CLINIC_OR_DEPARTMENT_OTHER): Payer: Medicare Other

## 2023-07-30 ENCOUNTER — Emergency Department (HOSPITAL_BASED_OUTPATIENT_CLINIC_OR_DEPARTMENT_OTHER)
Admission: EM | Admit: 2023-07-30 | Discharge: 2023-07-30 | Disposition: A | Discharge status: Home / Self Care | Payer: Medicare Other | Attending: Emergency Medicine | Admitting: Emergency Medicine

## 2023-07-30 DIAGNOSIS — Z955 Presence of coronary angioplasty implant and graft: Secondary | ICD-10-CM | POA: Diagnosis not present

## 2023-07-30 DIAGNOSIS — R5383 Other fatigue: Secondary | ICD-10-CM | POA: Diagnosis not present

## 2023-07-30 DIAGNOSIS — J189 Pneumonia, unspecified organism: Secondary | ICD-10-CM

## 2023-07-30 DIAGNOSIS — I700 Atherosclerosis of aorta: Secondary | ICD-10-CM | POA: Diagnosis not present

## 2023-07-30 DIAGNOSIS — E119 Type 2 diabetes mellitus without complications: Secondary | ICD-10-CM | POA: Insufficient documentation

## 2023-07-30 DIAGNOSIS — I2510 Atherosclerotic heart disease of native coronary artery without angina pectoris: Secondary | ICD-10-CM | POA: Diagnosis not present

## 2023-07-30 DIAGNOSIS — I10 Essential (primary) hypertension: Secondary | ICD-10-CM | POA: Insufficient documentation

## 2023-07-30 DIAGNOSIS — Z1152 Encounter for screening for COVID-19: Secondary | ICD-10-CM | POA: Insufficient documentation

## 2023-07-30 DIAGNOSIS — R509 Fever, unspecified: Secondary | ICD-10-CM | POA: Insufficient documentation

## 2023-07-30 DIAGNOSIS — E785 Hyperlipidemia, unspecified: Secondary | ICD-10-CM | POA: Insufficient documentation

## 2023-07-30 DIAGNOSIS — Z7984 Long term (current) use of oral hypoglycemic drugs: Secondary | ICD-10-CM | POA: Insufficient documentation

## 2023-07-30 DIAGNOSIS — R531 Weakness: Secondary | ICD-10-CM | POA: Insufficient documentation

## 2023-07-30 DIAGNOSIS — Z79899 Other long term (current) drug therapy: Secondary | ICD-10-CM | POA: Insufficient documentation

## 2023-07-30 DIAGNOSIS — Z7982 Long term (current) use of aspirin: Secondary | ICD-10-CM | POA: Insufficient documentation

## 2023-07-30 DIAGNOSIS — R21 Rash and other nonspecific skin eruption: Secondary | ICD-10-CM | POA: Diagnosis not present

## 2023-07-30 DIAGNOSIS — R918 Other nonspecific abnormal finding of lung field: Secondary | ICD-10-CM | POA: Diagnosis not present

## 2023-07-30 LAB — URINALYSIS, ROUTINE W REFLEX MICROSCOPIC
Bilirubin Urine: NEGATIVE
Glucose, UA: NEGATIVE mg/dL
Ketones, ur: NEGATIVE mg/dL
Leukocytes,Ua: NEGATIVE
Nitrite: NEGATIVE
Protein, ur: NEGATIVE mg/dL
Specific Gravity, Urine: 1.02 (ref 1.005–1.030)
pH: 5.5 (ref 5.0–8.0)

## 2023-07-30 LAB — CBC
HCT: 37.7 % — ABNORMAL LOW (ref 39.0–52.0)
Hemoglobin: 12 g/dL — ABNORMAL LOW (ref 13.0–17.0)
MCH: 29.1 pg (ref 26.0–34.0)
MCHC: 31.8 g/dL (ref 30.0–36.0)
MCV: 91.5 fL (ref 80.0–100.0)
Platelets: 201 10*3/uL (ref 150–400)
RBC: 4.12 MIL/uL — ABNORMAL LOW (ref 4.22–5.81)
RDW: 15.4 % (ref 11.5–15.5)
WBC: 7.9 10*3/uL (ref 4.0–10.5)
nRBC: 0 % (ref 0.0–0.2)

## 2023-07-30 LAB — BASIC METABOLIC PANEL
Anion gap: 10 (ref 5–15)
BUN: 11 mg/dL (ref 8–23)
CO2: 26 mmol/L (ref 22–32)
Calcium: 8.9 mg/dL (ref 8.9–10.3)
Chloride: 101 mmol/L (ref 98–111)
Creatinine, Ser: 0.93 mg/dL (ref 0.61–1.24)
GFR, Estimated: >60 mL/min (ref >60)
Glucose, Bld: 154 mg/dL — ABNORMAL HIGH (ref 70–99)
Potassium: 3.6 mmol/L (ref 3.5–5.1)
Sodium: 137 mmol/L (ref 135–145)

## 2023-07-30 LAB — URINALYSIS, MICROSCOPIC (REFLEX)

## 2023-07-30 LAB — RESP PANEL BY RT-PCR (RSV, FLU A&B, COVID)  RVPGX2
Influenza A by PCR: NEGATIVE
Influenza B by PCR: NEGATIVE
Resp Syncytial Virus by PCR: NEGATIVE
SARS Coronavirus 2 by RT PCR: NEGATIVE

## 2023-07-30 MED ORDER — AZITHROMYCIN 250 MG PO TABS
500.0000 mg | ORAL_TABLET | Freq: Once | ORAL | Status: AC
  Administered 2023-07-30: 500 mg via ORAL
  Filled 2023-07-30: qty 2

## 2023-07-30 MED ORDER — ACETAMINOPHEN 500 MG PO TABS
1000.0000 mg | ORAL_TABLET | Freq: Once | ORAL | Status: AC
  Administered 2023-07-30: 1000 mg via ORAL
  Filled 2023-07-30: qty 2

## 2023-07-30 MED ORDER — AZITHROMYCIN 250 MG PO TABS: 250.0000 mg | ORAL_TABLET | Freq: Every day | ORAL | 0 refills | Status: DC

## 2023-07-30 NOTE — ED Triage Notes (Signed)
Patient reports "severe fatigue", facial rash, and decreased appetite since Monday. Recently seen at pcp for vertigo episode. Patient denies vertigo sx currently.

## 2023-07-30 NOTE — Discharge Instructions (Signed)
Begin taking Zithromax as prescribed.  Take Tylenol 1000 mg rotated with ibuprofen 600 mg every 4 hours as needed for fever.  Follow-up with primary doctor if not improving in the next few days, and return to the ER if symptoms significantly worsen or change.

## 2023-07-30 NOTE — ED Provider Notes (Signed)
[place] [facility] Provider Note   CSN: [id number] Arrival date & time: 07/30/23  1810     History  Chief Complaint  Patient presents with   Fatigue    Troy Boyer is a 73 y.o. male.  Patient is a 73 year old male with past medical history of coronary artery disease with stents, hypertension, hyperlipidemia, type 2 diabetes, cholecystectomy.  Patient presenting today with complaints of weakness and fatigue.  This has been ongoing for the past 2 days.  Wife states that he has had little energy and has not gotten out of bed.  He denies any chest pain or difficulty breathing.  No fevers or chills, but did develop a fever here in the ER.  No aggravating or alleviating factors.  The history is provided by the patient.       Home Medications Prior to Admission medications   Medication Sig Start Date End Date Taking? Authorizing Provider  Accu-Chek FastClix Lancets MISC CHECK BLOOD SUGAR ONCE OR  TWICE WEEKLY 04/25/23   Sharlene Dory, DO  acetaminophen (TYLENOL) 500 MG tablet Take 1,000 mg by mouth every 6 (six) hours as needed for headache (pain).    [provider]  amLODipine (NORVASC) 5 MG tablet TAKE 1 TABLET BY MOUTH DAILY 12/31/22   Sharlene Dory, DO  aspirin EC 81 MG tablet Take 81 mg by mouth daily.    [provider]  atorvastatin (LIPITOR) 80 MG tablet TAKE 1 TABLET BY MOUTH AT  BEDTIME 12/31/22   Wendling, Jilda Roche, DO  clomiPHENE (CLOMID) 50 MG tablet Take by mouth daily.    [provider]  Continuous Glucose Sensor (FREESTYLE LIBRE 3 SENSOR) MISC Use to check blood sugar 01/26/23   Wendling, Jilda Roche, DO  ezetimibe (ZETIA) 10 MG tablet Take 1 tablet by mouth once daily 05/04/23   Lewayne Bunting, MD  fenofibrate (TRICOR) 48 MG tablet TAKE 1 TABLET BY MOUTH DAILY 11/18/22   Sharlene Dory, DO  glucose blood (ACCU-CHEK GUIDE) test strip Use daily to check blood sugar.  DX  E11.65 04/25/23   Sharlene Dory, DO  Krill Oil 500 MG CAPS Take 500 mg by mouth 2 (two) times daily.    [provider]  metFORMIN (GLUCOPHAGE-XR) 500 MG 24 hr tablet Take 2 tablets (1,000 mg total) by mouth daily with breakfast. 05/11/23   Wendling, Jilda Roche, DO  metoprolol tartrate (LOPRESSOR) 25 MG tablet Take 1 tablet (25 mg total) by mouth 2 (two) times daily. 11/24/22   Azalee Course, PA  Multiple Vitamin (MULTIVITAMIN WITH MINERALS) TABS tablet Take 1 tablet by mouth daily.    [provider]  MYRBETRIQ 50 MG TB24 tablet Take 50 mg by mouth daily. 04/10/23   [provider]  nitroGLYCERIN (NITROSTAT) 0.4 MG SL tablet Place 1 tablet (0.4 mg total) under the tongue every 5 (five) minutes as needed for chest pain. 04/26/22   Sharlene Dory, DO  ondansetron (ZOFRAN-ODT) 4 MG disintegrating tablet Take 1 tablet (4 mg total) by mouth every 8 (eight) hours as needed for nausea or vomiting. 07/26/23   Carmelia Roller, Jilda Roche, DO  pioglitazone (ACTOS) 45 MG tablet Take 1 tablet (45 mg total) by mouth daily. 05/11/23   Sharlene Dory, DO  ramipril (ALTACE) 10 MG capsule TAKE 1 CAPSULE BY MOUTH TWICE  DAILY 12/06/22   Lewayne Bunting, MD  tadalafil (CIALIS) 5 MG tablet Take 1 tablet (5 mg total) by mouth daily.  10/19/21   Sharlene Dory, DO  traZODone (DESYREL) 50 MG tablet TAKE 1 TABLET BY MOUTH AT  BEDTIME AS NEEDED FOR SLEEP 10/14/22   Sharlene Dory, DO      Allergies    Patient has no known allergies.    Review of Systems   Review of Systems  All other systems reviewed and are negative.   Physical Exam Updated Vital Signs BP 134/70   Pulse 87   Temp 100 F (37.8 C) (Oral)   Resp (!) 24   Ht 5\' 4"  (1.626 m)   Wt 79.4 kg   SpO2 94%   BMI 30.04 kg/m  Physical Exam Vitals and nursing note reviewed.  Constitutional:      General: He is not in acute distress.    Appearance: He is well-developed. He is not  diaphoretic.  HENT:     Head: Normocephalic and atraumatic.  Cardiovascular:     Rate and Rhythm: Normal rate and regular rhythm.     Heart sounds: No murmur heard.    No friction rub.  Pulmonary:     Effort: Pulmonary effort is normal. No respiratory distress.     Breath sounds: Normal breath sounds. No wheezing or rales.  Abdominal:     General: Bowel sounds are normal. There is no distension.     Palpations: Abdomen is soft.     Tenderness: There is no abdominal tenderness.  Musculoskeletal:        General: Normal range of motion.     Cervical back: Normal range of motion and neck supple.  Skin:    General: Skin is warm and dry.  Neurological:     Mental Status: He is alert and oriented to person, place, and time.     Coordination: Coordination normal.     ED Results / Procedures / Treatments   Labs (all labs ordered are listed, but only abnormal results are displayed) Labs Reviewed  BASIC METABOLIC PANEL - Abnormal; Notable for the following components:      Result Value   Glucose, Bld 154 (*)    All other components within normal limits  CBC - Abnormal; Notable for the following components:   RBC 4.12 (*)    Hemoglobin 12.0 (*)    HCT 37.7 (*)    All other components within normal limits  URINALYSIS, ROUTINE W REFLEX MICROSCOPIC - Abnormal; Notable for the following components:   Hgb urine dipstick TRACE (*)    All other components within normal limits  URINALYSIS, MICROSCOPIC (REFLEX) - Abnormal; Notable for the following components:   Bacteria, UA FEW (*)    All other components within normal limits  RESP PANEL BY RT-PCR (RSV, FLU A&B, COVID)  [id number]    EKG EKG Interpretation Date/Time:  Saturday July 30 2023 18:54:54 EST Ventricular Rate:  73 PR Interval:  140 QRS Duration:  93 QT Interval:  414 QTC Calculation: 457 R Axis:   -64  Text Interpretation: Sinus rhythm Left anterior fascicular block Abnormal R-wave progression, late transition no sig  change Confirmed by Vanetta Mulders [phone]) on 07/30/2023 9:51:32 PM  Radiology DG Chest Port 1 View Result Date: 07/30/2023 CLINICAL DATA:  Severe fatigue, facial rash and decreased appetite EXAM: PORTABLE CHEST 1 VIEW COMPARISON:  02/08/2016 FINDINGS: Stable cardiomediastinal silhouette. Aortic atherosclerotic calcification. Bibasilar atelectasis or infiltrates. No pleural effusion or pneumothorax. IMPRESSION: Bibasilar atelectasis or infiltrates. Electronically Signed   By: Minerva Fester M.D.   On: 07/30/2023 22:28    Procedures  Procedures    Medications Ordered in ED Medications  azithromycin (ZITHROMAX) tablet 500 mg (has no administration in time range)  acetaminophen (TYLENOL) tablet 1,000 mg (1,000 mg Oral Given 07/30/23 2153)    ED Course/ Medical Decision Making/ A&P  Patient is a 73 year old male presenting with weakness and fatigue as described in the HPI.  Patient arrives with stable vital signs.  He was initially afebrile, but developed a fever of 101.9.  Physical examination is unremarkable.  Laboratory studies obtained including CBC, metabolic panel, urinalysis, and respiratory panel.  All studies basically unremarkable.  Chest x-ray suggestive of atelectasis versus infiltrate.  Given the fever of 101.9, I will favor infiltrate and prescribed Zithromax.  This otherwise, patient seems well and is clinically well-appearing.  He has no other symptoms that would explain the fever.  Patient to be discharged with Zithromax and as needed return.  Final Clinical Impression(s) / ED Diagnoses Final diagnoses:  None    Rx / DC Orders ED Discharge Orders     None         Geoffery Lyons, MD 07/30/23 2323

## 2023-08-01 ENCOUNTER — Telehealth: Payer: Self-pay

## 2023-08-01 ENCOUNTER — Other Ambulatory Visit: Payer: Self-pay | Admitting: Family

## 2023-08-01 ENCOUNTER — Encounter: Payer: Self-pay | Admitting: Family Medicine

## 2023-08-01 MED ORDER — VALACYCLOVIR HCL 1 G PO TABS: 1000.0000 mg | ORAL_TABLET | Freq: Three times a day (TID) | ORAL | 0 refills | Status: DC

## 2023-08-01 NOTE — Telephone Encounter (Signed)
Called patient on Friday, 07/29/23 to schedule for fluid infusion per orders from Dr. Arva Chafe, DO. Left voicemail. Called again this morning and spoke with patient. Stated that he did not wish to schedule infusion appointment, as he was hydrating orally at home and was also seen in the ED over the weekend and received fluids. Message relayed to ordering provider via secure staff message.  Wyvonne Lenz, RN

## 2023-08-01 NOTE — Telephone Encounter (Signed)
 Pt seen at ED

## 2023-08-01 NOTE — Telephone Encounter (Signed)
Initial Comment Caller states patient is eating a half a cup of soup a day, does not have enough strength to brush his teeth, needs help to get out of bed, very weak. Just wants to sleep. He is hydrating better than before Translation No Nurse Assessment Nurse: Myles Rosenthal, RN, Konrad Felix Date/Time (Eastern Time): 07/30/2023 3:47:43 PM Confirm and document reason for call. If symptomatic, describe symptoms. ---Caller states patient has increased weakness, only gets out of bed to urinate, unable to get up to brush his teeth or shower, feels very fatigued. She reported this is not his baseline. denies fever, cough, congestion. He has been increasing fluid intake, urine is clear and drinking water and Gatorade. Does the patient have any new or worsening symptoms? ---Yes Will a triage be completed? ---Yes Related visit to physician within the last 2 weeks? ---No Does the PT have any chronic conditions? (i.e. diabetes, asthma, this includes High risk factors for pregnancy, etc.) ---Yes List chronic conditions. ---heart conditions (stents) borderline DM HTN Is this a behavioral health or substance abuse call? ---No Guidelines Guideline Title Affirmed Question Affirmed Notes Nurse Date/Time (Eastern Time) Weakness (Generalized) and Fatigue [1] MODERATE weakness (i.e., interferes with work, school, normal activities) AND Cruise, RN, Konrad Felix 07/30/2023 3:52:59 PM PLEASE NOTE: All timestamps contained within this report are represented as [place] Standard Time. CONFIDENTIALTY NOTICE: This fax transmission is intended only for the addressee. It contains information that is legally privileged, confidential or otherwise protected from use or disclosure. If you are not the intended recipient, you are strictly prohibited from reviewing, disclosing, copying using or disseminating any of this information or taking any action in reliance on or regarding this information. If you have received this fax in  error, please notify us immediately by telephone so that we can arrange for its return to [place]. Phone: [phone], Toll-Free: [phone], Fax: [phone] Page: 2 of 2 Call Id: [id number] Guidelines Guideline Title Affirmed Question Affirmed Notes Nurse Date/Time Lamount Cohen Time) [2] cause unknown (Exceptions: Weakness from acute minor illness or poor fluid intake; weakness is chronic and not worse.) Disp. Time Lamount Cohen Time) Disposition Final User 07/30/2023 3:27:24 PM Attempt made - message left Cruise, RN, Katelyn 07/30/2023 4:04:00 PM See HCP within 4 Hours (or PCP triage) Yes Cruise, RN, Konrad Felix Final Disposition 07/30/2023 4:04:00 PM See HCP within 4 Hours (or PCP triage) Yes Cruise, RN, Konrad Felix Caller Disagree/Comply Comply Caller Understands Yes PreDisposition InappropriateToAsk Care Advice Given Per Guideline SEE HCP (OR PCP TRIAGE) WITHIN 4 HOURS: * IF OFFICE WILL BE CLOSED AND NO PCP (PRIMARY CARE PROVIDER) SECOND-LEVEL TRIAGE: You need to be seen within the next 3 or 4 hours. A nearby Urgent Care Center [facility]) is often a good source of care. Another choice is to go to the ED. Go sooner if you become worse. BRING MEDICINES: * Please bring a list of your current medicines when you go to see the doctor. CALL BACK IF: * You become worse CARE ADVICE given per Weakness (Generalized) and Fatigue (Adult) guideline. Comments User: Lasandra Beech, RN Date/Time (Eastern Time): 07/30/2023 3:55:06 PM weakness has been progressing over months, walking has turned into wobbling with increased overall weakness Referrals GO TO FACILITY UNDECIDED

## 2023-08-05 ENCOUNTER — Telehealth: Payer: Self-pay

## 2023-08-05 NOTE — Telephone Encounter (Addendum)
 Called patient to review results. LVM. Results have been reviewed with Ruthell, NP. One year annual CT recommended for LCS. Advises Aortic valvular calcifications were seen. Pt had an echo 07/2022 and is followed by Dr. Pietro. Patient should follow up with Dr. Pietro for additional imaging if needed. Results should be faxed to PCP and Dr. Pietro. Please place annual CT order.

## 2023-08-07 ENCOUNTER — Other Ambulatory Visit: Payer: Self-pay | Admitting: Cardiology

## 2023-08-07 DIAGNOSIS — E7800 Pure hypercholesterolemia, unspecified: Secondary | ICD-10-CM

## 2023-08-08 ENCOUNTER — Other Ambulatory Visit: Payer: Self-pay

## 2023-08-08 DIAGNOSIS — Z87891 Personal history of nicotine dependence: Secondary | ICD-10-CM

## 2023-08-08 DIAGNOSIS — Z122 Encounter for screening for malignant neoplasm of respiratory organs: Secondary | ICD-10-CM

## 2023-08-08 NOTE — Telephone Encounter (Addendum)
 Called patient and reviewed results. Advised one year annual CT recommended for LCS. Advised Aortic valvular calcifications were seen. Pt had an echo 07/2022 and is followed by Dr. Audery Blazing. Patient will continue to follow up with Dr. Audery Blazing as planned and will discuss additional imaging if needed. Results were faxed to PCP and Dr. Audery Blazing. Annual CT order placed. Patient had no additional questions and agrees with plan.

## 2023-08-09 ENCOUNTER — Encounter: Payer: Self-pay | Admitting: Family Medicine

## 2023-08-18 ENCOUNTER — Telehealth: Payer: Self-pay

## 2023-08-18 DIAGNOSIS — R948 Abnormal results of function studies of other organs and systems: Secondary | ICD-10-CM | POA: Diagnosis not present

## 2023-08-18 DIAGNOSIS — R3915 Urgency of urination: Secondary | ICD-10-CM | POA: Diagnosis not present

## 2023-08-18 LAB — LAB REPORT - SCANNED: PSA, FREE: 3.39

## 2023-08-18 NOTE — Telephone Encounter (Signed)
Initial Comment Caller states he has extreme fatigue and loss of appetite. Translation No No Triage Reason Patient declined Nurse Assessment Nurse: [name], RN, [name] Date/Time Troy Boyer Time): 08/18/2023 12:35:51 PM Confirm and document reason for call. If symptomatic, describe symptoms. ---Caller states he has been really fatigued and loss of appetite. He did not wish to speak with the after hours nurse. Says he made an appt with the office online for Monday and will follow up then. Does the patient have any new or worsening symptoms? ---Yes Will a triage be completed? ---No Select reason for no triage. ---Patient declined Disp. Time Troy Boyer Time) Disposition Final User 08/18/2023 12:23:22 PM Attempt made - message left [name] 08/18/2023 12:39:33 PM Clinical Call Yes [name], RN, [name] Final Disposition 08/18/2023 12:39:33 PM Clinical Call Yes [name], RN, [name]

## 2023-08-22 ENCOUNTER — Encounter: Payer: Self-pay | Admitting: Family Medicine

## 2023-08-22 ENCOUNTER — Ambulatory Visit (INDEPENDENT_AMBULATORY_CARE_PROVIDER_SITE_OTHER): Payer: Medicare Other | Admitting: Family Medicine

## 2023-08-22 VITALS — BP 134/80 | HR 77 | Temp 98.0°F | Resp 16 | Ht 64.0 in | Wt 174.8 lb

## 2023-08-22 DIAGNOSIS — R1319 Other dysphagia: Secondary | ICD-10-CM | POA: Diagnosis not present

## 2023-08-22 DIAGNOSIS — R1011 Right upper quadrant pain: Secondary | ICD-10-CM

## 2023-08-22 DIAGNOSIS — R634 Abnormal weight loss: Secondary | ICD-10-CM | POA: Diagnosis not present

## 2023-08-22 DIAGNOSIS — E1165 Type 2 diabetes mellitus with hyperglycemia: Secondary | ICD-10-CM

## 2023-08-22 MED ORDER — PANTOPRAZOLE SODIUM 40 MG PO TBEC: 40.0000 mg | DELAYED_RELEASE_TABLET | Freq: Every day | ORAL | 1 refills | Status: DC

## 2023-08-22 NOTE — Progress Notes (Signed)
 Chief Complaint  Patient presents with   Fatigue    Fatigue     Subjective: Patient is a 73 y.o. male here for f/u.  Over the past month, he has lost around 20 pounds unintentionally.  He saw him for vertigo which has since resolved.  He is no longer having nausea and vomiting either.  For the past 2 weeks, he has had difficulty swallowing in his upper chest region.  He saw Dr. Adela Lank in August 2022 where he had a colonoscopy and EGD with dilatation.  He denies blood in the stool/urine or dark tarry stools.  He does have epigastric/right upper quadrant abdominal pain intermittently that is sharp.  Sometimes it is after meals or when he is laying down.  He does not take anything for reflux routinely.  He no longer has a gallbladder.  He had a chest x-ray on 07/30/2023 in the ER showing bilateral infiltrates and he was treated with azithromycin.  No improvement in his diet.  He is not coughing.  No fevers.  He has associated fatigue and difficulty sleeping.  Interestingly, he stopped taking his Clomid around 1 month ago just prior to this starting.  Past Medical History:  Diagnosis Date   Adenomatous colon polyp    tubular   CAD (coronary artery disease)    a. Cath 06/08/01 at [facility] with normal LM and LAD, 95% LCx s/p Circumflex stent 4.0 x 15 mm Penta and 85% and 80% prox RCA s/p 3.5 x 23 mm Penta b. cath 01/20/2015 95% prox LCx ISR treated with DES, 55% prox to mid RCA, 45% distal RCA, 40% midLAD, EF 55%     Cataract    Diabetes mellitus without complication (HCC)    Diarrhea    Diverticulosis    Erectile dysfunction    Gallstones    Hyperlipidemia    Hypertension    Myocardial infarction (HCC)     Objective: BP 134/80 (BP Location: Left Arm, Patient Position: Sitting)   Pulse 77   Temp 98 F (36.7 C) (Oral)   Resp 16   Ht 5\' 4"  (1.626 m)   Wt 174 lb 12.8 oz (79.3 kg)   SpO2 97%   BMI 30.00 kg/m  General: Awake, appears stated age Heart: RRR, no LE edema Lungs: CTAB, no  rales, wheezes or rhonchi. No accessory muscle use Abdomen: Bowel sounds+, soft, nontender, nondistended, negative Murphy's Psych: Age appropriate judgment and insight, normal affect and mood  Assessment and Plan: Esophageal dysphagia - Plan: Ambulatory referral to Gastroenterology  Unintended weight loss - Plan: Comprehensive metabolic panel, TSH, CBC w/Diff, Ambulatory referral to Gastroenterology  RUQ abdominal pain  Type 2 diabetes mellitus with hyperglycemia, without long-term current use of insulin (HCC) - Plan: Hemoglobin A1c  1/2. Refer to GI. Ck labs. Could be combo of N/V from BPPV leading into dysphagia. Also considering d/c of Clomid.  3. Trial PPI. Appreciate GI assistance.  4. Does not seem like hyperglycemic weight loss but will see.  The patient and his spouse voiced understanding and agreement to the plan.  Jilda Roche [place], [place] 08/22/23  3:38 PM

## 2023-08-22 NOTE — Patient Instructions (Addendum)
If you do not hear anything about your referral in the next 1-2 weeks, call our office and ask for an update.  Give us 2-3 business days to get the results of your labs back.   Let us know if you need anything. 

## 2023-08-23 ENCOUNTER — Other Ambulatory Visit: Payer: Self-pay | Admitting: Family Medicine

## 2023-08-23 ENCOUNTER — Other Ambulatory Visit: Payer: Self-pay

## 2023-08-23 ENCOUNTER — Encounter: Payer: Self-pay | Admitting: Family Medicine

## 2023-08-23 ENCOUNTER — Ambulatory Visit: Payer: Medicare Other

## 2023-08-23 DIAGNOSIS — R7989 Other specified abnormal findings of blood chemistry: Secondary | ICD-10-CM

## 2023-08-23 DIAGNOSIS — R634 Abnormal weight loss: Secondary | ICD-10-CM | POA: Diagnosis not present

## 2023-08-23 DIAGNOSIS — D649 Anemia, unspecified: Secondary | ICD-10-CM | POA: Diagnosis not present

## 2023-08-23 LAB — COMPREHENSIVE METABOLIC PANEL
ALT: 241 U/L — ABNORMAL HIGH (ref 0–53)
AST: 222 U/L — ABNORMAL HIGH (ref 0–37)
Albumin: 4 g/dL (ref 3.5–5.2)
Alkaline Phosphatase: 432 U/L — ABNORMAL HIGH (ref 39–117)
BUN: 14 mg/dL (ref 6–23)
CO2: 27 meq/L (ref 19–32)
Calcium: 8.8 mg/dL (ref 8.4–10.5)
Chloride: 104 meq/L (ref 96–112)
Creatinine, Ser: 0.92 mg/dL (ref 0.40–1.50)
GFR: 83.75 mL/min (ref >60.00)
Glucose, Bld: 203 mg/dL — ABNORMAL HIGH (ref 70–99)
Potassium: 4 meq/L (ref 3.5–5.1)
Sodium: 139 meq/L (ref 135–145)
Total Bilirubin: 0.8 mg/dL (ref 0.2–1.2)
Total Protein: 6.5 g/dL (ref 6.0–8.3)

## 2023-08-23 LAB — CBC WITH DIFFERENTIAL/PLATELET
Basophils Absolute: 0.1 10*3/uL (ref 0.0–0.1)
Basophils Relative: 0.8 % (ref 0.0–3.0)
Eosinophils Absolute: 0.1 10*3/uL (ref 0.0–0.7)
Eosinophils Relative: 0.8 % (ref 0.0–5.0)
HCT: 38 % — ABNORMAL LOW (ref 39.0–52.0)
Hemoglobin: 12 g/dL — ABNORMAL LOW (ref 13.0–17.0)
Lymphocytes Relative: 19.3 % (ref 12.0–46.0)
Lymphs Abs: 1.4 10*3/uL (ref 0.7–4.0)
MCHC: 31.7 g/dL (ref 30.0–36.0)
MCV: 91.9 fL (ref 78.0–100.0)
Monocytes Absolute: 0.7 10*3/uL (ref 0.1–1.0)
Monocytes Relative: 9.6 % (ref 3.0–12.0)
Neutro Abs: 5 10*3/uL (ref 1.4–7.7)
Neutrophils Relative %: 69.5 % (ref 43.0–77.0)
Platelets: 291 10*3/uL (ref 150.0–400.0)
RBC: 4.13 Mil/uL — ABNORMAL LOW (ref 4.22–5.81)
RDW: 15.8 % — ABNORMAL HIGH (ref 11.5–15.5)
WBC: 7.2 10*3/uL (ref 4.0–10.5)

## 2023-08-23 LAB — HEMOGLOBIN A1C: Hgb A1c MFr Bld: 7.6 % — ABNORMAL HIGH (ref 4.6–6.5)

## 2023-08-23 LAB — TSH: TSH: 0.72 u[IU]/mL (ref 0.35–5.50)

## 2023-08-24 ENCOUNTER — Ambulatory Visit: Payer: Medicare Other | Admitting: Gastroenterology

## 2023-08-24 ENCOUNTER — Telehealth: Payer: Self-pay | Admitting: Gastroenterology

## 2023-08-24 ENCOUNTER — Encounter: Payer: Self-pay | Admitting: Family Medicine

## 2023-08-24 ENCOUNTER — Encounter: Payer: Self-pay | Admitting: Gastroenterology

## 2023-08-24 ENCOUNTER — Other Ambulatory Visit (INDEPENDENT_AMBULATORY_CARE_PROVIDER_SITE_OTHER): Payer: Medicare Other

## 2023-08-24 VITALS — BP 118/60 | HR 67 | Ht 64.0 in | Wt 173.0 lb

## 2023-08-24 DIAGNOSIS — D649 Anemia, unspecified: Secondary | ICD-10-CM | POA: Diagnosis not present

## 2023-08-24 DIAGNOSIS — R1011 Right upper quadrant pain: Secondary | ICD-10-CM

## 2023-08-24 DIAGNOSIS — R634 Abnormal weight loss: Secondary | ICD-10-CM

## 2023-08-24 DIAGNOSIS — R1310 Dysphagia, unspecified: Secondary | ICD-10-CM

## 2023-08-24 DIAGNOSIS — R748 Abnormal levels of other serum enzymes: Secondary | ICD-10-CM | POA: Diagnosis not present

## 2023-08-24 LAB — B12 AND FOLATE PANEL
Folate: >25.2 ng/mL (ref >5.9)
Vitamin B-12: 790 pg/mL (ref 211–911)

## 2023-08-24 LAB — HEPATIC FUNCTION PANEL
ALT: 321 U/L — ABNORMAL HIGH (ref 0–53)
AST: 276 U/L — ABNORMAL HIGH (ref 0–37)
Albumin: 4.1 g/dL (ref 3.5–5.2)
Alkaline Phosphatase: 490 U/L — ABNORMAL HIGH (ref 39–117)
Bilirubin, Direct: 0.2 mg/dL (ref 0.0–0.3)
Total Bilirubin: 0.8 mg/dL (ref 0.2–1.2)
Total Protein: 7.3 g/dL (ref 6.0–8.3)

## 2023-08-24 LAB — IRON,TIBC AND FERRITIN PANEL
%SAT: 12 % — ABNORMAL LOW (ref 20–48)
Ferritin: 279 ng/mL (ref 24–380)
Iron: 42 ug/dL — ABNORMAL LOW (ref 50–180)
TIBC: 362 ug/dL (ref 250–425)

## 2023-08-24 LAB — TESTOSTERONE: Testosterone: 282.17 ng/dL — ABNORMAL LOW (ref 300.00–890.00)

## 2023-08-24 MED ORDER — OMEPRAZOLE 40 MG PO CPDR: 40.0000 mg | DELAYED_RELEASE_CAPSULE | Freq: Every day | ORAL | 3 refills | Status: DC

## 2023-08-24 NOTE — Telephone Encounter (Signed)
 Inbound call from patient stating he was not able to write down list of food that's was given by Dr. Adela Lank, to him that he is not able to eat. Patient is requesting a call to discuss list of foods. Please advise, thank you.

## 2023-08-24 NOTE — Telephone Encounter (Signed)
 Called and spoke to patient.  He wanted to know which medications Dr. Adela Lank was recommending he not take. He wanted him to hold his atorvastatin and tricor in light of the LFT elevation. Patient expressed understanding

## 2023-08-24 NOTE — Patient Instructions (Signed)
 You have been scheduled for a CT scan of the abdomen and pelvis at [facility], 1st floor Radiology. You are scheduled on Tuesday, 08-30-23 at 4:00pm. You should arrive 3:45 pm minutes prior to your appointment time for registration.    Please be well hydrated. Plan on being there 30 to 60 minutes, depending on the type of exam you are having performed.   If you have any questions regarding your exam or if you need to reschedule, you may call [facility] Scheduling at [phone] between the hours of 8:00 am and 5:00 pm, Monday-Friday.    We will cancel your appointment for RUQ Ultrasound.  Please go to the lab in the basement of our building to have lab work done as you leave today. Hit "B" for basement when you get on the elevator.  When the doors open the lab is on your left.  We will call you with the results. Thank you.  We have sent the following medications to your pharmacy for you to pick up at your convenience: Omeprazole 40 mg: Take once daily  Thank you for entrusting me with your care and for choosing [facility], Dr. Ileene Patrick    If your blood pressure at your visit was 140/90 or greater, please contact your primary care physician to follow up on this. ______________________________________________________  If you are age 72 or older, your body mass index should be between 23-30. Your Body mass index is 29.7 kg/m. If this is out of the aforementioned range listed, please consider follow up with your Primary Care Provider.  If you are age 10 or younger, your body mass index should be between 19-25. Your Body mass index is 29.7 kg/m. If this is out of the aformentioned range listed, please consider follow up with your Primary Care Provider.  ________________________________________________________  The [place] [facility] providers would like to encourage you to use [facility] to communicate with providers for non-urgent requests or questions.  Due to long  hold times on the telephone, sending your provider a message by [facility] may be a faster and more efficient way to get a response.  Please allow 48 business hours for a response.  Please remember that this is for non-urgent requests.  _______________________________________________________  Due to recent changes in healthcare laws, you may see the results of your imaging and laboratory studies on MyChart before your provider has had a chance to review them.  We understand that in some cases there may be results that are confusing or concerning to you. Not all laboratory results come back in the same time frame and the provider may be waiting for multiple results in order to interpret others.  Please give [place] 48 hours in order for your provider to thoroughly review all the results before contacting the office for clarification of your results.

## 2023-08-24 NOTE — Progress Notes (Signed)
 HPI :  73 year old male here for a follow-up visit.  I know him from prior endoscopy and colonoscopy exams.  He has had issues with loose stools in the past as well as dysphagia, secondary to esophageal stricture.  Main reason he is here today is for right upper quadrant pain.  He states this has been ongoing for a few weeks up to this point.  This is the first time he is ever had this, is very unusual for him.  He feels a stabbing sharp pain in his right upper quadrant that can come and go.  He states it is very intermittent, he will usually feel it a few times daily.  It lasts roughly seconds and then goes away on its own.  He denies any clear triggers to this.  He does not think it is necessarily preceded by eating.  He has a history of cholecystectomy.  No nausea and no vomiting.  He denies any trauma to the area.  He has had generalized weakness and fatigue over the past few months, states he has had a poor appetite with this going on but he is eating.  He has occasional NSAID use but not frequently.  He denies any problems with his bowels.  No blood in his stools.  He does not think his bowels are related to this pain.  He otherwise endorses significant weight loss over the past few months.  He has lost about 20 pounds as he states his appetite is just poor and he can eat he just has no interest to do so.  He had a CT scan of his chest for lung cancer screening recently which showed no cause for his weight loss.  His last EGD was in 2022 for dysphagia.  He had an esophageal stricture at the [facility] that was dilated at the time.  He states this worked really well to resolve his dysphagia.  He states he has very mild and rare occurrence that can bother him but generally it does not bother him much at all.  He is not really having any heartburn or reflux that bothers him currently.  Of note on routine labs by his primary care over the past month he has been found to be mildly anemic.  Hemoglobin 12.0  with an MCV of 91.  TSH normal.  Liver enzymes are newly elevated, alk phos of 432, AST 222, ALT 241, bilirubin is normal at 0.8.  This is a new elevation for him when she has not had in the past.  He denies any new medications over time.  He does take Lipitor at 80 mg daily amongst other medicines for lipids.     Prior work-up: Colonoscopy 08/31/2013 - 5mm adenoma, diverticulosis, internal hemorrhoids - repeat in 7 years recommended (08/2020)   ERCP 02/13/16 - removal of CBD stones   EGD 02/16/21: - A 1 cm hiatal hernia was present. - LA Grade A esophagitis was found 36 cm from the incisors. - One benign-appearing, intrinsic mild stenosis was found 36 cm from the incisors. This stenosis measured less than one cm (in length). A TTS dilator was passed through the scope. Dilation with an 18-19-20 mm balloon dilator was performed to 18 mm with appropriate mucosal wrents. - A single white plaque was found in the lower third of the esophagus, 35 cm from the incisors. Biopsies were taken with a cold forceps for histology. - The exam of the esophagus was otherwise normal. - The entire examined stomach was  normal. - The duodenal bulb and second portion of the duodenum were normal. Small incidentally noted diverticulum in second portion of duodenum.   Colonoscopy 02/16/21 - The perianal and digital rectal examinations were normal. - The terminal ileum appeared normal. - A 2 to 3 mm polyp was found in the ascending colon. The polyp was sessile. The polyp was removed with a cold biopsy forceps. Resection and retrieval were complete. - Multiple medium-mouthed diverticula were found in the entire colon. - Internal hemorrhoids were found during retroflexion with stigmata of prior banding (scarring noted). - The exam was otherwise without abnormality. No overt inflammatory changes noted. - Biopsies for histology were taken with a cold forceps from the right colon, left colon and transverse colon for  evaluation of microscopic colitis.   1. Surgical [P], lower esophagus bx - ESOPHAGEAL SQUAMOUS MUCOSA WITH MILD VASCULAR CONGESTION, AND FOCAL SQUAMOUS BALLOONING, SUGGESTIVE OF REFLUX ESOPHAGITIS - NEGATIVE FOR INCREASED INTRAEPITHELIAL EOSINOPHILS 2. Surgical [P], colon nos, random sites - COLONIC MUCOSA WITH NO SPECIFIC HISTOPATHOLOGIC CHANGES - NEGATIVE FOR ACUTE INFLAMMATION, INCREASED INTRAEPITHELIAL LYMPHOCYTES OR THICKENED SUBEPITHELIAL COLLAGEN TABLE 3. Surgical [P], colon, ascending polyp x1, polyp (1) - TUBULAR ADENOMA - NEGATIVE FOR HIGH-GRADE DYSPLASIA OR MALIGNANCY       Past Medical History:  Diagnosis Date   Adenomatous colon polyp    tubular   CAD (coronary artery disease)    a. Cath 06/08/01 at [facility] with normal LM and LAD, 95% LCx s/p Circumflex stent 4.0 x 15 mm Penta and 85% and 80% prox RCA s/p 3.5 x 23 mm Penta b. cath 01/20/2015 95% prox LCx ISR treated with DES, 55% prox to mid RCA, 45% distal RCA, 40% midLAD, EF 55%     Cataract    Diabetes mellitus without complication (HCC)    Diarrhea    Diverticulosis    Erectile dysfunction    Gallstones    Hyperlipidemia    Hypertension    Myocardial infarction [facility])      Past Surgical History:  Procedure Laterality Date   ANGIOPLASTY  2002   2 stents   CARDIAC CATHETERIZATION N/A 01/20/2015   Procedure: Left Heart Cath and Coronary Angiography;  Surgeon: Lyn Records, MD;  Location: [facility] INVASIVE CV LAB;  Service: Cardiovascular;  Laterality: N/A;   CARDIAC CATHETERIZATION N/A 01/27/2016   Procedure: Left Heart Cath and Coronary Angiography;  Surgeon: Tonny Bollman, MD;  Location: [facility] INVASIVE CV LAB;  Service: Cardiovascular;  Laterality: N/A;   CHOLECYSTECTOMY N/A 02/09/2016   Procedure: LAPAROSCOPIC CHOLECYSTECTOMY WITH INTRAOPERATIVE CHOLANGIOGRAM;  Surgeon: Harriette Bouillon, MD;  Location: [facility] OR;  Service: General;  Laterality: N/A;   COLONOSCOPY W/ POLYPECTOMY  08/2013   Avg risk screening, Dr Walker Kehr. 5 mm tubular adenoma ascending.  Mild to moderated descending and sigmoid tics.  Internal hemorrhoids   CORONARY ANGIOPLASTY WITH STENT PLACEMENT     ERCP N/A 02/13/2016   Procedure: ENDOSCOPIC RETROGRADE CHOLANGIOPANCREATOGRAPHY (ERCP);  Surgeon: Sherrilyn Rist, MD;  Location: [facility] ENDOSCOPY;  Service: Gastroenterology;  Laterality: N/A;   ROTATOR CUFF REPAIR Right 2009   ROTATOR CUFF REPAIR Left 2014   TRIGGER FINGER RELEASE Bilateral 2013   4 fingers, middle finger on both hands   Family History  Problem Relation Age of Onset   Dementia Mother    Crohn's disease Mother    Heart disease Father 71       Valve replacement and CAD, CABG   Diabetes Father    CAD Sister  83   CAD Brother 63       Died age 28   Diabetes Brother    Kidney disease Brother    Colon cancer Neg Hx    Esophageal cancer Neg Hx    Stomach cancer Neg Hx    Rectal cancer Neg Hx    Pancreatic cancer Neg Hx    Social History   Tobacco Use   Smoking status: Former    Current packs/day: 0.00    Average packs/day: 1 pack/day for 45.0 years (45.0 ttl pk-yrs)    Types: Cigarettes    Start date: 01/16/1970    Quit date: 01/17/2015    Years since quitting: 8.6   Smokeless tobacco: Never  Vaping Use   Vaping status: Never Used  Substance Use Topics   Alcohol use: Not Currently    Alcohol/week: 1.0 standard drink of alcohol    Types: 1 Glasses of wine per week    Comment: daily   Drug use: No   Current Outpatient Medications  Medication Sig Dispense Refill   Accu-Chek FastClix Lancets MISC CHECK BLOOD SUGAR ONCE OR  TWICE WEEKLY 102 each 3   amLODipine (NORVASC) 5 MG tablet TAKE 1 TABLET BY MOUTH DAILY 90 tablet 3   atorvastatin (LIPITOR) 80 MG tablet TAKE 1 TABLET BY MOUTH AT  BEDTIME 90 tablet 3   Continuous Glucose Sensor (FREESTYLE LIBRE 3 SENSOR) MISC Use to check blood sugar 1 each 1   ezetimibe (ZETIA) 10 MG tablet Take 1 tablet by mouth once daily 90 tablet 1   fenofibrate (TRICOR) 48 MG  tablet TAKE 1 TABLET BY MOUTH DAILY 90 tablet 3   glucose blood (ACCU-CHEK GUIDE) test strip Use daily to check blood sugar.  DX E11.65 100 each 3   Krill Oil 500 MG CAPS Take 500 mg by mouth 2 (two) times daily.     metFORMIN (GLUCOPHAGE-XR) 500 MG 24 hr tablet Take 2 tablets (1,000 mg total) by mouth daily with breakfast.     metoprolol tartrate (LOPRESSOR) 25 MG tablet Take 1 tablet (25 mg total) by mouth 2 (two) times daily. 180 tablet 3   Multiple Vitamin (MULTIVITAMIN WITH MINERALS) TABS tablet Take 1 tablet by mouth daily.     MYRBETRIQ 50 MG TB24 tablet Take 50 mg by mouth daily.     nitroGLYCERIN (NITROSTAT) 0.4 MG SL tablet Place 1 tablet (0.4 mg total) under the tongue every 5 (five) minutes as needed for chest pain. 25 tablet 3   omeprazole (PRILOSEC) 40 MG capsule Take 1 capsule (40 mg total) by mouth daily before breakfast. 30 capsule 3   pioglitazone (ACTOS) 45 MG tablet Take 1 tablet (45 mg total) by mouth daily. 90 tablet 3   ramipril (ALTACE) 10 MG capsule TAKE 1 CAPSULE BY MOUTH TWICE  DAILY 180 capsule 3   tadalafil (CIALIS) 5 MG tablet Take 1 tablet (5 mg total) by mouth daily. 30 tablet 11   traZODone (DESYREL) 50 MG tablet TAKE 1 TABLET BY MOUTH AT  BEDTIME AS NEEDED FOR SLEEP 90 tablet 3   No current facility-administered medications for this visit.   Allergies  Allergen Reactions   Protonix [Pantoprazole] Other (See Comments)    Exacerbated reflux s/s's     Review of Systems: All systems reviewed and negative except where noted in HPI.    DG Chest Port 1 View Result Date: 07/30/2023 CLINICAL DATA:  Severe fatigue, facial rash and decreased appetite EXAM: PORTABLE CHEST 1 VIEW COMPARISON:  02/08/2016  FINDINGS: Stable cardiomediastinal silhouette. Aortic atherosclerotic calcification. Bibasilar atelectasis or infiltrates. No pleural effusion or pneumothorax. IMPRESSION: Bibasilar atelectasis or infiltrates. Electronically Signed   By: Minerva Fester M.D.   On:  07/30/2023 22:28   Lab Results  Component Value Date   ALT 241 (H) 08/22/2023   AST 222 (H) 08/22/2023   ALKPHOS 432 (H) 08/22/2023   BILITOT 0.8 08/22/2023    Lab Results  Component Value Date   NA 139 08/22/2023   CL 104 08/22/2023   K 4.0 08/22/2023   CO2 27 08/22/2023   BUN 14 08/22/2023   CREATININE 0.92 08/22/2023   GFR 83.75 08/22/2023   CALCIUM 8.8 08/22/2023   ALBUMIN 4.0 08/22/2023   GLUCOSE 203 (H) 08/22/2023    Lab Results  Component Value Date   WBC 7.2 08/22/2023   HGB 12.0 (L) 08/22/2023   HCT 38.0 (L) 08/22/2023   MCV 91.9 08/22/2023   PLT 291.0 08/22/2023     Physical Exam: BP 118/60   Pulse 67   Ht 5\' 4"  (1.626 m)   Wt 173 lb (78.5 kg)   BMI 29.70 kg/m  Constitutional: Pleasant,well-developed, male in no acute distress. Abdominal: Soft, nondistended, nontender. Negative Carnett sign.   There are no masses palpable.  Neurological: Alert and oriented to person place and time. Psychiatric: Normal mood and affect. Behavior is normal.   ASSESSMENT: 73 y.o. male here for assessment of the following  1. RUQ abdominal pain   2. Elevated liver enzymes   3. Loss of weight   4. Anemia, unspecified type   5. Dysphagia, unspecified type    Intermittent right upper quadrant pain that is quite fleeting and self-limited for the past few weeks.  Over the past month he has had rather significant weight loss as well as nonspecific fatigue.  His liver enzymes are newly elevated as of 2 days ago, compared to his prior baseline.  No new medications he is taking to be causing this.  He also has an anemia which is new, normocytic.  Iron studies sent and pending.  He was initially scheduled for a right upper quadrant ultrasound however I think given his weight loss and to completely see his entire liver and biliary tree would recommend a CT scan abdomen pelvis.  He is agree with switching this order.  I am going to repeat his liver enzymes today, if this is  persistent and if his CT scan shows no clear cause, he will need further serologic evaluation for this.  Will await his initial repeat LFTs first.  While he is at the lab will check B12 folate, and testosterone level in light of his anemia and fatigue, make sure okay there.  We are also awaiting pending iron studies. Given his elevated liver enzymes I recommend holding or reducing his Lipitor to at least half tab daily for now, and holding the Tricor.  I also prescribed him some empiric omeprazole 40 mg daily to see if that will make any difference in his abdominal pain, however again most concerned about some process of involving his liver given his elevated liver enzymes.  He does have dysphagia but mild intermittent and significantly improved from prior baseline.  I do not think he needs an EGD right now, would reserve that for recurrent significant dysphagia or persistent abdominal pain for which a clear etiology has not been identified on workup otherwise. He agrees.   PLAN: - change RUQ [place] to CT scan abdomen pelvis - lab today -  repeat LFTs, add B12, folate, testosterone - iron studies pending - start omeprazole 40mg  / day - may need further serologic workup for liver enzymes elevation if CT negative - hold Tricor and lipitor for now - consideration for EGD pending his course - dysphagia quite mild and not bothersome but if pain persists without clear cause otherwise will consider it  Harlin Rain, MD [facility]  CC: Sharlene Dory*

## 2023-08-24 NOTE — Telephone Encounter (Signed)
 No I did not recommend to restrict any certain foods. I told him to hold his atorvastatin and tricor in light of the LFT elevation. Thanks

## 2023-08-25 ENCOUNTER — Other Ambulatory Visit: Payer: Self-pay | Admitting: Family Medicine

## 2023-08-25 ENCOUNTER — Telehealth: Payer: Self-pay | Admitting: Gastroenterology

## 2023-08-25 DIAGNOSIS — D649 Anemia, unspecified: Secondary | ICD-10-CM

## 2023-08-25 DIAGNOSIS — R7989 Other specified abnormal findings of blood chemistry: Secondary | ICD-10-CM

## 2023-08-25 DIAGNOSIS — R748 Abnormal levels of other serum enzymes: Secondary | ICD-10-CM

## 2023-08-25 DIAGNOSIS — R634 Abnormal weight loss: Secondary | ICD-10-CM

## 2023-08-25 DIAGNOSIS — R1011 Right upper quadrant pain: Secondary | ICD-10-CM

## 2023-08-25 NOTE — Telephone Encounter (Signed)
 Called and spoke to patient.  He understands he has to make an appointment to go to the lab on Monday.

## 2023-08-25 NOTE — Telephone Encounter (Signed)
 PT is calling to find out if he needs to come in for lab work. Please advise.

## 2023-08-25 NOTE — Telephone Encounter (Signed)
 See result notes.  Patient needs labs on Monday.  Left him a message that we will put those in in such a way that Dr. Hollie Beach office should be able to draw them on Monday.  Called Dr. Hollie Beach office. They indicated they have to be entered as "clinic collect" and [place] [facility] or [facility]. Patient will need to call and schedule an appointment

## 2023-08-26 ENCOUNTER — Inpatient Hospital Stay (HOSPITAL_COMMUNITY)
Admission: EM | Admit: 2023-08-26 | Discharge: 2023-08-30 | DRG: 446 | Disposition: A | Discharge status: Home / Self Care | Payer: Medicare Other | Attending: Internal Medicine | Admitting: Internal Medicine

## 2023-08-26 ENCOUNTER — Emergency Department (HOSPITAL_COMMUNITY): Payer: Medicare Other

## 2023-08-26 ENCOUNTER — Encounter (HOSPITAL_COMMUNITY): Payer: Self-pay

## 2023-08-26 ENCOUNTER — Other Ambulatory Visit: Payer: Self-pay

## 2023-08-26 ENCOUNTER — Telehealth: Payer: Self-pay

## 2023-08-26 DIAGNOSIS — R978 Other abnormal tumor markers: Secondary | ICD-10-CM | POA: Diagnosis not present

## 2023-08-26 DIAGNOSIS — E119 Type 2 diabetes mellitus without complications: Secondary | ICD-10-CM | POA: Diagnosis not present

## 2023-08-26 DIAGNOSIS — I700 Atherosclerosis of aorta: Secondary | ICD-10-CM | POA: Diagnosis present

## 2023-08-26 DIAGNOSIS — K831 Obstruction of bile duct: Secondary | ICD-10-CM | POA: Diagnosis present

## 2023-08-26 DIAGNOSIS — R935 Abnormal findings on diagnostic imaging of other abdominal regions, including retroperitoneum: Secondary | ICD-10-CM

## 2023-08-26 DIAGNOSIS — Z1152 Encounter for screening for COVID-19: Secondary | ICD-10-CM | POA: Diagnosis not present

## 2023-08-26 DIAGNOSIS — I252 Old myocardial infarction: Secondary | ICD-10-CM | POA: Diagnosis not present

## 2023-08-26 DIAGNOSIS — B9620 Unspecified Escherichia coli [E. coli] as the cause of diseases classified elsewhere: Secondary | ICD-10-CM | POA: Diagnosis present

## 2023-08-26 DIAGNOSIS — R1011 Right upper quadrant pain: Secondary | ICD-10-CM | POA: Diagnosis present

## 2023-08-26 DIAGNOSIS — K5730 Diverticulosis of large intestine without perforation or abscess without bleeding: Secondary | ICD-10-CM | POA: Diagnosis not present

## 2023-08-26 DIAGNOSIS — R932 Abnormal findings on diagnostic imaging of liver and biliary tract: Secondary | ICD-10-CM | POA: Diagnosis not present

## 2023-08-26 DIAGNOSIS — K219 Gastro-esophageal reflux disease without esophagitis: Secondary | ICD-10-CM | POA: Diagnosis not present

## 2023-08-26 DIAGNOSIS — Z8249 Family history of ischemic heart disease and other diseases of the circulatory system: Secondary | ICD-10-CM | POA: Diagnosis not present

## 2023-08-26 DIAGNOSIS — W19XXXA Unspecified fall, initial encounter: Secondary | ICD-10-CM | POA: Diagnosis present

## 2023-08-26 DIAGNOSIS — E782 Mixed hyperlipidemia: Secondary | ICD-10-CM | POA: Diagnosis present

## 2023-08-26 DIAGNOSIS — K8309 Other cholangitis: Secondary | ICD-10-CM | POA: Diagnosis not present

## 2023-08-26 DIAGNOSIS — K5710 Diverticulosis of small intestine without perforation or abscess without bleeding: Secondary | ICD-10-CM | POA: Diagnosis not present

## 2023-08-26 DIAGNOSIS — N529 Male erectile dysfunction, unspecified: Secondary | ICD-10-CM | POA: Diagnosis present

## 2023-08-26 DIAGNOSIS — K769 Liver disease, unspecified: Secondary | ICD-10-CM | POA: Diagnosis not present

## 2023-08-26 DIAGNOSIS — K8041 Calculus of bile duct with cholecystitis, unspecified, with obstruction: Principal | ICD-10-CM | POA: Diagnosis present

## 2023-08-26 DIAGNOSIS — N400 Enlarged prostate without lower urinary tract symptoms: Secondary | ICD-10-CM | POA: Diagnosis present

## 2023-08-26 DIAGNOSIS — R160 Hepatomegaly, not elsewhere classified: Secondary | ICD-10-CM | POA: Diagnosis not present

## 2023-08-26 DIAGNOSIS — Z955 Presence of coronary angioplasty implant and graft: Secondary | ICD-10-CM

## 2023-08-26 DIAGNOSIS — K838 Other specified diseases of biliary tract: Secondary | ICD-10-CM | POA: Diagnosis not present

## 2023-08-26 DIAGNOSIS — K8050 Calculus of bile duct without cholangitis or cholecystitis without obstruction: Secondary | ICD-10-CM

## 2023-08-26 DIAGNOSIS — B001 Herpesviral vesicular dermatitis: Secondary | ICD-10-CM | POA: Diagnosis present

## 2023-08-26 DIAGNOSIS — Z9889 Other specified postprocedural states: Secondary | ICD-10-CM | POA: Diagnosis not present

## 2023-08-26 DIAGNOSIS — R7401 Elevation of levels of liver transaminase levels: Secondary | ICD-10-CM | POA: Diagnosis not present

## 2023-08-26 DIAGNOSIS — Z841 Family history of disorders of kidney and ureter: Secondary | ICD-10-CM

## 2023-08-26 DIAGNOSIS — Z833 Family history of diabetes mellitus: Secondary | ICD-10-CM

## 2023-08-26 DIAGNOSIS — Z87891 Personal history of nicotine dependence: Secondary | ICD-10-CM

## 2023-08-26 DIAGNOSIS — I10 Essential (primary) hypertension: Secondary | ICD-10-CM | POA: Diagnosis not present

## 2023-08-26 DIAGNOSIS — Z860101 Personal history of adenomatous and serrated colon polyps: Secondary | ICD-10-CM

## 2023-08-26 DIAGNOSIS — R7989 Other specified abnormal findings of blood chemistry: Secondary | ICD-10-CM

## 2023-08-26 DIAGNOSIS — Z79899 Other long term (current) drug therapy: Secondary | ICD-10-CM | POA: Diagnosis not present

## 2023-08-26 DIAGNOSIS — I2510 Atherosclerotic heart disease of native coronary artery without angina pectoris: Secondary | ICD-10-CM | POA: Diagnosis present

## 2023-08-26 DIAGNOSIS — E1165 Type 2 diabetes mellitus with hyperglycemia: Secondary | ICD-10-CM | POA: Diagnosis not present

## 2023-08-26 DIAGNOSIS — R509 Fever, unspecified: Secondary | ICD-10-CM | POA: Diagnosis present

## 2023-08-26 DIAGNOSIS — Z7984 Long term (current) use of oral hypoglycemic drugs: Secondary | ICD-10-CM

## 2023-08-26 DIAGNOSIS — Z888 Allergy status to other drugs, medicaments and biological substances status: Secondary | ICD-10-CM | POA: Diagnosis not present

## 2023-08-26 DIAGNOSIS — Z9049 Acquired absence of other specified parts of digestive tract: Secondary | ICD-10-CM | POA: Diagnosis not present

## 2023-08-26 LAB — COMPREHENSIVE METABOLIC PANEL
ALT: 489 U/L — ABNORMAL HIGH (ref 0–44)
AST: 476 U/L — ABNORMAL HIGH (ref 15–41)
Albumin: 3.6 g/dL (ref 3.5–5.0)
Alkaline Phosphatase: 550 U/L — ABNORMAL HIGH (ref 38–126)
Anion gap: 13 (ref 5–15)
BUN: 11 mg/dL (ref 8–23)
CO2: 20 mmol/L — ABNORMAL LOW (ref 22–32)
Calcium: 9 mg/dL (ref 8.9–10.3)
Chloride: 101 mmol/L (ref 98–111)
Creatinine, Ser: 0.65 mg/dL (ref 0.61–1.24)
GFR, Estimated: >60 mL/min (ref >60)
Glucose, Bld: 187 mg/dL — ABNORMAL HIGH (ref 70–99)
Potassium: 3.7 mmol/L (ref 3.5–5.1)
Sodium: 134 mmol/L — ABNORMAL LOW (ref 135–145)
Total Bilirubin: 5.8 mg/dL — ABNORMAL HIGH (ref 0.0–1.2)
Total Protein: 7.3 g/dL (ref 6.5–8.1)

## 2023-08-26 LAB — AMMONIA: Ammonia: 72 umol/L — ABNORMAL HIGH (ref 9–35)

## 2023-08-26 LAB — I-STAT CG4 LACTIC ACID, ED: Lactic Acid, Venous: 1.5 mmol/L (ref 0.5–1.9)

## 2023-08-26 LAB — URINALYSIS, ROUTINE W REFLEX MICROSCOPIC
Glucose, UA: 50 mg/dL — AB
Hgb urine dipstick: NEGATIVE
Ketones, ur: NEGATIVE mg/dL
Leukocytes,Ua: NEGATIVE
Nitrite: NEGATIVE
Protein, ur: NEGATIVE mg/dL
Specific Gravity, Urine: 1.017 (ref 1.005–1.030)
pH: 6 (ref 5.0–8.0)

## 2023-08-26 LAB — CBC
HCT: 40 % (ref 39.0–52.0)
Hemoglobin: 12.7 g/dL — ABNORMAL LOW (ref 13.0–17.0)
MCH: 29.1 pg (ref 26.0–34.0)
MCHC: 31.8 g/dL (ref 30.0–36.0)
MCV: 91.7 fL (ref 80.0–100.0)
Platelets: 255 10*3/uL (ref 150–400)
RBC: 4.36 MIL/uL (ref 4.22–5.81)
RDW: 16.1 % — ABNORMAL HIGH (ref 11.5–15.5)
WBC: 10.5 10*3/uL (ref 4.0–10.5)
nRBC: 0 % (ref 0.0–0.2)

## 2023-08-26 LAB — RESP PANEL BY RT-PCR (RSV, FLU A&B, COVID)  RVPGX2
Influenza A by PCR: NEGATIVE
Influenza B by PCR: NEGATIVE
Resp Syncytial Virus by PCR: NEGATIVE
SARS Coronavirus 2 by RT PCR: NEGATIVE

## 2023-08-26 LAB — LIPASE, BLOOD: Lipase: 47 U/L (ref 11–51)

## 2023-08-26 MED ORDER — ONDANSETRON 4 MG PO TBDP
4.0000 mg | ORAL_TABLET | Freq: Once | ORAL | Status: AC | PRN
  Administered 2023-08-26: 4 mg via ORAL

## 2023-08-26 MED ORDER — ONDANSETRON HCL 4 MG/2ML IJ SOLN
4.0000 mg | Freq: Once | INTRAMUSCULAR | Status: AC
  Administered 2023-08-26: 4 mg via INTRAVENOUS
  Filled 2023-08-26: qty 2

## 2023-08-26 MED ORDER — SODIUM CHLORIDE 0.9% FLUSH
3.0000 mL | Freq: Two times a day (BID) | INTRAVENOUS | Status: DC
  Administered 2023-08-27 – 2023-08-30 (×5): 3 mL via INTRAVENOUS

## 2023-08-26 MED ORDER — IOHEXOL 300 MG/ML  SOLN
100.0000 mL | Freq: Once | INTRAMUSCULAR | Status: AC | PRN
  Administered 2023-08-26: 100 mL via INTRAVENOUS

## 2023-08-26 MED ORDER — LACTATED RINGERS IV BOLUS
1000.0000 mL | Freq: Once | INTRAVENOUS | Status: AC
  Administered 2023-08-26: 1000 mL via INTRAVENOUS

## 2023-08-26 MED ORDER — PIPERACILLIN-TAZOBACTAM 4.5 G IVPB: 4.5000 g | Freq: Once | INTRAVENOUS | Status: DC

## 2023-08-26 MED ORDER — MORPHINE SULFATE (PF) 2 MG/ML IV SOLN: 1.0000 mg | INTRAVENOUS | Status: DC | PRN

## 2023-08-26 MED ORDER — ONDANSETRON HCL 4 MG/2ML IJ SOLN: 4.0000 mg | Freq: Four times a day (QID) | INTRAMUSCULAR | Status: DC | PRN

## 2023-08-26 MED ORDER — PIPERACILLIN-TAZOBACTAM 3.375 G IVPB 30 MIN
3.3750 g | Freq: Once | INTRAVENOUS | Status: AC
  Administered 2023-08-26: 3.375 g via INTRAVENOUS
  Filled 2023-08-26: qty 50

## 2023-08-26 MED ORDER — LACTATED RINGERS IV SOLN: INTRAVENOUS | Status: AC

## 2023-08-26 MED ORDER — KETOROLAC TROMETHAMINE 15 MG/ML IJ SOLN
15.0000 mg | Freq: Once | INTRAMUSCULAR | Status: AC
  Administered 2023-08-26: 15 mg via INTRAVENOUS
  Filled 2023-08-26: qty 1

## 2023-08-26 MED ORDER — SODIUM CHLORIDE 0.9 % IV BOLUS
500.0000 mL | Freq: Once | INTRAVENOUS | Status: AC
  Administered 2023-08-26: 500 mL via INTRAVENOUS

## 2023-08-26 MED ORDER — ONDANSETRON HCL 4 MG PO TABS: 4.0000 mg | ORAL_TABLET | Freq: Four times a day (QID) | ORAL | Status: DC | PRN

## 2023-08-26 MED ORDER — ACETAMINOPHEN 325 MG PO TABS
650.0000 mg | ORAL_TABLET | Freq: Once | ORAL | Status: DC | PRN
  Filled 2023-08-26: qty 2

## 2023-08-26 MED ORDER — IBUPROFEN 200 MG PO TABS: 400.0000 mg | ORAL_TABLET | Freq: Four times a day (QID) | ORAL | Status: DC | PRN

## 2023-08-26 MED ORDER — INSULIN ASPART 100 UNIT/ML IJ SOLN
0.0000 [IU] | INTRAMUSCULAR | Status: DC
  Administered 2023-08-27: 1 [IU] via SUBCUTANEOUS
  Administered 2023-08-27: 2 [IU] via SUBCUTANEOUS
  Administered 2023-08-27: 3 [IU] via SUBCUTANEOUS
  Filled 2023-08-26: qty 0.09

## 2023-08-26 NOTE — ED Notes (Signed)
 Pt's mental status declined and he is altered at this time. Unable to admin PO meds. EDP notified.

## 2023-08-26 NOTE — ED Triage Notes (Signed)
 Pt presents with a 1 month history of RUQ abd pain. Today pt fell out of bed and is having increased generalized weakness, N/V. Pt has GI that has been following him because he has had elevated LFTs and was supposed to have a CT on 3/3.

## 2023-08-26 NOTE — Telephone Encounter (Signed)
 DOD Patient of Dr Adela Lank last seen 2 days ago with abdominal pain.  Amil Amen calls from her work with, with urgent concerns about the patient. The patient is at home alone. She called to check on him. The patient has told her he has severe abdominal pain, he fell out of the bed and was in the floor for 3 hours unable to get up. He has not eaten today, has not gotten out of the bed except to "shuffle to the bathroom" and feels too weak to call for help. She wants to know what she should do. I told her based on what she is telling me, I suggest she get him to an emergency room for evaluation. She has decided she will go to him and see if she can get him in the car. Otherwise, she will call EMS for help. Do you agree?

## 2023-08-26 NOTE — Hospital Course (Signed)
 Troy Boyer is a 73 y.o. male with medical history significant for CAD s/p prior stenting, T2DM, HTN, HLD, s/p cholecystectomy, Hx of choledocholithiasis who is admitted with biliary obstruction suspected due to choledocholithiasis versus soft tissue neoplasm.

## 2023-08-26 NOTE — H&P (Signed)
 History and Physical    Troy Boyer:[id number] DOB: 1951/04/14 DOA: 08/26/2023  PCP: Sharlene Dory, DO  Patient coming from: Home  I have personally briefly reviewed patient's old medical records in [facility]  Chief Complaint: Right upper quadrant abdominal pain  HPI: Troy Boyer is a 73 y.o. male with medical history significant for CAD s/p prior stenting, T2DM, HTN, HLD, s/p cholecystectomy, Hx of choledocholithiasis who presented to the ED for evaluation of right upper quadrant abdominal pain.  Patient reports over the last month feeling unwell, initially with vertigo and fatigue.  2 weeks ago he began to have intermittent right upper quadrant abdominal pain.  No obvious triggers for his pain.  He was seen by his PCP earlier this week and was found to have newly elevated LFTs.  He was referred to GI and was seen in office by Dr. Adela Lank on 2/26.  An outpatient CT A/P was scheduled but not yet performed.    Today patient had significantly worsening and persistent right upper quadrant pain.  This is associated with nausea and vomiting.  He says he has been waking up with drenching sweats.  He says he has lost about 20 pounds over the last month.  ED Course  Labs/Imaging on admission: I have personally reviewed following labs and imaging studies.  Initial vitals showed BP 149/74, pulse 90, RR 18, temp 100.2 F, SpO2 95% on room air.  Tmax 105.4 F rectally.  Labs show AST 476, ALT 489, alk phos 550, total bilirubin 5.8, lipase 47, sodium 134, potassium 3.7, bicarb 20, BUN 11, creatinine 0.65, serum glucose 187, ammonia 72, lactic acid 1.5 WBC 10.5, hemoglobin 12.7, platelets 255,000.  UA negative for UTI.  Blood cultures in process.  SARS-CoV-2, influenza, RSV PCR negative.  CT abdomen/pelvis with contrast IMPRESSION: 1. Interval development, since 07/22/2023, of moderate biliary duct dilatation secondary to soft tissue density within the distal common duct.  Most likely noncalcified choledocholithiasis. A soft tissue neoplasm such as extrahepatic cholangiocarcinoma could look similar but is felt less likely. Recommend further evaluation with ERCP. 2. Right hepatic lobe hypoattenuating lesion is indeterminate. Correlate with any history of primary malignancy. Consider further evaluation with outpatient, nonemergent pre and post contrast abdominal MRI. 3. Cholecystectomy. Hypoattenuation in the region of the operative bed could represent focal hepatic steatosis and/or a gallbladder/cystic duct remnant. No surrounding inflammation to suggest acute clinical significance. 4. Incidental findings, including: Coronary artery atherosclerosis. Aortic Atherosclerosis (ICD10-I70.0). Mitral and aortic valvular calcifications for which echocardiography should be considered. Prostatomegaly.  Patient was given 1.5 L IV fluids, IV Zosyn, Toradol.  EDP spoke with on-call [facility] Dr. Marina Goodell who recommended medical admission, n.p.o. after midnight for possible ERCP tomorrow.  The hospitalist service was consulted to admit.  Review of Systems: All systems reviewed and are negative except as documented in history of present illness above.   Past Medical History:  Diagnosis Date   Adenomatous colon polyp    tubular   CAD (coronary artery disease)    a. Cath 06/08/01 at [facility] with normal LM and LAD, 95% LCx s/p Circumflex stent 4.0 x 15 mm Penta and 85% and 80% prox RCA s/p 3.5 x 23 mm Penta b. cath 01/20/2015 95% prox LCx ISR treated with DES, 55% prox to mid RCA, 45% distal RCA, 40% midLAD, EF 55%     Cataract    Diabetes mellitus without complication (HCC)    Diarrhea    Diverticulosis    Erectile dysfunction  Gallstones    Hyperlipidemia    Hypertension    Myocardial infarction [facility])     Past Surgical History:  Procedure Laterality Date   ANGIOPLASTY  2002   2 stents   CARDIAC CATHETERIZATION N/A 01/20/2015   Procedure: Left Heart Cath and Coronary  Angiography;  Surgeon: Lyn Records, MD;  Location: [facility] INVASIVE CV LAB;  Service: Cardiovascular;  Laterality: N/A;   CARDIAC CATHETERIZATION N/A 01/27/2016   Procedure: Left Heart Cath and Coronary Angiography;  Surgeon: Tonny Bollman, MD;  Location[facility] INVASIVE CV LAB;  Service: Cardiovascular;  Laterality: N/A;   CHOLECYSTECTOMY N/A 02/09/2016   Procedure: LAPAROSCOPIC CHOLECYSTECTOMY WITH INTRAOPERATIVE CHOLANGIOGRAM;  Surgeon: Harriette Bouillon, MD;  Location: [facility] OR;  Service: General;  Laterality: N/A;   COLONOSCOPY W/ POLYPECTOMY  08/2013   Avg risk screening, Dr Walker Kehr. 5 mm tubular adenoma ascending.  Mild to moderated descending and sigmoid tics.  Internal hemorrhoids   CORONARY ANGIOPLASTY WITH STENT PLACEMENT     ERCP N/A 02/13/2016   Procedure: ENDOSCOPIC RETROGRADE CHOLANGIOPANCREATOGRAPHY (ERCP);  Surgeon: Sherrilyn Rist, MD;  Location: [facility] ENDOSCOPY;  Service: Gastroenterology;  Laterality: N/A;   ROTATOR CUFF REPAIR Right 2009   ROTATOR CUFF REPAIR Left 2014   TRIGGER FINGER RELEASE Bilateral 2013   4 fingers, middle finger on both hands    Social History:  reports that he quit smoking about 8 years ago. His smoking use included cigarettes. He started smoking about 53 years ago. He has a 45 pack-year smoking history. He has never used smokeless tobacco. He reports that he does not currently use alcohol after a past usage of about 1.0 standard drink of alcohol per week. He reports that he does not use drugs.  Allergies  Allergen Reactions   Protonix [Pantoprazole] Other (See Comments)    Exacerbated reflux s/s's    Family History  Problem Relation Age of Onset   Dementia Mother    Crohn's disease Mother    Heart disease Father 80       Valve replacement and CAD, CABG   Diabetes Father    CAD Sister 24   CAD Brother 42       Died age 39   Diabetes Brother    Kidney disease Brother    Colon cancer Neg Hx    Esophageal cancer Neg Hx    Stomach cancer Neg Hx     Rectal cancer Neg Hx    Pancreatic cancer Neg Hx      Prior to Admission medications   Medication Sig Start Date End Date Taking? Authorizing Provider  Accu-Chek FastClix Lancets MISC CHECK BLOOD SUGAR ONCE OR  TWICE WEEKLY 04/25/23   Sharlene Dory, DO  amLODipine (NORVASC) 5 MG tablet TAKE 1 TABLET BY MOUTH DAILY 12/31/22   Sharlene Dory, DO  atorvastatin (LIPITOR) 80 MG tablet TAKE 1 TABLET BY MOUTH AT  BEDTIME 12/31/22   Wendling, Jilda Roche, DO  Continuous Glucose Sensor (FREESTYLE LIBRE 3 SENSOR) MISC Use to check blood sugar 01/26/23   Wendling, Jilda Roche, DO  ezetimibe (ZETIA) 10 MG tablet Take 1 tablet by mouth once daily 08/09/23   Lewayne Bunting, MD  fenofibrate (TRICOR) 48 MG tablet TAKE 1 TABLET BY MOUTH DAILY 11/18/22   Sharlene Dory, DO  glucose blood (ACCU-CHEK GUIDE) test strip Use daily to check blood sugar.  DX E11.65 04/25/23   Sharlene Dory, DO  Krill Oil 500 MG CAPS Take 500 mg by mouth 2 (  two) times daily.    [provider]  metFORMIN (GLUCOPHAGE-XR) 500 MG 24 hr tablet Take 2 tablets (1,000 mg total) by mouth daily with breakfast. 05/11/23   Wendling, Jilda Roche, DO  metoprolol tartrate (LOPRESSOR) 25 MG tablet Take 1 tablet (25 mg total) by mouth 2 (two) times daily. 11/24/22   Azalee Course, PA  Multiple Vitamin (MULTIVITAMIN WITH MINERALS) TABS tablet Take 1 tablet by mouth daily.    [provider]  MYRBETRIQ 50 MG TB24 tablet Take 50 mg by mouth daily. 04/10/23   [provider]  nitroGLYCERIN (NITROSTAT) 0.4 MG SL tablet Place 1 tablet (0.4 mg total) under the tongue every 5 (five) minutes as needed for chest pain. 04/26/22   Sharlene Dory, DO  omeprazole (PRILOSEC) 40 MG capsule Take 1 capsule (40 mg total) by mouth daily before breakfast. 08/24/23   Armbruster, Willaim Rayas, MD  pioglitazone (ACTOS) 45 MG tablet Take 1 tablet (45 mg total) by mouth daily. 05/11/23   Sharlene Dory,  DO  ramipril (ALTACE) 10 MG capsule TAKE 1 CAPSULE BY MOUTH TWICE  DAILY 12/06/22   Lewayne Bunting, MD  tadalafil (CIALIS) 5 MG tablet Take 1 tablet (5 mg total) by mouth daily. 10/19/21   Sharlene Dory, DO  traZODone (DESYREL) 50 MG tablet TAKE 1 TABLET BY MOUTH AT  BEDTIME AS NEEDED FOR SLEEP 10/14/22   Sharlene Dory, DO    Physical Exam: Vitals:   08/26/23 2145 08/26/23 2146 08/26/23 2200 08/27/23 0027  BP: 124/61  (!) 119/59   Pulse: 92 92 89   Resp: (!) 21 (!) 21 20   Temp:    97.9 F (36.6 C)  TempSrc:    Oral  SpO2: 94% 94% 96%   Weight:      Height:       Constitutional: Resting in bed, NAD, calm, comfortable Eyes: EOMI, lids and conjunctivae normal ENMT: Mucous membranes are moist. Posterior pharynx clear of any exudate or lesions.Normal dentition.  Neck: normal, supple, no masses. Respiratory: clear to auscultation bilaterally, no wheezing, no crackles. Normal respiratory effort. No accessory muscle use.  Cardiovascular: Regular rate and rhythm, no murmurs / rubs / gallops. No extremity edema. 2+ pedal pulses. Abdomen: no tenderness, no masses palpated.  Musculoskeletal: no clubbing / cyanosis. No joint deformity upper and lower extremities. Good ROM, no contractures. Normal muscle tone.  Skin: no rashes, lesions, ulcers. No induration Neurologic: Sensation intact. Strength 5/5 in all 4.  Psychiatric: Normal judgment and insight. Alert and oriented x 3. Normal mood.   EKG: Not performed.  Assessment/Plan Principal Problem:   Biliary obstruction Active Problems:   Essential hypertension   Mixed hyperlipidemia   CAD (coronary artery disease)   Type 2 diabetes mellitus (HCC)   Jarold Macomber is a 73 y.o. male with medical history significant for CAD s/p prior stenting, T2DM, HTN, HLD, s/p cholecystectomy, Hx of choledocholithiasis who is admitted with biliary obstruction suspected due to choledocholithiasis versus soft tissue  neoplasm.  Assessment and Plan: Transaminitis with biliary duct dilation/suspected choledocholithiasis: CT shows moderate biliary duct dilatation secondary to soft tissue density within distal common duct.  Suspected due to choledocholithiasis although soft tissue neoplasm possible.  Febrile in the ED and has been started on empiric antibiotics. -GI to consult in a.m. -N.p.o. after midnight for potential ERCP -Continue IV Zosyn -Follow blood cultures -Continue IV fluid hydration overnight -Trend LFTs  Coronary artery disease: Stable.  Continue Lopressor, holding statin.  T2DM: Holding  metformin and Actos.  Placed on SSI q4h while NPO.  Hypertension: Continue Lopressor and amlodipine.  Hyperlipidemia: Holding statin given transaminitis.   DVT prophylaxis: SCDs Start: 08/26/23 2349 Code Status: Full code, confirmed with patient on admission Family Communication: Spouse at bedside Disposition Plan: From home and likely discharge to home pending clinical progress Consults called: [facility] Severity of Illness: The appropriate patient status for this patient is INPATIENT. Inpatient status is judged to be reasonable and necessary in order to provide the required intensity of service to ensure the patient's safety. The patient's presenting symptoms, physical exam findings, and initial radiographic and laboratory data in the context of their chronic comorbidities is felt to place them at high risk for further clinical deterioration. Furthermore, it is not anticipated that the patient will be medically stable for discharge from the hospital within 2 midnights of admission.   * I certify that at the point of admission it is my clinical judgment that the patient will require inpatient hospital care spanning beyond 2 midnights from the point of admission due to high intensity of service, high risk for further deterioration and high frequency of surveillance required.Darreld Mclean MD [facility]  If 7PM-7AM, please contact night-coverage [email or web address]  08/27/2023, 12:39 AM

## 2023-08-26 NOTE — ED Provider Notes (Signed)
  EMERGENCY DEPARTMENT AT [facility] Provider Note   CSN: [id number] Arrival date & time: 08/26/23  1538     History  No chief complaint on file.   Troy Boyer is a 73 y.o. male.  HPI   73 year old male with history of previous choledocholithiasis and ERCP in 2017 who follows with Dr. Adela Lank presents to the emergency department with concern for 1 month of right upper quadrant abdominal pain.  He has been experiencing nausea/vomiting and worsening p.o. intake and had a fall today secondary to generalized weakness.  History obtained from chart review and family as the patient became altered on arrival to the patient room.  Was reportedly conversational and oriented in triage but now responding to his name and following commands but a poor historian.  Home Medications Prior to Admission medications   Medication Sig Start Date End Date Taking? Authorizing Provider  Accu-Chek FastClix Lancets MISC CHECK BLOOD SUGAR ONCE OR  TWICE WEEKLY 04/25/23   Sharlene Dory, DO  amLODipine (NORVASC) 5 MG tablet TAKE 1 TABLET BY MOUTH DAILY 12/31/22   Sharlene Dory, DO  atorvastatin (LIPITOR) 80 MG tablet TAKE 1 TABLET BY MOUTH AT  BEDTIME 12/31/22   Wendling, Jilda Roche, DO  Continuous Glucose Sensor (FREESTYLE LIBRE 3 SENSOR) MISC Use to check blood sugar 01/26/23   Wendling, Jilda Roche, DO  ezetimibe (ZETIA) 10 MG tablet Take 1 tablet by mouth once daily 08/09/23   Lewayne Bunting, MD  fenofibrate (TRICOR) 48 MG tablet TAKE 1 TABLET BY MOUTH DAILY 11/18/22   Sharlene Dory, DO  glucose blood (ACCU-CHEK GUIDE) test strip Use daily to check blood sugar.  DX E11.65 04/25/23   Sharlene Dory, DO  Krill Oil 500 MG CAPS Take 500 mg by mouth 2 (two) times daily.    [provider]  metFORMIN (GLUCOPHAGE-XR) 500 MG 24 hr tablet Take 2 tablets (1,000 mg total) by mouth daily with breakfast. 05/11/23   Wendling, Jilda Roche, DO   metoprolol tartrate (LOPRESSOR) 25 MG tablet Take 1 tablet (25 mg total) by mouth 2 (two) times daily. 11/24/22   Azalee Course, PA  Multiple Vitamin (MULTIVITAMIN WITH MINERALS) TABS tablet Take 1 tablet by mouth daily.    [provider]  MYRBETRIQ 50 MG TB24 tablet Take 50 mg by mouth daily. 04/10/23   [provider]  nitroGLYCERIN (NITROSTAT) 0.4 MG SL tablet Place 1 tablet (0.4 mg total) under the tongue every 5 (five) minutes as needed for chest pain. 04/26/22   Sharlene Dory, DO  omeprazole (PRILOSEC) 40 MG capsule Take 1 capsule (40 mg total) by mouth daily before breakfast. 08/24/23   Armbruster, Willaim Rayas, MD  pioglitazone (ACTOS) 45 MG tablet Take 1 tablet (45 mg total) by mouth daily. 05/11/23   Sharlene Dory, DO  ramipril (ALTACE) 10 MG capsule TAKE 1 CAPSULE BY MOUTH TWICE  DAILY 12/06/22   Lewayne Bunting, MD  tadalafil (CIALIS) 5 MG tablet Take 1 tablet (5 mg total) by mouth daily. 10/19/21   Sharlene Dory, DO  traZODone (DESYREL) 50 MG tablet TAKE 1 TABLET BY MOUTH AT  BEDTIME AS NEEDED FOR SLEEP 10/14/22   Wendling, Jilda Roche, DO      Allergies    Protonix [pantoprazole]    Review of Systems   Review of Systems  Reason unable to perform ROS: Limited secondary to mental status.  Constitutional:  Positive for appetite change and fatigue.  Gastrointestinal:  Positive  for abdominal pain, nausea and vomiting.    Physical Exam Updated Vital Signs BP 135/60 (BP Location: Left Arm)   Pulse 100   Temp 99.4 F (37.4 C) (Oral)   Resp (!) 27   Ht 5\' 4"  (1.626 m)   Wt 77.1 kg   SpO2 94%   BMI 29.18 kg/m  Physical Exam Vitals and nursing note reviewed.  Constitutional:      General: He is not in acute distress.    Appearance: Normal appearance.  HENT:     Head: Normocephalic.     Mouth/Throat:     Mouth: Mucous membranes are moist.  Cardiovascular:     Rate and Rhythm: Normal rate.  Pulmonary:     Comments: At times  tachypneic but protecting airway Abdominal:     General: Bowel sounds are normal.     Palpations: Abdomen is soft.     Tenderness: There is abdominal tenderness. There is no guarding or rebound.  Skin:    General: Skin is warm.  Neurological:     Mental Status: He is alert and oriented to person, place, and time. Mental status is at baseline.  Psychiatric:        Mood and Affect: Mood normal.     ED Results / Procedures / Treatments   Labs (all labs ordered are listed, but only abnormal results are displayed) Labs Reviewed  COMPREHENSIVE METABOLIC PANEL - Abnormal; Notable for the following components:      Result Value   Sodium 134 (*)    CO2 20 (*)    Glucose, Bld 187 (*)    AST 476 (*)    ALT 489 (*)    Alkaline Phosphatase 550 (*)    Total Bilirubin 5.8 (*)    All other components within normal limits  CBC - Abnormal; Notable for the following components:   Hemoglobin 12.7 (*)    RDW 16.1 (*)    All other components within normal limits  URINALYSIS, ROUTINE W REFLEX MICROSCOPIC - Abnormal; Notable for the following components:   Color, Urine AMBER (*)    Glucose, UA 50 (*)    Bilirubin Urine SMALL (*)    All other components within normal limits  AMMONIA - Abnormal; Notable for the following components:   Ammonia 72 (*)    All other components within normal limits  RESP PANEL BY RT-PCR (RSV, FLU A&B, COVID)  RVPGX2  CULTURE, BLOOD (ROUTINE X 2)  CULTURE, BLOOD (ROUTINE X 2)  LIPASE, BLOOD  I-STAT CG4 LACTIC ACID, ED    EKG None  Radiology CT ABDOMEN PELVIS W CONTRAST Result Date: 08/26/2023 CLINICAL DATA:  One-month history of right upper quadrant pain. Weakness. Elevated liver function tests. EXAM: CT ABDOMEN AND PELVIS WITH CONTRAST TECHNIQUE: Multidetector CT imaging of the abdomen and pelvis was performed using the standard protocol following bolus administration of intravenous contrast. RADIATION DOSE REDUCTION: This exam was performed according to the  departmental dose-optimization program which includes automated exposure control, adjustment of the mA and/or kV according to patient size and/or use of iterative reconstruction technique. CONTRAST:  OMNIPAQUE IOHEXOL 300 MG/ML  SOLN COMPARISON:  Lung cancer screening CT 07/22/2023. No prior abdominal CTs available. A report of an abdominopelvic CT of 07/26/2001 is reviewed. FINDINGS: Lower chest: Clear lung bases. Mitral and aortic valve calcification. Coronary artery calcification. Hepatobiliary: Posterior right hepatic lobe hypoattenuating 2.5 x 2.3 cm lesion on 20/2. Cholecystectomy. Hypoattenuation in the operative bed including up to 2.1 cm in 25/2 could  represent a combination of focal steatosis and possibly a gallbladder or cystic duct remnant. No surrounding inflammation. Since 07/22/2023, development of moderate intra and extrahepatic biliary duct dilatation. Common duct dilatation is followed to the level of a 1.7 cm soft tissue density within the distal common duct including on coronal image 67. Pancreas: Normal, without mass or ductal dilatation. Spleen: Normal in size, without focal abnormality. Adrenals/Urinary Tract: Mild right adrenal thickening. Interpolar right renal 3.3 cm cyst. Bilateral too small to characterize renal lesions are most likely cysts . In the absence of clinically indicated signs/symptoms require(s) no independent follow-up. No hydronephrosis. Apparent mild bladder wall thickening is favored to be due to underdistention. Stomach/Bowel: Normal stomach, without wall thickening. Extensive colonic diverticulosis. Normal terminal ileum and appendix. Normal small bowel. Vascular/Lymphatic: Advanced aortic and branch vessel atherosclerosis. No abdominopelvic adenopathy. Reproductive: Mild prostatomegaly. Other: No significant free fluid. Small bilateral fat containing inguinal hernias. Musculoskeletal: No acute osseous abnormality. IMPRESSION: 1. Interval development, since  07/22/2023, of moderate biliary duct dilatation secondary to soft tissue density within the distal common duct. Most likely noncalcified choledocholithiasis. A soft tissue neoplasm such as extrahepatic cholangiocarcinoma could look similar but is felt less likely. Recommend further evaluation with ERCP. 2. Right hepatic lobe hypoattenuating lesion is indeterminate. Correlate with any history of primary malignancy. Consider further evaluation with outpatient, nonemergent pre and post contrast abdominal MRI. 3. Cholecystectomy. Hypoattenuation in the region of the operative bed could represent focal hepatic steatosis and/or a gallbladder/cystic duct remnant. No surrounding inflammation to suggest acute clinical significance. 4. Incidental findings, including: Coronary artery atherosclerosis. Aortic Atherosclerosis (ICD10-I70.0). Mitral and aortic valvular calcifications for which echocardiography should be considered. Prostatomegaly. Electronically Signed   By: Jeronimo Greaves M.D.   On: 08/26/2023 21:04    Procedures .Critical Care  Performed by: Rozelle Logan, DO Authorized by: Rozelle Logan, DO   Critical care provider statement:    Critical care time (minutes):  30   Critical care time was exclusive of:  Separately billable procedures and treating other patients   Critical care was necessary to treat or prevent imminent or life-threatening deterioration of the following conditions:  CNS failure or compromise   Critical care was time spent personally by me on the following activities:  Development of treatment plan with patient or surrogate, discussions with consultants, evaluation of patient's response to treatment, examination of patient, ordering and review of laboratory studies, ordering and review of radiographic studies, ordering and performing treatments and interventions, pulse oximetry, re-evaluation of patient's condition and review of old charts     Medications Ordered in  ED Medications  lactated ringers bolus 1,000 mL (has no administration in time range)  piperacillin-tazobactam (ZOSYN) IVPB 3.375 g (has no administration in time range)  ondansetron (ZOFRAN-ODT) disintegrating tablet 4 mg (4 mg Oral Given 08/26/23 1600)  ketorolac (TORADOL) 15 MG/ML injection 15 mg (15 mg Intravenous Given 08/26/23 1957)  sodium chloride 0.9 % bolus 500 mL (0 mLs Intravenous Stopped 08/26/23 2047)  ondansetron (ZOFRAN) injection 4 mg (4 mg Intravenous Given 08/26/23 1957)  iohexol (OMNIPAQUE) 300 MG/ML solution 100 mL (100 mLs Intravenous Contrast Given 08/26/23 2034)    ED Course/ Medical Decision Making/ A&P                                 Medical Decision Making Amount and/or Complexity of Data Reviewed Labs: ordered.  Risk Prescription drug management. Decision regarding hospitalization.  73 year old male presents emergency department ongoing right upper quadrant abdominal pain, nausea/vomiting.  Was baseline in triage but developed mental status change in conjunction with a high fever at the time of being placed in the patient room.  He is answering to his name and following commands, protecting his airway but otherwise a poor historian at this time.  Blood work shows transaminitis that has been steadily worsening with new hyperbilirubinemia at 5.8.  Kidney function is normal, lactic acid is normal, no leukocytosis.  CT scan shows interval development of biliary duct dilation, questionable soft mass or noncalcified choledocholithiasis.  Spoke with on-call gastroenterologist for [facility].  They recommended the patient is n.p.o. at midnight for most likely ERCP tomorrow.  Antibiotics are ordered.    Mental status is significantly improved after the resolution of fever.  Patients evaluation and results requires admission for further treatment and care.  Spoke with hospitalist, reviewed patient's ED course and they accept admission.  Patient agrees with admission plan,  offers no new complaints and is stable/unchanged at time of admit.        Final Clinical Impression(s) / ED Diagnoses Final diagnoses:  None    Rx / DC Orders ED Discharge Orders     None         Rozelle Logan, DO 08/26/23 2252

## 2023-08-26 NOTE — ED Provider Triage Note (Signed)
 Emergency Medicine Provider Triage Evaluation Note  Troy Boyer , a 73 y.o. male  was evaluated in triage.  Pt complains of abd pain. Endorse intermittent progressive RUQ pain for 1 month, but progressively worse.  Endorse n/v, weak, weightloss.  Was seen by GI recently and was noted to have transaminitis.  Plan for CT scan by GI.  Wife said he fell out of bed today, and is weak. GI specialist Dr. Adela Lank  Review of Systems  Positive: As above Negative: As above  Physical Exam  BP (!) 149/74   Pulse 90   Temp 100.2 F (37.9 C) (Oral)   Resp 18   Ht 5\' 4"  (1.626 m)   Wt 77.1 kg   SpO2 95%   BMI 29.18 kg/m  Gen:   Awake, no distress   Resp:  Normal effort  MSK:   Moves extremities without difficulty  Other:    Medical Decision Making  Medically screening exam initiated at 4:12 PM.  Appropriate orders placed.  Raven Harmes was informed that the remainder of the evaluation will be completed by another provider, this initial triage assessment does not replace that evaluation, and the importance of remaining in the ED until their evaluation is complete.     [name], PA-C 08/26/23 1614

## 2023-08-27 ENCOUNTER — Inpatient Hospital Stay (HOSPITAL_COMMUNITY): Admitting: Anesthesiology

## 2023-08-27 ENCOUNTER — Encounter (HOSPITAL_COMMUNITY)
Admission: EM | Disposition: A | Discharge status: Home / Self Care | Payer: Self-pay | Source: Home / Self Care | Attending: Internal Medicine

## 2023-08-27 ENCOUNTER — Encounter (HOSPITAL_BASED_OUTPATIENT_CLINIC_OR_DEPARTMENT_OTHER): Payer: Self-pay

## 2023-08-27 ENCOUNTER — Ambulatory Visit (HOSPITAL_BASED_OUTPATIENT_CLINIC_OR_DEPARTMENT_OTHER): Payer: Medicare Other

## 2023-08-27 ENCOUNTER — Encounter (HOSPITAL_COMMUNITY): Payer: Self-pay | Admitting: Internal Medicine

## 2023-08-27 ENCOUNTER — Inpatient Hospital Stay (HOSPITAL_COMMUNITY)

## 2023-08-27 DIAGNOSIS — K8050 Calculus of bile duct without cholangitis or cholecystitis without obstruction: Secondary | ICD-10-CM | POA: Diagnosis not present

## 2023-08-27 DIAGNOSIS — K831 Obstruction of bile duct: Secondary | ICD-10-CM | POA: Diagnosis not present

## 2023-08-27 DIAGNOSIS — E119 Type 2 diabetes mellitus without complications: Secondary | ICD-10-CM

## 2023-08-27 DIAGNOSIS — R1011 Right upper quadrant pain: Secondary | ICD-10-CM

## 2023-08-27 DIAGNOSIS — I10 Essential (primary) hypertension: Secondary | ICD-10-CM | POA: Diagnosis not present

## 2023-08-27 DIAGNOSIS — R7989 Other specified abnormal findings of blood chemistry: Secondary | ICD-10-CM

## 2023-08-27 DIAGNOSIS — K838 Other specified diseases of biliary tract: Secondary | ICD-10-CM

## 2023-08-27 DIAGNOSIS — Z9889 Other specified postprocedural states: Secondary | ICD-10-CM | POA: Diagnosis not present

## 2023-08-27 DIAGNOSIS — I2510 Atherosclerotic heart disease of native coronary artery without angina pectoris: Secondary | ICD-10-CM

## 2023-08-27 DIAGNOSIS — R509 Fever, unspecified: Secondary | ICD-10-CM | POA: Diagnosis not present

## 2023-08-27 DIAGNOSIS — R932 Abnormal findings on diagnostic imaging of liver and biliary tract: Secondary | ICD-10-CM

## 2023-08-27 DIAGNOSIS — R935 Abnormal findings on diagnostic imaging of other abdominal regions, including retroperitoneum: Secondary | ICD-10-CM

## 2023-08-27 DIAGNOSIS — R7401 Elevation of levels of liver transaminase levels: Principal | ICD-10-CM

## 2023-08-27 HISTORY — PX: REMOVAL OF STONES: SHX5545

## 2023-08-27 HISTORY — PX: SPHINCTEROTOMY: SHX5279

## 2023-08-27 HISTORY — PX: ERCP: SHX5425

## 2023-08-27 HISTORY — PX: BALLOON DILATION: SHX5330

## 2023-08-27 LAB — GLUCOSE, CAPILLARY
Glucose-Capillary: 142 mg/dL — ABNORMAL HIGH (ref 70–99)
Glucose-Capillary: 267 mg/dL — ABNORMAL HIGH (ref 70–99)
Glucose-Capillary: 380 mg/dL — ABNORMAL HIGH (ref 70–99)
Glucose-Capillary: 400 mg/dL — ABNORMAL HIGH (ref 70–99)

## 2023-08-27 LAB — COMPREHENSIVE METABOLIC PANEL
ALT: 317 U/L — ABNORMAL HIGH (ref 0–44)
AST: 252 U/L — ABNORMAL HIGH (ref 15–41)
Albumin: 2.8 g/dL — ABNORMAL LOW (ref 3.5–5.0)
Alkaline Phosphatase: 390 U/L — ABNORMAL HIGH (ref 38–126)
Anion gap: 8 (ref 5–15)
BUN: 17 mg/dL (ref 8–23)
CO2: 25 mmol/L (ref 22–32)
Calcium: 8.4 mg/dL — ABNORMAL LOW (ref 8.9–10.3)
Chloride: 106 mmol/L (ref 98–111)
Creatinine, Ser: 1.01 mg/dL (ref 0.61–1.24)
GFR, Estimated: >60 mL/min (ref >60)
Glucose, Bld: 142 mg/dL — ABNORMAL HIGH (ref 70–99)
Potassium: 3.6 mmol/L (ref 3.5–5.1)
Sodium: 139 mmol/L (ref 135–145)
Total Bilirubin: 5.6 mg/dL — ABNORMAL HIGH (ref 0.0–1.2)
Total Protein: 5.8 g/dL — ABNORMAL LOW (ref 6.5–8.1)

## 2023-08-27 LAB — BLOOD CULTURE ID PANEL (REFLEXED) - BCID2

## 2023-08-27 LAB — CBG MONITORING, ED
Glucose-Capillary: 112 mg/dL — ABNORMAL HIGH (ref 70–99)
Glucose-Capillary: 136 mg/dL — ABNORMAL HIGH (ref 70–99)
Glucose-Capillary: 158 mg/dL — ABNORMAL HIGH (ref 70–99)
Glucose-Capillary: 207 mg/dL — ABNORMAL HIGH (ref 70–99)

## 2023-08-27 LAB — CBC
HCT: 34.2 % — ABNORMAL LOW (ref 39.0–52.0)
Hemoglobin: 10.5 g/dL — ABNORMAL LOW (ref 13.0–17.0)
MCH: 29.2 pg (ref 26.0–34.0)
MCHC: 30.7 g/dL (ref 30.0–36.0)
MCV: 95.3 fL (ref 80.0–100.0)
Platelets: 183 10*3/uL (ref 150–400)
RBC: 3.59 MIL/uL — ABNORMAL LOW (ref 4.22–5.81)
RDW: 16.2 % — ABNORMAL HIGH (ref 11.5–15.5)
WBC: 11.8 10*3/uL — ABNORMAL HIGH (ref 4.0–10.5)
nRBC: 0 % (ref 0.0–0.2)

## 2023-08-27 LAB — PROTIME-INR
INR: 1.1 (ref 0.8–1.2)
Prothrombin Time: 14.4 s (ref 11.4–15.2)

## 2023-08-27 LAB — MRSA NEXT GEN BY PCR, NASAL: MRSA by PCR Next Gen: NOT DETECTED

## 2023-08-27 SURGERY — ERCP, WITH INTERVENTION IF INDICATED
Anesthesia: General

## 2023-08-27 MED ORDER — TRAZODONE HCL 50 MG PO TABS
50.0000 mg | ORAL_TABLET | Freq: Every evening | ORAL | Status: DC | PRN
Start: 2023-08-27 — End: 2023-08-30
  Administered 2023-08-27 – 2023-08-29 (×3): 50 mg via ORAL
  Filled 2023-08-27 (×3): qty 1

## 2023-08-27 MED ORDER — SODIUM CHLORIDE 0.9 % IV SOLN
INTRAVENOUS | Status: DC | PRN
  Administered 2023-08-27: 80 mL

## 2023-08-27 MED ORDER — GLUCAGON HCL RDNA (DIAGNOSTIC) 1 MG IJ SOLR
INTRAMUSCULAR | Status: DC | PRN
  Administered 2023-08-27: .5 mg via INTRAVENOUS

## 2023-08-27 MED ORDER — LACTATED RINGERS IV SOLN: INTRAVENOUS | Status: DC

## 2023-08-27 MED ORDER — PHENYLEPHRINE 80 MCG/ML (10ML) SYRINGE FOR IV PUSH (FOR BLOOD PRESSURE SUPPORT)
PREFILLED_SYRINGE | INTRAVENOUS | Status: DC | PRN
  Administered 2023-08-27: 80 ug via INTRAVENOUS
  Administered 2023-08-27: 120 ug via INTRAVENOUS

## 2023-08-27 MED ORDER — LIDOCAINE HCL (CARDIAC) PF 100 MG/5ML IV SOSY
PREFILLED_SYRINGE | INTRAVENOUS | Status: DC | PRN
  Administered 2023-08-27: 60 mg via INTRATRACHEAL

## 2023-08-27 MED ORDER — ROCURONIUM BROMIDE 10 MG/ML (PF) SYRINGE
PREFILLED_SYRINGE | INTRAVENOUS | Status: DC | PRN
  Administered 2023-08-27: 50 mg via INTRAVENOUS

## 2023-08-27 MED ORDER — CIPROFLOXACIN IN D5W 400 MG/200ML IV SOLN
INTRAVENOUS | Status: AC
  Filled 2023-08-27: qty 200

## 2023-08-27 MED ORDER — DICLOFENAC SUPPOSITORY 100 MG
RECTAL | Status: DC | PRN
  Administered 2023-08-27: 100 mg via RECTAL

## 2023-08-27 MED ORDER — INSULIN ASPART 100 UNIT/ML IJ SOLN
0.0000 [IU] | Freq: Every day | INTRAMUSCULAR | Status: DC
  Administered 2023-08-27: 4 [IU] via SUBCUTANEOUS

## 2023-08-27 MED ORDER — ONDANSETRON HCL 4 MG/2ML IJ SOLN
INTRAMUSCULAR | Status: DC | PRN
  Administered 2023-08-27: 4 mg via INTRAVENOUS

## 2023-08-27 MED ORDER — FENTANYL CITRATE (PF) 100 MCG/2ML IJ SOLN
INTRAMUSCULAR | Status: DC | PRN
  Administered 2023-08-27: 100 ug via INTRAVENOUS

## 2023-08-27 MED ORDER — PIPERACILLIN-TAZOBACTAM 3.375 G IVPB
3.3750 g | Freq: Three times a day (TID) | INTRAVENOUS | Status: DC
  Administered 2023-08-27 – 2023-08-29 (×7): 3.375 g via INTRAVENOUS
  Filled 2023-08-27 (×7): qty 50

## 2023-08-27 MED ORDER — ORAL CARE MOUTH RINSE: 15.0000 mL | OROMUCOSAL | Status: DC | PRN

## 2023-08-27 MED ORDER — AMLODIPINE BESYLATE 5 MG PO TABS
5.0000 mg | ORAL_TABLET | Freq: Every day | ORAL | Status: DC
  Administered 2023-08-27: 5 mg via ORAL
  Filled 2023-08-27: qty 1

## 2023-08-27 MED ORDER — INSULIN ASPART 100 UNIT/ML IJ SOLN
0.0000 [IU] | Freq: Three times a day (TID) | INTRAMUSCULAR | Status: DC
Start: 2023-08-27 — End: 2023-08-28
  Administered 2023-08-27: 1 [IU] via SUBCUTANEOUS
  Administered 2023-08-28: 3 [IU] via SUBCUTANEOUS
  Administered 2023-08-28: 7 [IU] via SUBCUTANEOUS
  Administered 2023-08-28: 5 [IU] via SUBCUTANEOUS

## 2023-08-27 MED ORDER — DICLOFENAC SUPPOSITORY 100 MG
RECTAL | Status: AC
  Filled 2023-08-27: qty 1

## 2023-08-27 MED ORDER — SUGAMMADEX SODIUM 200 MG/2ML IV SOLN
INTRAVENOUS | Status: DC | PRN
  Administered 2023-08-27: 200 mg via INTRAVENOUS

## 2023-08-27 MED ORDER — FENTANYL CITRATE (PF) 100 MCG/2ML IJ SOLN
INTRAMUSCULAR | Status: AC
  Filled 2023-08-27: qty 2

## 2023-08-27 MED ORDER — PROPOFOL 10 MG/ML IV BOLUS
INTRAVENOUS | Status: AC
Start: 2023-08-27 — End: ?
  Filled 2023-08-27: qty 20

## 2023-08-27 MED ORDER — DICLOFENAC SUPPOSITORY 100 MG
100.0000 mg | Freq: Once | RECTAL | Status: AC
  Administered 2023-08-27: 100 mg via RECTAL
  Filled 2023-08-27: qty 1

## 2023-08-27 MED ORDER — ENOXAPARIN SODIUM 40 MG/0.4ML IJ SOSY
40.0000 mg | PREFILLED_SYRINGE | INTRAMUSCULAR | Status: DC
  Administered 2023-08-27 – 2023-08-28 (×2): 40 mg via SUBCUTANEOUS
  Filled 2023-08-27 (×2): qty 0.4

## 2023-08-27 MED ORDER — PROPOFOL 10 MG/ML IV BOLUS
INTRAVENOUS | Status: DC | PRN
  Administered 2023-08-27: 160 mg via INTRAVENOUS

## 2023-08-27 MED ORDER — METOPROLOL TARTRATE 25 MG PO TABS
25.0000 mg | ORAL_TABLET | Freq: Two times a day (BID) | ORAL | Status: DC
  Administered 2023-08-27 – 2023-08-28 (×3): 25 mg via ORAL
  Filled 2023-08-27 (×3): qty 1

## 2023-08-27 MED ORDER — GLUCAGON HCL RDNA (DIAGNOSTIC) 1 MG IJ SOLR
INTRAMUSCULAR | Status: AC
  Filled 2023-08-27: qty 1

## 2023-08-27 MED ORDER — LACTATED RINGERS IV SOLN
INTRAVENOUS | Status: AC | PRN
  Administered 2023-08-27: 1000 mL via INTRAVENOUS

## 2023-08-27 MED ORDER — DEXAMETHASONE SODIUM PHOSPHATE 10 MG/ML IJ SOLN
INTRAMUSCULAR | Status: DC | PRN
  Administered 2023-08-27: 8 mg via INTRAVENOUS

## 2023-08-27 NOTE — Anesthesia Procedure Notes (Addendum)
 Procedure Name: Intubation Date/Time: 08/27/2023 1:48 PM  Performed by: Oletha Cruel, CRNAPre-anesthesia Checklist: Patient identified, Emergency Drugs available, Patient being monitored and Suction available Patient Re-evaluated:Patient Re-evaluated prior to induction Oxygen Delivery Method: Circle system utilized Preoxygenation: Pre-oxygenation with 100% oxygen Induction Type: IV induction Ventilation: Mask ventilation without difficulty Laryngoscope Size: Mac and 4 Grade View: Grade II Tube type: Oral Tube size: 7.0 mm Number of attempts: 1 Airway Equipment and Method: Stylet Placement Confirmation: ETT inserted through vocal cords under direct vision, positive ETCO2, breath sounds checked- equal and bilateral and CO2 detector Secured at: 22 cm Tube secured with: Tape Dental Injury: Teeth and Oropharynx as per pre-operative assessment  Comments: Atraumatic intubation. Lips and teeth remain in preoperative condition.

## 2023-08-27 NOTE — Op Note (Signed)
 [facility] Patient Name: Troy Boyer Procedure Date: 08/27/2023 MRN: [id number] Attending MD: Wilhemina Bonito. Marina Goodell , MD, [id number] Date of Birth: 02-Mar-1951 CSN: [id number] Age: 73 Admit Type: Inpatient Procedure:                ERCP with sphincterotomy, balloon sphincteroplasty,                            common duct stone extraction Indications:              Abdominal pain in the right upper quadrant, Biliary                            dilation on Computed Tomogram Scan, Bile duct stone                            on Computed Tomogram Scan, Abnormal liver function                            test Providers:                Wilhemina Bonito. Marina Goodell, MD, Eliberto Ivory, RN, Rhodia Albright,                            Technician Referring MD:             [facility] hospitalist Medicines:                General Anesthesia Complications:            No immediate complications. Estimated Blood Loss:     Estimated blood loss: none. Procedure:                Pre-Anesthesia Assessment:                           - Prior to the procedure, a History and Physical                            was performed, and patient medications and                            allergies were reviewed. The patient is competent.                            The risks and benefits of the procedure and the                            sedation options and risks were discussed with the                            patient. All questions were answered and informed                            consent was obtained. Patient identification and  proposed procedure were verified by the physician.                            Mental Status Examination: alert and oriented.                            Airway Examination: normal oropharyngeal airway and                            neck mobility. Respiratory Examination: clear to                            auscultation. CV Examination: normal. Prophylactic                             Antibiotics: The patient does not require                            prophylactic antibiotics. Prior Anticoagulants: The                            patient has taken no anticoagulant or antiplatelet                            agents. ASA Grade Assessment: III - A patient with                            severe systemic disease. After reviewing the risks                            and benefits, the patient was deemed in                            satisfactory condition to undergo the procedure.                            The anesthesia plan was to use moderate sedation /                            analgesia (conscious sedation). Immediately prior                            to administration of medications, the patient was                            re-assessed for adequacy to receive sedatives. The                            heart rate, respiratory rate, oxygen saturations,                            blood pressure, adequacy of pulmonary ventilation,  and response to care were monitored throughout the                            procedure. The physical status of the patient was                            re-assessed after the procedure.                           After obtaining informed consent, the scope was                            passed under direct vision. Throughout the                            procedure, the patient's blood pressure, pulse, and                            oxygen saturations were monitored continuously. The                            TJF-Q190V ([id number] Olympus duodenoscope was                            introduced through the mouth, and used to inject                            contrast into and used to inject contrast into the                            bile duct. The ERCP was accomplished without                            difficulty. The patient tolerated the procedure                            well. Scope In: Scope Out: Findings:       1. The side-viewing endoscope was passed blindly into the esophagus. The       stomach and duodenum were grossly normal. The major ampulla had a long       intraduodenal portion and sat within a diverticulum. The minor ampulla       was not sought      2. A scout radiograph of the abdomen with the endoscope and position       revealed cholecystectomy clips      3. The common bile duct was selectively and deeply cannulated on the       first pass of the sphincterotome. Injection of contrast yielded a       diffusely dilated bile duct measuring 15 mm in its largest portion.       There was a large stone in the common duct measuring about 14 mm.      4. The previous biliary sphincterotomy was extended. Purulent material       emanated from the bile duct      5. A balloon sphincteroplasty (10  mm balloon) was performed      6. The giant stone was removed in multiple fragments with a 15 mm       extraction balloon. This was a cholesterol type soft stone. There were       no residual filling defects post stone extraction and drainage was       excellent.      7. There was no manipulation or injection of the pancreatic duct. Impression:               1. Choledocholithiasis status post ERC with biliary                            sphincterotomy, balloon sphincteroplasty, and                            common duct stone extraction                           2. Remote cholecystectomy                           3. Cholangitis                           4. Indeterminate liver lesion. Rule out abscess                            (most likely) or neoplasm. Moderate Sedation:      none Recommendation:           1. Standard post ERCP care                           2. Continue antibiotics                           3. Observe patient's clinical course.                           4. Trend laboratories                           5. Heart healthy diet                           6. MRI of the liver to be done  prior to discharge                           The above was discussed with the patient and his                            wife Amil Amen post procedure. They were provided a                            copy of this report Procedure Code(s):        --- Professional ---  73220, Endoscopic retrograde                            cholangiopancreatography (ERCP); with removal of                            calculi/debris from biliary/pancreatic duct(s)                           43262, Endoscopic retrograde                            cholangiopancreatography (ERCP); with                            sphincterotomy/papillotomy Diagnosis Code(s):        --- Professional ---                           K83.8, Other specified diseases of biliary tract                           Z90.49, Acquired absence of other specified parts                            of digestive tract                           K80.50, Calculus of bile duct without cholangitis                            or cholecystitis without obstruction                           R10.11, Right upper quadrant pain                           R79.89, Other specified abnormal findings of blood                            chemistry CPT copyright 2022 [employer]. All rights reserved. The codes documented in this report are preliminary and upon coder review may  be revised to meet current compliance requirements. Wilhemina Bonito. Marina Goodell, MD 08/27/2023 3:05:22 PM This report has been signed electronically. Number of Addenda: 0

## 2023-08-27 NOTE — Plan of Care (Signed)
  Problem: Skin Integrity: Goal: Risk for impaired skin integrity will decrease Outcome: Progressing   Problem: Health Behavior/Discharge Planning: Goal: Ability to manage health-related needs will improve Outcome: Progressing   Problem: Clinical Measurements: Goal: Ability to maintain clinical measurements within normal limits will improve Outcome: Progressing   Problem: Clinical Measurements: Goal: Will remain free from infection Outcome: Progressing   Problem: Clinical Measurements: Goal: Diagnostic test results will improve Outcome: Progressing   Problem: Clinical Measurements: Goal: Respiratory complications will improve Outcome: Progressing   Problem: Clinical Measurements: Goal: Cardiovascular complication will be avoided Outcome: Progressing

## 2023-08-27 NOTE — Progress Notes (Signed)
 Pharmacy Antibiotic Note  Denzell Colasanti is a 73 y.o. male admitted on 08/26/2023 with  intra-abdominal infection .  Pharmacy has been consulted for Zosyn dosing.  Plan: Zosyn 3.375g IV q8h (4 hour infusion).  Height: 5\' 4"  (162.6 cm) Weight: 77.1 kg (170 lb) IBW/kg (Calculated) : 59.2  Temp (24hrs), Avg:102 F (38.9 C), Min:99.4 F (37.4 C), Max:105.4 F (40.8 C)  Recent Labs  Lab 08/22/23 1537 08/26/23 1626 08/26/23 1923  WBC 7.2 10.5  --   CREATININE 0.92 0.65  --   LATICACIDVEN  --   --  1.5    Estimated Creatinine Clearance: 79.5 mL/min (by C-G formula based on SCr of 0.65 mg/dL).    Allergies  Allergen Reactions   Protonix [Pantoprazole] Other (See Comments)    Exacerbated reflux s/s's    Thank you for allowing pharmacy to be a part of this patient's care.  Junita Push PharmD 08/27/2023 12:01 AM

## 2023-08-27 NOTE — Consult Note (Addendum)
 Consultation  Referring Provider: [facility]/ Sharolyn Douglas Primary Care Physician:  Sharlene Dory, DO Primary Gastroenterologist:  Dr. Adela Lank  Reason for Consultation: Right upper quadrant pain, elevated LFTs, fever  HPI: Troy Boyer is a 73 y.o. male, known to Dr Adela Lank. Patient was seen in the office on 08/24/2023 with complaints of intermittent right upper quadrant pain which she describes as stabbing in nature and recurrent throughout the day which has been present since January.  He does not feel that this is triggered by oral intake. He is status post cholecystectomy and also has history in 2017 postoperatively.  He was found to have choledocholithiasis, underwent sphincterotomy and stone removal. His wife says his appetite has been poor, he has had intermittent nausea and vomiting and has also been having intermittent episodes of fever and chills over the past month.  They had documented at home 99-100 range.  He has lost about 24 pounds  Worsening symptoms over the past week or so and noticed increased darkening of urine and onset of jaundice.  Labs in the office from earlier this week  with tbilio.8/alk phosp 490/ AST  276/ALT 321. He has developed increased weakness and malaise over the past couple of days and family noticed some mild alteration of mental status and brought him to the emergency room. Labs show WBC 10.5/hemoglobin 12.7/hematocrit 40.0/platelets 255 Sodium 134/potassium 3.7/BUN 11/creatinine 0.65 T. bili 5.8/alk phos 550/AST 476/ALT 489 Lipase within normal limits Lactate WNL BC preliminary growing gram-negative rods  Febrile to 102.4 last p.m.  Started on IV Zosyn  CT abdomen and pelvis shows an area of hypoattenuation in the operative bed including up to 2.1 cm on 1 image which could represent combination of focal steatosis or possible gallbladder cystic duct remnant, no surrounding inflammation, there is development of moderate intra and  extrahepatic biliary ductal dilation with common bile duct dilated to the level of a 1.7 cm soft tissue density within the distal common bile duct There is a right posterior hepatic lobe hypoattenuating lesion 2.5 x 2.3 cm Rule out choledocholithiasis versus cholangiocarcinoma.  Right hepatic lobe is indeterminant consider MRI  Patient feels better this morning has not had any recurrent fever and is not having any significant pain.    Past Medical History:  Diagnosis Date   Adenomatous colon polyp    tubular   CAD (coronary artery disease)    a. Cath 06/08/01 at [facility] with normal LM and LAD, 95% LCx s/p Circumflex stent 4.0 x 15 mm Penta and 85% and 80% prox RCA s/p 3.5 x 23 mm Penta b. cath 01/20/2015 95% prox LCx ISR treated with DES, 55% prox to mid RCA, 45% distal RCA, 40% midLAD, EF 55%     Cataract    Diabetes mellitus without complication (HCC)    Diarrhea    Diverticulosis    Erectile dysfunction    Gallstones    Hyperlipidemia    Hypertension    Myocardial infarction [facility])     Past Surgical History:  Procedure Laterality Date   ANGIOPLASTY  2002   2 stents   CARDIAC CATHETERIZATION N/A 01/20/2015   Procedure: Left Heart Cath and Coronary Angiography;  Surgeon: Lyn Records, MD;  Location: [facility] INVASIVE CV LAB;  Service: Cardiovascular;  Laterality: N/A;   CARDIAC CATHETERIZATION N/A 01/27/2016   Procedure: Left Heart Cath and Coronary Angiography;  Surgeon: Tonny Bollman, MD;  Location: [facility] INVASIVE CV LAB;  Service: Cardiovascular;  Laterality: N/A;   CHOLECYSTECTOMY N/A 02/09/2016  Procedure: LAPAROSCOPIC CHOLECYSTECTOMY WITH INTRAOPERATIVE CHOLANGIOGRAM;  Surgeon: Harriette Bouillon, MD;  Location: [facility] OR;  Service: General;  Laterality: N/A;   COLONOSCOPY W/ POLYPECTOMY  08/2013   Avg risk screening, Dr Walker Kehr. 5 mm tubular adenoma ascending.  Mild to moderated descending and sigmoid tics.  Internal hemorrhoids   CORONARY ANGIOPLASTY WITH STENT PLACEMENT     ERCP  N/A 02/13/2016   Procedure: ENDOSCOPIC RETROGRADE CHOLANGIOPANCREATOGRAPHY (ERCP);  Surgeon: Sherrilyn Rist, MD;  Location: [facility] ENDOSCOPY;  Service: Gastroenterology;  Laterality: N/A;   ROTATOR CUFF REPAIR Right 2009   ROTATOR CUFF REPAIR Left 2014   TRIGGER FINGER RELEASE Bilateral 2013   4 fingers, middle finger on both hands    Prior to Admission medications   Medication Sig Start Date End Date Taking? Authorizing Provider  amLODipine (NORVASC) 5 MG tablet TAKE 1 TABLET BY MOUTH DAILY 12/31/22  Yes Sharlene Dory, DO  ezetimibe (ZETIA) 10 MG tablet Take 1 tablet by mouth once daily 08/09/23  Yes Crenshaw, Madolyn Frieze, MD  fenofibrate (TRICOR) 48 MG tablet TAKE 1 TABLET BY MOUTH DAILY 11/18/22  Yes Sharlene Dory, DO  glucose blood (ACCU-CHEK GUIDE) test strip Use daily to check blood sugar.  DX E11.65 04/25/23  Yes Wendling, Jilda Roche, DO  Krill Oil 500 MG CAPS Take 500 mg by mouth 2 (two) times daily.   Yes [provider]  metFORMIN (GLUCOPHAGE-XR) 500 MG 24 hr tablet Take 2 tablets (1,000 mg total) by mouth daily with breakfast. 05/11/23  Yes Wendling, Jilda Roche, DO  metoprolol tartrate (LOPRESSOR) 25 MG tablet Take 1 tablet (25 mg total) by mouth 2 (two) times daily. Patient taking differently: Take 25 mg by mouth every evening. 11/24/22  Yes Azalee Course, PA  Multiple Vitamin (MULTIVITAMIN WITH MINERALS) TABS tablet Take 1 tablet by mouth daily.   Yes [provider]  MYRBETRIQ 50 MG TB24 tablet Take 50 mg by mouth daily. 04/10/23  Yes [provider]  nitroGLYCERIN (NITROSTAT) 0.4 MG SL tablet Place 1 tablet (0.4 mg total) under the tongue every 5 (five) minutes as needed for chest pain. 04/26/22  Yes Sharlene Dory, DO  omeprazole (PRILOSEC) 40 MG capsule Take 1 capsule (40 mg total) by mouth daily before breakfast. 08/24/23  Yes Armbruster, Willaim Rayas, MD  ondansetron (ZOFRAN-ODT) 4 MG disintegrating tablet Take 4 mg by mouth every 8  (eight) hours as needed for nausea or vomiting.   Yes [provider]  pioglitazone (ACTOS) 45 MG tablet Take 1 tablet (45 mg total) by mouth daily. 05/11/23  Yes Sharlene Dory, DO  ramipril (ALTACE) 10 MG capsule TAKE 1 CAPSULE BY MOUTH TWICE  DAILY 12/06/22  Yes Lewayne Bunting, MD  tadalafil (CIALIS) 5 MG tablet Take 1 tablet (5 mg total) by mouth daily. Patient taking differently: Take 5 mg by mouth daily as needed for erectile dysfunction. 10/19/21  Yes Sharlene Dory, DO  traZODone (DESYREL) 50 MG tablet TAKE 1 TABLET BY MOUTH AT  BEDTIME AS NEEDED FOR SLEEP 10/14/22  Yes Wendling, Jilda Roche, DO  Accu-Chek FastClix Lancets MISC CHECK BLOOD SUGAR ONCE OR  TWICE WEEKLY 04/25/23   Sharlene Dory, DO  atorvastatin (LIPITOR) 80 MG tablet TAKE 1 TABLET BY MOUTH AT  BEDTIME Patient not taking: Reported on 08/27/2023 12/31/22   Sharlene Dory, DO  Continuous Glucose Sensor (FREESTYLE LIBRE 3 SENSOR) MISC Use to check blood sugar 01/26/23   Wendling, Jilda Roche, DO  Current Facility-Administered Medications  Medication Dose Route Frequency Provider Last Rate Last Admin   amLODipine (NORVASC) tablet 5 mg  5 mg Oral Daily Darreld Mclean R, MD       ibuprofen (ADVIL) tablet 400 mg  400 mg Oral Q6H PRN Charlsie Quest, MD       insulin aspart (novoLOG) injection 0-9 Units  0-9 Units Subcutaneous Q4H Charlsie Quest, MD   2 Units at 08/27/23 0401   metoprolol tartrate (LOPRESSOR) tablet 25 mg  25 mg Oral BID Charlsie Quest, MD       morphine (PF) 2 MG/ML injection 1 mg  1 mg Intravenous Q3H PRN Charlsie Quest, MD       ondansetron (ZOFRAN) tablet 4 mg  4 mg Oral Q6H PRN Charlsie Quest, MD       Or   ondansetron (ZOFRAN) injection 4 mg  4 mg Intravenous Q6H PRN Charlsie Quest, MD       piperacillin-tazobactam (ZOSYN) IVPB 3.375 g  3.375 g Intravenous Q8H Phylliss Blakes, RPH 12.5 mL/hr at 08/27/23 0400 3.375 g at 08/27/23 0400   sodium chloride  flush (NS) 0.9 % injection 3 mL  3 mL Intravenous Q12H Charlsie Quest, MD       Current Outpatient Medications  Medication Sig Dispense Refill   amLODipine (NORVASC) 5 MG tablet TAKE 1 TABLET BY MOUTH DAILY 90 tablet 3   ezetimibe (ZETIA) 10 MG tablet Take 1 tablet by mouth once daily 90 tablet 1   fenofibrate (TRICOR) 48 MG tablet TAKE 1 TABLET BY MOUTH DAILY 90 tablet 3   glucose blood (ACCU-CHEK GUIDE) test strip Use daily to check blood sugar.  DX E11.65 100 each 3   Krill Oil 500 MG CAPS Take 500 mg by mouth 2 (two) times daily.     metFORMIN (GLUCOPHAGE-XR) 500 MG 24 hr tablet Take 2 tablets (1,000 mg total) by mouth daily with breakfast.     metoprolol tartrate (LOPRESSOR) 25 MG tablet Take 1 tablet (25 mg total) by mouth 2 (two) times daily. (Patient taking differently: Take 25 mg by mouth every evening.) 180 tablet 3   Multiple Vitamin (MULTIVITAMIN WITH MINERALS) TABS tablet Take 1 tablet by mouth daily.     MYRBETRIQ 50 MG TB24 tablet Take 50 mg by mouth daily.     nitroGLYCERIN (NITROSTAT) 0.4 MG SL tablet Place 1 tablet (0.4 mg total) under the tongue every 5 (five) minutes as needed for chest pain. 25 tablet 3   omeprazole (PRILOSEC) 40 MG capsule Take 1 capsule (40 mg total) by mouth daily before breakfast. 30 capsule 3   ondansetron (ZOFRAN-ODT) 4 MG disintegrating tablet Take 4 mg by mouth every 8 (eight) hours as needed for nausea or vomiting.     pioglitazone (ACTOS) 45 MG tablet Take 1 tablet (45 mg total) by mouth daily. 90 tablet 3   ramipril (ALTACE) 10 MG capsule TAKE 1 CAPSULE BY MOUTH TWICE  DAILY 180 capsule 3   tadalafil (CIALIS) 5 MG tablet Take 1 tablet (5 mg total) by mouth daily. (Patient taking differently: Take 5 mg by mouth daily as needed for erectile dysfunction.) 30 tablet 11   traZODone (DESYREL) 50 MG tablet TAKE 1 TABLET BY MOUTH AT  BEDTIME AS NEEDED FOR SLEEP 90 tablet 3   Accu-Chek FastClix Lancets MISC CHECK BLOOD SUGAR ONCE OR  TWICE WEEKLY 102 each  3   atorvastatin (LIPITOR) 80 MG tablet TAKE 1 TABLET BY MOUTH AT  BEDTIME (Patient not taking: Reported on 08/27/2023) 90 tablet 3   Continuous Glucose Sensor (FREESTYLE LIBRE 3 SENSOR) MISC Use to check blood sugar 1 each 1    Allergies as of 08/26/2023 - Review Complete 08/26/2023  Allergen Reaction Noted   Protonix [pantoprazole] Other (See Comments) 08/23/2023    Family History  Problem Relation Age of Onset   Dementia Mother    Crohn's disease Mother    Heart disease Father 29       Valve replacement and CAD, CABG   Diabetes Father    CAD Sister 68   CAD Brother 84       Died age 96   Diabetes Brother    Kidney disease Brother    Colon cancer Neg Hx    Esophageal cancer Neg Hx    Stomach cancer Neg Hx    Rectal cancer Neg Hx    Pancreatic cancer Neg Hx     Social History   Socioeconomic History   Marital status: Significant Other    Spouse name: Not on file   Number of children: 2   Years of education: 18   Highest education level: Bachelor's degree (e.g., BA, AB, BS)  Occupational History   Occupation: Airline pilot- retired in 2018    Employer: [employer]  Tobacco Use   Smoking status: Former    Current packs/day: 0.00    Average packs/day: 1 pack/day for 45.0 years (45.0 ttl pk-yrs)    Types: Cigarettes    Start date: 01/16/1970    Quit date: 01/17/2015    Years since quitting: 8.6   Smokeless tobacco: Never  Vaping Use   Vaping status: Never Used  Substance and Sexual Activity   Alcohol use: Not Currently    Alcohol/week: 1.0 standard drink of alcohol    Types: 1 Glasses of wine per week    Comment: Previously 1 large vodka tonic per day Quit 06/2023   Drug use: No   Sexual activity: Yes  Other Topics Concern   Not on file  Social History Narrative   Fun: Boat, golf   Denies religious beliefs effecting health care.    Social Drivers of Corporate investment banker Strain: Low Risk  (08/20/2023)   Overall Financial Resource Strain (CARDIA)     Difficulty of Paying Living Expenses: Not hard at all  Food Insecurity: No Food Insecurity (08/20/2023)   Hunger Vital Sign    Worried About Running Out of Food in the Last Year: Never true    Ran Out of Food in the Last Year: Never true  Transportation Needs: No Transportation Needs (08/20/2023)   PRAPARE - Administrator, Civil Service (Medical): No    Lack of Transportation (Non-Medical): No  Physical Activity: Inactive (08/20/2023)   Exercise Vital Sign    Days of Exercise per Week: 0 days    Minutes of Exercise per Session: 90 min  Stress: Stress Concern Present (08/20/2023)   T[place] Boyer of Occupational Health - Occupational Stress Questionnaire    Feeling of Stress : To some extent  Social Connections: Socially Integrated (08/20/2023)   Social Connection and Isolation Panel [NHANES]    Frequency of Communication with Friends and Family: Twice a week    Frequency of Social Gatherings with Friends and Family: Once a week    Attends Religious Services: More than 4 times per year    Active Member of [employer] or Organizations: No    Attends Engineer, structural: More than  4 times per year    Marital Status: Living with partner  Intimate Partner Violence: Not At Risk (03/17/2022)   Humiliation, Afraid, Rape, and Kick questionnaire    Fear of Current or Ex-Partner: No    Emotionally Abused: No    Physically Abused: No    Sexually Abused: No    Review of Systems: Pertinent positive and negative review of systems were noted in the above HPI section.  All other review of systems was otherwise negative.  Physical Exam: Vital signs in last 24 hours: Temp:  [98.1 F (36.7 C)-105.4 F (40.8 C)] 98.9 F (37.2 C) (03/01 0703) Pulse Rate:  [70-102] 71 (03/01 0730) Resp:  [14-27] 19 (03/01 0730) BP: (88-169)/(45-142) 106/67 (03/01 0730) SpO2:  [94 %-98 %] 98 % (03/01 0730) Weight:  [77.1 kg] 77.1 kg (02/28 1553)   General:   Alert,  Well-developed, well-nourished  older white male, pleasant and cooperative in NAD, family at bedside Head:  Normocephalic and atraumatic. Eyes:  Sclera icteric conjunctiva pink. Ears:  Normal auditory acuity. Nose:  No deformity, discharge,  or lesions. Mouth:  No deformity or lesions.   Neck:  Supple; no masses or thyromegaly. Lungs:  Clear throughout to auscultation.   No wheezes, crackles, or rhonchi.  Heart:  Regular rate and rhythm; no murmurs, clicks, rubs,  or gallops. Abdomen:  Soft, there is tenderness in the epigastrium, no guarding or rebound no palpable mass or hepatosplenomegaly, bowel sounds are present Rectal: Not done Msk:  Symmetrical without gross deformities. . Pulses:  Normal pulses noted. Extremities:  Without clubbing or edema. Neurologic:  Alert and  oriented x4;  grossly normal neurologically. Skin:  Intact without significant lesions or rashes.. Psych:  Alert and cooperative. Normal mood and affect.  Intake/Output from previous day: 02/28 0701 - 03/01 0700 In: 1466.7 [IV Piggyback:1466.7] Out: -  Intake/Output this shift: No intake/output data recorded.  Lab Results: Recent Labs    08/26/23 1626 08/27/23 0551  WBC 10.5 11.8*  HGB 12.7* 10.5*  HCT 40.0 34.2*  PLT 255 183   BMET Recent Labs    08/26/23 1626 08/27/23 0551  NA 134* 139  K 3.7 3.6  CL 101 106  CO2 20* 25  GLUCOSE 187* 142*  BUN 11 17  CREATININE 0.65 1.01  CALCIUM 9.0 8.4*   LFT Recent Labs    08/24/23 1217 08/26/23 1626 08/27/23 0551  PROT 7.3   < > 5.8*  ALBUMIN 4.1   < > 2.8*  AST 276*   < > 252*  ALT 321*   < > 317*  ALKPHOS 490*   < > 390*  BILITOT 0.8   < > 5.6*  BILIDIR 0.2  --   --    < > = values in this interval not displayed.   PT/INR No results for input(s): "LABPROT", "INR" in the last 72 hours. Hepatitis Panel No results for input(s): "HEPBSAG", "HCVAB", "HEPAIGM", "HEPBIGM" in the last 72 hours.    IMPRESSION:  #8 73 year old white male with 6 to 7-week history of  intermittent right upper quadrant stabbing pain which has been occurring on a daily basis, intermittent nausea, decreased appetite and 24 pound weight loss. Over this past week he has had worsening of symptoms, intermittent fever and chills at home with temp in the 100 range.  Then started noticing darkening of urine.  Presented to the emergency room last p.m. with mild altered mental status and worsening fatigue/malaise  Patient is jaundiced, elevated LFTs but no  leukocytosis.  Had fever to 102 last night Concern for cholangitis.  Blood cultures now showing positive for gram-negative rods  CT imaging shows some soft tissue density in the gallbladder bed, dilated intra and extrahepatic bile ducts with common bile duct dilated down to the distal common bile duct where there is 1.7 [place] soft tissue density unclear whether this is a stone versus mass There is an indeterminant lesion in the right lobe of the liver  Liver lesion concerning for possible small abscess versus malignancy  2.  diabetes mellitus 3.  Status post cholecystectomy 4.  Coronary artery disease status post remote stents no anticoagulation 5.  Hypertension  Plan; keep n.p.o. Continue IV Zosyn Patient has been scheduled for ERCP with Dr. Marina Goodell for early this afternoon.  Procedure was discussed in detail with the patient and his wife including indications risks and benefits and he is agreeable to proceed.  We discussed potential complication of pancreatitis in about 4% of patients, perforation, bleeding and failure of procedure all of which are very rare but real.  Await blood cultures CA 19-9 Will need MRI of the liver  GI will follow closely with you   Amy EsterwoodPA-C  08/27/2023, 8:16 AM  GI ATTENDING  History, labs, X-rays, prior ERCP report all personally reviewed. Patient seen and examined. Agree with above comprehensive consultation note.  The patient presents with ascending cholangitis likely secondary to  recurrent CBD stones. Area in liver may represent abscess. Agree with antibiotics, ERCP today, The nature of the procedure, as well as the risks, benefits, and alternatives were carefully and thoroughly reviewed with the patient. Ample time for discussion and questions allowed. The patient understood, was satisfied, and agreed to proceed.and will need liver MRI.  Wilhemina Bonito. Eda Keys., M.D. [facility] Division of Gastroenterology

## 2023-08-27 NOTE — Progress Notes (Addendum)
 PROGRESS NOTE  Troy Boyer GNF:[id number] DOB: [birth year] DOA: 08/26/2023 PCP: Sharlene Dory, DO  HPI/Recap of past 24 hours: Troy Boyer is a 73 y.o. male with medical history significant for CAD s/p prior stenting, T2DM, HTN, HLD, s/p cholecystectomy, hx of choledocholithiasis who presented to the ED for evaluation of worsening RUQ abdominal pain X 2 weeks with associated nausea/vomiting, drenching night sweats, with weight loss. Pt was seen by his PCP PTA and was found to have newly elevated LFTs.  He was referred to GI and was seen in office by Dr. Adela Lank on 2/26.  In the ED, patient noted to be febrile, heart rate 90, otherwise unremarkable.  Labs showed AST 476, ALT 489, alk phos 550, total bilirubin 5.8, lipase 47, sodium 134, WBC 10.5. UA negative for UTI. SARS-CoV-2, influenza, RSV PCR negative. CT a/p with interval development, since 07/22/2023, of moderate biliary duct dilatation secondary to soft tissue density within the distal common duct. Most likely noncalcified choledocholithiasis. A soft tissue neoplasm such as extrahepatic cholangiocarcinoma could look similar but is felt less likely. Recommend further evaluation with ERCP. GI consulted.  Patient admitted for further management.    Today, saw patient prior to ERCP, still with abdominal pain, but denies any further nausea/vomiting.  No further fever noted.  Denies any chest pain, shortness of breath, diarrhea.    Assessment/Plan: Principal Problem:   Biliary obstruction Active Problems:   Essential hypertension   Mixed hyperlipidemia   CAD (coronary artery disease)   RUQ pain   Cholangitis   Type 2 diabetes mellitus (HCC)   Transaminitis   Abnormal CT of the abdomen   Choledocholithiasis   Possible ascending cholangitis Likely 2/2 recurrent CBD stones/choledocholithiasis Presented with Tmax of 105, currently afebrile, with mild leukocytosis CT shows moderate biliary duct dilatation secondary to  soft tissue density within distal common duct.  Suspected due to choledocholithiasis although soft tissue neoplasm possible GI consulted, status post ERCP on 08/27/2023 showed choledocholithiasis status post biliary sphincterotomy, balloon sphincteroplasty and CBD stone extraction, noted cholangitis, indeterminate liver lesion possibly abscess, rule out neoplasm GI recommended to continue antibiotics, trend labs, MRI of the liver to be done prior to discharge Trend LFTs Continue IV Zosyn, IV fluids Monitor closely  Possible E. coli bacteremia Likely source from above BC x 2, growing E. Coli, final result pending Continue IV Zosyn  Elevated LFTs Likely 2/2 cholangitis Avoid hepatotoxic medications Daily CMP  Coronary artery disease: Continue Lopressor, holding statin   Diabetes mellitus type 2 Last A1c 7.6 on 07/2023 SSI, Accu-Cheks, hypoglycemic protocol Holding home metformin and Actos   Hypertension Continue Lopressor for now, hold amlodipine pending bp   Hyperlipidemia Holding statin given transaminitis.    Estimated body mass index is 29.18 kg/m as calculated from the following:   Height as of this encounter: 5\' 4"  (1.626 m).   Weight as of this encounter: 77.1 kg.     Code Status: Full  Family Communication: Discussed with wife at bedside  Disposition Plan: Status is: Inpatient Remains inpatient appropriate because: Level of care      Consultants: GI  Procedures: ERCP  Antimicrobials: Zosyn  DVT prophylaxis: Lovenox   Objective: Vitals:   08/27/23 1205 08/27/23 1321 08/27/23 1458 08/27/23 1500  BP: (!) 118/53 (!) 108/55 137/71 132/62  Pulse: 65 68 75 75  Resp: 18 17 14 17   Temp:  98.7 F (37.1 C)    TempSrc:  Temporal    SpO2: 97% 98% 95% 99%  Weight:  Height:        Intake/Output Summary (Last 24 hours) at 08/27/2023 1515 Last data filed at 08/27/2023 1503 Gross per 24 hour  Intake 2266.67 ml  Output --  Net 2266.67 ml   Filed  Weights   08/26/23 1553  Weight: 77.1 kg    Exam: General: NAD  Cardiovascular: S1, S2 present Respiratory: CTAB Abdomen: Soft, tender, nondistended, bowel sounds present Musculoskeletal: No bilateral pedal edema noted Skin: Normal Psychiatry: Normal mood    Data Reviewed: CBC: Recent Labs  Lab 08/22/23 1537 08/26/23 1626 08/27/23 0551  WBC 7.2 10.5 11.8*  NEUTROABS 5.0  --   --   HGB 12.0* 12.7* 10.5*  HCT 38.0* 40.0 34.2*  MCV 91.9 91.7 95.3  PLT 291.0 255 183   Basic Metabolic Panel: Recent Labs  Lab 08/22/23 1537 08/26/23 1626 08/27/23 0551  NA 139 134* 139  K 4.0 3.7 3.6  CL 104 101 106  CO2 27 20* 25  GLUCOSE 203* 187* 142*  BUN 14 11 17   CREATININE 0.92 0.65 1.01  CALCIUM 8.8 9.0 8.4*   GFR: Estimated Creatinine Clearance: 63 mL/min (by C-G formula based on SCr of 1.01 mg/dL). Liver Function Tests: Recent Labs  Lab 08/22/23 1537 08/24/23 1217 08/26/23 1626 08/27/23 0551  AST 222* 276* 476* 252*  ALT 241* 321* 489* 317*  ALKPHOS 432* 490* 550* 390*  BILITOT 0.8 0.8 5.8* 5.6*  PROT 6.5 7.3 7.3 5.8*  ALBUMIN 4.0 4.1 3.6 2.8*   Recent Labs  Lab 08/26/23 1626  LIPASE 47   Recent Labs  Lab 08/26/23 1626  AMMONIA 72*   Coagulation Profile: Recent Labs  Lab 08/27/23 0945  INR 1.1   Cardiac Enzymes: No results for input(s): "CKTOTAL", "CKMB", "CKMBINDEX", "TROPONINI" in the last 168 hours. BNP (last 3 results) No results for input(s): "PROBNP" in the last 8760 hours. HbA1C: No results for input(s): "HGBA1C" in the last 72 hours. CBG: Recent Labs  Lab 08/27/23 0004 08/27/23 0400 08/27/23 0743 08/27/23 1141  GLUCAP 207* 158* 112* 136*   Lipid Profile: No results for input(s): "CHOL", "HDL", "LDLCALC", "TRIG", "CHOLHDL", "LDLDIRECT" in the last 72 hours. Thyroid Function Tests: No results for input(s): "TSH", "T4TOTAL", "FREET4", "T3FREE", "THYROIDAB" in the last 72 hours. Anemia Panel: No results for input(s): "VITAMINB12",  "FOLATE", "FERRITIN", "TIBC", "IRON", "RETICCTPCT" in the last 72 hours. Urine analysis:    Component Value Date/Time   COLORURINE AMBER (A) 08/26/2023 1800   APPEARANCEUR CLEAR 08/26/2023 1800   LABSPEC 1.017 08/26/2023 1800   PHURINE 6.0 08/26/2023 1800   GLUCOSEU 50 (A) 08/26/2023 1800   GLUCOSEU 500 (A) 03/15/2019 1516   HGBUR NEGATIVE 08/26/2023 1800   BILIRUBINUR SMALL (A) 08/26/2023 1800   BILIRUBINUR neg 12/22/2021 1146   KETONESUR NEGATIVE 08/26/2023 1800   PROTEINUR NEGATIVE 08/26/2023 1800   UROBILINOGEN 0.2 12/22/2021 1146   UROBILINOGEN 0.2 03/15/2019 1516   NITRITE NEGATIVE 08/26/2023 1800   LEUKOCYTESUR NEGATIVE 08/26/2023 1800   Sepsis Labs: @LABRCNTIP (procalcitonin:4,lacticidven:4)  ) Recent Results (from the past 240 hours)  Resp panel by RT-PCR (RSV, Flu A&B, Covid) Anterior Nasal Swab     Status: None   Collection Time: 08/26/23  4:24 PM   Specimen: Anterior Nasal Swab  Result Value Ref Range Status   SARS Coronavirus 2 by RT PCR NEGATIVE NEGATIVE Final    Comment: (NOTE) SARS-CoV-2 target nucleic acids are NOT DETECTED.  The SARS-CoV-2 RNA is generally detectable in upper respiratory specimens during the acute phase of infection. The lowest  concentration of SARS-CoV-2 viral copies this assay can detect is 138 copies/mL. A negative result does not preclude SARS-Cov-2 infection and should not be used as the sole basis for treatment or other patient management decisions. A negative result may occur with  improper specimen collection/handling, submission of specimen other than nasopharyngeal swab, presence of viral mutation(s) within the areas targeted by this assay, and inadequate number of viral copies(<138 copies/mL). A negative result must be combined with clinical observations, patient history, and epidemiological information. The expected result is Negative.  Fact Sheet for Patients:  BloggerCourse.com  Fact Sheet for  Healthcare Providers:  SeriousBroker.it  This test is no t yet approved or cleared by the [place] FDA and  has been authorized for detection and/or diagnosis of SARS-CoV-2 by FDA under an Emergency Use Authorization (EUA). This EUA will remain  in effect (meaning this test can be used) for the duration of the COVID-19 declaration under Section 564(b)(1) of the Act, 21 U.S.C.section 360bbb-3(b)(1), unless the authorization is terminated  or revoked sooner.       Influenza A by PCR NEGATIVE NEGATIVE Final   Influenza B by PCR NEGATIVE NEGATIVE Final    Comment: (NOTE) The Xpert Xpress SARS-CoV-2/FLU/RSV plus assay is intended as an aid in the diagnosis of influenza from Nasopharyngeal swab specimens and should not be used as a sole basis for treatment. Nasal washings and aspirates are unacceptable for Xpert Xpress SARS-CoV-2/FLU/RSV testing.  Fact Sheet for Patients: BloggerCourse.com  Fact Sheet for Healthcare Providers: SeriousBroker.it  This test is not yet approved or cleared by the [place] FDA and has been authorized for detection and/or diagnosis of SARS-CoV-2 by FDA under an Emergency Use Authorization (EUA). This EUA will remain in effect (meaning this test can be used) for the duration of the COVID-19 declaration under Section 564(b)(1) of the Act, 21 U.S.C. section 360bbb-3(b)(1), unless the authorization is terminated or revoked.     Resp Syncytial Virus by PCR NEGATIVE NEGATIVE Final    Comment: (NOTE) Fact Sheet for Patients: BloggerCourse.com  Fact Sheet for Healthcare Providers: SeriousBroker.it  This test is not yet approved or cleared by the [place] FDA and has been authorized for detection and/or diagnosis of SARS-CoV-2 by FDA under an Emergency Use Authorization (EUA). This EUA will remain in effect (meaning this  test can be used) for the duration of the COVID-19 declaration under Section 564(b)(1) of the Act, 21 U.S.C. section 360bbb-3(b)(1), unless the authorization is terminated or revoked.  Performed at [facility], [address] [place], [place] [address]   Culture, blood (routine x 2)     Status: None (Preliminary result)   Collection Time: 08/26/23  7:30 PM   Specimen: BLOOD LEFT ARM  Result Value Ref Range Status   Specimen Description   Final    BLOOD LEFT ARM Performed at [facility], [address], [place], [place] [address]    Special Requests   Final    BOTTLES DRAWN AEROBIC AND ANAEROBIC Blood Culture adequate volume Performed at [facility], [address] [place], [place] [address]    Culture  Setup Time   Final    GRAM NEGATIVE RODS ANAEROBIC BOTTLE ONLY CRITICAL RESULT CALLED TO, READ BACK BY AND VERIFIED WITH: Nathanial Millman [facility] [id number] AT [id number] BY EC Performed at [facility], [address], [place], [place] [address]    Culture GRAM NEGATIVE RODS  Final   Report Status PENDING  Incomplete  Blood Culture ID Panel (Reflexed)     Status: Abnormal   Collection Time: 08/26/23  7:30 PM  Result Value Ref Range Status   Enterococcus faecalis NOT DETECTED NOT DETECTED Final   Enterococcus Faecium NOT DETECTED NOT DETECTED Final   Listeria monocytogenes NOT DETECTED NOT DETECTED Final   Staphylococcus species NOT DETECTED NOT DETECTED Final   Staphylococcus aureus (BCID) NOT DETECTED NOT DETECTED Final   Staphylococcus epidermidis NOT DETECTED NOT DETECTED Final   Staphylococcus lugdunensis NOT DETECTED NOT DETECTED Final   Streptococcus species NOT DETECTED NOT DETECTED Final   Streptococcus agalactiae NOT DETECTED NOT DETECTED Final   Streptococcus pneumoniae NOT DETECTED NOT DETECTED Final   Streptococcus pyogenes NOT DETECTED NOT DETECTED Final   A.calcoaceticus-baumannii NOT DETECTED NOT DETECTED Final    Bacteroides fragilis NOT DETECTED NOT DETECTED Final   Enterobacterales DETECTED (A) NOT DETECTED Final    Comment: Enterobacterales represent a large order of gram negative bacteria, not a single organism. CRITICAL RESULT CALLED TO, READ BACK BY AND VERIFIED WITH: PHARMD DREW WOFFORD [id number] AT 0939 BY EC    Enterobacter cloacae complex NOT DETECTED NOT DETECTED Final   Escherichia coli DETECTED (A) NOT DETECTED Final    Comment: CRITICAL RESULT CALLED TO, READ BACK BY AND VERIFIED WITH: PHARMD DREW WOFFORD [id number] AT 0939 BY EC    Klebsiella aerogenes NOT DETECTED NOT DETECTED Final   Klebsiella oxytoca NOT DETECTED NOT DETECTED Final   Klebsiella pneumoniae NOT DETECTED NOT DETECTED Final   Proteus species NOT DETECTED NOT DETECTED Final   Salmonella species NOT DETECTED NOT DETECTED Final   Serratia marcescens NOT DETECTED NOT DETECTED Final   Haemophilus influenzae NOT DETECTED NOT DETECTED Final   Neisseria meningitidis NOT DETECTED NOT DETECTED Final   Pseudomonas aeruginosa NOT DETECTED NOT DETECTED Final   Stenotrophomonas maltophilia NOT DETECTED NOT DETECTED Final   Candida albicans NOT DETECTED NOT DETECTED Final   Candida auris NOT DETECTED NOT DETECTED Final   Candida glabrata NOT DETECTED NOT DETECTED Final   Candida krusei NOT DETECTED NOT DETECTED Final   Candida parapsilosis NOT DETECTED NOT DETECTED Final   Candida tropicalis NOT DETECTED NOT DETECTED Final   Cryptococcus neoformans/gattii NOT DETECTED NOT DETECTED Final   CTX-M ESBL NOT DETECTED NOT DETECTED Final   Carbapenem resistance IMP NOT DETECTED NOT DETECTED Final   Carbapenem resistance KPC NOT DETECTED NOT DETECTED Final   Carbapenem resistance NDM NOT DETECTED NOT DETECTED Final   Carbapenem resist OXA 48 LIKE NOT DETECTED NOT DETECTED Final   Carbapenem resistance VIM NOT DETECTED NOT DETECTED Final    Comment: Performed at [facility], [address] [place], [place] [address]  Culture,  blood (routine x 2)     Status: None (Preliminary result)   Collection Time: 08/26/23  7:35 PM   Specimen: BLOOD RIGHT ARM  Result Value Ref Range Status   Specimen Description   Final    BLOOD RIGHT ARM Performed at [facility], [address], [place], [place] [address]    Special Requests   Final    BOTTLES DRAWN AEROBIC AND ANAEROBIC Blood Culture adequate volume Performed at [facility], [address], [place], [place] [address]    Culture  Setup Time   Final    GRAM NEGATIVE RODS ANAEROBIC BOTTLE ONLY CRITICAL VALUE NOTED.  VALUE IS CONSISTENT WITH PREVIOUSLY REPORTED AND CALLED VALUE. Performed at [facility], [address] [place], [place] [address]  Culture GRAM NEGATIVE RODS  Final   Report Status PENDING  Incomplete      Studies: CT ABDOMEN PELVIS W CONTRAST Result Date: 08/26/2023 CLINICAL DATA:  One-month history of right upper quadrant pain. Weakness. Elevated liver function tests. EXAM: CT ABDOMEN AND PELVIS WITH CONTRAST TECHNIQUE: Multidetector CT imaging of the abdomen and pelvis was performed using the standard protocol following bolus administration of intravenous contrast. RADIATION DOSE REDUCTION: This exam was performed according to the departmental dose-optimization program which includes automated exposure control, adjustment of the mA and/or kV according to patient size and/or use of iterative reconstruction technique. CONTRAST:  OMNIPAQUE IOHEXOL 300 MG/ML  SOLN COMPARISON:  Lung cancer screening CT 07/22/2023. No prior abdominal CTs available. A report of an abdominopelvic CT of 07/26/2001 is reviewed. FINDINGS: Lower chest: Clear lung bases. Mitral and aortic valve calcification. Coronary artery calcification. Hepatobiliary: Posterior right hepatic lobe hypoattenuating 2.5 x 2.3 cm lesion on 20/2. Cholecystectomy. Hypoattenuation in the operative bed including up to 2.1 cm in 25/2 could represent a combination of focal  steatosis and possibly a gallbladder or cystic duct remnant. No surrounding inflammation. Since 07/22/2023, development of moderate intra and extrahepatic biliary duct dilatation. Common duct dilatation is followed to the level of a 1.7 cm soft tissue density within the distal common duct including on coronal image 67. Pancreas: Normal, without mass or ductal dilatation. Spleen: Normal in size, without focal abnormality. Adrenals/Urinary Tract: Mild right adrenal thickening. Interpolar right renal 3.3 cm cyst. Bilateral too small to characterize renal lesions are most likely cysts . In the absence of clinically indicated signs/symptoms require(s) no independent follow-up. No hydronephrosis. Apparent mild bladder wall thickening is favored to be due to underdistention. Stomach/Bowel: Normal stomach, without wall thickening. Extensive colonic diverticulosis. Normal terminal ileum and appendix. Normal small bowel. Vascular/Lymphatic: Advanced aortic and branch vessel atherosclerosis. No abdominopelvic adenopathy. Reproductive: Mild prostatomegaly. Other: No significant free fluid. Small bilateral fat containing inguinal hernias. Musculoskeletal: No acute osseous abnormality. IMPRESSION: 1. Interval development, since 07/22/2023, of moderate biliary duct dilatation secondary to soft tissue density within the distal common duct. Most likely noncalcified choledocholithiasis. A soft tissue neoplasm such as extrahepatic cholangiocarcinoma could look similar but is felt less likely. Recommend further evaluation with ERCP. 2. Right hepatic lobe hypoattenuating lesion is indeterminate. Correlate with any history of primary malignancy. Consider further evaluation with outpatient, nonemergent pre and post contrast abdominal MRI. 3. Cholecystectomy. Hypoattenuation in the region of the operative bed could represent focal hepatic steatosis and/or a gallbladder/cystic duct remnant. No surrounding inflammation to suggest acute  clinical significance. 4. Incidental findings, including: Coronary artery atherosclerosis. Aortic Atherosclerosis (ICD10-I70.0). Mitral and aortic valvular calcifications for which echocardiography should be considered. Prostatomegaly. Electronically Signed   By: Jeronimo Greaves M.D.   On: 08/26/2023 21:04    Scheduled Meds:  [MAR Hold] amLODipine  5 mg Oral Daily   [MAR Hold] diclofenac  100 mg Rectal Once   [MAR Hold] insulin aspart  0-9 Units Subcutaneous Q4H   [MAR Hold] metoprolol tartrate  25 mg Oral BID   [MAR Hold] sodium chloride flush  3 mL Intravenous Q12H    Continuous Infusions:  [MAR Hold] piperacillin-tazobactam (ZOSYN)  IV 3.375 g (08/27/23 1203)     LOS: 1 day     Briant Cedar, MD [facility]  If 7PM-7AM, please contact night-coverage [email or web address] 08/27/2023, 3:15 PM

## 2023-08-27 NOTE — Anesthesia Preprocedure Evaluation (Addendum)
 Anesthesia Evaluation  Patient identified by MRN, date of birth, ID band Patient awake    Reviewed: Allergy & Precautions, NPO status , Patient's Chart, lab work & pertinent test results  Airway Mallampati: II  TM Distance: >3 FB Neck ROM: Full    Dental no notable dental hx.    Pulmonary former smoker   Pulmonary exam normal        Cardiovascular hypertension, Pt. on medications and Pt. on home beta blockers + CAD and + Past MI   Rhythm:Regular Rate:Normal     Neuro/Psych negative neurological ROS  negative psych ROS   GI/Hepatic Neg liver ROS,GERD  Medicated,,CBD stones    Endo/Other  diabetes    Renal/GU   negative genitourinary   Musculoskeletal negative musculoskeletal ROS (+)    Abdominal Normal abdominal exam  (+)   Peds  Hematology Lab Results      Component                Value               Date                      WBC                      11.8 (H)            08/27/2023                HGB                      10.5 (L)            08/27/2023                HCT                      34.2 (L)            08/27/2023                MCV                      95.3                08/27/2023                PLT                      183                 08/27/2023              Anesthesia Other Findings   Reproductive/Obstetrics                             Anesthesia Physical Anesthesia Plan  ASA: 3  Anesthesia Plan: General   Post-op Pain Management:    Induction: Intravenous  PONV Risk Score and Plan: 2 and Ondansetron, Dexamethasone and Treatment may vary due to age or medical condition  Airway Management Planned: Mask and Oral ETT  Additional Equipment: None  Intra-op Plan:   Post-operative Plan: Extubation in OR  Informed Consent: I have reviewed the patients History and Physical, chart, labs and discussed the procedure including the risks, benefits and alternatives for  the proposed anesthesia with the patient or authorized representative who has  indicated his/her understanding and acceptance.     Dental advisory given  Plan Discussed with: CRNA  Anesthesia Plan Comments:        Anesthesia Quick Evaluation

## 2023-08-27 NOTE — Progress Notes (Signed)
 PHARMACY - PHYSICIAN COMMUNICATION CRITICAL VALUE ALERT - BLOOD CULTURE IDENTIFICATION (BCID)  Troy Boyer is an 73 y.o. male who presented to [facility] on 08/26/2023 with a chief complaint of RUQ pain/fever; found to have biliary obstruction  Assessment:  2/2 BCx (2/4 bottles) growing E coli (no common ESBL genes identified); suspect biliary/GI source  Name of physician (or Provider) Contacted: Troy Boyer  Current antibiotics: Zosyn  Changes to prescribed antibiotics recommended: narrow to Rocephin/Flagyl Provider anticipates narrowing; needs to see patient first  Results for orders placed or performed during the hospital encounter of 08/26/23  Blood Culture ID Panel (Reflexed) (Collected: 08/26/2023  7:30 PM)  Result Value Ref Range   Enterococcus faecalis NOT DETECTED NOT DETECTED   Enterococcus Faecium NOT DETECTED NOT DETECTED   Listeria monocytogenes NOT DETECTED NOT DETECTED   Staphylococcus species NOT DETECTED NOT DETECTED   Staphylococcus aureus (BCID) NOT DETECTED NOT DETECTED   Staphylococcus epidermidis NOT DETECTED NOT DETECTED   Staphylococcus lugdunensis NOT DETECTED NOT DETECTED   Streptococcus species NOT DETECTED NOT DETECTED   Streptococcus agalactiae NOT DETECTED NOT DETECTED   Streptococcus pneumoniae NOT DETECTED NOT DETECTED   Streptococcus pyogenes NOT DETECTED NOT DETECTED   A.calcoaceticus-baumannii NOT DETECTED NOT DETECTED   Bacteroides fragilis NOT DETECTED NOT DETECTED   Enterobacterales DETECTED (A) NOT DETECTED   Enterobacter cloacae complex NOT DETECTED NOT DETECTED   Escherichia coli DETECTED (A) NOT DETECTED   Klebsiella aerogenes NOT DETECTED NOT DETECTED   Klebsiella oxytoca NOT DETECTED NOT DETECTED   Klebsiella pneumoniae NOT DETECTED NOT DETECTED   Proteus species NOT DETECTED NOT DETECTED   Salmonella species NOT DETECTED NOT DETECTED   Serratia marcescens NOT DETECTED NOT DETECTED   Haemophilus influenzae NOT DETECTED NOT DETECTED    Neisseria meningitidis NOT DETECTED NOT DETECTED   Pseudomonas aeruginosa NOT DETECTED NOT DETECTED   Stenotrophomonas maltophilia NOT DETECTED NOT DETECTED   Candida albicans NOT DETECTED NOT DETECTED   Candida auris NOT DETECTED NOT DETECTED   Candida glabrata NOT DETECTED NOT DETECTED   Candida krusei NOT DETECTED NOT DETECTED   Candida parapsilosis NOT DETECTED NOT DETECTED   Candida tropicalis NOT DETECTED NOT DETECTED   Cryptococcus neoformans/gattii NOT DETECTED NOT DETECTED   CTX-M ESBL NOT DETECTED NOT DETECTED   Carbapenem resistance IMP NOT DETECTED NOT DETECTED   Carbapenem resistance KPC NOT DETECTED NOT DETECTED   Carbapenem resistance NDM NOT DETECTED NOT DETECTED   Carbapenem resist OXA 48 LIKE NOT DETECTED NOT DETECTED   Carbapenem resistance VIM NOT DETECTED NOT DETECTED    Troy Boyer A 08/27/2023  11:13 AM

## 2023-08-27 NOTE — Transfer of Care (Signed)
 Immediate Anesthesia Transfer of Care Note  Patient: Troy Boyer  Procedure(s) Performed: ENDOSCOPIC RETROGRADE CHOLANGIOPANCREATOGRAPHY (ERCP)  Patient Location: Endoscopy Unit  Anesthesia Type:General  Level of Consciousness: drowsy and patient cooperative  Airway & Oxygen Therapy: Patient Spontanous Breathing  Post-op Assessment: Report given to RN and Post -op Vital signs reviewed and stable  Post vital signs: Reviewed and stable  Last Vitals:  Vitals Value Taken Time  BP 132/62 08/27/23 1500  Temp    Pulse 71 08/27/23 1502  Resp 16 08/27/23 1502  SpO2 92 % 08/27/23 1502  Vitals shown include unfiled device data.  Last Pain:  Vitals:   08/27/23 1458  TempSrc:   PainSc: 0-No pain         Complications: No notable events documented.

## 2023-08-28 ENCOUNTER — Inpatient Hospital Stay (HOSPITAL_COMMUNITY)

## 2023-08-28 DIAGNOSIS — K831 Obstruction of bile duct: Secondary | ICD-10-CM | POA: Diagnosis not present

## 2023-08-28 DIAGNOSIS — K8309 Other cholangitis: Secondary | ICD-10-CM

## 2023-08-28 DIAGNOSIS — K769 Liver disease, unspecified: Secondary | ICD-10-CM | POA: Diagnosis not present

## 2023-08-28 LAB — CBC WITH DIFFERENTIAL/PLATELET
Abs Immature Granulocytes: 0.05 10*3/uL (ref 0.00–0.07)
Basophils Absolute: 0 10*3/uL (ref 0.0–0.1)
Basophils Relative: 0 %
Eosinophils Absolute: 0 10*3/uL (ref 0.0–0.5)
Eosinophils Relative: 0 %
HCT: 34.4 % — ABNORMAL LOW (ref 39.0–52.0)
Hemoglobin: 10.7 g/dL — ABNORMAL LOW (ref 13.0–17.0)
Immature Granulocytes: 1 %
Lymphocytes Relative: 13 %
Lymphs Abs: 1.2 10*3/uL (ref 0.7–4.0)
MCH: 29.5 pg (ref 26.0–34.0)
MCHC: 31.1 g/dL (ref 30.0–36.0)
MCV: 94.8 fL (ref 80.0–100.0)
Monocytes Absolute: 0.9 10*3/uL (ref 0.1–1.0)
Monocytes Relative: 10 %
Neutro Abs: 7 10*3/uL (ref 1.7–7.7)
Neutrophils Relative %: 76 %
Platelets: 204 10*3/uL (ref 150–400)
RBC: 3.63 MIL/uL — ABNORMAL LOW (ref 4.22–5.81)
RDW: 16.3 % — ABNORMAL HIGH (ref 11.5–15.5)
WBC: 9.1 10*3/uL (ref 4.0–10.5)
nRBC: 0 % (ref 0.0–0.2)

## 2023-08-28 LAB — COMPREHENSIVE METABOLIC PANEL
ALT: 217 U/L — ABNORMAL HIGH (ref 0–44)
AST: 97 U/L — ABNORMAL HIGH (ref 15–41)
Albumin: 2.7 g/dL — ABNORMAL LOW (ref 3.5–5.0)
Alkaline Phosphatase: 335 U/L — ABNORMAL HIGH (ref 38–126)
Anion gap: 9 (ref 5–15)
BUN: 26 mg/dL — ABNORMAL HIGH (ref 8–23)
CO2: 23 mmol/L (ref 22–32)
Calcium: 8.4 mg/dL — ABNORMAL LOW (ref 8.9–10.3)
Chloride: 105 mmol/L (ref 98–111)
Creatinine, Ser: 1.05 mg/dL (ref 0.61–1.24)
GFR, Estimated: >60 mL/min (ref >60)
Glucose, Bld: 245 mg/dL — ABNORMAL HIGH (ref 70–99)
Potassium: 4.4 mmol/L (ref 3.5–5.1)
Sodium: 137 mmol/L (ref 135–145)
Total Bilirubin: 2 mg/dL — ABNORMAL HIGH (ref 0.0–1.2)
Total Protein: 5.6 g/dL — ABNORMAL LOW (ref 6.5–8.1)

## 2023-08-28 LAB — GLUCOSE, CAPILLARY
Glucose-Capillary: 203 mg/dL — ABNORMAL HIGH (ref 70–99)
Glucose-Capillary: 303 mg/dL — ABNORMAL HIGH (ref 70–99)
Glucose-Capillary: 256 mg/dL — ABNORMAL HIGH (ref 70–99)
Glucose-Capillary: 191 mg/dL — ABNORMAL HIGH (ref 70–99)

## 2023-08-28 LAB — CANCER ANTIGEN 19-9: CA 19-9: 518 U/mL — ABNORMAL HIGH (ref 0–35)

## 2023-08-28 MED ORDER — INSULIN ASPART 100 UNIT/ML IJ SOLN
0.0000 [IU] | Freq: Three times a day (TID) | INTRAMUSCULAR | Status: DC
  Administered 2023-08-29: 3 [IU] via SUBCUTANEOUS
  Administered 2023-08-29: 8 [IU] via SUBCUTANEOUS
  Administered 2023-08-30: 3 [IU] via SUBCUTANEOUS

## 2023-08-28 MED ORDER — GADOBUTROL 1 MMOL/ML IV SOLN
8.0000 mL | Freq: Once | INTRAVENOUS | Status: AC | PRN
  Administered 2023-08-28: 8 mL via INTRAVENOUS

## 2023-08-28 NOTE — Progress Notes (Signed)
   08/28/23 1253  TOC Brief Assessment  Insurance and Status Reviewed  Patient has primary care physician Yes  Home environment has been reviewed home with significant other  Prior level of function: mod indenpendence  Prior/Current Home Services No current home services  Social Drivers of Health Review SDOH reviewed no interventions necessary  Readmission risk has been reviewed Yes  Transition of care needs transition of care needs identified, TOC will continue to follow

## 2023-08-28 NOTE — Plan of Care (Signed)

## 2023-08-28 NOTE — Progress Notes (Signed)
 PROGRESS NOTE  Troy Boyer UXL:[id number] DOB: Nov 22, 1950 DOA: 08/26/2023 PCP: Sharlene Dory, DO  HPI/Recap of past 24 hours: Troy Boyer is a 73 y.o. male with medical history significant for CAD s/p prior stenting, T2DM, HTN, HLD, s/p cholecystectomy, hx of choledocholithiasis who presented to the ED for evaluation of worsening RUQ abdominal pain X 2 weeks with associated nausea/vomiting, drenching night sweats, with weight loss. Pt was seen by his PCP PTA and was found to have newly elevated LFTs.  He was referred to GI and was seen in office by Dr. Adela Lank on 2/26.  In the ED, patient noted to be febrile, heart rate 90, otherwise unremarkable.  Labs showed AST 476, ALT 489, alk phos 550, total bilirubin 5.8, lipase 47, sodium 134, WBC 10.5. UA negative for UTI. SARS-CoV-2, influenza, RSV PCR negative. CT a/p with interval development, since 07/22/2023, of moderate biliary duct dilatation secondary to soft tissue density within the distal common duct. Most likely noncalcified choledocholithiasis. A soft tissue neoplasm such as extrahepatic cholangiocarcinoma could look similar but is felt less likely. Recommend further evaluation with ERCP. GI consulted.  Patient admitted for further management.    Today, patient denies any new complaints, reports improved abdominal pain but denies any further nausea/vomiting.    Assessment/Plan: Principal Problem:   Biliary obstruction Active Problems:   Essential hypertension   Mixed hyperlipidemia   CAD (coronary artery disease)   RUQ pain   Cholangitis   Type 2 diabetes mellitus (HCC)   Transaminitis   Abnormal CT of the abdomen   Choledocholithiasis   Ascending cholangitis Likely 2/2 recurrent CBD stones/choledocholithiasis Presented with Tmax of 105, currently afebrile, with resolved leukocytosis CT shows moderate biliary duct dilatation secondary to soft tissue density within distal common duct.  Suspected due to  choledocholithiasis although soft tissue neoplasm possible GI consulted, status post ERCP on 08/27/2023 showed choledocholithiasis status post biliary sphincterotomy, balloon sphincteroplasty and CBD stone extraction, noted cholangitis, indeterminate liver lesion possibly abscess, rule out  Ca 19-9-->518 GI recommended to continue antibiotics, trend labs, MRI of the liver pending Trend LFTs Continue IV Zosyn Monitor closely  Possible E. coli bacteremia Likely source from above BC x 2, growing E. Coli, final result pending Continue IV Zosyn  Elevated LFTs Likely 2/2 cholangitis Avoid hepatotoxic medications Daily CMP  Coronary artery disease: Continue Lopressor, holding statin   Diabetes mellitus type 2, with hyperglycemia Last A1c 7.6 on 07/2023 SSI, Accu-Cheks, hypoglycemic protocol Holding home metformin and Actos   Hypertension Hold Lopressor, amlodipine pending bp   Hyperlipidemia Holding statin given transaminitis.    Estimated body mass index is 29.18 kg/m as calculated from the following:   Height as of this encounter: 5\' 4"  (1.626 m).   Weight as of this encounter: 77.1 kg.     Code Status: Full  Family Communication: None at bedside  Disposition Plan: Status is: Inpatient Remains inpatient appropriate because: Level of care      Consultants: GI  Procedures: ERCP  Antimicrobials: Zosyn  DVT prophylaxis: Lovenox   Objective: Vitals:   08/27/23 2345 08/28/23 0409 08/28/23 0820 08/28/23 1520  BP: (!) 106/56 138/74 (!) 127/59 117/72  Pulse: 64 81 88 68  Resp: 15 15 16 16   Temp: 98.5 F (36.9 C) 97.8 F (36.6 C) 98.1 F (36.7 C) 98.5 F (36.9 C)  TempSrc:   Oral Oral  SpO2: 97% 100% 97% 100%  Weight:      Height:        Intake/Output Summary (  Last 24 hours) at 08/28/2023 1700 Last data filed at 08/28/2023 1328 Gross per 24 hour  Intake 2179.99 ml  Output 70 ml  Net 2109.99 ml   Filed Weights   08/26/23 1553  Weight: 77.1 kg     Exam: General: NAD  Cardiovascular: S1, S2 present Respiratory: CTAB Abdomen: Soft, nontender, nondistended, bowel sounds present Musculoskeletal: No bilateral pedal edema noted Skin: Normal Psychiatry: Normal mood    Data Reviewed: CBC: Recent Labs  Lab 08/22/23 1537 08/26/23 1626 08/27/23 0551 08/28/23 0359  WBC 7.2 10.5 11.8* 9.1  NEUTROABS 5.0  --   --  7.0  HGB 12.0* 12.7* 10.5* 10.7*  HCT 38.0* 40.0 34.2* 34.4*  MCV 91.9 91.7 95.3 94.8  PLT 291.0 255 183 204   Basic Metabolic Panel: Recent Labs  Lab 08/22/23 1537 08/26/23 1626 08/27/23 0551 08/28/23 0359  NA 139 134* 139 137  K 4.0 3.7 3.6 4.4  CL 104 101 106 105  CO2 27 20* 25 23  GLUCOSE 203* 187* 142* 245*  BUN 14 11 17  26*  CREATININE 0.92 0.65 1.01 1.05  CALCIUM 8.8 9.0 8.4* 8.4*   GFR: Estimated Creatinine Clearance: 60.6 mL/min (by C-G formula based on SCr of 1.05 mg/dL). Liver Function Tests: Recent Labs  Lab 08/22/23 1537 08/24/23 1217 08/26/23 1626 08/27/23 0551 08/28/23 0359  AST 222* 276* 476* 252* 97*  ALT 241* 321* 489* 317* 217*  ALKPHOS 432* 490* 550* 390* 335*  BILITOT 0.8 0.8 5.8* 5.6* 2.0*  PROT 6.5 7.3 7.3 5.8* 5.6*  ALBUMIN 4.0 4.1 3.6 2.8* 2.7*   Recent Labs  Lab 08/26/23 1626  LIPASE 47   Recent Labs  Lab 08/26/23 1626  AMMONIA 72*   Coagulation Profile: Recent Labs  Lab 08/27/23 0945  INR 1.1   Cardiac Enzymes: No results for input(s): "CKTOTAL", "CKMB", "CKMBINDEX", "TROPONINI" in the last 168 hours. BNP (last 3 results) No results for input(s): "PROBNP" in the last 8760 hours. HbA1C: No results for input(s): "HGBA1C" in the last 72 hours. CBG: Recent Labs  Lab 08/27/23 2111 08/27/23 2346 08/28/23 0730 08/28/23 1125 08/28/23 1612  GLUCAP 400* 267* 203* 303* 256*   Lipid Profile: No results for input(s): "CHOL", "HDL", "LDLCALC", "TRIG", "CHOLHDL", "LDLDIRECT" in the last 72 hours. Thyroid Function Tests: No results for input(s): "TSH",  "T4TOTAL", "FREET4", "T3FREE", "THYROIDAB" in the last 72 hours. Anemia Panel: No results for input(s): "VITAMINB12", "FOLATE", "FERRITIN", "TIBC", "IRON", "RETICCTPCT" in the last 72 hours. Urine analysis:    Component Value Date/Time   COLORURINE AMBER (A) 08/26/2023 1800   APPEARANCEUR CLEAR 08/26/2023 1800   LABSPEC 1.017 08/26/2023 1800   PHURINE 6.0 08/26/2023 1800   GLUCOSEU 50 (A) 08/26/2023 1800   GLUCOSEU 500 (A) 03/15/2019 1516   HGBUR NEGATIVE 08/26/2023 1800   BILIRUBINUR SMALL (A) 08/26/2023 1800   BILIRUBINUR neg 12/22/2021 1146   KETONESUR NEGATIVE 08/26/2023 1800   PROTEINUR NEGATIVE 08/26/2023 1800   UROBILINOGEN 0.2 12/22/2021 1146   UROBILINOGEN 0.2 03/15/2019 1516   NITRITE NEGATIVE 08/26/2023 1800   LEUKOCYTESUR NEGATIVE 08/26/2023 1800   Sepsis Labs: @LABRCNTIP (procalcitonin:4,lacticidven:4)  ) Recent Results (from the past 240 hours)  Resp panel by RT-PCR (RSV, Flu A&B, Covid) Anterior Nasal Swab     Status: None   Collection Time: 08/26/23  4:24 PM   Specimen: Anterior Nasal Swab  Result Value Ref Range Status   SARS Coronavirus 2 by RT PCR NEGATIVE NEGATIVE Final    Comment: (NOTE) SARS-CoV-2 target nucleic acids are NOT  DETECTED.  The SARS-CoV-2 RNA is generally detectable in upper respiratory specimens during the acute phase of infection. The lowest concentration of SARS-CoV-2 viral copies this assay can detect is 138 copies/mL. A negative result does not preclude SARS-Cov-2 infection and should not be used as the sole basis for treatment or other patient management decisions. A negative result may occur with  improper specimen collection/handling, submission of specimen other than nasopharyngeal swab, presence of viral mutation(s) within the areas targeted by this assay, and inadequate number of viral copies(<138 copies/mL). A negative result must be combined with clinical observations, patient history, and epidemiological information. The  expected result is Negative.  Fact Sheet for Patients:  BloggerCourse.com  Fact Sheet for Healthcare Providers:  SeriousBroker.it  This test is no t yet approved or cleared by the [place] FDA and  has been authorized for detection and/or diagnosis of SARS-CoV-2 by FDA under an Emergency Use Authorization (EUA). This EUA will remain  in effect (meaning this test can be used) for the duration of the COVID-19 declaration under Section 564(b)(1) of the Act, 21 U.S.C.section 360bbb-3(b)(1), unless the authorization is terminated  or revoked sooner.       Influenza A by PCR NEGATIVE NEGATIVE Final   Influenza B by PCR NEGATIVE NEGATIVE Final    Comment: (NOTE) The Xpert Xpress SARS-CoV-2/FLU/RSV plus assay is intended as an aid in the diagnosis of influenza from Nasopharyngeal swab specimens and should not be used as a sole basis for treatment. Nasal washings and aspirates are unacceptable for Xpert Xpress SARS-CoV-2/FLU/RSV testing.  Fact Sheet for Patients: BloggerCourse.com  Fact Sheet for Healthcare Providers: SeriousBroker.it  This test is not yet approved or cleared by the [place] FDA and has been authorized for detection and/or diagnosis of SARS-CoV-2 by FDA under an Emergency Use Authorization (EUA). This EUA will remain in effect (meaning this test can be used) for the duration of the COVID-19 declaration under Section 564(b)(1) of the Act, 21 U.S.C. section 360bbb-3(b)(1), unless the authorization is terminated or revoked.     Resp Syncytial Virus by PCR NEGATIVE NEGATIVE Final    Comment: (NOTE) Fact Sheet for Patients: BloggerCourse.com  Fact Sheet for Healthcare Providers: SeriousBroker.it  This test is not yet approved or cleared by the [place] FDA and has been authorized for detection and/or  diagnosis of SARS-CoV-2 by FDA under an Emergency Use Authorization (EUA). This EUA will remain in effect (meaning this test can be used) for the duration of the COVID-19 declaration under Section 564(b)(1) of the Act, 21 U.S.C. section 360bbb-3(b)(1), unless the authorization is terminated or revoked.  Performed at [facility], [address], [place], [place] [address]   Culture, blood (routine x 2)     Status: Abnormal (Preliminary result)   Collection Time: 08/26/23  7:30 PM   Specimen: BLOOD LEFT ARM  Result Value Ref Range Status   Specimen Description   Final    BLOOD LEFT ARM Performed at [facility], [address] [place], [place] [address]    Special Requests   Final    BOTTLES DRAWN AEROBIC AND ANAEROBIC Blood Culture adequate volume Performed at [facility], [address], [place], [place] [address]    Culture  Setup Time   Final    GRAM NEGATIVE RODS IN BOTH AEROBIC AND ANAEROBIC BOTTLES CRITICAL RESULT CALLED TO, READ BACK BY AND VERIFIED WITH: PHARMD DREW [facility] [phone] AT 0939 BY EC    Culture (A)  Final    ESCHERICHIA COLI SUSCEPTIBILITIES TO FOLLOW Performed at [facility], [address], [place], [place] [address]    Report Status PENDING  Incomplete  Blood Culture ID Panel (Reflexed)     Status: Abnormal   Collection Time: 08/26/23  7:30 PM  Result Value Ref Range Status   Enterococcus faecalis NOT DETECTED NOT DETECTED Final   Enterococcus Faecium NOT DETECTED NOT DETECTED Final   Listeria monocytogenes NOT DETECTED NOT DETECTED Final   Staphylococcus species NOT DETECTED NOT DETECTED Final   Staphylococcus aureus (BCID) NOT DETECTED NOT DETECTED Final   Staphylococcus epidermidis NOT DETECTED NOT DETECTED Final   Staphylococcus lugdunensis NOT DETECTED NOT DETECTED Final   Streptococcus species NOT DETECTED NOT DETECTED Final   Streptococcus agalactiae NOT DETECTED NOT DETECTED Final    Streptococcus pneumoniae NOT DETECTED NOT DETECTED Final   Streptococcus pyogenes NOT DETECTED NOT DETECTED Final   A.calcoaceticus-baumannii NOT DETECTED NOT DETECTED Final   Bacteroides fragilis NOT DETECTED NOT DETECTED Final   Enterobacterales DETECTED (A) NOT DETECTED Final    Comment: Enterobacterales represent a large order of gram negative bacteria, not a single organism. CRITICAL RESULT CALLED TO, READ BACK BY AND VERIFIED WITH: PHARMD DREW WOFFORD [id number] AT 0939 BY EC    Enterobacter cloacae complex NOT DETECTED NOT DETECTED Final   Escherichia coli DETECTED (A) NOT DETECTED Final    Comment: CRITICAL RESULT CALLED TO, READ BACK BY AND VERIFIED WITH: PHARMD DREW WOFFORD [phone] AT 0939 BY EC    Klebsiella aerogenes NOT DETECTED NOT DETECTED Final   Klebsiella oxytoca NOT DETECTED NOT DETECTED Final   Klebsiella pneumoniae NOT DETECTED NOT DETECTED Final   Proteus species NOT DETECTED NOT DETECTED Final   Salmonella species NOT DETECTED NOT DETECTED Final   Serratia marcescens NOT DETECTED NOT DETECTED Final   Haemophilus influenzae NOT DETECTED NOT DETECTED Final   Neisseria meningitidis NOT DETECTED NOT DETECTED Final   Pseudomonas aeruginosa NOT DETECTED NOT DETECTED Final   Stenotrophomonas maltophilia NOT DETECTED NOT DETECTED Final   Candida albicans NOT DETECTED NOT DETECTED Final   Candida auris NOT DETECTED NOT DETECTED Final   Candida glabrata NOT DETECTED NOT DETECTED Final   Candida krusei NOT DETECTED NOT DETECTED Final   Candida parapsilosis NOT DETECTED NOT DETECTED Final   Candida tropicalis NOT DETECTED NOT DETECTED Final   Cryptococcus neoformans/gattii NOT DETECTED NOT DETECTED Final   CTX-M ESBL NOT DETECTED NOT DETECTED Final   Carbapenem resistance IMP NOT DETECTED NOT DETECTED Final   Carbapenem resistance KPC NOT DETECTED NOT DETECTED Final   Carbapenem resistance NDM NOT DETECTED NOT DETECTED Final   Carbapenem resist OXA 48 LIKE NOT DETECTED  NOT DETECTED Final   Carbapenem resistance VIM NOT DETECTED NOT DETECTED Final    Comment: Performed at [facility], [address] [place], [place] [address]  Culture, blood (routine x 2)     Status: None (Preliminary result)   Collection Time: 08/26/23  7:35 PM   Specimen: BLOOD RIGHT ARM  Result Value Ref Range Status   Specimen Description   Final    BLOOD RIGHT ARM Performed at [facility], [address] [place], [place] [address]    Special Requests   Final    BOTTLES DRAWN AEROBIC AND ANAEROBIC Blood Culture adequate volume Performed at [facility], [address], [place], [place] [address]    Culture  Setup Time   Final    GRAM NEGATIVE  RODS IN BOTH AEROBIC AND ANAEROBIC BOTTLES CRITICAL VALUE NOTED.  VALUE IS CONSISTENT WITH PREVIOUSLY REPORTED AND CALLED VALUE.    Culture   Final    GRAM NEGATIVE RODS IDENTIFICATION TO FOLLOW Performed at [facility], [address], [place], [place] [address]    Report Status PENDING  Incomplete  MRSA Next Gen by PCR, Nasal     Status: None   Collection Time: 08/27/23  6:50 PM   Specimen: Nasal Mucosa; Nasal Swab  Result Value Ref Range Status   MRSA by PCR Next Gen NOT DETECTED NOT DETECTED Final    Comment: (NOTE) The GeneXpert MRSA Assay (FDA approved for NASAL specimens only), is one component of a comprehensive MRSA colonization surveillance program. It is not intended to diagnose MRSA infection nor to guide or monitor treatment for MRSA infections. Test performance is not FDA approved in patients less than 48 years old. Performed at [facility], [address], [place], [place] [address]       Studies: No results found.   Scheduled Meds:  enoxaparin (LOVENOX) injection  40 mg Subcutaneous Q24H   insulin aspart  0-5 Units Subcutaneous QHS   insulin aspart  0-9 Units Subcutaneous TID WC   metoprolol tartrate  25 mg Oral BID   sodium chloride flush  3 mL  Intravenous Q12H    Continuous Infusions:  piperacillin-tazobactam (ZOSYN)  IV 12.5 mL/hr at 08/28/23 1328     LOS: 2 days     Briant Cedar, MD [facility]  If 7PM-7AM, please contact night-coverage [email or web address] 08/28/2023, 5:00 PM

## 2023-08-28 NOTE — Progress Notes (Signed)
 Progress Note    ASSESSMENT AND PLAN:    Ascending cholangitis s/p ERCP with extension of biliary sphincterotomy, stone extraction by Dr. Marina Goodell 3/1. 2.5 cm ill-defined liver lesion- ?etiology. R/O abscess  Plan: -Continue current diet. -Continue Zosyn -MRI liver with and without contrast today as planned -Trend LFTs -Dr. Meridee Score taking over the service tomorrow      SUBJECTIVE   Feeling great after ERCP No abdominal pain Tolerating heart healthy diet. Denies having any fever or chills No melena or hematochezia    OBJECTIVE:     Vital signs in last 24 hours: Temp:  [97.8 F (36.6 C)-98.5 F (36.9 C)] 98.1 F (36.7 C) (03/02 0820) Pulse Rate:  [64-88] 88 (03/02 0820) Resp:  [12-17] 16 (03/02 0820) BP: (106-138)/(52-74) 127/59 (03/02 0820) SpO2:  [95 %-100 %] 97 % (03/02 0820) Last BM Date : 08/27/23 General:   Alert, well-developed, in NAD EENT:  Normal hearing, non icteric sclera, conjunctive pink.  Heart:  Regular rate and rhythm; no murmur.  No lower extremity edema   Pulm: Normal respiratory effort, lungs CTA bilaterally without wheezes or crackles. Abdomen:  Soft, nondistended, nontender.  Normal bowel sounds,.       Neurologic:  Alert and  oriented x4;  grossly normal neurologically. Psych:  Pleasant, cooperative.  Normal mood and affect.   Intake/Output from previous day: 03/01 0701 - 03/02 0700 In: 1661.8 [P.O.:240; I.V.:1271.8; IV Piggyback:150] Out: 70 [Urine:70] Intake/Output this shift: Total I/O In: 1318.2 [P.O.:480; I.V.:784.4; IV Piggyback:53.8] Out: -   Lab Results: Recent Labs    08/26/23 1626 08/27/23 0551 08/28/23 0359  WBC 10.5 11.8* 9.1  HGB 12.7* 10.5* 10.7*  HCT 40.0 34.2* 34.4*  PLT 255 183 204   BMET Recent Labs    08/26/23 1626 08/27/23 0551 08/28/23 0359  NA 134* 139 137  K 3.7 3.6 4.4  CL 101 106 105  CO2 20* 25 23  GLUCOSE 187* 142* 245*  BUN 11 17 26*  CREATININE 0.65 1.01 1.05  CALCIUM 9.0  8.4* 8.4*   LFT Recent Labs    08/28/23 0359  PROT 5.6*  ALBUMIN 2.7*  AST 97*  ALT 217*  ALKPHOS 335*  BILITOT 2.0*   PT/INR Recent Labs    08/27/23 0945  LABPROT 14.4  INR 1.1   Hepatitis Panel No results for input(s): "HEPBSAG", "HCVAB", "HEPAIGM", "HEPBIGM" in the last 72 hours.  DG ERCP Result Date: 08/27/2023 CLINICAL DATA:  Biliary obstruction EXAM: ERCP TECHNIQUE: Multiple spot images obtained with the fluoroscopic device and submitted for interpretation post-procedure. FLUOROSCOPY: Radiation Exposure Index (as provided by the fluoroscopic device): 93.1 mGy Kerma COMPARISON:  CT abdomen pelvis with contrast 08/26/2023 FINDINGS: Thirteen intraoperative fluoroscopic images were provided for interpretation. The submitted images demonstrate cannulation and opacification of the intra and extrahepatic bile ducts. Balloon sweep of the common bile duct was performed. No significant filling defects identified within the common bile duct on the final images. IMPRESSION: Intraoperative cholangiogram demonstrates balloon sweep of the common bile duct. These images were submitted for radiologic interpretation only. Please see the procedural report for the amount of contrast and the fluoroscopy time utilized. Electronically Signed   By: Acquanetta Belling M.D.   On: 08/27/2023 19:17   CT ABDOMEN PELVIS W CONTRAST Result Date: 08/26/2023 CLINICAL DATA:  One-month history of right upper quadrant pain. Weakness. Elevated liver function tests. EXAM: CT ABDOMEN AND PELVIS WITH CONTRAST TECHNIQUE: Multidetector CT imaging of the abdomen and pelvis was performed using  the standard protocol following bolus administration of intravenous contrast. RADIATION DOSE REDUCTION: This exam was performed according to the departmental dose-optimization program which includes automated exposure control, adjustment of the mA and/or kV according to patient size and/or use of iterative reconstruction technique. CONTRAST:   OMNIPAQUE IOHEXOL 300 MG/ML  SOLN COMPARISON:  Lung cancer screening CT 07/22/2023. No prior abdominal CTs available. A report of an abdominopelvic CT of 07/26/2001 is reviewed. FINDINGS: Lower chest: Clear lung bases. Mitral and aortic valve calcification. Coronary artery calcification. Hepatobiliary: Posterior right hepatic lobe hypoattenuating 2.5 x 2.3 cm lesion on 20/2. Cholecystectomy. Hypoattenuation in the operative bed including up to 2.1 cm in 25/2 could represent a combination of focal steatosis and possibly a gallbladder or cystic duct remnant. No surrounding inflammation. Since 07/22/2023, development of moderate intra and extrahepatic biliary duct dilatation. Common duct dilatation is followed to the level of a 1.7 cm soft tissue density within the distal common duct including on coronal image 67. Pancreas: Normal, without mass or ductal dilatation. Spleen: Normal in size, without focal abnormality. Adrenals/Urinary Tract: Mild right adrenal thickening. Interpolar right renal 3.3 cm cyst. Bilateral too small to characterize renal lesions are most likely cysts . In the absence of clinically indicated signs/symptoms require(s) no independent follow-up. No hydronephrosis. Apparent mild bladder wall thickening is favored to be due to underdistention. Stomach/Bowel: Normal stomach, without wall thickening. Extensive colonic diverticulosis. Normal terminal ileum and appendix. Normal small bowel. Vascular/Lymphatic: Advanced aortic and branch vessel atherosclerosis. No abdominopelvic adenopathy. Reproductive: Mild prostatomegaly. Other: No significant free fluid. Small bilateral fat containing inguinal hernias. Musculoskeletal: No acute osseous abnormality. IMPRESSION: 1. Interval development, since 07/22/2023, of moderate biliary duct dilatation secondary to soft tissue density within the distal common duct. Most likely noncalcified choledocholithiasis. A soft tissue neoplasm such as extrahepatic  cholangiocarcinoma could look similar but is felt less likely. Recommend further evaluation with ERCP. 2. Right hepatic lobe hypoattenuating lesion is indeterminate. Correlate with any history of primary malignancy. Consider further evaluation with outpatient, nonemergent pre and post contrast abdominal MRI. 3. Cholecystectomy. Hypoattenuation in the region of the operative bed could represent focal hepatic steatosis and/or a gallbladder/cystic duct remnant. No surrounding inflammation to suggest acute clinical significance. 4. Incidental findings, including: Coronary artery atherosclerosis. Aortic Atherosclerosis (ICD10-I70.0). Mitral and aortic valvular calcifications for which echocardiography should be considered. Prostatomegaly. Electronically Signed   By: Jeronimo Greaves M.D.   On: 08/26/2023 21:04     Principal Problem:   Biliary obstruction Active Problems:   Essential hypertension   Mixed hyperlipidemia   CAD (coronary artery disease)   RUQ pain   Cholangitis   Type 2 diabetes mellitus (HCC)   Transaminitis   Abnormal CT of the abdomen   Choledocholithiasis     LOS: 2 days     Edman Circle, MD 08/28/2023, 2:22 PM [facility] [phone]

## 2023-08-29 ENCOUNTER — Encounter (HOSPITAL_COMMUNITY): Payer: Self-pay | Admitting: Internal Medicine

## 2023-08-29 ENCOUNTER — Other Ambulatory Visit: Payer: Medicare Other

## 2023-08-29 DIAGNOSIS — K769 Liver disease, unspecified: Secondary | ICD-10-CM | POA: Diagnosis not present

## 2023-08-29 DIAGNOSIS — R978 Other abnormal tumor markers: Secondary | ICD-10-CM

## 2023-08-29 DIAGNOSIS — R7989 Other specified abnormal findings of blood chemistry: Secondary | ICD-10-CM

## 2023-08-29 DIAGNOSIS — K8309 Other cholangitis: Secondary | ICD-10-CM | POA: Diagnosis not present

## 2023-08-29 DIAGNOSIS — K831 Obstruction of bile duct: Secondary | ICD-10-CM | POA: Diagnosis not present

## 2023-08-29 DIAGNOSIS — R932 Abnormal findings on diagnostic imaging of liver and biliary tract: Secondary | ICD-10-CM

## 2023-08-29 LAB — CBC WITH DIFFERENTIAL/PLATELET
Abs Immature Granulocytes: 0.03 10*3/uL (ref 0.00–0.07)
Basophils Absolute: 0 10*3/uL (ref 0.0–0.1)
Basophils Relative: 0 %
Eosinophils Absolute: 0 10*3/uL (ref 0.0–0.5)
Eosinophils Relative: 1 %
HCT: 31.7 % — ABNORMAL LOW (ref 39.0–52.0)
Hemoglobin: 9.9 g/dL — ABNORMAL LOW (ref 13.0–17.0)
Immature Granulocytes: 0 %
Lymphocytes Relative: 37 %
Lymphs Abs: 2.6 10*3/uL (ref 0.7–4.0)
MCH: 29.7 pg (ref 26.0–34.0)
MCHC: 31.2 g/dL (ref 30.0–36.0)
MCV: 95.2 fL (ref 80.0–100.0)
Monocytes Absolute: 0.6 10*3/uL (ref 0.1–1.0)
Monocytes Relative: 9 %
Neutro Abs: 3.9 10*3/uL (ref 1.7–7.7)
Neutrophils Relative %: 53 %
Platelets: 207 10*3/uL (ref 150–400)
RBC: 3.33 MIL/uL — ABNORMAL LOW (ref 4.22–5.81)
RDW: 16.3 % — ABNORMAL HIGH (ref 11.5–15.5)
WBC: 7.2 10*3/uL (ref 4.0–10.5)
nRBC: 0 % (ref 0.0–0.2)

## 2023-08-29 LAB — COMPREHENSIVE METABOLIC PANEL
ALT: 138 U/L — ABNORMAL HIGH (ref 0–44)
AST: 35 U/L (ref 15–41)
Albumin: 2.7 g/dL — ABNORMAL LOW (ref 3.5–5.0)
Alkaline Phosphatase: 252 U/L — ABNORMAL HIGH (ref 38–126)
Anion gap: 8 (ref 5–15)
BUN: 21 mg/dL (ref 8–23)
CO2: 26 mmol/L (ref 22–32)
Calcium: 8.4 mg/dL — ABNORMAL LOW (ref 8.9–10.3)
Chloride: 104 mmol/L (ref 98–111)
Creatinine, Ser: 1.03 mg/dL (ref 0.61–1.24)
GFR, Estimated: >60 mL/min (ref >60)
Glucose, Bld: 225 mg/dL — ABNORMAL HIGH (ref 70–99)
Potassium: 4.2 mmol/L (ref 3.5–5.1)
Sodium: 138 mmol/L (ref 135–145)
Total Bilirubin: 1.4 mg/dL — ABNORMAL HIGH (ref 0.0–1.2)
Total Protein: 5.5 g/dL — ABNORMAL LOW (ref 6.5–8.1)

## 2023-08-29 LAB — CULTURE, BLOOD (ROUTINE X 2)
Special Requests: ADEQUATE
Special Requests: ADEQUATE

## 2023-08-29 LAB — GLUCOSE, CAPILLARY
Glucose-Capillary: 166 mg/dL — ABNORMAL HIGH (ref 70–99)
Glucose-Capillary: 277 mg/dL — ABNORMAL HIGH (ref 70–99)
Glucose-Capillary: 102 mg/dL — ABNORMAL HIGH (ref 70–99)
Glucose-Capillary: 141 mg/dL — ABNORMAL HIGH (ref 70–99)

## 2023-08-29 MED ORDER — VALACYCLOVIR HCL 500 MG PO TABS
1000.0000 mg | ORAL_TABLET | Freq: Every day | ORAL | Status: DC
  Administered 2023-08-29 – 2023-08-30 (×2): 1000 mg via ORAL
  Filled 2023-08-29 (×2): qty 2

## 2023-08-29 MED ORDER — INSULIN GLARGINE-YFGN 100 UNIT/ML ~~LOC~~ SOLN
5.0000 [IU] | Freq: Every day | SUBCUTANEOUS | Status: DC
  Administered 2023-08-29: 5 [IU] via SUBCUTANEOUS
  Filled 2023-08-29 (×2): qty 0.05

## 2023-08-29 MED ORDER — SODIUM CHLORIDE 0.9 % IV SOLN
3.0000 g | Freq: Four times a day (QID) | INTRAVENOUS | Status: DC
  Administered 2023-08-29 – 2023-08-30 (×5): 3 g via INTRAVENOUS
  Filled 2023-08-29 (×5): qty 8

## 2023-08-29 MED ORDER — MIRABEGRON ER 25 MG PO TB24
50.0000 mg | ORAL_TABLET | Freq: Every day | ORAL | Status: DC
  Administered 2023-08-29 – 2023-08-30 (×2): 50 mg via ORAL
  Filled 2023-08-29 (×2): qty 2

## 2023-08-29 MED ORDER — ENOXAPARIN SODIUM 40 MG/0.4ML IJ SOSY
40.0000 mg | PREFILLED_SYRINGE | INTRAMUSCULAR | Status: DC
Start: 2023-08-30 — End: 2023-08-30

## 2023-08-29 NOTE — Progress Notes (Addendum)
 PROGRESS NOTE  Troy Boyer WUJ:[id number] DOB: May 30, 1951 DOA: 08/26/2023 PCP: Sharlene Dory, DO  HPI/Recap of past 24 hours: Troy Boyer is a 73 y.o. male with medical history significant for CAD s/p prior stenting, T2DM, HTN, HLD, s/p cholecystectomy, hx of choledocholithiasis who presented to the ED for evaluation of worsening RUQ abdominal pain X 2 weeks with associated nausea/vomiting, drenching night sweats, with weight loss. Pt was seen by his PCP PTA and was found to have newly elevated LFTs.  He was referred to GI and was seen in office by Dr. Adela Lank on 2/26.  In the ED, patient noted to be febrile, heart rate 90, otherwise unremarkable.  Labs showed AST 476, ALT 489, alk phos 550, total bilirubin 5.8, lipase 47, sodium 134, WBC 10.5. UA negative for UTI. SARS-CoV-2, influenza, RSV PCR negative. CT a/p with interval development, since 07/22/2023, of moderate biliary duct dilatation secondary to soft tissue density within the distal common duct. Most likely noncalcified choledocholithiasis. A soft tissue neoplasm such as extrahepatic cholangiocarcinoma could look similar but is felt less likely. Recommend further evaluation with ERCP. GI consulted.  Patient admitted for further management.    Today, patient reports feeling better, denies any nausea/vomiting, abdominal pain.  Able to tolerate diet.  Noted cold sore which is present in his nose.  Reports he gets it in his nose and sometimes spread over his oral cavity.  For liver biopsy on 3//25    Assessment/Plan: Principal Problem:   Biliary obstruction Active Problems:   Essential hypertension   Mixed hyperlipidemia   CAD (coronary artery disease)   RUQ pain   Cholangitis   Type 2 diabetes mellitus (HCC)   Transaminitis   Abnormal CT of the abdomen   Choledocholithiasis   Ascending cholangitis Likely 2/2 recurrent CBD stones/choledocholithiasis Presented with Tmax of 105, currently afebrile, with resolved  leukocytosis CT shows moderate biliary duct dilatation secondary to soft tissue density within distal common duct.  Suspected due to choledocholithiasis although soft tissue neoplasm possible GI consulted, status post ERCP on 08/27/2023 showed choledocholithiasis status post biliary sphincterotomy, balloon sphincteroplasty and CBD stone extraction, noted cholangitis, indeterminate liver lesion possibly abscess, rule out  Ca 19-9-->518 GI recommended to continue antibiotics, trend labs, MRI of the liver MRI liver showed lesion in the posterior right lobe measuring 2.8 x 2.2, highly suspicious for malignancy or metastasis. IR consulted for liver biopsy on 08/30/23 Trend LFTs IV Zosyn---> de-escalate to IV Unasyn Monitor closely  Possible E. coli bacteremia Likely source from above BC x 2, growing pansensitive E. Coli IV Zosyn---> de-escalate to IV Unasyn  Elevated LFTs Likely 2/2 possible cholangitis Avoid hepatotoxic medications Daily CMP  Cold sores/herpes labialis Noted cold sore located in L nasal cavity Reports he gets it in his nose and sometimes spread over his oral cavity Usually takes Valtrex 1 g daily as per patient, start Valtrex  Coronary artery disease: Continue Lopressor, holding statin   Diabetes mellitus type 2, with hyperglycemia Last A1c 7.6 on 07/2023 SSI, Accu-Cheks, hypoglycemic protocol Holding home metformin and Actos   Hypertension Hold Lopressor, amlodipine pending bp   Hyperlipidemia Holding statin given transaminitis.    Estimated body mass index is 29.18 kg/m as calculated from the following:   Height as of this encounter: 5\' 4"  (1.626 m).   Weight as of this encounter: 77.1 kg.     Code Status: Full  Family Communication: None at bedside  Disposition Plan: Status is: Inpatient Remains inpatient appropriate because: Level of care  Consultants: GI  Procedures: ERCP  Antimicrobials: Unasyn  DVT  prophylaxis: Lovenox   Objective: Vitals:   08/28/23 2134 08/29/23 0441 08/29/23 0950 08/29/23 1414  BP: 134/67 99/86 136/68 (!) 141/71  Pulse: 68 65 64 65  Resp: 18 18 16 16   Temp: 98.8 F (37.1 C) 98.6 F (37 C) 98.4 F (36.9 C) 98.1 F (36.7 C)  TempSrc: Oral Oral Oral Oral  SpO2: 98% 97% 98% 99%  Weight:      Height:        Intake/Output Summary (Last 24 hours) at 08/29/2023 1729 Last data filed at 08/29/2023 1621 Gross per 24 hour  Intake 173.18 ml  Output --  Net 173.18 ml   Filed Weights   08/26/23 1553  Weight: 77.1 kg    Exam: General: NAD  Cardiovascular: S1, S2 present Respiratory: CTAB Abdomen: Soft, nontender, nondistended, bowel sounds present Musculoskeletal: No bilateral pedal edema noted Skin: Normal Psychiatry: Normal mood    Data Reviewed: CBC: Recent Labs  Lab 08/26/23 1626 08/27/23 0551 08/28/23 0359 08/29/23 0356  WBC 10.5 11.8* 9.1 7.2  NEUTROABS  --   --  7.0 3.9  HGB 12.7* 10.5* 10.7* 9.9*  HCT 40.0 34.2* 34.4* 31.7*  MCV 91.7 95.3 94.8 95.2  PLT 255 183 204 207   Basic Metabolic Panel: Recent Labs  Lab 08/26/23 1626 08/27/23 0551 08/28/23 0359 08/29/23 0356  NA 134* 139 137 138  K 3.7 3.6 4.4 4.2  CL 101 106 105 104  CO2 20* 25 23 26   GLUCOSE 187* 142* 245* 225*  BUN 11 17 26* 21  CREATININE 0.65 1.01 1.05 1.03  CALCIUM 9.0 8.4* 8.4* 8.4*   GFR: Estimated Creatinine Clearance: 61.8 mL/min (by C-G formula based on SCr of 1.03 mg/dL). Liver Function Tests: Recent Labs  Lab 08/24/23 1217 08/26/23 1626 08/27/23 0551 08/28/23 0359 08/29/23 0356  AST 276* 476* 252* 97* 35  ALT 321* 489* 317* 217* 138*  ALKPHOS 490* 550* 390* 335* 252*  BILITOT 0.8 5.8* 5.6* 2.0* 1.4*  PROT 7.3 7.3 5.8* 5.6* 5.5*  ALBUMIN 4.1 3.6 2.8* 2.7* 2.7*   Recent Labs  Lab 08/26/23 1626  LIPASE 47   Recent Labs  Lab 08/26/23 1626  AMMONIA 72*   Coagulation Profile: Recent Labs  Lab 08/27/23 0945  INR 1.1   Cardiac  Enzymes: No results for input(s): "CKTOTAL", "CKMB", "CKMBINDEX", "TROPONINI" in the last 168 hours. BNP (last 3 results) No results for input(s): "PROBNP" in the last 8760 hours. HbA1C: No results for input(s): "HGBA1C" in the last 72 hours. CBG: Recent Labs  Lab 08/28/23 1612 08/28/23 2132 08/29/23 0745 08/29/23 1139 08/29/23 1706  GLUCAP 256* 191* 166* 277* 102*   Lipid Profile: No results for input(s): "CHOL", "HDL", "LDLCALC", "TRIG", "CHOLHDL", "LDLDIRECT" in the last 72 hours. Thyroid Function Tests: No results for input(s): "TSH", "T4TOTAL", "FREET4", "T3FREE", "THYROIDAB" in the last 72 hours. Anemia Panel: No results for input(s): "VITAMINB12", "FOLATE", "FERRITIN", "TIBC", "IRON", "RETICCTPCT" in the last 72 hours. Urine analysis:    Component Value Date/Time   COLORURINE AMBER (A) 08/26/2023 1800   APPEARANCEUR CLEAR 08/26/2023 1800   LABSPEC 1.017 08/26/2023 1800   PHURINE 6.0 08/26/2023 1800   GLUCOSEU 50 (A) 08/26/2023 1800   GLUCOSEU 500 (A) 03/15/2019 1516   HGBUR NEGATIVE 08/26/2023 1800   BILIRUBINUR SMALL (A) 08/26/2023 1800   BILIRUBINUR neg 12/22/2021 1146   KETONESUR NEGATIVE 08/26/2023 1800   PROTEINUR NEGATIVE 08/26/2023 1800   UROBILINOGEN 0.2 12/22/2021 1146  UROBILINOGEN 0.2 03/15/2019 1516   NITRITE NEGATIVE 08/26/2023 1800   LEUKOCYTESUR NEGATIVE 08/26/2023 1800   Sepsis Labs: @LABRCNTIP (procalcitonin:4,lacticidven:4)  ) Recent Results (from the past 240 hours)  Resp panel by RT-PCR (RSV, Flu A&B, Covid) Anterior Nasal Swab     Status: None   Collection Time: 08/26/23  4:24 PM   Specimen: Anterior Nasal Swab  Result Value Ref Range Status   SARS Coronavirus 2 by RT PCR NEGATIVE NEGATIVE Final    Comment: (NOTE) SARS-CoV-2 target nucleic acids are NOT DETECTED.  The SARS-CoV-2 RNA is generally detectable in upper respiratory specimens during the acute phase of infection. The lowest concentration of SARS-CoV-2 viral copies this  assay can detect is 138 copies/mL. A negative result does not preclude SARS-Cov-2 infection and should not be used as the sole basis for treatment or other patient management decisions. A negative result may occur with  improper specimen collection/handling, submission of specimen other than nasopharyngeal swab, presence of viral mutation(s) within the areas targeted by this assay, and inadequate number of viral copies(<138 copies/mL). A negative result must be combined with clinical observations, patient history, and epidemiological information. The expected result is Negative.  Fact Sheet for Patients:  BloggerCourse.com  Fact Sheet for Healthcare Providers:  SeriousBroker.it  This test is no t yet approved or cleared by the [place] FDA and  has been authorized for detection and/or diagnosis of SARS-CoV-2 by FDA under an Emergency Use Authorization (EUA). This EUA will remain  in effect (meaning this test can be used) for the duration of the COVID-19 declaration under Section 564(b)(1) of the Act, 21 U.S.C.section 360bbb-3(b)(1), unless the authorization is terminated  or revoked sooner.       Influenza A by PCR NEGATIVE NEGATIVE Final   Influenza B by PCR NEGATIVE NEGATIVE Final    Comment: (NOTE) The Xpert Xpress SARS-CoV-2/FLU/RSV plus assay is intended as an aid in the diagnosis of influenza from Nasopharyngeal swab specimens and should not be used as a sole basis for treatment. Nasal washings and aspirates are unacceptable for Xpert Xpress SARS-CoV-2/FLU/RSV testing.  Fact Sheet for Patients: BloggerCourse.com  Fact Sheet for Healthcare Providers: SeriousBroker.it  This test is not yet approved or cleared by the [place] FDA and has been authorized for detection and/or diagnosis of SARS-CoV-2 by FDA under an Emergency Use Authorization (EUA). This EUA will  remain in effect (meaning this test can be used) for the duration of the COVID-19 declaration under Section 564(b)(1) of the Act, 21 U.S.C. section 360bbb-3(b)(1), unless the authorization is terminated or revoked.     Resp Syncytial Virus by PCR NEGATIVE NEGATIVE Final    Comment: (NOTE) Fact Sheet for Patients: BloggerCourse.com  Fact Sheet for Healthcare Providers: SeriousBroker.it  This test is not yet approved or cleared by the [place] FDA and has been authorized for detection and/or diagnosis of SARS-CoV-2 by FDA under an Emergency Use Authorization (EUA). This EUA will remain in effect (meaning this test can be used) for the duration of the COVID-19 declaration under Section 564(b)(1) of the Act, 21 U.S.C. section 360bbb-3(b)(1), unless the authorization is terminated or revoked.  Performed at [facility], [address] [place], [place] [address]   Culture, blood (routine x 2)     Status: Abnormal   Collection Time: 08/26/23  7:30 PM   Specimen: BLOOD LEFT ARM  Result Value Ref Range Status   Specimen Description   Final    BLOOD LEFT ARM Performed at [facility], [address], [place], [place] [address]    Special Requests   Final    BOTTLES DRAWN AEROBIC AND ANAEROBIC Blood Culture adequate volume Performed at [facility], [address], [place], [place] [address]    Culture  Setup Time   Final    GRAM NEGATIVE RODS IN BOTH AEROBIC AND ANAEROBIC BOTTLES CRITICAL RESULT CALLED TO, READ BACK BY AND VERIFIED WITH: Nathanial Millman [facility] [id number] AT 7829 BY EC Performed at [facility], [address], [place], [place] [address]    Culture ESCHERICHIA COLI (A)  Final   Report Status 08/29/2023 FINAL  Final   Organism ID, Bacteria ESCHERICHIA COLI  Final   Organism ID, Bacteria ESCHERICHIA COLI  Final      Susceptibility   Escherichia coli - KIRBY BAUER*     CEFAZOLIN SENSITIVE Sensitive    Escherichia coli - MIC*    AMPICILLIN 8 SENSITIVE Sensitive     CEFEPIME <=0.12 SENSITIVE Sensitive     CEFTAZIDIME <=1 SENSITIVE Sensitive     CEFTRIAXONE <=0.25 SENSITIVE Sensitive     CIPROFLOXACIN <=0.25 SENSITIVE Sensitive     GENTAMICIN <=1 SENSITIVE Sensitive     IMIPENEM <=0.25 SENSITIVE Sensitive     TRIMETH/SULFA <=20 SENSITIVE Sensitive     AMPICILLIN/SULBACTAM 4 SENSITIVE Sensitive     PIP/TAZO <=4 SENSITIVE Sensitive ug/mL    * ESCHERICHIA COLI    ESCHERICHIA COLI  Blood Culture ID Panel (Reflexed)     Status: Abnormal   Collection Time: 08/26/23  7:30 PM  Result Value Ref Range Status   Enterococcus faecalis NOT DETECTED NOT DETECTED Final   Enterococcus Faecium NOT DETECTED NOT DETECTED Final   Listeria monocytogenes NOT DETECTED NOT DETECTED Final   Staphylococcus species NOT DETECTED NOT DETECTED Final   Staphylococcus aureus (BCID) NOT DETECTED NOT DETECTED Final   Staphylococcus epidermidis NOT DETECTED NOT DETECTED Final   Staphylococcus lugdunensis NOT DETECTED NOT DETECTED Final   Streptococcus species NOT DETECTED NOT DETECTED Final   Streptococcus agalactiae NOT DETECTED NOT DETECTED Final   Streptococcus pneumoniae NOT DETECTED NOT DETECTED Final   Streptococcus pyogenes NOT DETECTED NOT DETECTED Final   A.calcoaceticus-baumannii NOT DETECTED NOT DETECTED Final   Bacteroides fragilis NOT DETECTED NOT DETECTED Final   Enterobacterales DETECTED (A) NOT DETECTED Final    Comment: Enterobacterales represent a large order of gram negative bacteria, not a single organism. CRITICAL RESULT CALLED TO, READ BACK BY AND VERIFIED WITH: PHARMD DREW WOFFORD [id number] AT 0939 BY EC    Enterobacter cloacae complex NOT DETECTED NOT DETECTED Final   Escherichia coli DETECTED (A) NOT DETECTED Final    Comment: CRITICAL RESULT CALLED TO, READ BACK BY AND VERIFIED WITH: PHARMD DREW WOFFORD [id number] AT 0939 BY EC    Klebsiella aerogenes  NOT DETECTED NOT DETECTED Final   Klebsiella oxytoca NOT DETECTED NOT DETECTED Final   Klebsiella pneumoniae NOT DETECTED NOT DETECTED Final   Proteus species NOT DETECTED NOT DETECTED Final   Salmonella species NOT DETECTED NOT DETECTED Final   Serratia marcescens NOT DETECTED NOT DETECTED Final   Haemophilus influenzae NOT DETECTED NOT DETECTED Final   Neisseria meningitidis NOT DETECTED NOT DETECTED Final   Pseudomonas aeruginosa NOT DETECTED NOT DETECTED Final   Stenotrophomonas maltophilia NOT DETECTED NOT DETECTED Final   Candida albicans NOT DETECTED NOT DETECTED Final   Candida auris NOT DETECTED NOT DETECTED Final   Candida glabrata NOT DETECTED NOT DETECTED Final   Candida krusei NOT  DETECTED NOT DETECTED Final   Candida parapsilosis NOT DETECTED NOT DETECTED Final   Candida tropicalis NOT DETECTED NOT DETECTED Final   Cryptococcus neoformans/gattii NOT DETECTED NOT DETECTED Final   CTX-M ESBL NOT DETECTED NOT DETECTED Final   Carbapenem resistance IMP NOT DETECTED NOT DETECTED Final   Carbapenem resistance KPC NOT DETECTED NOT DETECTED Final   Carbapenem resistance NDM NOT DETECTED NOT DETECTED Final   Carbapenem resist OXA 48 LIKE NOT DETECTED NOT DETECTED Final   Carbapenem resistance VIM NOT DETECTED NOT DETECTED Final    Comment: Performed at [facility], [address], [place], [place] [address]  Culture, blood (routine x 2)     Status: Abnormal   Collection Time: 08/26/23  7:35 PM   Specimen: BLOOD RIGHT ARM  Result Value Ref Range Status   Specimen Description   Final    BLOOD RIGHT ARM Performed at [facility], [address] [place], [place] [address]    Special Requests   Final    BOTTLES DRAWN AEROBIC AND ANAEROBIC Blood Culture adequate volume Performed at [facility], [address], [place], [place] [address]    Culture  Setup Time   Final    GRAM NEGATIVE RODS IN BOTH AEROBIC AND ANAEROBIC BOTTLES CRITICAL VALUE  NOTED.  VALUE IS CONSISTENT WITH PREVIOUSLY REPORTED AND CALLED VALUE.    Culture (A)  Final    ESCHERICHIA COLI SUSCEPTIBILITIES PERFORMED ON PREVIOUS CULTURE WITHIN THE LAST 5 DAYS. Performed at [facility], [address] [place], [place] [address]    Report Status 08/29/2023 FINAL  Final  MRSA Next Gen by PCR, Nasal     Status: None   Collection Time: 08/27/23  6:50 PM   Specimen: Nasal Mucosa; Nasal Swab  Result Value Ref Range Status   MRSA by PCR Next Gen NOT DETECTED NOT DETECTED Final    Comment: (NOTE) The GeneXpert MRSA Assay (FDA approved for NASAL specimens only), is one component of a comprehensive MRSA colonization surveillance program. It is not intended to diagnose MRSA infection nor to guide or monitor treatment for MRSA infections. Test performance is not FDA approved in patients less than 36 years old. Performed at [facility], [address], [place], [place] [address]       Studies: MR LIVER W WO CONTRAST Result Date: 08/28/2023 CLINICAL DATA:  Characterize liver lesion identified by prior CT EXAM: MRI ABDOMEN WITHOUT AND WITH CONTRAST TECHNIQUE: Multiplanar multisequence MR imaging of the abdomen was performed both before and after the administration of intravenous contrast. CONTRAST:  8mL GADAVIST GADOBUTROL 1 MMOL/ML IV SOLN COMPARISON:  CT abdomen pelvis, 08/26/2023 FINDINGS: Lower chest: No acute abnormality. Hepatobiliary: Intrinsically T1 hypointense, heterogeneously T2 hyperintense lesion in the posterior right lobe of the liver measuring 2.8 x 2.2 cm somewhat serpiginous internal structure and ill-defined margins (series 4, image 16). Progressive internal contrast enhancement on multiphasic sequences without a characteristically benign enhancement pattern. Status post cholecystectomy. Unchanged biliary ductal dilatation, with postprocedural pneumobilia but without residual calculus. Common bile duct measures up to 1.0 cm in caliber  (series 3, image 19). Status post cholecystectomy with a probable small gallbladder remnant and surgical material in the gallbladder fossa. Pancreas: Unremarkable. No pancreatic ductal dilatation or surrounding inflammatory changes. Spleen: Normal in size without significant abnormality. Adrenals/Urinary Tract: Adrenal glands are unremarkable. Kidneys are normal, without renal calculi, solid lesion, or hydronephrosis. Stomach/Bowel: Stomach is within normal limits. Periampullary duodenal diverticulum. No evidence  of bowel wall thickening, distention, or inflammatory changes. Vascular/Lymphatic: Aortic atherosclerosis. No enlarged abdominal lymph nodes. Other: No abdominal wall hernia or abnormality. No ascites. Musculoskeletal: No acute or significant osseous findings. IMPRESSION: 1. Lesion in the posterior right lobe of the liver measuring 2.8 x 2.2 cm with unusual appearance and progressive internal contrast enhancement on multiphasic sequences without a characteristically benign enhancement pattern. Given lack of clearly benign features, this is highly suspicious for malignancy or metastasis. Consider PET-CT for metabolic characterization and tissue sampling. 2. No other suspicious liver lesions. No other evidence of malignancy or metastatic disease in the abdomen. 3. Status post cholecystectomy. Unchanged biliary ductal dilatation, with postprocedural pneumobilia but without residual calculus. Common bile duct measures up to 1.0 cm in caliber. Aortic Atherosclerosis (ICD10-I70.0). Electronically Signed   By: Jearld Lesch M.D.   On: 08/28/2023 21:25     Scheduled Meds:  [START ON 08/30/2023] enoxaparin (LOVENOX) injection  40 mg Subcutaneous Q24H   insulin aspart  0-15 Units Subcutaneous TID WC   insulin aspart  0-5 Units Subcutaneous QHS   insulin glargine-yfgn  5 Units Subcutaneous Q2200   mirabegron ER  50 mg Oral Daily   sodium chloride flush  3 mL Intravenous Q12H    Continuous Infusions:   ampicillin-sulbactam (UNASYN) IV Stopped (08/29/23 1502)     LOS: 3 days     Briant Cedar, MD [facility]  If 7PM-7AM, please contact night-coverage [email or web address] 08/29/2023, 5:29 PM

## 2023-08-29 NOTE — Consult Note (Signed)
 Chief Complaint: Liver lesion incidental finding on imaging/ post ERCP, referred for image guided liver lesion biopsy to rule out malignancy  Referring Provider(s): Boone Master, PA-C  Supervising Physician: Marliss Coots  Patient Status: [facility] - In-pt  History of Present Illness: Troy Boyer is a 73 y.o. male with medical history significant for CAD s/p prior stenting, T2DM, HTN, HLD, s/p cholecystectomy, hx of choledocholithiasis who presented to the ED for evaluation of worsening RUQ abdominal pain X 2 weeks with associated nausea/vomiting, drenching night sweats, with weight loss. Prior to visit, elevated LFTs noted at PCP office which prompted GI referral. Pt was admitted for ascending cholangitis and received ERCP with extension of biliary sphincterotomy, stone extraction by Dr. Marina Goodell 3/1. Imaging also revealed incidental finding of a 2.8 x 2.2 cm liver lesion suspicious for malignancy, prompting IR referral for biopsy.   Patient is Full Code  Past Medical History:  Diagnosis Date   Adenomatous colon polyp    tubular   CAD (coronary artery disease)    a. Cath 06/08/01 at [facility] with normal LM and LAD, 95% LCx s/p Circumflex stent 4.0 x 15 mm Penta and 85% and 80% prox RCA s/p 3.5 x 23 mm Penta b. cath 01/20/2015 95% prox LCx ISR treated with DES, 55% prox to mid RCA, 45% distal RCA, 40% midLAD, EF 55%     Cataract    Diabetes mellitus without complication (HCC)    Diarrhea    Diverticulosis    Erectile dysfunction    Gallstones    Hyperlipidemia    Hypertension    Myocardial infarction [facility])     Past Surgical History:  Procedure Laterality Date   ANGIOPLASTY  2002   2 stents   BALLOON DILATION N/A 08/27/2023   Procedure: BALLOON DILATION;  Surgeon: Hilarie Fredrickson, MD;  Location: [facility] ENDOSCOPY;  Service: Gastroenterology;  Laterality: N/A;   CARDIAC CATHETERIZATION N/A 01/20/2015   Procedure: Left Heart Cath and Coronary Angiography;  Surgeon: Lyn Records, MD;   Location: [facility] INVASIVE CV LAB;  Service: Cardiovascular;  Laterality: N/A;   CARDIAC CATHETERIZATION N/A 01/27/2016   Procedure: Left Heart Cath and Coronary Angiography;  Surgeon: Tonny Bollman, MD;  Location: [facility] INVASIVE CV LAB;  Service: Cardiovascular;  Laterality: N/A;   CHOLECYSTECTOMY N/A 02/09/2016   Procedure: LAPAROSCOPIC CHOLECYSTECTOMY WITH INTRAOPERATIVE CHOLANGIOGRAM;  Surgeon: Harriette Bouillon, MD;  Location: [facility] OR;  Service: General;  Laterality: N/A;   COLONOSCOPY W/ POLYPECTOMY  08/2013   Avg risk screening, Dr Walker Kehr. 5 mm tubular adenoma ascending.  Mild to moderated descending and sigmoid tics.  Internal hemorrhoids   CORONARY ANGIOPLASTY WITH STENT PLACEMENT     ERCP N/A 02/13/2016   Procedure: ENDOSCOPIC RETROGRADE CHOLANGIOPANCREATOGRAPHY (ERCP);  Surgeon: Sherrilyn Rist, MD;  Location: [facility] ENDOSCOPY;  Service: Gastroenterology;  Laterality: N/A;   ERCP N/A 08/27/2023   Procedure: ENDOSCOPIC RETROGRADE CHOLANGIOPANCREATOGRAPHY (ERCP);  Surgeon: Hilarie Fredrickson, MD;  Location: [facility];  Service: Gastroenterology;  Laterality: N/A;   REMOVAL OF STONES  08/27/2023   Procedure: REMOVAL OF STONES;  Surgeon: Hilarie Fredrickson, MD;  Location: [facility]OSCOPY;  Service: Gastroenterology;;   ROTATOR CUFF REPAIR Right 2009   ROTATOR CUFF REPAIR Left 2014   SPHINCTEROTOMY  08/27/2023   Procedure: SPHINCTEROTOMY;  Surgeon: Hilarie Fredrickson, MD;  Location: [facility] ENDOSCOPY;  Service: Gastroenterology;;   TRIGGER FINGER RELEASE Bilateral 2013   4 fingers, middle finger on both hands    Allergies: Protonix [pantoprazole]  Medications:  Prior to Admission medications   Medication Sig Start Date End Date Taking? Authorizing Provider  amLODipine (NORVASC) 5 MG tablet TAKE 1 TABLET BY MOUTH DAILY 12/31/22  Yes Sharlene Dory, DO  ezetimibe (ZETIA) 10 MG tablet Take 1 tablet by mouth once daily 08/09/23  Yes Crenshaw, Madolyn Frieze, MD  fenofibrate (TRICOR) 48 MG tablet TAKE 1 TABLET BY MOUTH DAILY  11/18/22  Yes Sharlene Dory, DO  glucose blood (ACCU-CHEK GUIDE) test strip Use daily to check blood sugar.  DX E11.65 04/25/23  Yes Wendling, Jilda Roche, DO  Krill Oil 500 MG CAPS Take 500 mg by mouth 2 (two) times daily.   Yes [provider]  metFORMIN (GLUCOPHAGE-XR) 500 MG 24 hr tablet Take 2 tablets (1,000 mg total) by mouth daily with breakfast. 05/11/23  Yes Wendling, Jilda Roche, DO  metoprolol tartrate (LOPRESSOR) 25 MG tablet Take 1 tablet (25 mg total) by mouth 2 (two) times daily. Patient taking differently: Take 25 mg by mouth every evening. 11/24/22  Yes Azalee Course, PA  Multiple Vitamin (MULTIVITAMIN WITH MINERALS) TABS tablet Take 1 tablet by mouth daily.   Yes [provider]  MYRBETRIQ 50 MG TB24 tablet Take 50 mg by mouth daily. 04/10/23  Yes [provider]  nitroGLYCERIN (NITROSTAT) 0.4 MG SL tablet Place 1 tablet (0.4 mg total) under the tongue every 5 (five) minutes as needed for chest pain. 04/26/22  Yes Sharlene Dory, DO  omeprazole (PRILOSEC) 40 MG capsule Take 1 capsule (40 mg total) by mouth daily before breakfast. 08/24/23  Yes Armbruster, Willaim Rayas, MD  ondansetron (ZOFRAN-ODT) 4 MG disintegrating tablet Take 4 mg by mouth every 8 (eight) hours as needed for nausea or vomiting.   Yes [provider]  pioglitazone (ACTOS) 45 MG tablet Take 1 tablet (45 mg total) by mouth daily. 05/11/23  Yes Sharlene Dory, DO  ramipril (ALTACE) 10 MG capsule TAKE 1 CAPSULE BY MOUTH TWICE  DAILY 12/06/22  Yes Lewayne Bunting, MD  tadalafil (CIALIS) 5 MG tablet Take 1 tablet (5 mg total) by mouth daily. Patient taking differently: Take 5 mg by mouth daily as needed for erectile dysfunction. 10/19/21  Yes Sharlene Dory, DO  traZODone (DESYREL) 50 MG tablet TAKE 1 TABLET BY MOUTH AT  BEDTIME AS NEEDED FOR SLEEP 10/14/22  Yes Wendling, Jilda Roche, DO  Accu-Chek FastClix Lancets MISC CHECK BLOOD SUGAR ONCE OR  TWICE  WEEKLY 04/25/23   Sharlene Dory, DO  atorvastatin (LIPITOR) 80 MG tablet TAKE 1 TABLET BY MOUTH AT  BEDTIME Patient not taking: Reported on 08/27/2023 12/31/22   Sharlene Dory, DO  Continuous Glucose Sensor (FREESTYLE LIBRE 3 SENSOR) MISC Use to check blood sugar 01/26/23   Wendling, Jilda Roche, DO     Family History  Problem Relation Age of Onset   Dementia Mother    Crohn's disease Mother    Heart disease Father 45       Valve replacement and CAD, CABG   Diabetes Father    CAD Sister 102   CAD Brother 92       Died age 64   Diabetes Brother    Kidney disease Brother    Colon cancer Neg Hx    Esophageal cancer Neg Hx    Stomach cancer Neg Hx    Rectal cancer Neg Hx    Pancreatic cancer Neg Hx     Social History   Socioeconomic History   Marital status: Significant Other  Spouse name: Not on file   Number of children: 2   Years of education: 53   Highest education level: Bachelor's degree (e.g., BA, AB, BS)  Occupational History   Occupation: Airline pilot- retired in 2018    Employer: [employer]  Tobacco Use   Smoking status: Former    Current packs/day: 0.00    Average packs/day: 1 pack/day for 45.0 years (45.0 ttl pk-yrs)    Types: Cigarettes    Start date: 01/16/1970    Quit date: 01/17/2015    Years since quitting: 8.6   Smokeless tobacco: Never  Vaping Use   Vaping status: Never Used  Substance and Sexual Activity   Alcohol use: Not Currently    Alcohol/week: 1.0 standard drink of alcohol    Types: 1 Glasses of wine per week    Comment: Previously 1 large vodka tonic per day Quit 06/2023   Drug use: No   Sexual activity: Yes  Other Topics Concern   Not on file  Social History Narrative   Fun: Boat, golf   Denies religious beliefs effecting health care.    Social Drivers of Corporate investment banker Strain: Low Risk  (08/20/2023)   Overall Financial Resource Strain (CARDIA)    Difficulty of Paying Living Expenses: Not hard at all   Food Insecurity: No Food Insecurity (08/27/2023)   Hunger Vital Sign    Worried About Running Out of Food in the Last Year: Never true    Ran Out of Food in the Last Year: Never true  Transportation Needs: No Transportation Needs (08/27/2023)   PRAPARE - Administrator, Civil Service (Medical): No    Lack of Transportation (Non-Medical): No  Physical Activity: Inactive (08/20/2023)   Exercise Vital Sign    Days of Exercise per Week: 0 days    Minutes of Exercise per Session: 90 min  Stress: Stress Concern Present (08/20/2023)   [employer] of Occupational Health - Occupational Stress Questionnaire    Feeling of Stress : To some extent  Social Connections: Socially Integrated (08/27/2023)   Social Connection and Isolation Panel [NHANES]    Frequency of Communication with Friends and Family: Twice a week    Frequency of Social Gatherings with Friends and Family: Once a week    Attends Religious Services: More than 4 times per year    Active Member of [employer] or Organizations: No    Attends Engineer, structural: More than 4 times per year    Marital Status: Living with partner       Review of Systems; Denies HA, CP, SOB, bleeding, abdominal pain, N/V  Vital Signs: BP (!) 141/71   Pulse 65   Temp 98.1 F (36.7 C) (Oral)   Resp 16   Ht 5\' 4"  (1.626 m)   Wt 170 lb (77.1 kg)   SpO2 99%   BMI 29.18 kg/m   Advance Care Plan: NO DOCUMENTS ON FILE.  Physical Exam Neuro: A&Ox4, calm, cooperative; Pulmonary: CTAB; Cardiac RRR with questionable systolic murmur, no extremity edema; Abd: soft, nondistended, nontender, BS present in all quadrants, without organomegaly; HEENT: R upper lip swelling, minimal.    Imaging: MR LIVER W WO CONTRAST Result Date: 08/28/2023 CLINICAL DATA:  Characterize liver lesion identified by prior CT EXAM: MRI ABDOMEN WITHOUT AND WITH CONTRAST TECHNIQUE: Multiplanar multisequence MR imaging of the abdomen was performed both before and  after the administration of intravenous contrast. CONTRAST:  8mL GADAVIST GADOBUTROL 1 MMOL/ML IV SOLN COMPARISON:  CT abdomen pelvis, 08/26/2023 FINDINGS: Lower chest: No acute abnormality. Hepatobiliary: Intrinsically T1 hypointense, heterogeneously T2 hyperintense lesion in the posterior right lobe of the liver measuring 2.8 x 2.2 cm somewhat serpiginous internal structure and ill-defined margins (series 4, image 16). Progressive internal contrast enhancement on multiphasic sequences without a characteristically benign enhancement pattern. Status post cholecystectomy. Unchanged biliary ductal dilatation, with postprocedural pneumobilia but without residual calculus. Common bile duct measures up to 1.0 cm in caliber (series 3, image 19). Status post cholecystectomy with a probable small gallbladder remnant and surgical material in the gallbladder fossa. Pancreas: Unremarkable. No pancreatic ductal dilatation or surrounding inflammatory changes. Spleen: Normal in size without significant abnormality. Adrenals/Urinary Tract: Adrenal glands are unremarkable. Kidneys are normal, without renal calculi, solid lesion, or hydronephrosis. Stomach/Bowel: Stomach is within normal limits. Periampullary duodenal diverticulum. No evidence of bowel wall thickening, distention, or inflammatory changes. Vascular/Lymphatic: Aortic atherosclerosis. No enlarged abdominal lymph nodes. Other: No abdominal wall hernia or abnormality. No ascites. Musculoskeletal: No acute or significant osseous findings. IMPRESSION: 1. Lesion in the posterior right lobe of the liver measuring 2.8 x 2.2 cm with unusual appearance and progressive internal contrast enhancement on multiphasic sequences without a characteristically benign enhancement pattern. Given lack of clearly benign features, this is highly suspicious for malignancy or metastasis. Consider PET-CT for metabolic characterization and tissue sampling. 2. No other suspicious liver lesions. No  other evidence of malignancy or metastatic disease in the abdomen. 3. Status post cholecystectomy. Unchanged biliary ductal dilatation, with postprocedural pneumobilia but without residual calculus. Common bile duct measures up to 1.0 cm in caliber. Aortic Atherosclerosis (ICD10-I70.0). Electronically Signed   By: Jearld Lesch M.D.   On: 08/28/2023 21:25   DG ERCP Result Date: 08/27/2023 CLINICAL DATA:  Biliary obstruction EXAM: ERCP TECHNIQUE: Multiple spot images obtained with the fluoroscopic device and submitted for interpretation post-procedure. FLUOROSCOPY: Radiation Exposure Index (as provided by the fluoroscopic device): 93.1 mGy Kerma COMPARISON:  CT abdomen pelvis with contrast 08/26/2023 FINDINGS: Thirteen intraoperative fluoroscopic images were provided for interpretation. The submitted images demonstrate cannulation and opacification of the intra and extrahepatic bile ducts. Balloon sweep of the common bile duct was performed. No significant filling defects identified within the common bile duct on the final images. IMPRESSION: Intraoperative cholangiogram demonstrates balloon sweep of the common bile duct. These images were submitted for radiologic interpretation only. Please see the procedural report for the amount of contrast and the fluoroscopy time utilized. Electronically Signed   By: Acquanetta Belling M.D.   On: 08/27/2023 19:17   CT ABDOMEN PELVIS W CONTRAST Result Date: 08/26/2023 CLINICAL DATA:  One-month history of right upper quadrant pain. Weakness. Elevated liver function tests. EXAM: CT ABDOMEN AND PELVIS WITH CONTRAST TECHNIQUE: Multidetector CT imaging of the abdomen and pelvis was performed using the standard protocol following bolus administration of intravenous contrast. RADIATION DOSE REDUCTION: This exam was performed according to the departmental dose-optimization program which includes automated exposure control, adjustment of the mA and/or kV according to patient size and/or  use of iterative reconstruction technique. CONTRAST:  OMNIPAQUE IOHEXOL 300 MG/ML  SOLN COMPARISON:  Lung cancer screening CT 07/22/2023. No prior abdominal CTs available. A report of an abdominopelvic CT of 07/26/2001 is reviewed. FINDINGS: Lower chest: Clear lung bases. Mitral and aortic valve calcification. Coronary artery calcification. Hepatobiliary: Posterior right hepatic lobe hypoattenuating 2.5 x 2.3 cm lesion on 20/2. Cholecystectomy. Hypoattenuation in the operative bed including up to 2.1 cm in 25/2 could represent a combination of focal steatosis and  possibly a gallbladder or cystic duct remnant. No surrounding inflammation. Since 07/22/2023, development of moderate intra and extrahepatic biliary duct dilatation. Common duct dilatation is followed to the level of a 1.7 cm soft tissue density within the distal common duct including on coronal image 67. Pancreas: Normal, without mass or ductal dilatation. Spleen: Normal in size, without focal abnormality. Adrenals/Urinary Tract: Mild right adrenal thickening. Interpolar right renal 3.3 cm cyst. Bilateral too small to characterize renal lesions are most likely cysts . In the absence of clinically indicated signs/symptoms require(s) no independent follow-up. No hydronephrosis. Apparent mild bladder wall thickening is favored to be due to underdistention. Stomach/Bowel: Normal stomach, without wall thickening. Extensive colonic diverticulosis. Normal terminal ileum and appendix. Normal small bowel. Vascular/Lymphatic: Advanced aortic and branch vessel atherosclerosis. No abdominopelvic adenopathy. Reproductive: Mild prostatomegaly. Other: No significant free fluid. Small bilateral fat containing inguinal hernias. Musculoskeletal: No acute osseous abnormality. IMPRESSION: 1. Interval development, since 07/22/2023, of moderate biliary duct dilatation secondary to soft tissue density within the distal common duct. Most likely noncalcified  choledocholithiasis. A soft tissue neoplasm such as extrahepatic cholangiocarcinoma could look similar but is felt less likely. Recommend further evaluation with ERCP. 2. Right hepatic lobe hypoattenuating lesion is indeterminate. Correlate with any history of primary malignancy. Consider further evaluation with outpatient, nonemergent pre and post contrast abdominal MRI. 3. Cholecystectomy. Hypoattenuation in the region of the operative bed could represent focal hepatic steatosis and/or a gallbladder/cystic duct remnant. No surrounding inflammation to suggest acute clinical significance. 4. Incidental findings, including: Coronary artery atherosclerosis. Aortic Atherosclerosis (ICD10-I70.0). Mitral and aortic valvular calcifications for which echocardiography should be considered. Prostatomegaly. Electronically Signed   By: Jeronimo Greaves M.D.   On: 08/26/2023 21:04   DG Chest Port 1 View Result Date: 07/30/2023 CLINICAL DATA:  Severe fatigue, facial rash and decreased appetite EXAM: PORTABLE CHEST 1 VIEW COMPARISON:  02/08/2016 FINDINGS: Stable cardiomediastinal silhouette. Aortic atherosclerotic calcification. Bibasilar atelectasis or infiltrates. No pleural effusion or pneumothorax. IMPRESSION: Bibasilar atelectasis or infiltrates. Electronically Signed   By: Minerva Fester M.D.   On: 07/30/2023 22:28    Labs:  CBC: Recent Labs    08/26/23 1626 08/27/23 0551 08/28/23 0359 08/29/23 0356  WBC 10.5 11.8* 9.1 7.2  HGB 12.7* 10.5* 10.7* 9.9*  HCT 40.0 34.2* 34.4* 31.7*  PLT 255 183 204 207    COAGS: Recent Labs    08/27/23 0945  INR 1.1    BMP: Recent Labs    08/26/23 1626 08/27/23 0551 08/28/23 0359 08/29/23 0356  NA 134* 139 137 138  K 3.7 3.6 4.4 4.2  CL 101 106 105 104  CO2 20* 25 23 26   GLUCOSE 187* 142* 245* 225*  BUN 11 17 26* 21  CALCIUM 9.0 8.4* 8.4* 8.4*  CREATININE 0.65 1.01 1.05 1.03  GFRNONAA >60 >60 >60 >60    LIVER FUNCTION TESTS: Recent Labs     08/26/23 1626 08/27/23 0551 08/28/23 0359 08/29/23 0356  BILITOT 5.8* 5.6* 2.0* 1.4*  AST 476* 252* 97* 35  ALT 489* 317* 217* 138*  ALKPHOS 550* 390* 335* 252*  PROT 7.3 5.8* 5.6* 5.5*  ALBUMIN 3.6 2.8* 2.7* 2.7*    TUMOR MARKERS: No results for input(s): "AFPTM", "CEA", "CA199", "CHROMGRNA" in the last 8760 hours.  Assessment and Plan:  Ascending cholangitis s/p ERCP Continued management by GI and IM  Liver lesion suspicious for malignancy noted on recent imaging -Scheduled biopsy 08/30/23 -NPO after MN, except may have sips with meds -Hold lovenox dose x1 -Labs  reviewed, will review 5 AM labs day of procedure prior to puncture   Thank you for allowing our service to participate in Sammy Cassar 's care.  Electronically Signed: D. Jeananne Rama, PA-C/  Carlton Adam, FNP-C 08/29/2023, 4:15 PM      I spent a total of 40 Minutes    in face to face in clinical consultation, greater than 50% of which was counseling/coordinating care for IMAGE GUIDED LIVER LESION BIOPSY

## 2023-08-29 NOTE — Anesthesia Postprocedure Evaluation (Signed)
 Anesthesia Post Note  Patient: Troy Boyer  Procedure(s) Performed: ENDOSCOPIC RETROGRADE CHOLANGIOPANCREATOGRAPHY (ERCP) BALLOON DILATION REMOVAL OF STONES SPHINCTEROTOMY     Patient location during evaluation: PACU Anesthesia Type: General Level of consciousness: awake and alert Pain management: pain level controlled Vital Signs Assessment: post-procedure vital signs reviewed and stable Respiratory status: spontaneous breathing, nonlabored ventilation, respiratory function stable and patient connected to nasal cannula oxygen Cardiovascular status: blood pressure returned to baseline and stable Postop Assessment: no apparent nausea or vomiting Anesthetic complications: no   No notable events documented.  Last Vitals:  Vitals:   08/29/23 0441 08/29/23 0950  BP: 99/86 136/68  Pulse: 65 64  Resp: 18 16  Temp: 37 C 36.9 C  SpO2: 97% 98%    Last Pain:  Vitals:   08/29/23 0950  TempSrc: Oral  PainSc:                  Nelle Don Rosealee Recinos

## 2023-08-29 NOTE — Plan of Care (Signed)
   Problem: Education: Goal: Ability to describe self-care measures that may prevent or decrease complications (Diabetes Survival Skills Education) will improve Outcome: Progressing Goal: Individualized Educational Video(s) Outcome: Progressing   Problem: Coping: Goal: Ability to adjust to condition or change in health will improve Outcome: Progressing   Problem: Fluid Volume: Goal: Ability to maintain a balanced intake and output will improve Outcome: Progressing   Problem: Metabolic: Goal: Ability to maintain appropriate glucose levels will improve Outcome: Progressing   Problem: Nutritional: Goal: Maintenance of adequate nutrition will improve Outcome: Progressing Goal: Progress toward achieving an optimal weight will improve Outcome: Progressing   Problem: Skin Integrity: Goal: Risk for impaired skin integrity will decrease Outcome: Progressing   Problem: Tissue Perfusion: Goal: Adequacy of tissue perfusion will improve Outcome: Progressing   Problem: Education: Goal: Knowledge of General Education information will improve Description: Including pain rating scale, medication(s)/side effects and non-pharmacologic comfort measures Outcome: Progressing   Problem: Health Behavior/Discharge Planning: Goal: Ability to manage health-related needs will improve Outcome: Progressing   Problem: Clinical Measurements: Goal: Ability to maintain clinical measurements within normal limits will improve Outcome: Progressing Goal: Will remain free from infection Outcome: Progressing Goal: Diagnostic test results will improve Outcome: Progressing Goal: Respiratory complications will improve Outcome: Progressing Goal: Cardiovascular complication will be avoided Outcome: Progressing   Problem: Activity: Goal: Risk for activity intolerance will decrease Outcome: Progressing   Problem: Nutrition: Goal: Adequate nutrition will be maintained Outcome: Progressing   Problem:  Coping: Goal: Level of anxiety will decrease Outcome: Progressing   Problem: Elimination: Goal: Will not experience complications related to bowel motility Outcome: Progressing Goal: Will not experience complications related to urinary retention Outcome: Progressing   Problem: Pain Managment: Goal: General experience of comfort will improve and/or be controlled Outcome: Progressing   Problem: Safety: Goal: Ability to remain free from injury will improve Outcome: Progressing   Problem: Skin Integrity: Goal: Risk for impaired skin integrity will decrease Outcome: Progressing

## 2023-08-29 NOTE — Progress Notes (Signed)
 Progress Note   LOS: 3 days   Chief Complaint: Ascending cholangitis s/p ERCP   Subjective   Patient is doing well today and has no complaints.  Would like to take a shower.  Tolerating diet without difficulty.  No pain, nausea, vomiting   Objective   Vital signs in last 24 hours: Temp:  [98.4 F (36.9 C)-98.8 F (37.1 C)] 98.4 F (36.9 C) (03/03 0950) Pulse Rate:  [64-68] 64 (03/03 0950) Resp:  [16-18] 16 (03/03 0950) BP: (99-136)/(67-86) 136/68 (03/03 0950) SpO2:  [97 %-100 %] 98 % (03/03 0950) Last BM Date : 08/27/23 Last BM recorded by nurses in past 5 days Stool Type: Type 5 (Soft blobs with clear-cut edges) (08/27/2023  6:26 PM)  General:   male in no acute distress Heart:  Regular rate and rhythm; no murmurs Pulm: Clear anteriorly; no wheezing Abdomen: soft, nondistended, normal bowel sounds in all quadrants. Nontender without guarding. No organomegaly appreciated. Extremities:  No edema Neurologic:  Alert and  oriented x4;  No focal deficits.  Psych:  Cooperative. Normal mood and affect.  Intake/Output from previous day: 03/02 0701 - 03/03 0700 In: 1634.8 [P.O.:480; I.V.:995.9; IV Piggyback:158.9] Out: -  Intake/Output this shift: No intake/output data recorded.  Studies/Results: MR LIVER W WO CONTRAST Result Date: 08/28/2023 CLINICAL DATA:  Characterize liver lesion identified by prior CT EXAM: MRI ABDOMEN WITHOUT AND WITH CONTRAST TECHNIQUE: Multiplanar multisequence MR imaging of the abdomen was performed both before and after the administration of intravenous contrast. CONTRAST:  8mL GADAVIST GADOBUTROL 1 MMOL/ML IV SOLN COMPARISON:  CT abdomen pelvis, 08/26/2023 FINDINGS: Lower chest: No acute abnormality. Hepatobiliary: Intrinsically T1 hypointense, heterogeneously T2 hyperintense lesion in the posterior right lobe of the liver measuring 2.8 x 2.2 cm somewhat serpiginous internal structure and ill-defined margins (series 4, image 16). Progressive internal  contrast enhancement on multiphasic sequences without a characteristically benign enhancement pattern. Status post cholecystectomy. Unchanged biliary ductal dilatation, with postprocedural pneumobilia but without residual calculus. Common bile duct measures up to 1.0 cm in caliber (series 3, image 19). Status post cholecystectomy with a probable small gallbladder remnant and surgical material in the gallbladder fossa. Pancreas: Unremarkable. No pancreatic ductal dilatation or surrounding inflammatory changes. Spleen: Normal in size without significant abnormality. Adrenals/Urinary Tract: Adrenal glands are unremarkable. Kidneys are normal, without renal calculi, solid lesion, or hydronephrosis. Stomach/Bowel: Stomach is within normal limits. Periampullary duodenal diverticulum. No evidence of bowel wall thickening, distention, or inflammatory changes. Vascular/Lymphatic: Aortic atherosclerosis. No enlarged abdominal lymph nodes. Other: No abdominal wall hernia or abnormality. No ascites. Musculoskeletal: No acute or significant osseous findings. IMPRESSION: 1. Lesion in the posterior right lobe of the liver measuring 2.8 x 2.2 cm with unusual appearance and progressive internal contrast enhancement on multiphasic sequences without a characteristically benign enhancement pattern. Given lack of clearly benign features, this is highly suspicious for malignancy or metastasis. Consider PET-CT for metabolic characterization and tissue sampling. 2. No other suspicious liver lesions. No other evidence of malignancy or metastatic disease in the abdomen. 3. Status post cholecystectomy. Unchanged biliary ductal dilatation, with postprocedural pneumobilia but without residual calculus. Common bile duct measures up to 1.0 cm in caliber. Aortic Atherosclerosis (ICD10-I70.0). Electronically Signed   By: Jearld Lesch M.D.   On: 08/28/2023 21:25   DG ERCP Result Date: 08/27/2023 CLINICAL DATA:  Biliary obstruction EXAM: ERCP  TECHNIQUE: Multiple spot images obtained with the fluoroscopic device and submitted for interpretation post-procedure. FLUOROSCOPY: Radiation Exposure Index (as provided by the fluoroscopic device):  93.1 mGy Kerma COMPARISON:  CT abdomen pelvis with contrast 08/26/2023 FINDINGS: Thirteen intraoperative fluoroscopic images were provided for interpretation. The submitted images demonstrate cannulation and opacification of the intra and extrahepatic bile ducts. Balloon sweep of the common bile duct was performed. No significant filling defects identified within the common bile duct on the final images. IMPRESSION: Intraoperative cholangiogram demonstrates balloon sweep of the common bile duct. These images were submitted for radiologic interpretation only. Please see the procedural report for the amount of contrast and the fluoroscopy time utilized. Electronically Signed   By: Acquanetta Belling M.D.   On: 08/27/2023 19:17    Lab Results: Recent Labs    08/27/23 0551 08/28/23 0359 08/29/23 0356  WBC 11.8* 9.1 7.2  HGB 10.5* 10.7* 9.9*  HCT 34.2* 34.4* 31.7*  PLT 183 204 207   BMET Recent Labs    08/27/23 0551 08/28/23 0359 08/29/23 0356  NA 139 137 138  K 3.6 4.4 4.2  CL 106 105 104  CO2 25 23 26   GLUCOSE 142* 245* 225*  BUN 17 26* 21  CREATININE 1.01 1.05 1.03  CALCIUM 8.4* 8.4* 8.4*   LFT Recent Labs    08/29/23 0356  PROT 5.5*  ALBUMIN 2.7*  AST 35  ALT 138*  ALKPHOS 252*  BILITOT 1.4*   PT/INR Recent Labs    08/27/23 0945  LABPROT 14.4  INR 1.1     Scheduled Meds:  enoxaparin (LOVENOX) injection  40 mg Subcutaneous Q24H   insulin aspart  0-15 Units Subcutaneous TID WC   insulin aspart  0-5 Units Subcutaneous QHS   insulin glargine-yfgn  5 Units Subcutaneous Q2200   mirabegron ER  50 mg Oral Daily   sodium chloride flush  3 mL Intravenous Q12H   Continuous Infusions:  ampicillin-sulbactam (UNASYN) IV       Impression:   Ascending cholangitis  2.5 cm liver  lesion s/p ERCP with extension of biliary sphincterotomy, stone extraction by Dr. Marina Goodell 3/1 LFTs improving CA 19-9 518 (drawn same day as ERCP) AFP and CEA pending MRI liver shows 2.8 x 2.2 cm liver lesion suspicious for malignancy.  Reassuringly LFTs are improving and likely will continue to improve s/p biliary sphincterotomy.  However, MRI does show a suspicious liver lesion.  CA 19-9 possibly elevated due to recent obstruction since it was drawn the same day as ERCP but other tumor markers are pending.  Although CT imaging for liver lesion is concerning for malignancy, with the patient presenting with biliary obstruction could still consider abscess/infectious process as etiology for liver lesion but unknown until definitive biopsies.   Plan:   - Consult IR for consideration of liver biopsy - Continue to trend LFTs - Follow tumor markers  Itzy Adler Leanna Sato  08/29/2023, 12:30 PM

## 2023-08-30 ENCOUNTER — Ambulatory Visit (HOSPITAL_BASED_OUTPATIENT_CLINIC_OR_DEPARTMENT_OTHER): Payer: Medicare Other

## 2023-08-30 ENCOUNTER — Inpatient Hospital Stay (HOSPITAL_COMMUNITY)

## 2023-08-30 DIAGNOSIS — K831 Obstruction of bile duct: Secondary | ICD-10-CM | POA: Diagnosis not present

## 2023-08-30 LAB — COMPREHENSIVE METABOLIC PANEL
ALT: 106 U/L — ABNORMAL HIGH (ref 0–44)
AST: 33 U/L (ref 15–41)
Albumin: 2.8 g/dL — ABNORMAL LOW (ref 3.5–5.0)
Alkaline Phosphatase: 226 U/L — ABNORMAL HIGH (ref 38–126)
Anion gap: 9 (ref 5–15)
BUN: 12 mg/dL (ref 8–23)
CO2: 26 mmol/L (ref 22–32)
Calcium: 8.2 mg/dL — ABNORMAL LOW (ref 8.9–10.3)
Chloride: 103 mmol/L (ref 98–111)
Creatinine, Ser: 0.97 mg/dL (ref 0.61–1.24)
GFR, Estimated: >60 mL/min (ref >60)
Glucose, Bld: 187 mg/dL — ABNORMAL HIGH (ref 70–99)
Potassium: 3.6 mmol/L (ref 3.5–5.1)
Sodium: 138 mmol/L (ref 135–145)
Total Bilirubin: 1.2 mg/dL (ref 0.0–1.2)
Total Protein: 5.5 g/dL — ABNORMAL LOW (ref 6.5–8.1)

## 2023-08-30 LAB — CBC WITH DIFFERENTIAL/PLATELET
Abs Immature Granulocytes: 0.04 10*3/uL (ref 0.00–0.07)
Basophils Absolute: 0 10*3/uL (ref 0.0–0.1)
Basophils Relative: 1 %
Eosinophils Absolute: 0 10*3/uL (ref 0.0–0.5)
Eosinophils Relative: 1 %
HCT: 31.9 % — ABNORMAL LOW (ref 39.0–52.0)
Hemoglobin: 9.8 g/dL — ABNORMAL LOW (ref 13.0–17.0)
Immature Granulocytes: 1 %
Lymphocytes Relative: 41 %
Lymphs Abs: 2.4 10*3/uL (ref 0.7–4.0)
MCH: 28.9 pg (ref 26.0–34.0)
MCHC: 30.7 g/dL (ref 30.0–36.0)
MCV: 94.1 fL (ref 80.0–100.0)
Monocytes Absolute: 0.7 10*3/uL (ref 0.1–1.0)
Monocytes Relative: 12 %
Neutro Abs: 2.6 10*3/uL (ref 1.7–7.7)
Neutrophils Relative %: 44 %
Platelets: 227 10*3/uL (ref 150–400)
RBC: 3.39 MIL/uL — ABNORMAL LOW (ref 4.22–5.81)
RDW: 16.2 % — ABNORMAL HIGH (ref 11.5–15.5)
WBC: 5.9 10*3/uL (ref 4.0–10.5)
nRBC: 0 % (ref 0.0–0.2)

## 2023-08-30 LAB — GLUCOSE, CAPILLARY
Glucose-Capillary: 152 mg/dL — ABNORMAL HIGH (ref 70–99)
Glucose-Capillary: 108 mg/dL — ABNORMAL HIGH (ref 70–99)

## 2023-08-30 LAB — AFP TUMOR MARKER: AFP, Serum, Tumor Marker: <1.8 ng/mL (ref 0.0–8.4)

## 2023-08-30 LAB — CEA: CEA: 1.2 ng/mL (ref 0.0–4.7)

## 2023-08-30 MED ORDER — DIPHENHYDRAMINE HCL 50 MG/ML IJ SOLN
INTRAMUSCULAR | Status: AC
  Filled 2023-08-30: qty 1

## 2023-08-30 MED ORDER — GELATIN ABSORBABLE 12-7 MM EX MISC
CUTANEOUS | Status: AC
  Filled 2023-08-30: qty 1

## 2023-08-30 MED ORDER — AMOXICILLIN-POT CLAVULANATE 875-125 MG PO TABS: 1.0000 | ORAL_TABLET | Freq: Two times a day (BID) | ORAL | 0 refills | Status: AC

## 2023-08-30 MED ORDER — SULFUR HEXAFLUORIDE MICROSPH 60.7-25 MG IJ SUSR
INTRAMUSCULAR | Status: AC
  Filled 2023-08-30: qty 5

## 2023-08-30 MED ORDER — VALACYCLOVIR HCL 1 G PO TABS: 1000.0000 mg | ORAL_TABLET | Freq: Every day | ORAL | 0 refills | Status: AC

## 2023-08-30 MED ORDER — SULFUR HEXAFLUORIDE MICROSPH 60.7-25 MG IJ SUSR
5.0000 mL | Freq: Once | INTRAMUSCULAR | Status: DC | PRN
Start: 2023-08-30 — End: 2023-08-30

## 2023-08-30 MED ORDER — FENTANYL CITRATE (PF) 100 MCG/2ML IJ SOLN
INTRAMUSCULAR | Status: AC
  Filled 2023-08-30: qty 2

## 2023-08-30 MED ORDER — LIDOCAINE HCL 1 % IJ SOLN
INTRAMUSCULAR | Status: AC
  Filled 2023-08-30: qty 20

## 2023-08-30 MED ORDER — DIPHENHYDRAMINE HCL 50 MG/ML IJ SOLN
INTRAMUSCULAR | Status: DC | PRN
  Administered 2023-08-30: 25 mg via INTRAVENOUS

## 2023-08-30 MED ORDER — SULFUR HEXAFLUORIDE MICROSPH 60.7-25 MG IJ SUSR
INTRAMUSCULAR | Status: DC | PRN
  Administered 2023-08-30: 2.4 mL via INTRAVENOUS

## 2023-08-30 MED ORDER — MIDAZOLAM HCL 2 MG/2ML IJ SOLN
INTRAMUSCULAR | Status: AC
  Filled 2023-08-30: qty 2

## 2023-08-30 NOTE — Plan of Care (Signed)
   Problem: Coping: Goal: Ability to adjust to condition or change in health will improve Outcome: Progressing

## 2023-08-30 NOTE — Discharge Summary (Signed)
 Physician Discharge Summary   Patient: Troy Boyer MRN: [id number] DOB: [birth year]  Admit date:     08/26/2023  Discharge date: 08/30/23  Discharge Physician: Briant Cedar   PCP: Sharlene Dory, DO   Recommendations at discharge:   Follow-up with PCP with repeat labs Follow-up with GI for further imaging and follow-up  Discharge Diagnoses: Principal Problem:   Biliary obstruction Active Problems:   Essential hypertension   Mixed hyperlipidemia   CAD (coronary artery disease)   RUQ pain   Cholangitis   Type 2 diabetes mellitus (HCC)   Transaminitis   Abnormal CT of the abdomen   Choledocholithiasis   Liver lesion   Abnormal MRI, liver   Abnormal LFTs    Hospital Course: Troy Boyer is a 73 y.o. male with medical history significant for CAD s/p prior stenting, T2DM, HTN, HLD, s/p cholecystectomy, hx of choledocholithiasis who presented to the ED for evaluation of worsening RUQ abdominal pain X 2 weeks with associated nausea/vomiting, drenching night sweats, with weight loss. Pt was seen by his PCP PTA and was found to have newly elevated LFTs.  He was referred to GI and was seen in office by Dr. Adela Lank on 2/26.  In the ED, patient noted to be febrile, heart rate 90, otherwise unremarkable.  Labs showed AST 476, ALT 489, alk phos 550, total bilirubin 5.8, lipase 47, sodium 134, WBC 10.5. UA negative for UTI. SARS-CoV-2, influenza, RSV PCR negative. CT a/p with interval development, since 07/22/2023, of moderate biliary duct dilatation secondary to soft tissue density within the distal common duct. Most likely noncalcified choledocholithiasis. A soft tissue neoplasm such as extrahepatic cholangiocarcinoma could look similar but is felt less likely. GI consulted.  Patient admitted for further management.    Today, pt denies any new complaints, reports resolved N/V or abdominal pain. Very eager to discharge. GI ok to discharge from their standpoint. Follow up  with PCP with repeat labs and GI for further management.  Patient very eager to be discharged    Assessment and Plan:  Ascending cholangitis Likely 2/2 recurrent CBD stones/choledocholithiasis Presented with Tmax of 105 Currently afebrile, with resolved leukocytosis CT shows moderate biliary duct dilatation secondary to soft tissue density within distal common duct.  Suspected due to choledocholithiasis although soft tissue neoplasm possible GI consulted, status post ERCP on 08/27/2023 showed choledocholithiasis status post biliary sphincterotomy, balloon sphincteroplasty and CBD stone extraction, noted cholangitis, indeterminate liver lesion possibly abscess, rule out  Ca 19-9-->518 MRI liver showed lesion in the posterior right lobe measuring 2.8 x 2.2, highly suspicious for malignancy or metastasis. IR consulted for liver biopsy on 08/30/23, unable to be done due to the mass too close to the pleura for safe biopsy.  IR recommended consideration of repeating MRI abdomen with contrast in 2 to 3 months to assess for any changes. IV Zosyn---> de-escalate to IV Unasyn, switch to p.o. Augmentin to complete 10 days of antibiotics Follow-up with PCP with repeat labs Follow-up with GI with repeat imaging   Possible E. coli bacteremia Likely source from above BC x 2, growing pansensitive E. Coli, repeat BC x 2 pending, PCP/GI to follow-up IV Zosyn---> de-escalate to IV Unasyn, switch to p.o. Augmentin to complete 10 days of antibiotics   Elevated LFTs Likely 2/2 possible cholangitis Avoid hepatotoxic medications   Cold sores/herpes labialis Noted cold sore located in L nasal cavity Reports he gets it in his nose and sometimes spread over his oral cavity Continue Valtrex x 5 days  Coronary artery disease: Continue Lopressor, holding statin   Diabetes mellitus type 2, with hyperglycemia Last A1c 7.6 on 07/2023 Continue home regimen   Hypertension Continue Lopressor, amlodipine    Hyperlipidemia Holding statin given transaminitis.    Consultants: GI, IR Procedures performed: ERCP Disposition: Home Diet recommendation:  Cardiac and Carb modified diet     DISCHARGE MEDICATION: Allergies as of 08/30/2023       Reactions   Other Rash   ECG electrodes, per patient   Protonix [pantoprazole] Other (See Comments)   Exacerbated reflux s/s's        Medication List     PAUSE taking these medications    atorvastatin 80 MG tablet Wait to take this until your doctor or other care provider tells you to start again. Commonly known as: LIPITOR TAKE 1 TABLET BY MOUTH AT  BEDTIME       TAKE these medications    Accu-Chek FastClix Lancets Misc CHECK BLOOD SUGAR ONCE OR  TWICE WEEKLY   Accu-Chek Guide test strip Generic drug: glucose blood Use daily to check blood sugar.  DX E11.65   amLODipine 5 MG tablet Commonly known as: NORVASC TAKE 1 TABLET BY MOUTH DAILY   amoxicillin-clavulanate 875-125 MG tablet Commonly known as: AUGMENTIN Take 1 tablet by mouth 2 (two) times daily for 5 days. Start taking on: August 31, 2023   ezetimibe 10 MG tablet Commonly known as: ZETIA Take 1 tablet by mouth once daily   fenofibrate 48 MG tablet Commonly known as: TRICOR TAKE 1 TABLET BY MOUTH DAILY   FreeStyle Libre 3 Sensor Misc Use to check blood sugar   Krill Oil 500 MG Caps Take 500 mg by mouth 2 (two) times daily.   metFORMIN 500 MG 24 hr tablet Commonly known as: GLUCOPHAGE-XR Take 2 tablets (1,000 mg total) by mouth daily with breakfast.   metoprolol tartrate 25 MG tablet Commonly known as: LOPRESSOR Take 1 tablet (25 mg total) by mouth 2 (two) times daily. What changed: when to take this   multivitamin with minerals Tabs tablet Take 1 tablet by mouth daily.   Myrbetriq 50 MG Tb24 tablet Generic drug: mirabegron ER Take 50 mg by mouth daily.   nitroGLYCERIN 0.4 MG SL tablet Commonly known as: NITROSTAT Place 1 tablet (0.4 mg total) under  the tongue every 5 (five) minutes as needed for chest pain.   omeprazole 40 MG capsule Commonly known as: PRILOSEC Take 1 capsule (40 mg total) by mouth daily before breakfast.   ondansetron 4 MG disintegrating tablet Commonly known as: ZOFRAN-ODT Take 4 mg by mouth every 8 (eight) hours as needed for nausea or vomiting.   pioglitazone 45 MG tablet Commonly known as: Actos Take 1 tablet (45 mg total) by mouth daily.   ramipril 10 MG capsule Commonly known as: ALTACE TAKE 1 CAPSULE BY MOUTH TWICE  DAILY   tadalafil 5 MG tablet Commonly known as: CIALIS Take 1 tablet (5 mg total) by mouth daily. What changed:  when to take this reasons to take this   traZODone 50 MG tablet Commonly known as: DESYREL TAKE 1 TABLET BY MOUTH AT  BEDTIME AS NEEDED FOR SLEEP   valACYclovir 1000 MG tablet Commonly known as: VALTREX Take 1 tablet (1,000 mg total) by mouth daily for 3 days. Start taking on: August 31, 2023        Follow-up Information     Sharlene Dory, [place]. Schedule an appointment as soon as possible for a visit.  Specialty: Family Medicine Contact information: [address] [place] [address] [phone]         Benancio Deeds, MD Follow up.   Specialty: Gastroenterology Why: follow up Contact information: [address] [place] [address] [phone]                Discharge Exam: Ceasar Mons Weights   08/26/23 1553  Weight: 77.1 kg   General: NAD  Cardiovascular: S1, S2 present Respiratory: CTAB Abdomen: Soft, nontender, nondistended, bowel sounds present Musculoskeletal: No bilateral pedal edema noted Skin: Normal Psychiatry: Normal mood   Condition at discharge: stable  The results of significant diagnostics from this hospitalization (including imaging, microbiology, ancillary and laboratory) are listed below for reference.   Imaging Studies: MR LIVER W WO CONTRAST Result Date: 08/28/2023 CLINICAL  DATA:  Characterize liver lesion identified by prior CT EXAM: MRI ABDOMEN WITHOUT AND WITH CONTRAST TECHNIQUE: Multiplanar multisequence MR imaging of the abdomen was performed both before and after the administration of intravenous contrast. CONTRAST:  8mL GADAVIST GADOBUTROL 1 MMOL/ML IV SOLN COMPARISON:  CT abdomen pelvis, 08/26/2023 FINDINGS: Lower chest: No acute abnormality. Hepatobiliary: Intrinsically T1 hypointense, heterogeneously T2 hyperintense lesion in the posterior right lobe of the liver measuring 2.8 x 2.2 cm somewhat serpiginous internal structure and ill-defined margins (series 4, image 16). Progressive internal contrast enhancement on multiphasic sequences without a characteristically benign enhancement pattern. Status post cholecystectomy. Unchanged biliary ductal dilatation, with postprocedural pneumobilia but without residual calculus. Common bile duct measures up to 1.0 cm in caliber (series 3, image 19). Status post cholecystectomy with a probable small gallbladder remnant and surgical material in the gallbladder fossa. Pancreas: Unremarkable. No pancreatic ductal dilatation or surrounding inflammatory changes. Spleen: Normal in size without significant abnormality. Adrenals/Urinary Tract: Adrenal glands are unremarkable. Kidneys are normal, without renal calculi, solid lesion, or hydronephrosis. Stomach/Bowel: Stomach is within normal limits. Periampullary duodenal diverticulum. No evidence of bowel wall thickening, distention, or inflammatory changes. Vascular/Lymphatic: Aortic atherosclerosis. No enlarged abdominal lymph nodes. Other: No abdominal wall hernia or abnormality. No ascites. Musculoskeletal: No acute or significant osseous findings. IMPRESSION: 1. Lesion in the posterior right lobe of the liver measuring 2.8 x 2.2 cm with unusual appearance and progressive internal contrast enhancement on multiphasic sequences without a characteristically benign enhancement pattern. Given lack  of clearly benign features, this is highly suspicious for malignancy or metastasis. Consider PET-CT for metabolic characterization and tissue sampling. 2. No other suspicious liver lesions. No other evidence of malignancy or metastatic disease in the abdomen. 3. Status post cholecystectomy. Unchanged biliary ductal dilatation, with postprocedural pneumobilia but without residual calculus. Common bile duct measures up to 1.0 cm in caliber. Aortic Atherosclerosis (ICD10-I70.0). Electronically Signed   By: Jearld Lesch M.D.   On: 08/28/2023 21:25   DG ERCP Result Date: 08/27/2023 CLINICAL DATA:  Biliary obstruction EXAM: ERCP TECHNIQUE: Multiple spot images obtained with the fluoroscopic device and submitted for interpretation post-procedure. FLUOROSCOPY: Radiation Exposure Index (as provided by the fluoroscopic device): 93.1 mGy Kerma COMPARISON:  CT abdomen pelvis with contrast 08/26/2023 FINDINGS: Thirteen intraoperative fluoroscopic images were provided for interpretation. The submitted images demonstrate cannulation and opacification of the intra and extrahepatic bile ducts. Balloon sweep of the common bile duct was performed. No significant filling defects identified within the common bile duct on the final images. IMPRESSION: Intraoperative cholangiogram demonstrates balloon sweep of the common bile duct. These images were submitted for radiologic interpretation only. Please see the procedural report  for the amount of contrast and the fluoroscopy time utilized. Electronically Signed   By: Acquanetta Belling M.D.   On: 08/27/2023 19:17   CT ABDOMEN PELVIS W CONTRAST Result Date: 08/26/2023 CLINICAL DATA:  One-month history of right upper quadrant pain. Weakness. Elevated liver function tests. EXAM: CT ABDOMEN AND PELVIS WITH CONTRAST TECHNIQUE: Multidetector CT imaging of the abdomen and pelvis was performed using the standard protocol following bolus administration of intravenous contrast. RADIATION DOSE  REDUCTION: This exam was performed according to the departmental dose-optimization program which includes automated exposure control, adjustment of the mA and/or kV according to patient size and/or use of iterative reconstruction technique. CONTRAST:  OMNIPAQUE IOHEXOL 300 MG/ML  SOLN COMPARISON:  Lung cancer screening CT 07/22/2023. No prior abdominal CTs available. A report of an abdominopelvic CT of 07/26/2001 is reviewed. FINDINGS: Lower chest: Clear lung bases. Mitral and aortic valve calcification. Coronary artery calcification. Hepatobiliary: Posterior right hepatic lobe hypoattenuating 2.5 x 2.3 cm lesion on 20/2. Cholecystectomy. Hypoattenuation in the operative bed including up to 2.1 cm in 25/2 could represent a combination of focal steatosis and possibly a gallbladder or cystic duct remnant. No surrounding inflammation. Since 07/22/2023, development of moderate intra and extrahepatic biliary duct dilatation. Common duct dilatation is followed to the level of a 1.7 cm soft tissue density within the distal common duct including on coronal image 67. Pancreas: Normal, without mass or ductal dilatation. Spleen: Normal in size, without focal abnormality. Adrenals/Urinary Tract: Mild right adrenal thickening. Interpolar right renal 3.3 cm cyst. Bilateral too small to characterize renal lesions are most likely cysts . In the absence of clinically indicated signs/symptoms require(s) no independent follow-up. No hydronephrosis. Apparent mild bladder wall thickening is favored to be due to underdistention. Stomach/Bowel: Normal stomach, without wall thickening. Extensive colonic diverticulosis. Normal terminal ileum and appendix. Normal small bowel. Vascular/Lymphatic: Advanced aortic and branch vessel atherosclerosis. No abdominopelvic adenopathy. Reproductive: Mild prostatomegaly. Other: No significant free fluid. Small bilateral fat containing inguinal hernias. Musculoskeletal: No acute osseous  abnormality. IMPRESSION: 1. Interval development, since 07/22/2023, of moderate biliary duct dilatation secondary to soft tissue density within the distal common duct. Most likely noncalcified choledocholithiasis. A soft tissue neoplasm such as extrahepatic cholangiocarcinoma could look similar but is felt less likely. Recommend further evaluation with ERCP. 2. Right hepatic lobe hypoattenuating lesion is indeterminate. Correlate with any history of primary malignancy. Consider further evaluation with outpatient, nonemergent pre and post contrast abdominal MRI. 3. Cholecystectomy. Hypoattenuation in the region of the operative bed could represent focal hepatic steatosis and/or a gallbladder/cystic duct remnant. No surrounding inflammation to suggest acute clinical significance. 4. Incidental findings, including: Coronary artery atherosclerosis. Aortic Atherosclerosis (ICD10-I70.0). Mitral and aortic valvular calcifications for which echocardiography should be considered. Prostatomegaly. Electronically Signed   By: Jeronimo Greaves M.D.   On: 08/26/2023 21:04    Microbiology: Results for orders placed or performed during the hospital encounter of 08/26/23  Resp panel by RT-PCR (RSV, Flu A&B, Covid) Anterior Nasal Swab     Status: None   Collection Time: 08/26/23  4:24 PM   Specimen: Anterior Nasal Swab  Result Value Ref Range Status   SARS Coronavirus 2 by RT PCR NEGATIVE NEGATIVE Final    Comment: (NOTE) SARS-CoV-2 target nucleic acids are NOT DETECTED.  The SARS-CoV-2 RNA is generally detectable in upper respiratory specimens during the acute phase of infection. The lowest concentration of SARS-CoV-2 viral copies this assay can detect is 138 copies/mL. A negative result does not preclude SARS-Cov-2 infection  and should not be used as the sole basis for treatment or other patient management decisions. A negative result may occur with  improper specimen collection/handling, submission of specimen  other than nasopharyngeal swab, presence of viral mutation(s) within the areas targeted by this assay, and inadequate number of viral copies(<138 copies/mL). A negative result must be combined with clinical observations, patient history, and epidemiological information. The expected result is Negative.  Fact Sheet for Patients:  BloggerCourse.com  Fact Sheet for Healthcare Providers:  SeriousBroker.it  This test is no t yet approved or cleared by the [place] FDA and  has been authorized for detection and/or diagnosis of SARS-CoV-2 by FDA under an Emergency Use Authorization (EUA). This EUA will remain  in effect (meaning this test can be used) for the duration of the COVID-19 declaration under Section 564(b)(1) of the Act, 21 U.S.C.section 360bbb-3(b)(1), unless the authorization is terminated  or revoked sooner.       Influenza A by PCR NEGATIVE NEGATIVE Final   Influenza B by PCR NEGATIVE NEGATIVE Final    Comment: (NOTE) The Xpert Xpress SARS-CoV-2/FLU/RSV plus assay is intended as an aid in the diagnosis of influenza from Nasopharyngeal swab specimens and should not be used as a sole basis for treatment. Nasal washings and aspirates are unacceptable for Xpert Xpress SARS-CoV-2/FLU/RSV testing.  Fact Sheet for Patients: BloggerCourse.com  Fact Sheet for Healthcare Providers: SeriousBroker.it  This test is not yet approved or cleared by the [place] FDA and has been authorized for detection and/or diagnosis of SARS-CoV-2 by FDA under an Emergency Use Authorization (EUA). This EUA will remain in effect (meaning this test can be used) for the duration of the COVID-19 declaration under Section 564(b)(1) of the Act, 21 U.S.C. section 360bbb-3(b)(1), unless the authorization is terminated or revoked.     Resp Syncytial Virus by PCR NEGATIVE NEGATIVE Final     Comment: (NOTE) Fact Sheet for Patients: BloggerCourse.com  Fact Sheet for Healthcare Providers: SeriousBroker.it  This test is not yet approved or cleared by the [place] FDA and has been authorized for detection and/or diagnosis of SARS-CoV-2 by FDA under an Emergency Use Authorization (EUA). This EUA will remain in effect (meaning this test can be used) for the duration of the COVID-19 declaration under Section 564(b)(1) of the Act, 21 U.S.C. section 360bbb-3(b)(1), unless the authorization is terminated or revoked.  Performed at [facility], [address] [place], [place] [address]   Culture, blood (routine x 2)     Status: Abnormal   Collection Time: 08/26/23  7:30 PM   Specimen: BLOOD LEFT ARM  Result Value Ref Range Status   Specimen Description   Final    BLOOD LEFT ARM Performed at [facility], [address] [place], [place] [address]    Special Requests   Final    BOTTLES DRAWN AEROBIC AND ANAEROBIC Blood Culture adequate volume Performed at [facility], [address] [place], [place] [address]    Culture  Setup Time   Final    GRAM NEGATIVE RODS IN BOTH AEROBIC AND ANAEROBIC BOTTLES CRITICAL RESULT CALLED TO, READ BACK BY AND VERIFIED WITH: Nathanial Millman [facility] [id number] AT 4132 BY EC Performed at [facility], [address] [place], [place] [address]    Culture ESCHERICHIA COLI (A)  Final   Report Status 08/29/2023 FINAL  Final   Organism ID, Bacteria ESCHERICHIA COLI  Final   Organism ID, Bacteria ESCHERICHIA COLI  Final  Susceptibility   Escherichia coli - KIRBY BAUER*    CEFAZOLIN SENSITIVE Sensitive    Escherichia coli - MIC*    AMPICILLIN 8 SENSITIVE Sensitive     CEFEPIME <=0.12 SENSITIVE Sensitive     CEFTAZIDIME <=1 SENSITIVE Sensitive     CEFTRIAXONE <=0.25 SENSITIVE Sensitive     CIPROFLOXACIN <=0.25 SENSITIVE Sensitive      GENTAMICIN <=1 SENSITIVE Sensitive     IMIPENEM <=0.25 SENSITIVE Sensitive     TRIMETH/SULFA <=20 SENSITIVE Sensitive     AMPICILLIN/SULBACTAM 4 SENSITIVE Sensitive     PIP/TAZO <=4 SENSITIVE Sensitive ug/mL    * ESCHERICHIA COLI    ESCHERICHIA COLI  Blood Culture ID Panel (Reflexed)     Status: Abnormal   Collection Time: 08/26/23  7:30 PM  Result Value Ref Range Status   Enterococcus faecalis NOT DETECTED NOT DETECTED Final   Enterococcus Faecium NOT DETECTED NOT DETECTED Final   Listeria monocytogenes NOT DETECTED NOT DETECTED Final   Staphylococcus species NOT DETECTED NOT DETECTED Final   Staphylococcus aureus (BCID) NOT DETECTED NOT DETECTED Final   Staphylococcus epidermidis NOT DETECTED NOT DETECTED Final   Staphylococcus lugdunensis NOT DETECTED NOT DETECTED Final   Streptococcus species NOT DETECTED NOT DETECTED Final   Streptococcus agalactiae NOT DETECTED NOT DETECTED Final   Streptococcus pneumoniae NOT DETECTED NOT DETECTED Final   Streptococcus pyogenes NOT DETECTED NOT DETECTED Final   A.calcoaceticus-baumannii NOT DETECTED NOT DETECTED Final   Bacteroides fragilis NOT DETECTED NOT DETECTED Final   Enterobacterales DETECTED (A) NOT DETECTED Final    Comment: Enterobacterales represent a large order of gram negative bacteria, not a single organism. CRITICAL RESULT CALLED TO, READ BACK BY AND VERIFIED WITH: PHARMD DREW WOFFORD [id number] AT 0939 BY EC    Enterobacter cloacae complex NOT DETECTED NOT DETECTED Final   Escherichia coli DETECTED (A) NOT DETECTED Final    Comment: CRITICAL RESULT CALLED TO, READ BACK BY AND VERIFIED WITH: PHARMD DREW WOFFORD [id number] AT 0939 BY EC    Klebsiella aerogenes NOT DETECTED NOT DETECTED Final   Klebsiella oxytoca NOT DETECTED NOT DETECTED Final   Klebsiella pneumoniae NOT DETECTED NOT DETECTED Final   Proteus species NOT DETECTED NOT DETECTED Final   Salmonella species NOT DETECTED NOT DETECTED Final   Serratia marcescens NOT  DETECTED NOT DETECTED Final   Haemophilus influenzae NOT DETECTED NOT DETECTED Final   Neisseria meningitidis NOT DETECTED NOT DETECTED Final   Pseudomonas aeruginosa NOT DETECTED NOT DETECTED Final   Stenotrophomonas maltophilia NOT DETECTED NOT DETECTED Final   Candida albicans NOT DETECTED NOT DETECTED Final   Candida auris NOT DETECTED NOT DETECTED Final   Candida glabrata NOT DETECTED NOT DETECTED Final   Candida krusei NOT DETECTED NOT DETECTED Final   Candida parapsilosis NOT DETECTED NOT DETECTED Final   Candida tropicalis NOT DETECTED NOT DETECTED Final   Cryptococcus neoformans/gattii NOT DETECTED NOT DETECTED Final   CTX-M ESBL NOT DETECTED NOT DETECTED Final   Carbapenem resistance IMP NOT DETECTED NOT DETECTED Final   Carbapenem resistance KPC NOT DETECTED NOT DETECTED Final   Carbapenem resistance NDM NOT DETECTED NOT DETECTED Final   Carbapenem resist OXA 48 LIKE NOT DETECTED NOT DETECTED Final   Carbapenem resistance VIM NOT DETECTED NOT DETECTED Final    Comment: Performed at [facility], [address], [place], [place] [address]  Culture, blood (routine x 2)     Status: Abnormal   Collection Time: 08/26/23  7:35 PM   Specimen: BLOOD RIGHT ARM  Result Value Ref Range Status   Specimen Description   Final    BLOOD RIGHT ARM Performed at [facility], [address] [place], [place] [address]    Special Requests   Final    BOTTLES DRAWN AEROBIC AND ANAEROBIC Blood Culture adequate volume Performed at [facility], [address] [place], [place] [address]    Culture  Setup Time   Final    GRAM NEGATIVE RODS IN BOTH AEROBIC AND ANAEROBIC BOTTLES CRITICAL VALUE NOTED.  VALUE IS CONSISTENT WITH PREVIOUSLY REPORTED AND CALLED VALUE.    Culture (A)  Final    ESCHERICHIA COLI SUSCEPTIBILITIES PERFORMED ON PREVIOUS CULTURE WITHIN THE LAST 5 DAYS. Performed at [facility], [address] [place], [place] [address]    Report  Status 08/29/2023 FINAL  Final  MRSA Next Gen by PCR, Nasal     Status: None   Collection Time: 08/27/23  6:50 PM   Specimen: Nasal Mucosa; Nasal Swab  Result Value Ref Range Status   MRSA by PCR Next Gen NOT DETECTED NOT DETECTED Final    Comment: (NOTE) The GeneXpert MRSA Assay (FDA approved for NASAL specimens only), is one component of a comprehensive MRSA colonization surveillance program. It is not intended to diagnose MRSA infection nor to guide or monitor treatment for MRSA infections. Test performance is not FDA approved in patients less than 18 years old. Performed at [facility], [address] [place], [place] [address]     Labs: CBC: Recent Labs  Lab 08/26/23 1626 08/27/23 0551 08/28/23 0359 08/29/23 0356 08/30/23 0427  WBC 10.5 11.8* 9.1 7.2 5.9  NEUTROABS  --   --  7.0 3.9 2.6  HGB 12.7* 10.5* 10.7* 9.9* 9.8*  HCT 40.0 34.2* 34.4* 31.7* 31.9*  MCV 91.7 95.3 94.8 95.2 94.1  PLT 255 183 204 207 227   Basic Metabolic Panel: Recent Labs  Lab 08/26/23 1626 08/27/23 0551 08/28/23 0359 08/29/23 0356 08/30/23 0427  NA 134* 139 137 138 138  K 3.7 3.6 4.4 4.2 3.6  CL 101 106 105 104 103  CO2 20* 25 23 26 26   GLUCOSE 187* 142* 245* 225* 187*  BUN 11 17 26* 21 12  CREATININE 0.65 1.01 1.05 1.03 0.97  CALCIUM 9.0 8.4* 8.4* 8.4* 8.2*   Liver Function Tests: Recent Labs  Lab 08/26/23 1626 08/27/23 0551 08/28/23 0359 08/29/23 0356 08/30/23 0427  AST 476* 252* 97* 35 33  ALT 489* 317* 217* 138* 106*  ALKPHOS 550* 390* 335* 252* 226*  BILITOT 5.8* 5.6* 2.0* 1.4* 1.2  PROT 7.3 5.8* 5.6* 5.5* 5.5*  ALBUMIN 3.6 2.8* 2.7* 2.7* 2.8*   CBG: Recent Labs  Lab 08/29/23 1139 08/29/23 1706 08/29/23 2041 08/30/23 0737 08/30/23 1135  GLUCAP 277* 102* 141* 152* 108*    Discharge time spent: greater than 30 minutes.  Signed: Briant Cedar, MD [facility] 08/30/2023

## 2023-08-30 NOTE — Progress Notes (Signed)
 Interventional Radiology Brief Update  I saw the patient today for planned liver lesion biopsy in ultrasound.  The mass is ill-defined, just subjacent to the dome of the liver, too close to the pleura for safe biopsy. I performed contrast enhanced ultrasound which was also indeterminate with enhancement characteristics similar to MRI: mild enhancement similar to background liver without definite washout.    I believe the risks of biopsy today far outweigh the benefits given likely etiology of possible small hepatic abscess or other benign liver mass such as hemangioma.  I discussed this with the patient.  I recommend consideration of repeating MRI abdomen with contrast in 2-3 months to assess for any changes, pending the rest of his current inpatient course.  PET CT could be considered, however if the finding represents an infectious etiology such, PET CT may be false positive.  He is amenable to this plan.  Marliss Coots, MD Pager: [phone]

## 2023-08-30 NOTE — Sedation Documentation (Signed)
 Procedure aborted per Dr Elby Showers.

## 2023-08-30 NOTE — Progress Notes (Cosign Needed Addendum)
 [facility] Progress Note  CC:  Ascending cholangitis s/p ERCP   Subjective:  Patient was at IR for liver biopsy.  Labs look improved.  Patient not see today.  Will follow-up tomorrow.   Objective:  Vital signs in last 24 hours: Temp:  [98.1 F (36.7 C)-98.9 F (37.2 C)] 98.9 F (37.2 C) (03/04 0515) Pulse Rate:  [64-65] 64 (03/04 0515) Resp:  [16-18] 17 (03/04 0515) BP: (141-147)/(71-75) 147/75 (03/04 0515) SpO2:  [98 %-99 %] 98 % (03/04 0515) Last BM Date : 08/30/23  Intake/Output from previous day: 03/03 0701 - 03/04 0700 In: 300 [IV Piggyback:300] Out: 200 [Urine:200]  Lab Results: Recent Labs    08/28/23 0359 08/29/23 0356 08/30/23 0427  WBC 9.1 7.2 5.9  HGB 10.7* 9.9* 9.8*  HCT 34.4* 31.7* 31.9*  PLT 204 207 227   BMET Recent Labs    08/28/23 0359 08/29/23 0356 08/30/23 0427  NA 137 138 138  K 4.4 4.2 3.6  CL 105 104 103  CO2 23 26 26   GLUCOSE 245* 225* 187*  BUN 26* 21 12  CREATININE 1.05 1.03 0.97  CALCIUM 8.4* 8.4* 8.2*   LFT Recent Labs    08/30/23 0427  PROT 5.5*  ALBUMIN 2.8*  AST 33  ALT 106*  ALKPHOS 226*  BILITOT 1.2   MR LIVER W WO CONTRAST Result Date: 08/28/2023 CLINICAL DATA:  Characterize liver lesion identified by prior CT EXAM: MRI ABDOMEN WITHOUT AND WITH CONTRAST TECHNIQUE: Multiplanar multisequence MR imaging of the abdomen was performed both before and after the administration of intravenous contrast. CONTRAST:  8mL GADAVIST GADOBUTROL 1 MMOL/ML IV SOLN COMPARISON:  CT abdomen pelvis, 08/26/2023 FINDINGS: Lower chest: No acute abnormality. Hepatobiliary: Intrinsically T1 hypointense, heterogeneously T2 hyperintense lesion in the posterior right lobe of the liver measuring 2.8 x 2.2 cm somewhat serpiginous internal structure and ill-defined margins (series 4, image 16). Progressive internal contrast enhancement on multiphasic sequences without a characteristically benign enhancement pattern. Status post  cholecystectomy. Unchanged biliary ductal dilatation, with postprocedural pneumobilia but without residual calculus. Common bile duct measures up to 1.0 cm in caliber (series 3, image 19). Status post cholecystectomy with a probable small gallbladder remnant and surgical material in the gallbladder fossa. Pancreas: Unremarkable. No pancreatic ductal dilatation or surrounding inflammatory changes. Spleen: Normal in size without significant abnormality. Adrenals/Urinary Tract: Adrenal glands are unremarkable. Kidneys are normal, without renal calculi, solid lesion, or hydronephrosis. Stomach/Bowel: Stomach is within normal limits. Periampullary duodenal diverticulum. No evidence of bowel wall thickening, distention, or inflammatory changes. Vascular/Lymphatic: Aortic atherosclerosis. No enlarged abdominal lymph nodes. Other: No abdominal wall hernia or abnormality. No ascites. Musculoskeletal: No acute or significant osseous findings. IMPRESSION: 1. Lesion in the posterior right lobe of the liver measuring 2.8 x 2.2 cm with unusual appearance and progressive internal contrast enhancement on multiphasic sequences without a characteristically benign enhancement pattern. Given lack of clearly benign features, this is highly suspicious for malignancy or metastasis. Consider PET-CT for metabolic characterization and tissue sampling. 2. No other suspicious liver lesions. No other evidence of malignancy or metastatic disease in the abdomen. 3. Status post cholecystectomy. Unchanged biliary ductal dilatation, with postprocedural pneumobilia but without residual calculus. Common bile duct measures up to 1.0 cm in caliber. Aortic Atherosclerosis (ICD10-I70.0). Electronically Signed   By: Jearld Lesch M.D.   On: 08/28/2023 21:25   Assessment / Plan: Ascending cholangitis  2.5 cm liver lesion s/p ERCP with extension of biliary sphincterotomy, stone extraction by Dr. Marina Goodell 3/1  LFTs improving CA 19-9 518 (drawn same day as  ERCP) AFP <1.8 and CEA 1.2 MRI liver shows 2.8 x 2.2 cm liver lesion suspicious for malignancy.   Reassuringly LFTs are improving and likely will continue to improve s/p biliary sphincterotomy.  However, MRI does show a suspicious liver lesion.  CA 19-9 possibly elevated due to recent obstruction since it was drawn the same day as ERCP .  Other tumor markers are normal.  Although CT imaging for liver lesion is concerning for malignancy, with the patient presenting with biliary obstruction could still consider abscess/infectious process as etiology for liver lesion but unknown until definitive biopsies.  Is at IR for biopsy currently.  **Addendum:  I was contacting by staff saying that the patient and wife were under the impression that he was being discharged after his liver biopsy.  He is stable from GI standpoint for discharge today as long as ok with IR.     LOS: 4 days   Princella Pellegrini. Deeanne Deininger  08/30/2023, 12:31 PM

## 2023-08-31 ENCOUNTER — Telehealth: Payer: Self-pay | Admitting: *Deleted

## 2023-08-31 NOTE — Transitions of Care (Post Inpatient/ED Visit) (Signed)
   08/31/2023  Name: Troy Boyer MRN: [id number] DOB: [birth year]  Today's TOC FU Call Status: Today's TOC FU Call Status:: Unsuccessful Call (1st Attempt) Unsuccessful Call (1st Attempt) Date: 08/31/23  Attempted to reach the patient regarding the most recent Inpatient visit; left HIPAA compliant voice message requesting call back  Follow Up Plan: Additional outreach attempts will be made to reach the patient to complete the Transitions of Care (Post Inpatient visit) call.   Pls call/ message for questions,  Caryl Pina, RN, BSN, CCRN Alumnus RN Care Manager  Transitions of Care  [facility] - [facility] [phone]: direct office

## 2023-09-01 ENCOUNTER — Telehealth: Payer: Self-pay | Admitting: *Deleted

## 2023-09-01 NOTE — Transitions of Care (Post Inpatient/ED Visit) (Signed)
 09/01/2023  Name: Troy Boyer MRN: [id number] DOB: 1950/07/14  Today's TOC FU Call Status: Today's TOC FU Call Status:: Successful TOC FU Call Completed TOC FU Call Complete Date: 09/01/23 Patient's Name and Date of Birth confirmed.  Transition Care Management Follow-up Telephone Call Date of Discharge: 08/30/23 Discharge Facility: [facility]) Type of Discharge: Inpatient Admission Primary Inpatient Discharge Diagnosis:: biliary obstruction/ transaminitis How have you been since you were released from the hospital?: Better ("I am fine-- back to my normal self- went out driving today and did fine- as soon as they removed the obstruction, I fel immediately better-- now I am just waiting for the upcoming liver biopsy; I don't need weekly calls-- I am very independent") Any questions or concerns?: No  Items Reviewed: Did you receive and understand the discharge instructions provided?: Yes (thoroughly reviewed with patient who verbalizes good understanding of same) Medications obtained,verified, and reconciled?: Yes (Medications Reviewed) (Full medication reconciliation/ review completed; no concerns or discrepancies identified; confirmed patient obtained/ is taking all newly Rx'd medications as instructed; self-manages medications and denies questions/ concerns around medications today) Any new allergies since your discharge?: No Dietary orders reviewed?: Yes Type of Diet Ordered:: "Low fat" Do you have support at home?: Yes People in Home: spouse Name of Support/Comfort Primary Source: Reports independent in self-care activities; supportive spouse assists as/ if needed/ indicated  Medications Reviewed Today: Medications Reviewed Today     Reviewed by Michaela Corner, RN (Registered Nurse) on 09/01/23 at 1620  Med List Status: <None>   Medication Order Taking? Sig Documenting Provider Last Dose Status Informant  Accu-Chek FastClix Lancets MISC [id number] Yes CHECK BLOOD SUGAR ONCE  OR  TWICE WEEKLY Sharlene Dory, DO Taking Active Self  amLODipine (NORVASC) 5 MG tablet [id number] Yes TAKE 1 TABLET BY MOUTH DAILY Sharlene Dory, DO Taking Active Self  amoxicillin-clavulanate (AUGMENTIN) 875-125 MG tablet [id number] Yes Take 1 tablet by mouth 2 (two) times daily for 5 days. Briant Cedar, MD Taking Active   atorvastatin (LIPITOR) 80 MG tablet [id number] No TAKE 1 TABLET BY MOUTH AT  BEDTIME  Patient not taking: Reported on 09/01/2023   Sharlene Dory, DO Not Taking Active Self           Med Note Jonnie Kind Sep 01, 2023  4:13 PM) 09/01/23- reports during [facility] call, he is currently holding this medication as instructed  clomiPHENE (CLOMID) 50 MG tablet [id number] Yes Take 50 mg by mouth daily. 09/01/23- Reports during TOC call, taking since 2024- prescribed by urology provider Sharlene Dory, DO Taking Active Self  Continuous Glucose Sensor (FREESTYLE LIBRE 3 SENSOR) [place] [id number] No Use to check blood sugar  Patient not taking: Reported on 09/01/2023   Sharlene Dory, DO Not Taking Active Self  ezetimibe (ZETIA) 10 MG tablet [id number] Yes Take 1 tablet by mouth once daily Lewayne Bunting, MD Taking Active Self  fenofibrate (TRICOR) 48 MG tablet [id number] Yes TAKE 1 TABLET BY MOUTH DAILY Sharlene Dory, DO Taking Active Self           Med Note Jonnie Kind Sep 01, 2023  4:14 PM) 09/01/23- reports during [facility] call, he is currently holding this medication as instructed   glucose blood (ACCU-CHEK GUIDE) test strip [id number] Yes Use daily to check blood sugar.  DX E11.65 Sharlene Dory, DO Taking Active Self  Krill Oil 500 MG CAPS [id number] Yes Take 500 mg by mouth  2 (two) times daily. [provider] Taking Active Self  metFORMIN (GLUCOPHAGE-XR) 500 MG 24 hr tablet [id number] Yes Take 2 tablets (1,000 mg total) by mouth daily with breakfast. Sharlene Dory, DO Taking Active Self   metoprolol tartrate (LOPRESSOR) 25 MG tablet [id number] Yes Take 1 tablet (25 mg total) by mouth 2 (two) times daily.  Patient taking differently: Take 25 mg by mouth every evening.   Azalee Course, [place] Taking Active Self  Multiple Vitamin (MULTIVITAMIN WITH MINERALS) TABS tablet [id number] Yes Take 1 tablet by mouth daily. [provider] Taking Active Self  MYRBETRIQ 50 MG TB24 tablet [id number] Yes Take 50 mg by mouth daily. [provider] Taking Active Self  nitroGLYCERIN (NITROSTAT) 0.4 MG SL tablet [id number] Yes Place 1 tablet (0.4 mg total) under the tongue every 5 (five) minutes as needed for chest pain. Sharlene Dory, DO Taking Active Self  omeprazole (PRILOSEC) 40 MG capsule [id number] No Take 1 capsule (40 mg total) by mouth daily before breakfast.  Patient not taking: Reported on 09/01/2023   Benancio Deeds, MD Not Taking Active Self  ondansetron (ZOFRAN-ODT) 4 MG disintegrating tablet [id number] No Take 4 mg by mouth every 8 (eight) hours as needed for nausea or vomiting.  Patient not taking: Reported on 09/01/2023   [provider] Not Taking Active Self  pioglitazone (ACTOS) 45 MG tablet [id number] Yes Take 1 tablet (45 mg total) by mouth daily. Sharlene Dory, DO Taking Active Self  ramipril (ALTACE) 10 MG capsule [id number] Yes TAKE 1 CAPSULE BY MOUTH TWICE  DAILY Crenshaw, Madolyn Frieze, MD Taking Active Self  tadalafil (CIALIS) 5 MG tablet [id number] Yes Take 1 tablet (5 mg total) by mouth daily.  Patient taking differently: Take 5 mg by mouth daily as needed for erectile dysfunction.   Sharlene Dory, DO Taking Active Self  traZODone (DESYREL) 50 MG tablet [id number] Yes TAKE 1 TABLET BY MOUTH AT  BEDTIME AS NEEDED FOR SLEEP Sharlene Dory, DO Taking Active Self  valACYclovir (VALTREX) 1000 MG tablet [id number] Yes Take 1 tablet (1,000 mg total) by mouth daily for 3 days. Briant Cedar, MD Taking Active             Home Care and Equipment/Supplies: Were Home Health Services Ordered?: No Any new equipment or medical supplies ordered?: No  Functional Questionnaire: Do you need assistance with bathing/showering or dressing?: No Do you need assistance with meal preparation?: No Do you need assistance with eating?: No Do you have difficulty maintaining continence: No Do you need assistance with getting out of bed/getting out of a chair/moving?: No Do you have difficulty managing or taking your medications?: No  Follow up appointments reviewed: PCP Follow-up appointment confirmed?: Yes Date of PCP follow-up appointment?: 09/02/23 Follow-up Provider: PCP- Dr. Carmelia Roller Specialist [facility] Follow-up appointment confirmed?: Yes Date of Specialist follow-up appointment?: 10/12/23 Follow-Up Specialty Provider:: GI provider Do you need transportation to your follow-up appointment?: No Do you understand care options if your condition(s) worsen?: Yes-patient verbalized understanding  SDOH Interventions Today    Flowsheet Row Most Recent Value  SDOH Interventions   Food Insecurity Interventions Intervention Not Indicated  Housing Interventions Intervention Not Indicated  Transportation Interventions Intervention Not Indicated  [drives self]  Utilities Interventions Intervention Not Indicated      Interventions Today    Flowsheet Row Most Recent Value  Chronic Disease   Chronic disease during today's visit Other  [biliary obstruction with ERCP]  General Interventions   General  Interventions Discussed/Reviewed General Interventions Discussed, Durable Medical Equipment (DME), Doctor Visits  [confirmed not currently requiring/ using assistive devices for ambulation]  Doctor Visits Discussed/Reviewed PCP, Specialist, Doctor Visits Discussed  Durable Medical Equipment (DME) Other  PCP/Specialist Visits Compliance with follow-up visit  Nutrition Interventions   Nutrition Discussed/Reviewed Nutrition  Discussed  Pharmacy Interventions   Pharmacy Dicussed/Reviewed Pharmacy Topics Discussed  [Full medication review with updating medication list in EHR per patient report]      TOC Interventions Today    Flowsheet Row Most Recent Value  TOC Interventions   TOC Interventions Discussed/Reviewed TOC Interventions Discussed  [Patient declines need for ongoing/ further care management outreach,  declines enrollment in 30-day TOC program,  provided my direct contact information should questions/ concerns/ needs arise post-TOC call]      Total time spent from review to signing of note/ including any care coordination interventions:  39 minutes  Pls call/ message for questions,  Caryl Pina, RN, BSN, Media planner  Transitions of Care  [facility] - [place] [facility] [phone]: direct office

## 2023-09-02 ENCOUNTER — Encounter: Payer: Self-pay | Admitting: Family Medicine

## 2023-09-02 ENCOUNTER — Other Ambulatory Visit: Payer: Self-pay | Admitting: Family Medicine

## 2023-09-02 ENCOUNTER — Ambulatory Visit: Admitting: Family Medicine

## 2023-09-02 VITALS — BP 128/78 | HR 62 | Temp 97.8°F | Resp 18 | Ht 64.0 in | Wt 176.0 lb

## 2023-09-02 DIAGNOSIS — K8050 Calculus of bile duct without cholangitis or cholecystitis without obstruction: Secondary | ICD-10-CM

## 2023-09-02 LAB — COMPREHENSIVE METABOLIC PANEL
ALT: 64 U/L — ABNORMAL HIGH (ref 0–53)
AST: 22 U/L (ref 0–37)
Albumin: 3.9 g/dL (ref 3.5–5.2)
Alkaline Phosphatase: 206 U/L — ABNORMAL HIGH (ref 39–117)
BUN: 10 mg/dL (ref 6–23)
CO2: 28 meq/L (ref 19–32)
Calcium: 8.9 mg/dL (ref 8.4–10.5)
Chloride: 106 meq/L (ref 96–112)
Creatinine, Ser: 0.86 mg/dL (ref 0.40–1.50)
GFR: 87.16 mL/min (ref >60.00)
Glucose, Bld: 144 mg/dL — ABNORMAL HIGH (ref 70–99)
Potassium: 4.4 meq/L (ref 3.5–5.1)
Sodium: 142 meq/L (ref 135–145)
Total Bilirubin: 1 mg/dL (ref 0.2–1.2)
Total Protein: 6.2 g/dL (ref 6.0–8.3)

## 2023-09-02 LAB — CBC
HCT: 36.5 % — ABNORMAL LOW (ref 39.0–52.0)
Hemoglobin: 11.8 g/dL — ABNORMAL LOW (ref 13.0–17.0)
MCHC: 32.5 g/dL (ref 30.0–36.0)
MCV: 91.9 fl (ref 78.0–100.0)
Platelets: 254 10*3/uL (ref 150.0–400.0)
RBC: 3.97 Mil/uL — ABNORMAL LOW (ref 4.22–5.81)
RDW: 16.2 % — ABNORMAL HIGH (ref 11.5–15.5)
WBC: 6.7 10*3/uL (ref 4.0–10.5)

## 2023-09-02 NOTE — Progress Notes (Signed)
 Chief Complaint  Patient presents with   Hospitalization Follow-up    Patient presents today for a hospital follow-up. He was admitted into [facility] from 08/26/2023- 08/30/2023 for Biliary Obstruction.    Subjective: Patient is a 73 y.o. male here for hospital follow-up.  [facility] and discharged on 08/30/2023.  Patient found to have recurrent choledocholithiasis.  He had an ERCP with stone removal and reports resolution of his symptoms.  No nausea, vomiting, fevers, or bleeding.  He is currently on a 5-day course of Augmentin.  His stools are little loose from this but he is tolerating well otherwise.  Reports compliance.  Has a follow-up appointment with GI in around 6 weeks.  Past Medical History:  Diagnosis Date   Adenomatous colon polyp    tubular   CAD (coronary artery disease)    a. Cath 06/08/01 at [facility] with normal LM and LAD, 95% LCx s/p Circumflex stent 4.0 x 15 mm Penta and 85% and 80% prox RCA s/p 3.5 x 23 mm Penta b. cath 01/20/2015 95% prox LCx ISR treated with DES, 55% prox to mid RCA, 45% distal RCA, 40% midLAD, EF 55%     Cataract    Diabetes mellitus without complication (HCC)    Diarrhea    Diverticulosis    Erectile dysfunction    Gallstones    Hyperlipidemia    Hypertension    Myocardial infarction (HCC)     Objective: BP 128/78   Pulse 62   Temp 97.8 F (36.6 C)   Resp 18   Ht 5\' 4"  (1.626 m)   Wt 176 lb (79.8 kg)   SpO2 98%   BMI 30.21 kg/m  General: Awake, appears stated age Heart: RRR Lungs: CTAB, no rales, wheezes or rhonchi. No accessory muscle use Abdomen: Bowel sounds present, soft, nontender, nondistended Psych: Age appropriate judgment and insight, normal affect and mood  Assessment and Plan: Choledocholithiasis - Plan: CBC, Comprehensive metabolic panel  Check above labs.  Consider a probiotic while on the antibiotic.  Consider a fiber supplement on the antibiotic.  Stay  hydrated.  Follow-up as originally scheduled. The patient voiced understanding and agreement to the plan.  Jilda Roche [place], DO 09/02/23  12:19 PM

## 2023-09-02 NOTE — Patient Instructions (Addendum)
 Give [place] 2-3 business days to get the results of your labs back.   Take Metamucil or Benefiber daily.  Eat a yogurt with probiotics daily.   Let us know if you need anything.

## 2023-09-03 ENCOUNTER — Encounter: Payer: Self-pay | Admitting: Family Medicine

## 2023-09-04 LAB — CULTURE, BLOOD (ROUTINE X 2)
Culture: NO GROWTH
Culture: NO GROWTH
Special Requests: ADEQUATE

## 2023-09-07 ENCOUNTER — Other Ambulatory Visit: Payer: Self-pay | Admitting: Family Medicine

## 2023-09-08 NOTE — Telephone Encounter (Signed)
 Patient did go to the hospital, was admitted and since discharged.

## 2023-09-14 DIAGNOSIS — E119 Type 2 diabetes mellitus without complications: Secondary | ICD-10-CM | POA: Diagnosis not present

## 2023-09-14 DIAGNOSIS — H43811 Vitreous degeneration, right eye: Secondary | ICD-10-CM | POA: Diagnosis not present

## 2023-09-14 LAB — HM DIABETES EYE EXAM

## 2023-09-16 ENCOUNTER — Other Ambulatory Visit (INDEPENDENT_AMBULATORY_CARE_PROVIDER_SITE_OTHER)

## 2023-09-16 ENCOUNTER — Encounter: Payer: Self-pay | Admitting: Family Medicine

## 2023-09-16 DIAGNOSIS — R7401 Elevation of levels of liver transaminase levels: Secondary | ICD-10-CM | POA: Diagnosis not present

## 2023-09-16 DIAGNOSIS — R748 Abnormal levels of other serum enzymes: Secondary | ICD-10-CM | POA: Diagnosis not present

## 2023-09-16 DIAGNOSIS — R7989 Other specified abnormal findings of blood chemistry: Secondary | ICD-10-CM | POA: Diagnosis not present

## 2023-09-16 LAB — HEPATIC FUNCTION PANEL
ALT: 16 U/L (ref 0–53)
AST: 15 U/L (ref 0–37)
Albumin: 4.2 g/dL (ref 3.5–5.2)
Alkaline Phosphatase: 72 U/L (ref 39–117)
Bilirubin, Direct: 0.2 mg/dL (ref 0.0–0.3)
Total Bilirubin: 0.6 mg/dL (ref 0.2–1.2)
Total Protein: 6.4 g/dL (ref 6.0–8.3)

## 2023-09-16 LAB — LIPASE: Lipase: 37 U/L (ref 11.0–59.0)

## 2023-09-16 LAB — CBC
HCT: 38.9 % — ABNORMAL LOW (ref 39.0–52.0)
Hemoglobin: 12.6 g/dL — ABNORMAL LOW (ref 13.0–17.0)
MCHC: 32.3 g/dL (ref 30.0–36.0)
MCV: 91.7 fl (ref 78.0–100.0)
Platelets: 269 10*3/uL (ref 150.0–400.0)
RBC: 4.24 Mil/uL (ref 4.22–5.81)
RDW: 16.7 % — ABNORMAL HIGH (ref 11.5–15.5)
WBC: 5.1 10*3/uL (ref 4.0–10.5)

## 2023-09-22 LAB — HEPATITIS PANEL, ACUTE
Hep A IgM: NONREACTIVE
Hep B C IgM: NONREACTIVE
Hepatitis B Surface Ag: NONREACTIVE
Hepatitis C Ab: NONREACTIVE

## 2023-09-22 LAB — IGG: IgG (Immunoglobin G), Serum: 649 mg/dL (ref 600–1540)

## 2023-09-22 LAB — ANTI-SMOOTH MUSCLE ANTIBODY, IGG: Actin (Smooth Muscle) Antibody (IGG): <20 U (ref <20)

## 2023-09-22 LAB — ANA: Anti Nuclear Antibody (ANA): NEGATIVE

## 2023-09-26 ENCOUNTER — Ambulatory Visit: Payer: Self-pay

## 2023-09-26 NOTE — Telephone Encounter (Signed)
 Chief Complaint: increased confusion/behavior Symptoms: increased confusion and behavior Frequency: a few weeks Pertinent Negatives: Patient denies violent behavior Disposition: [] ED /[] Urgent Care (no appt availability in office) / [x] Appointment(In office/virtual)/ []  [facility]/ [] Home Care/ [] Refused Recommended Disposition /[] [facility]/ [x]  Follow-up with PCP Additional Notes: Patients wife called in states that the patient is having increased confusion and she is concerned he has dementia. Stats that this morning he had a incident where he was using the restroom and he got off the toilet with just his robe on and came to the door with robe open stating that the weather was going to get bad and that he was still having the bleeding with his bowel movements. She states that he then proceeded to walk through the house with stool dripping and his robe open. States that she ask him and he said that he was having a bowel movement when he got up.  She states that he did not wipe and continued to track stool through the house with robe open and their daughter stays with them. States she is concerned about his behavior as it is getting worse and he get upset when she talks about it.  If you need to reach out please contact the wife on her phone [phone]    Copied From CRM [id number]. Reason for Triage: Patient significant other called requesting to speak to nurse regarding possible dementia, says she's concerned and patient is in denial and gets angry when mentioned     Reason for Disposition  [1] Longstanding confusion (e.g., dementia, stroke) AND [2] NO worsening or change  Answer Assessment - Initial Assessment Questions 1. MAIN CONCERN OR SYMPTOM:  "What is your main concern right now?" "What questions do you have?" "What's the main symptom you're worried about?" (e.g., confusion, memory loss)     confusion 2. ONSET:  "When did the symptom start (or worsen)?" (minutes,  hours, days, weeks)     Over last couple 3. BETTER-SAME-WORSE: "Are you (the patient) getting better, staying the same, or getting worse compared to the day you (they) were diagnosed or most recent hospital discharge ?"     worse 4. DIAGNOSIS: "Was the dementia diagnosed by a doctor?" If Yes, ask: "When?" (e.g., days, months, years ago)     no 5. MEDICINES: "Has there been any change in medicines recently?" (e.g., narcotics, antihistamines, benzodiazepines, etc.)     no 6. OTHER SYMPTOMS: "Are there any other symptoms?" (e.g., fever, cough, pain, falling)     no 7. SUPPORT: Document living circumstances and support (e.g., family, nursing home)     Lives with wife and daughter  Protocols used: Dementia Symptoms and Questions-A-AH, Confusion - Delirium-A-AH

## 2023-09-27 NOTE — Telephone Encounter (Signed)
 Can be discussed at appt on 10/05/23

## 2023-09-30 ENCOUNTER — Other Ambulatory Visit: Payer: Self-pay | Admitting: Family Medicine

## 2023-09-30 ENCOUNTER — Other Ambulatory Visit: Payer: Self-pay

## 2023-09-30 DIAGNOSIS — E782 Mixed hyperlipidemia: Secondary | ICD-10-CM

## 2023-09-30 DIAGNOSIS — I10 Essential (primary) hypertension: Secondary | ICD-10-CM

## 2023-09-30 NOTE — Telephone Encounter (Signed)
 Patient was scheduled on 11/11/23 @ 10:45 AM and add on labs ordered.

## 2023-10-03 ENCOUNTER — Other Ambulatory Visit: Payer: Self-pay | Admitting: Physician Assistant

## 2023-10-03 ENCOUNTER — Other Ambulatory Visit: Payer: Self-pay | Admitting: Family Medicine

## 2023-10-04 ENCOUNTER — Encounter: Payer: Self-pay | Admitting: Family Medicine

## 2023-10-05 ENCOUNTER — Ambulatory Visit: Admitting: Urology

## 2023-10-05 ENCOUNTER — Encounter: Payer: Self-pay | Admitting: Family Medicine

## 2023-10-05 ENCOUNTER — Encounter: Payer: Self-pay | Admitting: Urology

## 2023-10-05 ENCOUNTER — Ambulatory Visit (INDEPENDENT_AMBULATORY_CARE_PROVIDER_SITE_OTHER): Admitting: Family Medicine

## 2023-10-05 VITALS — BP 138/70 | HR 77 | Ht 64.0 in | Wt 177.6 lb

## 2023-10-05 VITALS — BP 142/87 | HR 66 | Ht 64.0 in | Wt 172.0 lb

## 2023-10-05 DIAGNOSIS — N401 Enlarged prostate with lower urinary tract symptoms: Secondary | ICD-10-CM | POA: Diagnosis not present

## 2023-10-05 DIAGNOSIS — E291 Testicular hypofunction: Secondary | ICD-10-CM | POA: Diagnosis not present

## 2023-10-05 DIAGNOSIS — G3184 Mild cognitive impairment, so stated: Secondary | ICD-10-CM | POA: Diagnosis not present

## 2023-10-05 DIAGNOSIS — N529 Male erectile dysfunction, unspecified: Secondary | ICD-10-CM | POA: Insufficient documentation

## 2023-10-05 DIAGNOSIS — N138 Other obstructive and reflux uropathy: Secondary | ICD-10-CM | POA: Diagnosis not present

## 2023-10-05 LAB — URINALYSIS, ROUTINE W REFLEX MICROSCOPIC
Bilirubin, UA: NEGATIVE
Glucose, UA: NEGATIVE
Ketones, UA: NEGATIVE
Leukocytes,UA: NEGATIVE
Nitrite, UA: NEGATIVE
Protein,UA: NEGATIVE
RBC, UA: NEGATIVE
Specific Gravity, UA: 1.01 (ref 1.005–1.030)
Urobilinogen, Ur: 0.2 mg/dL (ref 0.2–1.0)
pH, UA: 5.5 (ref 5.0–7.5)

## 2023-10-05 LAB — BLADDER SCAN AMB NON-IMAGING

## 2023-10-05 MED ORDER — TESTOSTERONE 20.25 MG/1.25GM (1.62%) TD GEL: 4.0000 | Freq: Every day | TRANSDERMAL | 5 refills | Status: DC

## 2023-10-05 MED ORDER — GEMTESA 75 MG PO TABS: 75.0000 mg | ORAL_TABLET | Freq: Every day | ORAL | 0 refills | Status: DC

## 2023-10-05 NOTE — Progress Notes (Signed)
 Chief Complaint  Patient presents with   Acute Visit    Patient presents today for confusion.    Subjective: Patient is a 73 y.o. male here for confusion. Here w wife.   Patient has had worsening memory over the past year.  His mood seems to be worsening as well.  He was found to have low testosterone and Clomid seem to help with his mood but insurance no longer covers this.  Diet could be better and he does not exercise routinely.  He is able to do activities of daily living without significant issues or interruptions.  He does still drive.  1 day, he wanted to show his wife some blood in his stool and he got up and stool was dripping from under his rope as he did not wipe (he is seeing the gastroenterology team next week and has been following with them).  He was under the impression this was totally normal.  Acts like these are frustrating his wife.  He is not on any medicine for dementia or neurocognitive disorder.  Past Medical History:  Diagnosis Date   Adenomatous colon polyp    tubular   CAD (coronary artery disease)    a. Cath 06/08/01 at [facility] with normal LM and LAD, 95% LCx s/p Circumflex stent 4.0 x 15 mm Penta and 85% and 80% prox RCA s/p 3.5 x 23 mm Penta b. cath 01/20/2015 95% prox LCx ISR treated with DES, 55% prox to mid RCA, 45% distal RCA, 40% midLAD, EF 55%     Cataract    Diabetes mellitus without complication (HCC)    Diarrhea    Diverticulosis    Erectile dysfunction    Gallstones    Hyperlipidemia    Hypertension    Myocardial infarction (HCC)     Objective: BP 138/70   Pulse 77   Ht 5\' 4"  (1.626 m)   Wt 177 lb 9.6 oz (80.6 kg)   SpO2 96%   BMI 30.48 kg/m  General: Awake, appears stated age Neuro: No cerebellar signs, speech is fluent and goal oriented, alert and oriented to person, place, year, month, and president.  He thought it was the fourth when in fact it is in the ninth. Lungs: No accessory muscle use Psych: Limited judgment and insight,  normal affect and mood  Assessment and Plan: Mild neurocognitive disorder - Plan: CBC, Comprehensive metabolic panel with GFR, TSH, Q65, RPR, HIV Antibody (routine testing w rflx), Ambulatory referral to Neurology  Chronic issue that seems to be worsening.  We did discuss medication which has a side effect of loose stool/diarrhea.  This would worsen something that is already a problem with him.  Will check the basic labs as above and refer him to the neurology team for further evaluation.  I do want him to stay physically active and try to eat cleanly.  Doing mentally stimulating activities like puzzles or word searches also recommended.  Reading. The patient and his spouse voiced understanding and agreement to the plan.  I spent 31 minutes with the patient and his spouse discussing the above plan in addition to reviewing his chart on the same date of the visit.  Jilda Roche [place], DO 10/05/23  3:09 PM

## 2023-10-05 NOTE — Progress Notes (Signed)
 Assessment: 1. BPH with obstruction/lower urinary tract symptoms   2. Organic impotence   3. Hypogonadism in male     Plan: I personally reviewed the patient's chart including provider notes, and lab results. Continue tadalafil 5 mg daily. Trial of Gemtesa 75 mg daily in place of Myrbetriq.  Samples provided.  Use and side effects discussed.  I discussed options for management of hypogonadism including short acting injections, oral medication, topical therapy, long-acting injections, and subcutaneous pellets. Begin Androgel 1.62% 4 pumps/day  D/C Clomid  Return to office in 1 month  Chief Complaint:  Chief Complaint  Patient presents with   Benign Prostatic Hypertrophy    History of Present Illness:  Troy Boyer is a 73 y.o. male who is seen for evaluation of BPH with lower urinary tract symptoms, hypogonadism, and erectile dysfunction. He has been followed for these urologic issues at [facility] by Dr. Liliane Shi.  His last visit was in February 2025.  BPH with LUTS: He has recently been managed with tadalafil 5 mg daily and Myrbetriq 50 mg daily.  He reported his urinary symptoms were stable and his visit on 08/18/2023. PSA from 2/25: 3.39 He reports that the Myrbetriq does not seem to be controlling his urgency and urge incontinence.  He is having frequency voiding every 2 hours and nocturia x 3.  He is requiring the use of adult briefs for his incontinence.  No dysuria or gross hematuria. IPSS = 17/5.  ED: He has been managed with tadalafil 5 mg daily with good results. He continues to have satisfactory results with the daily tadalafil.  Hypogonadism: He was previously on Clomid but this was discontinued due to cost of the medication and lack of insurance coverage for the medication.  He noted a return of his hypogonadal symptoms after stopping the Clomid.   Testosterone 2/25: 283.5 He has resumed the Clomid and has noted symptomatic benefit.  He is interested in  alternative therapies for management of his hypergonadism.  Past Medical History:  Past Medical History:  Diagnosis Date   Adenomatous colon polyp    tubular   CAD (coronary artery disease)    a. Cath 06/08/01 at [facility] with normal LM and LAD, 95% LCx s/p Circumflex stent 4.0 x 15 mm Penta and 85% and 80% prox RCA s/p 3.5 x 23 mm Penta b. cath 01/20/2015 95% prox LCx ISR treated with DES, 55% prox to mid RCA, 45% distal RCA, 40% midLAD, EF 55%     Cataract    Diabetes mellitus without complication (HCC)    Diarrhea    Diverticulosis    Erectile dysfunction    Gallstones    Hyperlipidemia    Hypertension    Myocardial infarction [facility])     Past Surgical History:  Past Surgical History:  Procedure Laterality Date   ANGIOPLASTY  2002   2 stents   BALLOON DILATION N/A 08/27/2023   Procedure: BALLOON DILATION;  Surgeon: Hilarie Fredrickson, MD;  Location: [facility];  Service: Gastroenterology;  Laterality: N/A;   CARDIAC CATHETERIZATION N/A 01/20/2015   Procedure: Left Heart Cath and Coronary Angiography;  Surgeon: Lyn Records, MD;  Location: [facility] INVASIVE CV LAB;  Service: Cardiovascular;  Laterality: N/A;   CARDIAC CATHETERIZATION N/A 01/27/2016   Procedure: Left Heart Cath and Coronary Angiography;  Surgeon: Tonny Bollman, MD;  Location: [facility] INVASIVE CV LAB;  Service: Cardiovascular;  Laterality: N/A;   CHOLECYSTECTOMY N/A 02/09/2016   Procedure: LAPAROSCOPIC CHOLECYSTECTOMY WITH INTRAOPERATIVE CHOLANGIOGRAM;  Surgeon:  Harriette Bouillon, MD;  Location: [facility] OR;  Service: General;  Laterality: N/A;   COLONOSCOPY W/ POLYPECTOMY  08/2013   Avg risk screening, Dr Walker Kehr. 5 mm tubular adenoma ascending.  Mild to moderated descending and sigmoid tics.  Internal hemorrhoids   CORONARY ANGIOPLASTY WITH STENT PLACEMENT     ERCP N/A 02/13/2016   Procedure: ENDOSCOPIC RETROGRADE CHOLANGIOPANCREATOGRAPHY (ERCP);  Surgeon: Sherrilyn Rist, MD;  Location: [facility] ENDOSCOPY;  Service: Gastroenterology;   Laterality: N/A;   ERCP N/A 08/27/2023   Procedure: ENDOSCOPIC RETROGRADE CHOLANGIOPANCREATOGRAPHY (ERCP);  Surgeon: Hilarie Fredrickson, MD;  Location: [facility];  Service: Gastroenterology;  Laterality: N/A;   REMOVAL OF STONES  08/27/2023   Procedure: REMOVAL OF STONES;  Surgeon: Hilarie Fredrickson, MD;  Location: [facility];  Service: Gastroenterology;;   ROTATOR CUFF REPAIR Right 2009   ROTATOR CUFF REPAIR Left 2014   SPHINCTEROTOMY  08/27/2023   Procedure: SPHINCTEROTOMY;  Surgeon: Hilarie Fredrickson, MD;  Location: [facility] ENDOSCOPY;  Service: Gastroenterology;;   TRIGGER FINGER RELEASE Bilateral 2013   4 fingers, middle finger on both hands    Allergies:  Allergies  Allergen Reactions   Other Rash    ECG electrodes, per patient   Protonix [Pantoprazole] Other (See Comments)    Exacerbated reflux s/s's    Family History:  Family History  Problem Relation Age of Onset   Dementia Mother    Crohn's disease Mother    Heart disease Father 48       Valve replacement and CAD, CABG   Diabetes Father    CAD Sister 16   CAD Brother 63       Died age 39   Diabetes Brother    Kidney disease Brother    Colon cancer Neg Hx    Esophageal cancer Neg Hx    Stomach cancer Neg Hx    Rectal cancer Neg Hx    Pancreatic cancer Neg Hx     Social History:  Social History   Tobacco Use   Smoking status: Former    Current packs/day: 0.00    Average packs/day: 1 pack/day for 45.0 years (45.0 ttl pk-yrs)    Types: Cigarettes    Start date: 01/16/1970    Quit date: 01/17/2015    Years since quitting: 8.7   Smokeless tobacco: Never  Vaping Use   Vaping status: Never Used  Substance Use Topics   Alcohol use: Not Currently    Alcohol/week: 1.0 standard drink of alcohol    Types: 1 Glasses of wine per week    Comment: Previously 1 large vodka tonic per day Quit 06/2023   Drug use: No    Review of symptoms:  Constitutional:  Negative for unexplained weight loss, night sweats, fever, chills ENT:   Negative for nose bleeds, sinus pain, painful swallowing CV:  Negative for chest pain, shortness of breath, exercise intolerance, palpitations, loss of consciousness Resp:  Negative for cough, wheezing, shortness of breath GI:  Negative for nausea, vomiting, diarrhea, bloody stools GU:  Positives noted in HPI; otherwise negative for gross hematuria, dysuria Neuro:  Negative for seizures, poor balance, limb weakness, slurred speech Psych:  Negative for lack of energy, depression, anxiety Endocrine:  Negative for polydipsia, polyuria, symptoms of hypoglycemia (dizziness, hunger, sweating) Hematologic:  Negative for anemia, purpura, petechia, prolonged or excessive bleeding, use of anticoagulants  Allergic:  Negative for difficulty breathing or choking as a result of exposure to anything; no shellfish allergy; no allergic response (rash/itch) to materials,  foods  Physical exam: BP (!) 142/87   Pulse 66   Ht 5\' 4"  (1.626 m)   Wt 172 lb (78 kg)   BMI 29.52 kg/m  GENERAL APPEARANCE:  Well appearing, well developed, well nourished, NAD HEENT: Atraumatic, Normocephalic, oropharynx clear. NECK: Supple without lymphadenopathy or thyromegaly. LUNGS: Clear to auscultation bilaterally. HEART: Regular Rate and Rhythm without murmurs, gallops, or rubs. ABDOMEN: Soft, non-tender, No Masses. EXTREMITIES: Moves all extremities well.  Without clubbing, cyanosis, or edema. NEUROLOGIC:  Alert and oriented x 3, normal gait, CN II-XII grossly intact.  MENTAL STATUS:  Appropriate. BACK:  Non-tender to palpation.  No CVAT SKIN:  Warm, dry and intact.    Results: U/A:  negative  PVR = 143 ml

## 2023-10-06 ENCOUNTER — Encounter: Payer: Self-pay | Admitting: Family Medicine

## 2023-10-06 LAB — COMPREHENSIVE METABOLIC PANEL WITH GFR
ALT: 15 U/L (ref 0–53)
AST: 15 U/L (ref 0–37)
Albumin: 4.5 g/dL (ref 3.5–5.2)
Alkaline Phosphatase: 46 U/L (ref 39–117)
BUN: 14 mg/dL (ref 6–23)
CO2: 30 meq/L (ref 19–32)
Calcium: 9.6 mg/dL (ref 8.4–10.5)
Chloride: 102 meq/L (ref 96–112)
Creatinine, Ser: 0.94 mg/dL (ref 0.40–1.50)
GFR: 81.54 mL/min (ref >60.00)
Glucose, Bld: 169 mg/dL — ABNORMAL HIGH (ref 70–99)
Potassium: 4.1 meq/L (ref 3.5–5.1)
Sodium: 141 meq/L (ref 135–145)
Total Bilirubin: 0.4 mg/dL (ref 0.2–1.2)
Total Protein: 6.6 g/dL (ref 6.0–8.3)

## 2023-10-06 LAB — TSH: TSH: 1.15 u[IU]/mL (ref 0.35–5.50)

## 2023-10-06 LAB — CBC
HCT: 40.9 % (ref 39.0–52.0)
Hemoglobin: 13.3 g/dL (ref 13.0–17.0)
MCHC: 32.6 g/dL (ref 30.0–36.0)
MCV: 92 fl (ref 78.0–100.0)
Platelets: 241 10*3/uL (ref 150.0–400.0)
RBC: 4.44 Mil/uL (ref 4.22–5.81)
RDW: 16.3 % — ABNORMAL HIGH (ref 11.5–15.5)
WBC: 6.7 10*3/uL (ref 4.0–10.5)

## 2023-10-06 LAB — HIV ANTIBODY (ROUTINE TESTING W REFLEX): HIV 1&2 Ab, 4th Generation: NONREACTIVE

## 2023-10-06 LAB — RPR: RPR Ser Ql: NONREACTIVE

## 2023-10-06 LAB — VITAMIN B12: Vitamin B-12: 393 pg/mL (ref 211–911)

## 2023-10-11 NOTE — Progress Notes (Unsigned)
 Chief Complaint: Hospital follow-up Primary GI MD: Dr. Adela Lank  HPI: 73 year old male with medical history as listed below presents for hospital follow-up  Seen by Dr. Adela Lank 08/24/2023 with RUQ pain, poor appetite, weight loss of 24 pounds, fever, chills.  S/p cholecystectomy in 2017 and found to have choledocholithiasis at that time and underwent sphincterotomy and stone removal.  Patient had worsening symptoms after office visit and proceeded to ED in which she was admitted for transaminitis with initial Labs show WBC 10.5/hemoglobin 12.7/hematocrit 40.0/platelets 255 Sodium 134/potassium 3.7/BUN 11/creatinine 0.65 T. bili 5.8/alk phos 550/AST 476/ALT 489  CT abdomen pelvis showed development of moderate intra and extrahepatic biliary ductal dilation with common bile duct dilated to the level of a 1.7 cm soft tissue density within the distal common bile duct There is a right posterior hepatic lobe hypoattenuating lesion 2.5 x 2.3 cm Rule out choledocholithiasis versus cholangiocarcinoma.  MRI liver shows 2.8 x 2.2 cm liver lesion suspicious for malignancy CA 19-9 518 (drawn same day as ERCP) AFP <1.8 and CEA 1.2.   Underwent ERCP with extension of biliary sphincterotomy and stone extraction by Dr. Marina Goodell 3/1 and subsequent improvement in LFTs.  IR was unable to perform liver biopsy due to mass too close to the pleural for safe biopsy and recommended repeat MRI in 2 to 3 months  Discussed the use of AI scribe software for clinical note transcription with the patient, who gave verbal consent to proceed.  History of Present Illness      PREVIOUS GI WORKUP   ERCP 08/27/2023 1. Choledocholithiasis status post ERC with biliary sphincterotomy, balloon sphincteroplasty, and common duct stone extraction  2. Remote cholecystectomy  3. Cholangitis  4. Indeterminate liver lesion. Rule out abscess ( most likely) or neoplasm.  EGD 01/2021 for dysphagia - Esophagogastric landmarks  identified.  - 1 cm hiatal hernia.  - LA Grade A esophagitis.  - Benign- appearing esophageal stenosis. Dilated to 18mm with good results.  - A single plaque in the lower third of the esophagus. Biopsied.  - Normal stomach.  - Normal duodenal bulb and second portion of the duodenum.  Colonoscopy 01/2021 for history of colon polyps - The examined portion of the ileum was normal.  - One 2 to 3 mm polyp in the ascending colon, removed with a cold biopsy forceps. Resectedand retrieved.  - Diverticulosis in the entire examined colon.  - Internal hemorrhoids with stigmata of prior banding.  - The examination was otherwise normal.  - Biopsies were taken with a cold forceps from the right colon, left colon and transverse colon for evaluation of microscopic colitis. - Repeat 7 years (2029)  Diagnosis 1. Surgical [P], lower esophagus bx - ESOPHAGEAL SQUAMOUS MUCOSA WITH MILD VASCULAR CONGESTION, AND FOCAL SQUAMOUS BALLOONING, SUGGESTIVE OF REFLUX ESOPHAGITIS - NEGATIVE FOR INCREASED INTRAEPITHELIAL EOSINOPHILS 2. Surgical [P], colon nos, random sites - COLONIC MUCOSA WITH NO SPECIFIC HISTOPATHOLOGIC CHANGES - NEGATIVE FOR ACUTE INFLAMMATION, INCREASED INTRAEPITHELIAL LYMPHOCYTES OR THICKENED SUBEPITHELIAL COLLAGEN TABLE 3. Surgical [P], colon, ascending polyp x1, polyp (1) - TUBULAR ADENOMA - NEGATIVE FOR HIGH-GRADE DYSPLASIA OR MALIGNANCY  Past Medical History:  Diagnosis Date   Adenomatous colon polyp    tubular   CAD (coronary artery disease)    a. Cath 06/08/01 at [facility] with normal LM and LAD, 95% LCx s/p Circumflex stent 4.0 x 15 mm Penta and 85% and 80% prox RCA s/p 3.5 x 23 mm Penta b. cath 01/20/2015 95% prox LCx ISR treated with DES, 55% prox  to mid RCA, 45% distal RCA, 40% midLAD, EF 55%     Cataract    Diabetes mellitus without complication (HCC)    Diarrhea    Diverticulosis    Erectile dysfunction    Gallstones    Hyperlipidemia    Hypertension    Myocardial  infarction [facility])     Past Surgical History:  Procedure Laterality Date   ANGIOPLASTY  2002   2 stents   BALLOON DILATION N/A 08/27/2023   Procedure: BALLOON DILATION;  Surgeon: Tobin Forts, MD;  Location: [facility];  Service: Gastroenterology;  Laterality: N/A;   CARDIAC CATHETERIZATION N/A 01/20/2015   Procedure: Left Heart Cath and Coronary Angiography;  Surgeon: Arty Binning, MD;  Location: [facility] INVASIVE CV LAB;  Service: Cardiovascular;  Laterality: N/A;   CARDIAC CATHETERIZATION N/A 01/27/2016   Procedure: Left Heart Cath and Coronary Angiography;  Surgeon: Arnoldo Lapping, MD;  Location: [facility] INVASIVE CV LAB;  Service: Cardiovascular;  Laterality: N/A;   CHOLECYSTECTOMY N/A 02/09/2016   Procedure: LAPAROSCOPIC CHOLECYSTECTOMY WITH INTRAOPERATIVE CHOLANGIOGRAM;  Surgeon: Sim Dryer, MD;  Location[facility] OR;  Service: General;  Laterality: N/A;   COLONOSCOPY W/ POLYPECTOMY  08/2013   Avg risk screening, Dr Christianna Cowman. 5 mm tubular adenoma ascending.  Mild to moderated descending and sigmoid tics.  Internal hemorrhoids   CORONARY ANGIOPLASTY WITH STENT PLACEMENT     ERCP N/A 02/13/2016   Procedure: ENDOSCOPIC RETROGRADE CHOLANGIOPANCREATOGRAPHY (ERCP);  Surgeon: Albertina Hugger, MD;  Location: [facility] ENDOSCOPY;  Service: Gastroenterology;  Laterality: N/A;   ERCP N/A 08/27/2023   Procedure: ENDOSCOPIC RETROGRADE CHOLANGIOPANCREATOGRAPHY (ERCP);  Surgeon: Tobin Forts, MD;  Location: [facility];  Service: Gastroenterology;  Laterality: N/A;   REMOVAL OF STONES  08/27/2023   Procedure: REMOVAL OF STONES;  Surgeon: Tobin Forts, MD;  Location: [facility] ENDOSCOPY;  Service: Gastroenterology;;   ROTATOR CUFF REPAIR Right 2009   ROTATOR CUFF REPAIR Left 2014   SPHINCTEROTOMY  08/27/2023   Procedure: SPHINCTEROTOMY;  Surgeon: Tobin Forts, MD;  Location: [facility] ENDOSCOPY;  Service: Gastroenterology;;   TRIGGER FINGER RELEASE Bilateral 2013   4 fingers, middle finger on both hands    Current Outpatient  Medications  Medication Sig Dispense Refill   Accu-Chek FastClix Lancets MISC CHECK BLOOD SUGAR ONCE OR  TWICE WEEKLY 102 each 3   amLODipine (NORVASC) 5 MG tablet TAKE 1 TABLET BY MOUTH DAILY 90 tablet 3   atorvastatin (LIPITOR) 80 MG tablet TAKE 1 TABLET BY MOUTH AT  BEDTIME 90 tablet 3   clomiPHENE (CLOMID) 50 MG tablet Take 50 mg by mouth daily. 09/01/23- Reports during TOC call, taking since 2024- prescribed by urology provider (Patient not taking: Reported on 10/05/2023)     Continuous Glucose Sensor (FREESTYLE LIBRE 3 SENSOR) MISC Use to check blood sugar 1 each 1   ezetimibe (ZETIA) 10 MG tablet Take 1 tablet by mouth once daily 90 tablet 1   fenofibrate (TRICOR) 48 MG tablet Take 1 tablet (48 mg total) by mouth daily. 90 tablet 0   glucose blood (ACCU-CHEK GUIDE) test strip Use daily to check blood sugar.  DX E11.65 100 each 3   Krill Oil 500 MG CAPS Take 500 mg by mouth 2 (two) times daily.     metFORMIN (GLUCOPHAGE-XR) 500 MG 24 hr tablet Take 2 tablets (1,000 mg total) by mouth daily with breakfast.     metoprolol tartrate (LOPRESSOR) 25 MG tablet Take 1 tablet (25 mg total) by mouth 2 (two) times daily.  KEEP OV. 180 tablet 0   Multiple Vitamin (MULTIVITAMIN WITH MINERALS) TABS tablet Take 1 tablet by mouth daily.     nitroGLYCERIN (NITROSTAT) 0.4 MG SL tablet Place 1 tablet (0.4 mg total) under the tongue every 5 (five) minutes as needed for chest pain. 25 tablet 3   ondansetron (ZOFRAN-ODT) 4 MG disintegrating tablet Take 4 mg by mouth every 8 (eight) hours as needed for nausea or vomiting.     pioglitazone (ACTOS) 45 MG tablet Take 1 tablet (45 mg total) by mouth daily. 90 tablet 3   ramipril (ALTACE) 10 MG capsule TAKE 1 CAPSULE BY MOUTH TWICE  DAILY 180 capsule 3   tadalafil (CIALIS) 5 MG tablet Take 1 tablet (5 mg total) by mouth daily. (Patient taking differently: Take 5 mg by mouth daily as needed for erectile dysfunction.) 30 tablet 11   Testosterone (ANDROGEL) 20.25 MG/1.25GM  (1.62%) GEL Apply 4 Pump topically daily. 150 g 5   traZODone (DESYREL) 50 MG tablet TAKE 1 TABLET BY MOUTH AT  BEDTIME AS NEEDED FOR SLEEP 90 tablet 3   Vibegron (GEMTESA) 75 MG TABS Take 1 tablet (75 mg total) by mouth daily. 28 tablet 0   No current facility-administered medications for this visit.    Allergies as of 10/12/2023 - Review Complete 10/05/2023  Allergen Reaction Noted   Other Rash 08/30/2023   Protonix [pantoprazole] Other (See Comments) 08/23/2023    Family History  Problem Relation Age of Onset   Dementia Mother    Crohn's disease Mother    Heart disease Father 52       Valve replacement and CAD, CABG   Diabetes Father    CAD Sister 4   CAD Brother 60       Died age 38   Diabetes Brother    Kidney disease Brother    Colon cancer Neg Hx    Esophageal cancer Neg Hx    Stomach cancer Neg Hx    Rectal cancer Neg Hx    Pancreatic cancer Neg Hx     Social History   Socioeconomic History   Marital status: Significant Other    Spouse name: Not on file   Number of children: 2   Years of education: 18   Highest education level: Bachelor's degree (e.g., BA, AB, BS)  Occupational History   Occupation: Airline pilot- retired in 2018    Employer: [employer]  Tobacco Use   Smoking status: Former    Current packs/day: 0.00    Average packs/day: 1 pack/day for 45.0 years (45.0 ttl pk-yrs)    Types: Cigarettes    Start date: 01/16/1970    Quit date: 01/17/2015    Years since quitting: 8.7   Smokeless tobacco: Never  Vaping Use   Vaping status: Never Used  Substance and Sexual Activity   Alcohol use: Not Currently    Alcohol/week: 1.0 standard drink of alcohol    Types: 1 Glasses of wine per week    Comment: Previously 1 large vodka tonic per day Quit 06/2023   Drug use: No   Sexual activity: Yes  Other Topics Concern   Not on file  Social History Narrative   Fun: Boat, golf   Denies religious beliefs effecting health care.    Social Drivers of Health    Financial Resource Strain: Low Risk  (08/20/2023)   Overall Financial Resource Strain (CARDIA)    Difficulty of Paying Living Expenses: Not hard at all  Food Insecurity: No Food Insecurity (09/01/2023)  Hunger Vital Sign    Worried About Running Out of Food in the Last Year: Never true    Ran Out of Food in the Last Year: Never true  Transportation Needs: No Transportation Needs (09/01/2023)   PRAPARE - Administrator, Civil Service (Medical): No    Lack of Transportation (Non-Medical): No  Physical Activity: Inactive (08/20/2023)   Exercise Vital Sign    Days of Exercise per Week: 0 days    Minutes of Exercise per Session: 90 min  Stress: Stress Concern Present (08/20/2023)   [employer] of Occupational Health - Occupational Stress Questionnaire    Feeling of Stress : To some extent  Social Connections: Socially Integrated (08/27/2023)   Social Connection and Isolation Panel [NHANES]    Frequency of Communication with Friends and Family: Twice a week    Frequency of Social Gatherings with Friends and Family: Once a week    Attends Religious Services: More than 4 times per year    Active Member of [employer] or Organizations: No    Attends Engineer, structural: More than 4 times per year    Marital Status: Living with partner  Intimate Partner Violence: Not At Risk (09/01/2023)   Humiliation, Afraid, Rape, and Kick questionnaire    Fear of Current or Ex-Partner: No    Emotionally Abused: No    Physically Abused: No    Sexually Abused: No    Review of Systems:    Constitutional: No weight loss, fever, chills, weakness or fatigue HEENT: Eyes: No change in vision               Ears, Nose, Throat:  No change in hearing or congestion Skin: No rash or itching Cardiovascular: No chest pain, chest pressure or palpitations   Respiratory: No SOB or cough Gastrointestinal: See HPI and otherwise negative Genitourinary: No dysuria or change in urinary  frequency Neurological: No headache, dizziness or syncope Musculoskeletal: No new muscle or joint pain Hematologic: No bleeding or bruising Psychiatric: No history of depression or anxiety    Physical Exam:  Vital signs: There were no vitals taken for this visit.  Constitutional: NAD, alert and cooperative Head:  Normocephalic and atraumatic. Eyes:   PEERL, EOMI. No icterus. Conjunctiva pink. Respiratory: Respirations even and unlabored. Lungs clear to auscultation bilaterally.   No wheezes, crackles, or rhonchi.  Cardiovascular:  Regular rate and rhythm. No peripheral edema, cyanosis or pallor.  Gastrointestinal:  Soft, nondistended, nontender. No rebound or guarding. Normal bowel sounds. No appreciable masses or hepatomegaly. Rectal:  Declines Msk:  Symmetrical without gross deformities. Without edema, no deformity or joint abnormality.  Neurologic:  Alert and  oriented x4;  grossly normal neurologically.  Skin:   Dry and intact without significant lesions or rashes. Psychiatric: Oriented to person, place and time. Demonstrates good judgement and reason without abnormal affect or behaviors.  Physical Exam    RELEVANT LABS AND IMAGING: CBC    Component Value Date/Time   WBC 6.7 10/05/2023 1412   RBC 4.44 10/05/2023 1412   HGB 13.3 10/05/2023 1412   HCT 40.9 10/05/2023 1412   PLT 241.0 10/05/2023 1412   MCV 92.0 10/05/2023 1412   MCH 28.9 08/30/2023 0427   MCHC 32.6 10/05/2023 1412   RDW 16.3 (H) 10/05/2023 1412   LYMPHSABS 2.4 08/30/2023 0427   MONOABS 0.7 08/30/2023 0427   EOSABS 0.0 08/30/2023 0427   BASOSABS 0.0 08/30/2023 0427    CMP     Component Value Date/Time  NA 141 10/05/2023 1412   NA 141 05/28/2020 1056   K 4.1 10/05/2023 1412   CL 102 10/05/2023 1412   CO2 30 10/05/2023 1412   GLUCOSE 169 (H) 10/05/2023 1412   BUN 14 10/05/2023 1412   BUN 16 05/28/2020 1056   CREATININE 0.94 10/05/2023 1412   CREATININE 0.90 09/18/2014 0927   CALCIUM 9.6  10/05/2023 1412   PROT 6.6 10/05/2023 1412   PROT 6.7 05/28/2020 1056   ALBUMIN 4.5 10/05/2023 1412   ALBUMIN 4.7 05/28/2020 1056   AST 15 10/05/2023 1412   ALT 15 10/05/2023 1412   ALKPHOS 46 10/05/2023 1412   BILITOT 0.4 10/05/2023 1412   BILITOT 0.4 05/28/2020 1056   GFRNONAA >60 08/30/2023 0427   GFRNONAA >89 09/18/2014 0927   GFRAA 75 05/28/2020 1056   GFRAA >89 09/18/2014 0927     Assessment/Plan:   Ascending cholangitis secondary to recurrent CBD stone S/p cholecystectomy with choledocholithiasis undergoing sphincterotomy 2017.  Admitted March 2025 with transaminitis and choledocholithiasis s/p ERCP with biliary sphincterotomy, balloon sphincteroplasty and CBD stone extraction with noted cholangitis.  Resolution of transaminitis on labs 10/05/2023 - Normalization of LFTs  Liver mass MRI with liver lesion in posterior lobe measuring 2.8 x 2.2 suspicious for malignancy. CA 19-9 518 (drawn same day as ERCP) AFP <1.8 and CEA 1.2 IR unable to obtain safe biopsy due to mass being too close to pleura and recommended follow-up imaging in 2 to 3 months Liver lesion suspicious for malignancy versus underlying hepatic abscess in the setting of recent biliary obstruction - Repeat MRI of liver -  History of colon polyps Colonoscopy 2022 with repeat 2029   Suzanna Erp, PA-C [facility] 10/11/2023, 11:54 AM  Cc: Jobe Mulder*

## 2023-10-12 ENCOUNTER — Encounter: Payer: Self-pay | Admitting: Gastroenterology

## 2023-10-12 ENCOUNTER — Ambulatory Visit: Admitting: Gastroenterology

## 2023-10-12 VITALS — BP 120/78 | HR 85 | Ht 64.0 in | Wt 178.0 lb

## 2023-10-12 DIAGNOSIS — R748 Abnormal levels of other serum enzymes: Secondary | ICD-10-CM

## 2023-10-12 DIAGNOSIS — Z860100 Personal history of colon polyps, unspecified: Secondary | ICD-10-CM | POA: Diagnosis not present

## 2023-10-12 DIAGNOSIS — R1011 Right upper quadrant pain: Secondary | ICD-10-CM

## 2023-10-12 DIAGNOSIS — K8309 Other cholangitis: Secondary | ICD-10-CM

## 2023-10-12 DIAGNOSIS — R634 Abnormal weight loss: Secondary | ICD-10-CM

## 2023-10-12 DIAGNOSIS — R160 Hepatomegaly, not elsewhere classified: Secondary | ICD-10-CM | POA: Diagnosis not present

## 2023-10-12 DIAGNOSIS — R932 Abnormal findings on diagnostic imaging of liver and biliary tract: Secondary | ICD-10-CM

## 2023-10-12 NOTE — Progress Notes (Signed)
 Agree with assessment and plan as outlined.

## 2023-10-12 NOTE — Patient Instructions (Signed)
 You have been scheduled for an MRI at [facility] on 11/23/23. Your appointment time is 10:00am. Please arrive to admitting (at main entrance of the hospital) 30 minutes prior to your appointment time for registration purposes. Please make certain not to have anything to eat or drink 6 hours prior to your test. In addition, if you have any metal in your body, have a pacemaker or defibrillator, please be sure to let your ordering physician know. This test typically takes 45 minutes to 1 hour to complete. Should you need to reschedule, please call [phone] to do so.  Follow up as needed if symptoms worsen or increase  Due to recent changes in healthcare laws, you may see the results of your imaging and laboratory studies on MyChart before your provider has had a chance to review them.  We understand that in some cases there may be results that are confusing or concerning to you. Not all laboratory results come back in the same time frame and the provider may be waiting for multiple results in order to interpret others.  Please give us  48 hours in order for your provider to thoroughly review all the results before contacting the office for clarification of your results.   _______________________________________________________  If your blood pressure at your visit was 140/90 or greater, please contact your primary care physician to follow up on this.  _______________________________________________________  If you are age 72 or older, your body mass index should be between 23-30. Your Body mass index is 30.55 kg/m. If this is out of the aforementioned range listed, please consider follow up with your Primary Care Provider.  If you are age 83 or younger, your body mass index should be between 19-25. Your Body mass index is 30.55 kg/m. If this is out of the aformentioned range listed, please consider follow up with your Primary Care Provider.    ________________________________________________________  The [facility] providers would like to encourage you to use MYCHART to communicate with providers for non-urgent requests or questions.  Due to long hold times on the telephone, sending your provider a message by [facility] may be a faster and more efficient way to get a response.  Please allow 48 business hours for a response.  Please remember that this is for non-urgent requests.  _______________________________________________________ Thank you for trusting me with your gastrointestinal care!   Suzanna Erp, PA

## 2023-10-14 ENCOUNTER — Encounter: Payer: Self-pay | Admitting: Cardiology

## 2023-10-19 ENCOUNTER — Other Ambulatory Visit: Payer: Self-pay | Admitting: Family Medicine

## 2023-10-19 DIAGNOSIS — G4700 Insomnia, unspecified: Secondary | ICD-10-CM

## 2023-10-25 ENCOUNTER — Other Ambulatory Visit: Payer: Self-pay

## 2023-10-25 ENCOUNTER — Encounter: Payer: Self-pay | Admitting: Family Medicine

## 2023-10-25 ENCOUNTER — Ambulatory Visit (INDEPENDENT_AMBULATORY_CARE_PROVIDER_SITE_OTHER): Payer: Medicare Other | Admitting: Family Medicine

## 2023-10-25 VITALS — BP 128/74 | HR 69 | Temp 98.0°F | Resp 16 | Ht 64.0 in | Wt 178.0 lb

## 2023-10-25 DIAGNOSIS — E1165 Type 2 diabetes mellitus with hyperglycemia: Secondary | ICD-10-CM

## 2023-10-25 DIAGNOSIS — S46811A Strain of other muscles, fascia and tendons at shoulder and upper arm level, right arm, initial encounter: Secondary | ICD-10-CM

## 2023-10-25 DIAGNOSIS — Z0000 Encounter for general adult medical examination without abnormal findings: Secondary | ICD-10-CM

## 2023-10-25 DIAGNOSIS — Z0001 Encounter for general adult medical examination with abnormal findings: Secondary | ICD-10-CM | POA: Diagnosis not present

## 2023-10-25 DIAGNOSIS — I10 Essential (primary) hypertension: Secondary | ICD-10-CM

## 2023-10-25 LAB — LIPID PANEL
Cholesterol: 104 mg/dL (ref 0–200)
HDL: 47.3 mg/dL (ref >39.00)
LDL Cholesterol: 38 mg/dL (ref 0–99)
NonHDL: 56.22
Total CHOL/HDL Ratio: 2
Triglycerides: 93 mg/dL (ref 0.0–149.0)
VLDL: 18.6 mg/dL (ref 0.0–40.0)

## 2023-10-25 LAB — MICROALBUMIN / CREATININE URINE RATIO
Creatinine,U: 112.2 mg/dL
Microalb Creat Ratio: 7.1 mg/g (ref 0.0–30.0)
Microalb, Ur: 0.8 mg/dL (ref 0.0–1.9)

## 2023-10-25 MED ORDER — FREESTYLE LIBRE 3 SENSOR MISC: 3 refills | Status: DC

## 2023-10-25 NOTE — Patient Instructions (Addendum)
 Give us  2-3 business days to get the results of your labs back.   Keep the diet clean and stay active.  Aim to do some physical exertion for 150 minutes per week. This is typically divided into 5 days per week, 30 minutes per day. The activity should be enough to get your heart rate up. Anything is better than nothing if you have time constraints.  Please get me a copy of your advanced directive form at your convenience.   Heat (pad or rice pillow in microwave) over affected area, 10-15 minutes twice daily.   Ice/cold pack over area for 10-15 min twice daily.  OK to take Tylenol  1000 mg (2 extra strength tabs) or 975 mg (3 regular strength tabs) every 6 hours as needed.  Send me a message in a few weeks if shoulder region is not significantly better.   Let us  know if you need anything.  Trapezius stretches/exercises Do exercises exactly as told by your health care provider and adjust them as directed. It is normal to feel mild stretching, pulling, tightness, or discomfort as you do these exercises, but you should stop right away if you feel sudden pain or your pain gets worse.   Stretching and range of motion exercises These exercises warm up your muscles and joints and improve the movement and flexibility of your shoulder. These exercises can also help to relieve pain, numbness, and tingling. If you are unable to do any of the following for any reason, do not further attempt to do it.   Exercise A: Flexion, standing     Stand and hold a broomstick, a cane, or a similar object. Place your hands a little more than shoulder-width apart on the object. Your left / right hand should be palm-up, and your other hand should be palm-down. Push the stick to raise your left / right arm out to your side and then over your head. Use your other hand to help move the stick. Stop when you feel a stretch in your shoulder, or when you reach the angle that is recommended by your health care provider. Avoid  shrugging your shoulder while you raise your arm. Keep your shoulder blade tucked down toward your spine. Hold for 30 seconds. Slowly return to the starting position. Repeat 2 times. Complete this exercise 3 times per week.  Exercise B: Abduction, supine     Lie on your back and hold a broomstick, a cane, or a similar object. Place your hands a little more than shoulder-width apart on the object. Your left / right hand should be palm-up, and your other hand should be palm-down. Push the stick to raise your left / right arm out to your side and then over your head. Use your other hand to help move the stick. Stop when you feel a stretch in your shoulder, or when you reach the angle that is recommended by your health care provider. Avoid shrugging your shoulder while you raise your arm. Keep your shoulder blade tucked down toward your spine. Hold for 30 seconds. Slowly return to the starting position. Repeat 2 times. Complete this exercise 3 times per week.  Exercise C: Flexion, active-assisted     Lie on your back. You may bend your knees for comfort. Hold a broomstick, a cane, or a similar object. Place your hands about shoulder-width apart on the object. Your palms should face toward your feet. Raise the stick and move your arms over your head and behind your head, toward  the floor. Use your healthy arm to help your left / right arm move farther. Stop when you feel a gentle stretch in your shoulder, or when you reach the angle where your health care provider tells you to stop. Hold for 30 seconds. Slowly return to the starting position. Repeat 2 times. Complete this exercise 3 times per week.  Exercise D: External rotation and abduction     Stand in a door frame with one of your feet slightly in front of the other. This is called a staggered stance. Choose one of the following positions as told by your health care provider: Place your hands and forearms on the door frame above your  head. Place your hands and forearms on the door frame at the height of your head. Place your hands on the door frame at the height of your elbows. Slowly move your weight onto your front foot until you feel a stretch across your chest and in the front of your shoulders. Keep your head and chest upright and keep your abdominal muscles tight. Hold for 30 seconds. To release the stretch, shift your weight to your back foot. Repeat 2 times. Complete this stretch 3 times per week.  Strengthening exercises These exercises build strength and endurance in your shoulder. Endurance is the ability to use your muscles for a long time, even after your muscles get tired. Exercise E: Scapular depression and adduction  Sit on a stable chair. Support your arms in front of you with pillows, armrests, or a tabletop. Keep your elbows in line with the sides of your body. Gently move your shoulder blades down toward your middle back. Relax the muscles on the tops of your shoulders and in the back of your neck. Hold for 3 seconds. Slowly release the tension and relax your muscles completely before doing this exercise again. Repeat for a total of 10 repetitions. After you have practiced this exercise, try doing the exercise without the arm support. Then, try the exercise while standing instead of sitting. Repeat 2 times. Complete this exercise 3 times per week.  Exercise F: Shoulder abduction, isometric     Stand or sit about 4-6 inches (10-15 cm) from a wall with your left / right side facing the wall. Bend your left / right elbow and gently press your elbow against the wall. Increase the pressure slowly until you are pressing as hard as you can without shrugging your shoulder. Hold for 3 seconds. Slowly release the tension and relax your muscles completely. Repeat for a total of 10 repetitions. Repeat 2 times. Complete this exercise 3 times per week.  Exercise G: Shoulder flexion, isometric     Stand or sit  about 4-6 inches (10-15 cm) away from a wall with your left / right side facing the wall. Keep your left / right elbow straight and gently press the top of your fist against the wall. Increase the pressure slowly until you are pressing as hard as you can without shrugging your shoulder. Hold for 10-15 seconds. Slowly release the tension and relax your muscles completely. Repeat for a total of 10 repetitions. Repeat 2 times. Complete this exercise 3 times per week.  Exercise H: Internal rotation     Sit in a stable chair without armrests, or stand. Secure an exercise band at your left / right side, at elbow height. Place a soft object, such as a folded towel or a small pillow, under your left / right upper arm so your elbow is  a few inches (about 8 cm) away from your side. Hold the end of the exercise band so the band stretches. Keeping your elbow pressed against the soft object under your arm, move your forearm across your body toward your abdomen. Keep your body steady so the movement is only coming from your shoulder. Hold for 3 seconds. Slowly return to the starting position. Repeat for a total of 10 repetitions. Repeat 2 times. Complete this exercise 3 times per week.  Exercise I: External rotation     Sit in a stable chair without armrests, or stand. Secure an exercise band at your left / right side, at elbow height. Place a soft object, such as a folded towel or a small pillow, under your left / right upper arm so your elbow is a few inches (about 8 cm) away from your side. Hold the end of the exercise band so the band stretches. Keeping your elbow pressed against the soft object under your arm, move your forearm out, away from your abdomen. Keep your body steady so the movement is only coming from your shoulder. Hold for 3 seconds. Slowly return to the starting position. Repeat for a total of 10 repetitions. Repeat 2 times. Complete this exercise 3 times per week. Exercise J:  Shoulder extension  Sit in a stable chair without armrests, or stand. Secure an exercise band to a stable object in front of you so the band is at shoulder height. Hold one end of the exercise band in each hand. Your palms should face each other. Straighten your elbows and lift your hands up to shoulder height. Step back, away from the secured end of the exercise band, until the band stretches. Squeeze your shoulder blades together and pull your hands down to the sides of your thighs. Stop when your hands are straight down by your sides. Do not let your hands go behind your body. Hold for 3 seconds. Slowly return to the starting position. Repeat for a total of 10 repetitions. Repeat 2 times. Complete this exercise 3 times per week.  Exercise K: Shoulder extension, prone     Lie on your abdomen on a firm surface so your left / right arm hangs over the edge. Hold a 5 lb weight in your hand so your palm faces in toward your body. Your arm should be straight. Squeeze your shoulder blade down toward the middle of your back. Slowly raise your arm behind you, up to the height of the surface that you are lying on. Keep your arm straight. Hold for 3 seconds. Slowly return to the starting position and relax your muscles. Repeat for a total of 10 repetitions. Repeat 2 times. Complete this exercise 3 times per week.   Exercise L: Horizontal abduction, prone  Lie on your abdomen on a firm surface so your left / right arm hangs over the edge. Hold a 5 lb weight in your hand so your palm faces toward your feet. Your arm should be straight. Squeeze your shoulder blade down toward the middle of your back. Bend your elbow so your hand moves up, until your elbow is bent to an "L" shape (90 degrees). With your elbow bent, slowly move your forearm forward and up. Raise your hand up to the height of the surface that you are lying on. Your upper arm should not move, and your elbow should stay bent. At the top of  the movement, your palm should face the floor. Hold for 3 seconds. Slowly return  to the starting position and relax your muscles. Repeat for a total of 10 repetitions. Repeat 2 times. Complete this exercise 3 times per week.  Exercise M: Horizontal abduction, standing  Sit on a stable chair, or stand. Secure an exercise band to a stable object in front of you so the band is at shoulder height. Hold one end of the exercise band in each hand. Straighten your elbows and lift your hands straight in front of you, up to shoulder height. Your palms should face down, toward the floor. Step back, away from the secured end of the exercise band, until the band stretches. Move your arms out to your sides, and keep your arms straight. Hold for 3 seconds. Slowly return to the starting position. Repeat for a total of 10 repetitions. Repeat 2 times. Complete this exercise 3 times per week.  Exercise N: Scapular retraction and elevation  Sit on a stable chair, or stand. Secure an exercise band to a stable object in front of you so the band is at shoulder height. Hold one end of the exercise band in each hand. Your palms should face each other. Sit in a stable chair without armrests, or stand. Step back, away from the secured end of the exercise band, until the band stretches. Squeeze your shoulder blades together and lift your hands over your head. Keep your elbows straight. Hold for 3 seconds. Slowly return to the starting position. Repeat for a total of 10 repetitions. Repeat 2 times. Complete this exercise 3 times per week.  This information is not intended to replace advice given to you by your health care provider. Make sure you discuss any questions you have with your health care provider. Document Released: 06/14/2005 Document Revised: 02/19/2016 Document Reviewed: 05/01/2015 Elsevier Interactive Patient Education  2017 [employer].

## 2023-10-25 NOTE — Progress Notes (Signed)
 Chief Complaint  Patient presents with   Annual Exam    CPE     Well Male Troy Boyer is here for a complete physical.   His last physical was >1 year ago.  Current diet: in general, diet is fair.   Current exercise: none Weight trend: stable Fatigue out of ordinary? No. Seat belt? Yes.   Advanced directive? Yes  Health maintenance Shingrix- Yes Colonoscopy- Yes Tetanus- Yes Hep C- Yes Lung cancer screening- Yes Pneumonia vaccine- Yes  R shoulder pain- started 1 week ago. No inj or change in activity. No radiation, stabbing pain. Lost some ROM of R shoulder 2/2 pain. Tylenol  helps. Has not tried anything else. No bruising, redness, swelling, neuro s/s's.   Past Medical History:  Diagnosis Date   Adenomatous colon polyp    tubular   CAD (coronary artery disease)    a. Cath 06/08/01 at [facility] with normal LM and LAD, 95% LCx s/p Circumflex stent 4.0 x 15 mm Penta and 85% and 80% prox RCA s/p 3.5 x 23 mm Penta b. cath 01/20/2015 95% prox LCx ISR treated with DES, 55% prox to mid RCA, 45% distal RCA, 40% midLAD, EF 55%     Cataract    Diabetes mellitus without complication (HCC)    Diarrhea    Diverticulosis    Erectile dysfunction    Gallstones    Hyperlipidemia    Hypertension    Myocardial infarction [facility])      Past Surgical History:  Procedure Laterality Date   ANGIOPLASTY  2002   2 stents   BALLOON DILATION N/A 08/27/2023   Procedure: BALLOON DILATION;  Surgeon: Tobin Forts, MD;  Location: [facility];  Service: Gastroenterology;  Laterality: N/A;   CARDIAC CATHETERIZATION N/A 01/20/2015   Procedure: Left Heart Cath and Coronary Angiography;  Surgeon: Arty Binning, MD;  Location: [facility] INVASIVE CV LAB;  Service: Cardiovascular;  Laterality: N/A;   CARDIAC CATHETERIZATION N/A 01/27/2016   Procedure: Left Heart Cath and Coronary Angiography;  Surgeon: Arnoldo Lapping, MD;  Location: [facility] INVASIVE CV LAB;  Service: Cardiovascular;  Laterality: N/A;   CHOLECYSTECTOMY  N/A 02/09/2016   Procedure: LAPAROSCOPIC CHOLECYSTECTOMY WITH INTRAOPERATIVE CHOLANGIOGRAM;  Surgeon: Sim Dryer, MD;  Location: [facility] OR;  Service: General;  Laterality: N/A;   COLONOSCOPY W/ POLYPECTOMY  08/2013   Avg risk screening, Dr Christianna Cowman. 5 mm tubular adenoma ascending.  Mild to moderated descending and sigmoid tics.  Internal hemorrhoids   CORONARY ANGIOPLASTY WITH STENT PLACEMENT     ERCP N/A 02/13/2016   Procedure: ENDOSCOPIC RETROGRADE CHOLANGIOPANCREATOGRAPHY (ERCP);  Surgeon: Albertina Hugger, MD;  Location: [facility] ENDOSCOPY;  Service: Gastroenterology;  Laterality: N/A;   ERCP N/A 08/27/2023   Procedure: ENDOSCOPIC RETROGRADE CHOLANGIOPANCREATOGRAPHY (ERCP);  Surgeon: Tobin Forts, MD;  Location: [facility];  Service: Gastroenterology;  Laterality: N/A;   REMOVAL OF STONES  08/27/2023   Procedure: REMOVAL OF STONES;  Surgeon: Tobin Forts, MD;  Location: [facility];  Service: Gastroenterology;;   ROTATOR CUFF REPAIR Right 2009   ROTATOR CUFF REPAIR Left 2014   SPHINCTEROTOMY  08/27/2023   Procedure: SPHINCTEROTOMY;  Surgeon: Tobin Forts, MD;  Location: [facility] ENDOSCOPY;  Service: Gastroenterology;;   TRIGGER FINGER RELEASE Bilateral 2013   4 fingers, middle finger on both hands    Medications  Current Outpatient Medications on File Prior to Visit  Medication Sig Dispense Refill   Accu-Chek FastClix Lancets MISC CHECK BLOOD SUGAR ONCE OR  TWICE WEEKLY 102 each  3   amLODipine  (NORVASC ) 5 MG tablet TAKE 1 TABLET BY MOUTH DAILY 90 tablet 3   atorvastatin  (LIPITOR ) 80 MG tablet TAKE 1 TABLET BY MOUTH AT  BEDTIME 90 tablet 3   clomiPHENE (CLOMID) 50 MG tablet Take 50 mg by mouth daily. 09/01/23- Reports during TOC call, taking since 2024- prescribed by urology provider     Continuous Glucose Sensor (FREESTYLE LIBRE 3 SENSOR) MISC Use to check blood sugar 1 each 1   ezetimibe  (ZETIA ) 10 MG tablet Take 1 tablet by mouth once daily 90 tablet 1   fenofibrate  (TRICOR ) 48 MG tablet Take  1 tablet (48 mg total) by mouth daily. 90 tablet 0   glucose blood (ACCU-CHEK GUIDE) test strip Use daily to check blood sugar.  DX E11.65 100 each 3   Krill Oil 500 MG CAPS Take 500 mg by mouth 2 (two) times daily.     metFORMIN  (GLUCOPHAGE -XR) 500 MG 24 hr tablet Take 2 tablets (1,000 mg total) by mouth daily with breakfast.     metoprolol  tartrate (LOPRESSOR ) 25 MG tablet Take 1 tablet (25 mg total) by mouth 2 (two) times daily. KEEP OV. 180 tablet 0   Multiple Vitamin (MULTIVITAMIN WITH MINERALS) TABS tablet Take 1 tablet by mouth daily.     nitroGLYCERIN  (NITROSTAT ) 0.4 MG SL tablet Place 1 tablet (0.4 mg total) under the tongue every 5 (five) minutes as needed for chest pain. 25 tablet 3   ondansetron  (ZOFRAN -ODT) 4 MG disintegrating tablet Take 4 mg by mouth every 8 (eight) hours as needed for nausea or vomiting.     pioglitazone  (ACTOS ) 45 MG tablet Take 1 tablet (45 mg total) by mouth daily. 90 tablet 3   ramipril  (ALTACE ) 10 MG capsule TAKE 1 CAPSULE BY MOUTH TWICE  DAILY 180 capsule 3   tadalafil  (CIALIS ) 5 MG tablet Take 1 tablet (5 mg total) by mouth daily. (Patient taking differently: Take 5 mg by mouth daily as needed for erectile dysfunction.) 30 tablet 11   Testosterone  (ANDROGEL ) 20.25 MG/1.25GM (1.62%) GEL Apply 4 Pump topically daily. 150 g 5   traZODone  (DESYREL ) 50 MG tablet Take 1 tablet (50 mg total) by mouth at bedtime as needed for sleep. 90 tablet 0   Vibegron  (GEMTESA ) 75 MG TABS Take 1 tablet (75 mg total) by mouth daily. 28 tablet 0    Allergies Allergies  Allergen Reactions   Other Rash    ECG electrodes, per patient   Protonix  [Pantoprazole ] Other (See Comments)    Exacerbated reflux s/s's    Family History Family History  Problem Relation Age of Onset   Dementia Mother    Crohn's disease Mother    Heart disease Father 64       Valve replacement and CAD, CABG   Diabetes Father    CAD Sister 66   CAD Brother 76       Died age 23   Diabetes Brother     Kidney disease Brother    Colon cancer Neg Hx    Esophageal cancer Neg Hx    Stomach cancer Neg Hx    Rectal cancer Neg Hx    Pancreatic cancer Neg Hx     Review of Systems: Constitutional:  no fevers Eye:  no recent significant change in vision Ears:  No changes in hearing Nose/Mouth/Throat:  no complaints of nasal congestion, no sore throat Cardiovascular: no chest pain Respiratory:  No shortness of breath Gastrointestinal:  No change in bowel habits GU:  No  frequency Integumentary:  no abnormal skin lesions reported Neurologic:  no headaches Endocrine:  denies unexplained weight changes  Exam BP 128/74 (BP Location: Left Arm, Patient Position: Sitting)   Pulse 69   Temp 98 F (36.7 C) (Oral)   Resp 16   Ht 5\' 4"  (1.626 m)   Wt 178 lb (80.7 kg)   SpO2 97%   BMI 30.55 kg/m  General:  well developed, well nourished, in no apparent distress Skin:  no significant moles, warts, or growths Head:  no masses, lesions, or tenderness Eyes:  pupils equal and round, sclera anicteric without injection Ears:  canals without lesions, TMs shiny without retraction, no obvious effusion, no erythema Nose:  nares patent, mucosa normal Throat/Pharynx:  lips and gingiva without lesion; tongue and uvula midline; non-inflamed pharynx; no exudates or postnasal drainage Lungs:  clear to auscultation, breath sounds equal bilaterally, no respiratory distress Cardio:  regular rate and rhythm, no LE edema or bruits Rectal: Deferred GI: BS+, S, NT, ND, no masses or organomegaly Musculoskeletal:  symmetrical muscle groups noted without atrophy or deformity R shoulder-TTP over the right trapezius; no bony tenderness otherwise; decreased active and passive forward flexion.  Negative Neer's, crossover, liftoff, and speeds. Neuro: sensation intact to pinprick of R foot, slightly decreased distal sensation on the plantar surface of L foot; gait normal; deep tendon reflexes normal and symmetric; A&O x  4 Psych: Normal affect  Assessment and Plan  Well adult exam  Essential hypertension  Type 2 diabetes mellitus with hyperglycemia, without long-term current use of insulin  (HCC) - Plan: Lipid panel, Microalbumin / creatinine urine ratio  Trapezius strain, right, initial encounter   Well 73 y.o. male. Counseled on diet and exercise. Other orders as above. Trapezius strain: Heat, ice, Tylenol , stretches and exercises.  Send message in several weeks if no significant improvement and we will consider physical therapy versus trigger point injection. Advanced directive form requesting today.  Follow up in 4 mo for DM visit.  The patient voiced understanding and agreement to the plan.  Shellie Dials [place], DO 10/25/23 10:54 AM

## 2023-10-26 ENCOUNTER — Encounter: Payer: Self-pay | Admitting: Family Medicine

## 2023-11-09 ENCOUNTER — Other Ambulatory Visit: Payer: Self-pay | Admitting: Family Medicine

## 2023-11-09 DIAGNOSIS — I10 Essential (primary) hypertension: Secondary | ICD-10-CM

## 2023-11-09 DIAGNOSIS — I2510 Atherosclerotic heart disease of native coronary artery without angina pectoris: Secondary | ICD-10-CM

## 2023-11-11 ENCOUNTER — Ambulatory Visit

## 2023-11-11 ENCOUNTER — Ambulatory Visit: Payer: Self-pay | Admitting: Family Medicine

## 2023-11-11 ENCOUNTER — Other Ambulatory Visit

## 2023-11-11 ENCOUNTER — Ambulatory Visit (INDEPENDENT_AMBULATORY_CARE_PROVIDER_SITE_OTHER): Admitting: Family Medicine

## 2023-11-11 ENCOUNTER — Ambulatory Visit: Admitting: Family Medicine

## 2023-11-11 ENCOUNTER — Encounter: Payer: Self-pay | Admitting: Family Medicine

## 2023-11-11 VITALS — BP 128/64 | HR 67 | Temp 98.2°F | Resp 18 | Ht 64.0 in | Wt 181.0 lb

## 2023-11-11 DIAGNOSIS — E782 Mixed hyperlipidemia: Secondary | ICD-10-CM

## 2023-11-11 DIAGNOSIS — M7989 Other specified soft tissue disorders: Secondary | ICD-10-CM | POA: Diagnosis not present

## 2023-11-11 DIAGNOSIS — I10 Essential (primary) hypertension: Secondary | ICD-10-CM | POA: Diagnosis not present

## 2023-11-11 DIAGNOSIS — M79662 Pain in left lower leg: Secondary | ICD-10-CM

## 2023-11-11 LAB — HEPATIC FUNCTION PANEL
ALT: 14 U/L (ref 0–53)
AST: 15 U/L (ref 0–37)
Albumin: 4.3 g/dL (ref 3.5–5.2)
Alkaline Phosphatase: 32 U/L — ABNORMAL LOW (ref 39–117)
Bilirubin, Direct: 0.1 mg/dL (ref 0.0–0.3)
Total Bilirubin: 0.4 mg/dL (ref 0.2–1.2)
Total Protein: 6.9 g/dL (ref 6.0–8.3)

## 2023-11-11 LAB — LIPID PANEL
Cholesterol: 108 mg/dL (ref 0–200)
HDL: 46.5 mg/dL (ref >39.00)
LDL Cholesterol: 46 mg/dL (ref 0–99)
NonHDL: 61.16
Total CHOL/HDL Ratio: 2
Triglycerides: 78 mg/dL (ref 0.0–149.0)
VLDL: 15.6 mg/dL (ref 0.0–40.0)

## 2023-11-11 NOTE — Progress Notes (Signed)
 Chief Complaint  Patient presents with   Edema    Left onset: 1 day     Troy Boyer here for left leg swelling.  Duration: 1 d Hx of prolonged bedrest, recent surgery, travel or injury? Yes-has not been very active but denies any injury Pain the calf? No SOB? No Personal or family history of clot or bleeding disorder? No Hx of heart failure, renal failure, hepatic failure? No   Past Medical History:  Diagnosis Date   Adenomatous colon polyp    tubular   CAD (coronary artery disease)    a. Cath 06/08/01 at [facility] with normal LM and LAD, 95% LCx s/p Circumflex stent 4.0 x 15 mm Penta and 85% and 80% prox RCA s/p 3.5 x 23 mm Penta b. cath 01/20/2015 95% prox LCx ISR treated with DES, 55% prox to mid RCA, 45% distal RCA, 40% midLAD, EF 55%     Cataract    Diabetes mellitus without complication (HCC)    Diarrhea    Diverticulosis    Erectile dysfunction    Gallstones    Hyperlipidemia    Hypertension    Myocardial infarction [facility])    Family History  Problem Relation Age of Onset   Dementia Mother    Crohn's disease Mother    Heart disease Father 104       Valve replacement and CAD, CABG   Diabetes Father    CAD Sister 6   CAD Brother 69       Died age 74   Diabetes Brother    Kidney disease Brother    Colon cancer Neg Hx    Esophageal cancer Neg Hx    Stomach cancer Neg Hx    Rectal cancer Neg Hx    Pancreatic cancer Neg Hx    Past Surgical History:  Procedure Laterality Date   ANGIOPLASTY  2002   2 stents   BALLOON DILATION N/A 08/27/2023   Procedure: BALLOON DILATION;  Surgeon: Tobin Forts, MD;  Location: [facility];  Service: Gastroenterology;  Laterality: N/A;   CARDIAC CATHETERIZATION N/A 01/20/2015   Procedure: Left Heart Cath and Coronary Angiography;  Surgeon: Arty Binning, MD;  Location: [facility] INVASIVE CV LAB;  Service: Cardiovascular;  Laterality: N/A;   CARDIAC CATHETERIZATION N/A 01/27/2016   Procedure: Left Heart Cath and Coronary Angiography;   Surgeon: Arnoldo Lapping, MD;  Location: [facility] INVASIVE CV LAB;  Service: Cardiovascular;  Laterality: N/A;   CHOLECYSTECTOMY N/A 02/09/2016   Procedure: LAPAROSCOPIC CHOLECYSTECTOMY WITH INTRAOPERATIVE CHOLANGIOGRAM;  Surgeon: Sim Dryer, MD;  Location: [facility] OR;  Service: General;  Laterality: N/A;   COLONOSCOPY W/ POLYPECTOMY  08/2013   Avg risk screening, Dr Christianna Cowman. 5 mm tubular adenoma ascending.  Mild to moderated descending and sigmoid tics.  Internal hemorrhoids   CORONARY ANGIOPLASTY WITH STENT PLACEMENT     ERCP N/A 02/13/2016   Procedure: ENDOSCOPIC RETROGRADE CHOLANGIOPANCREATOGRAPHY (ERCP);  Surgeon: Albertina Hugger, MD;  Location: [facility] ENDOSCOPY;  Service: Gastroenterology;  Laterality: N/A;   ERCP N/A 08/27/2023   Procedure: ENDOSCOPIC RETROGRADE CHOLANGIOPANCREATOGRAPHY (ERCP);  Surgeon: Tobin Forts, MD;  Location: [facility];  Service: Gastroenterology;  Laterality: N/A;   REMOVAL OF STONES  08/27/2023   Procedure: REMOVAL OF STONES;  Surgeon: Tobin Forts, MD;  Location: [facility];  Service: Gastroenterology;;   ROTATOR CUFF REPAIR Right 2009   ROTATOR CUFF REPAIR Left 2014   SPHINCTEROTOMY  08/27/2023   Procedure: SPHINCTEROTOMY;  Surgeon: Tobin Forts, MD;  Location: [facility];  Service: Gastroenterology;;   TRIGGER FINGER RELEASE Bilateral 2013   4 fingers, middle finger on both hands    Current Outpatient Medications:    amLODipine  (NORVASC ) 5 MG tablet, Take 1 tablet (5 mg total) by mouth daily., Disp: 90 tablet, Rfl: 1   atorvastatin  (LIPITOR ) 80 MG tablet, Take 1 tablet (80 mg total) by mouth at bedtime., Disp: 90 tablet, Rfl: 1   Continuous Glucose Sensor (FREESTYLE LIBRE 3 SENSOR) MISC, Use to check blood sugar, Disp: 1 each, Rfl: 1   Continuous Glucose Sensor (FREESTYLE LIBRE 3 SENSOR) MISC, Place 1 sensor on the skin every 14 days. Use to check glucose continuously, Disp: 2 each, Rfl: 3   ezetimibe  (ZETIA ) 10 MG tablet, Take 1 tablet by mouth once daily,  Disp: 90 tablet, Rfl: 1   fenofibrate  (TRICOR ) 48 MG tablet, Take 1 tablet (48 mg total) by mouth daily., Disp: 90 tablet, Rfl: 0   glucose blood (ACCU-CHEK GUIDE) test strip, Use daily to check blood sugar.  DX E11.65, Disp: 100 each, Rfl: 3   Krill Oil 500 MG CAPS, Take 500 mg by mouth 2 (two) times daily., Disp: , Rfl:    metFORMIN  (GLUCOPHAGE -XR) 500 MG 24 hr tablet, Take 2 tablets (1,000 mg total) by mouth daily with breakfast., Disp: , Rfl:    metoprolol  tartrate (LOPRESSOR ) 25 MG tablet, Take 1 tablet (25 mg total) by mouth 2 (two) times daily. KEEP OV., Disp: 180 tablet, Rfl: 0   Multiple Vitamin (MULTIVITAMIN WITH MINERALS) TABS tablet, Take 1 tablet by mouth daily., Disp: , Rfl:    nitroGLYCERIN  (NITROSTAT ) 0.4 MG SL tablet, Place 1 tablet (0.4 mg total) under the tongue every 5 (five) minutes as needed for chest pain., Disp: 25 tablet, Rfl: 3   ondansetron  (ZOFRAN -ODT) 4 MG disintegrating tablet, Take 4 mg by mouth every 8 (eight) hours as needed for nausea or vomiting., Disp: , Rfl:    pioglitazone  (ACTOS ) 45 MG tablet, Take 1 tablet (45 mg total) by mouth daily., Disp: 90 tablet, Rfl: 3   ramipril  (ALTACE ) 10 MG capsule, TAKE 1 CAPSULE BY MOUTH TWICE  DAILY, Disp: 180 capsule, Rfl: 3   tadalafil  (CIALIS ) 5 MG tablet, Take 1 tablet (5 mg total) by mouth daily. (Patient taking differently: Take 5 mg by mouth daily as needed for erectile dysfunction.), Disp: 30 tablet, Rfl: 11   Testosterone  (ANDROGEL ) 20.25 MG/1.25GM (1.62%) GEL, Apply 4 Pump topically daily., Disp: 150 g, Rfl: 5   traZODone  (DESYREL ) 50 MG tablet, Take 1 tablet (50 mg total) by mouth at bedtime as needed for sleep., Disp: 90 tablet, Rfl: 0   Vibegron  (GEMTESA ) 75 MG TABS, Take 1 tablet (75 mg total) by mouth daily., Disp: 28 tablet, Rfl: 0  BP 128/64   Pulse 67   Temp 98.2 F (36.8 C)   Resp 18   Ht 5\' 4"  (1.626 m)   Wt 181 lb (82.1 kg)   SpO2 93%   BMI 31.07 kg/m  Gen- awake, alert, appears stated age Heart-  RRR, 2+ pitting edema, left lower extremity tapering at the proximal third of the tibia.  Right lower extremity swelling 1+ pitting tapering at the proximal third of the tibia. Lungs- CTAB, normal effort w/o accessory muscle use MSK- +R calf pain, negative Homans bilaterally Psych: Age appropriate judgment and insight  Swelling of left lower extremity - Plan: US  Venous Img Lower Unilateral Left, CANCELED: US  Venous Img Lower Unilateral Left  Pain of left calf -  Plan: US  Venous Img Lower Unilateral Left, CANCELED: US  Venous Img Lower Unilateral Left  He will go to the med center [place] now for a lower extremity duplex ultrasound to rule out a DVT.  Stretches and exercises were provided for a calf strain and to help with lower extremity swelling should this test be negative.  He was instructed not to start these until we have the results of his scan.  Heat, ice, Tylenol . Pt voiced understanding and agreement to the plan.  Shellie Dials [place], DO 11/11/23  11:04 AM

## 2023-11-11 NOTE — Patient Instructions (Addendum)
 Please get your scan done at the [facility] in [place]: Park in the lower level [address] [place], [place] [address] [phone]  You do not need an appointment for this location.   Heat (pad or rice pillow in microwave) over affected area, 10-15 minutes twice daily.   Ice/cold pack over area for 10-15 min twice daily.  OK to take Tylenol  1000 mg (2 extra strength tabs) or 975 mg (3 regular strength tabs) every 6 hours as needed.  Let us  know if you need anything.  Stretching and range of motion exercises These exercises warm up your muscles and joints and improve the movement and flexibility of your lower leg. These exercises also help to relieve pain and stiffness.  Exercise A: Gastrocnemius stretch Sit with your left / right leg extended. Loop a belt or towel around the ball of your left / right foot. The ball of your foot is on the walking surface, right under your toes. Hold both ends of the belt or towel. Keep your left / right ankle and foot relaxed and keep your knee straight while you use the belt or towel to pull your foot and ankle toward you. Stop at the first point of resistance. Hold this position for 30 seconds. Repeat 2 times. Complete this exercise 3 times per week.  Exercise B: Ankle alphabet Sit with your left / right leg supported at the lower leg. Do not rest your foot on anything. Make sure your foot has room to move freely. Think of your left / right foot as a paintbrush, and move your foot to trace each letter of the alphabet in the air. Keep your hip and knee still while you trace. Trace every letter from A to Z. Repeat 2 times. Complete this exercise 3 times per week.  Strengthening exercises These exercises build strength and endurance in your lower leg. Endurance is the ability to use your muscles for a long time, even after they get tired.  Exercise C: Plantar flexors with band Sit with your left / right leg extended. Loop a  rubber exercise band or tube around the ball of your left / right foot. The ball of your foot is on the walking surface, right under your toes. While holding both ends of the band or tube, slowly point your toes downward, pushing them away from you. Hold this position for 3 seconds. Slowly return your foot to the starting position and repeat for a total of 10 repetitions. Repeat 2 times. Complete this exercise 3 times per week.  Exercise D: Plantar flexors, standing Stand with your feet shoulder-width apart. Place your hands on a wall or table to steady yourself as needed, but try not to use it very much for support. Rise up on your toes. If this exercise is too easy, try these options: Shift your weight toward your left / right leg until you feel challenged. If told by your health care provider, stand on your left / right foot only. Hold this position for 3 seconds. Repeat for a total of 10 repetitions. Repeat 2 times. Complete this exercise 3 times per week.  Exercise E: Plantar flexors, eccentric Stand on the balls of your feet on the edge of a step. The ball of your foot is on the walking surface, right under your toes. Place your hands on a wall or railing for balance as needed, but try not to lean on it for support. Rise up on your toes, using  both legs to help. Slowly shift all of your weight to your left / right foot and lift your other foot off the step. Slowly lower your left / right heel so it drops below the level of the step. You will feel a slight stretch in your left / right calf. Put your other foot back onto the step. Repeat 2 times. Complete this exercise 3 times per week. This information is not intended to replace advice given to you by your health care provider. Make sure you discuss any questions you have with your health care provider.

## 2023-11-14 NOTE — Progress Notes (Signed)
 HPI: FU CAD.  Previously followed in [facility]. Patient had a non-ST elevation myocardial infarction in 2002. Cardiac catheterization revealed an 80% proximal right coronary artery followed by an 85%. There was a 90% circumflex. LV function normal. Patient had PCI of his circumflex and right coronary artery. Nuclear study in July of 2012 showed an ejection fraction of 76%. No ischemia.    Admitted 7/16 with a NSTEMI.  LHC demonstrated pCFX 95% ISR which was treated with a DES.  Residual CAD included mLAD 40%, pRCA stent ok with 25% ISR, mRCA 55%, dRCA 45%, RPDA 30%. EF was preserved. Repeat catheterization August 2017 secondary to chest pain revealed moderate coronary disease without significant obstruction. Medical therapy recommended. LV function normal. Head CT showed possible normal pressure hydrocephalus but neurology did not think further therapy indicated. Abd ultrasound November 2023 showed no abdominal aortic aneurysm.  Echocardiogram February 2024 showed normal LV function, grade 1 diastolic dysfunction, mild left atrial enlargement, mild mitral stenosis with mean gradient 5 mmHg, mild mitral regurgitation.  Venous Dopplers May 2025 showed no DVT.   Since last seen, patient denies dyspnea, chest pain, palpitations or syncope.  Current Outpatient Medications  Medication Sig Dispense Refill   amLODipine  (NORVASC ) 5 MG tablet Take 1 tablet (5 mg total) by mouth daily. 90 tablet 1   atorvastatin  (LIPITOR ) 80 MG tablet Take 1 tablet (80 mg total) by mouth at bedtime. 90 tablet 1   Continuous Glucose Sensor (FREESTYLE LIBRE 3 SENSOR) MISC Place 1 sensor on the skin every 14 days. Use to check glucose continuously 2 each 3   ezetimibe  (ZETIA ) 10 MG tablet Take 1 tablet by mouth once daily 90 tablet 1   fenofibrate  (TRICOR ) 48 MG tablet Take 1 tablet (48 mg total) by mouth daily. 90 tablet 0   glucose blood (ACCU-CHEK GUIDE) test strip Use daily to check blood sugar.  DX E11.65 100 each 3    Krill Oil 500 MG CAPS Take 500 mg by mouth 2 (two) times daily.     metFORMIN  (GLUCOPHAGE -XR) 500 MG 24 hr tablet Take 2 tablets (1,000 mg total) by mouth daily with breakfast.     metoprolol  tartrate (LOPRESSOR ) 25 MG tablet Take 1 tablet (25 mg total) by mouth 2 (two) times daily. KEEP OV. 180 tablet 0   Multiple Vitamin (MULTIVITAMIN WITH MINERALS) TABS tablet Take 1 tablet by mouth daily.     nitroGLYCERIN  (NITROSTAT ) 0.4 MG SL tablet Place 1 tablet (0.4 mg total) under the tongue every 5 (five) minutes as needed for chest pain. 25 tablet 3   pioglitazone  (ACTOS ) 45 MG tablet Take 1 tablet (45 mg total) by mouth daily. 90 tablet 3   ramipril  (ALTACE ) 10 MG capsule TAKE 1 CAPSULE BY MOUTH TWICE  DAILY 180 capsule 3   tadalafil  (CIALIS ) 5 MG tablet Take 1 tablet (5 mg total) by mouth daily. (Patient taking differently: Take 5 mg by mouth daily as needed for erectile dysfunction.) 30 tablet 11   Testosterone  (ANDROGEL ) 20.25 MG/1.25GM (1.62%) GEL Apply 4 Pump topically daily. 150 g 5   traZODone  (DESYREL ) 50 MG tablet Take 1 tablet (50 mg total) by mouth at bedtime as needed for sleep. 90 tablet 0   Vibegron  (GEMTESA ) 75 MG TABS Take 1 tablet (75 mg total) by mouth daily. 28 tablet 0   No current facility-administered medications for this visit.     Past Medical History:  Diagnosis Date   Adenomatous colon polyp    tubular  CAD (coronary artery disease)    a. Cath 06/08/01 at [facility] with normal LM and LAD, 95% LCx s/p Circumflex stent 4.0 x 15 mm Penta and 85% and 80% prox RCA s/p 3.5 x 23 mm Penta b. cath 01/20/2015 95% prox LCx ISR treated with DES, 55% prox to mid RCA, 45% distal RCA, 40% midLAD, EF 55%     Cataract    Diabetes mellitus without complication (HCC)    Diarrhea    Diverticulosis    Erectile dysfunction    Gallstones    Hyperlipidemia    Hypertension    Myocardial infarction [facility])     Past Surgical History:  Procedure Laterality Date   ANGIOPLASTY  2002   2  stents   BALLOON DILATION N/A 08/27/2023   Procedure: BALLOON DILATION;  Surgeon: Tobin Forts, MD;  Location: [facility];  Service: Gastroenterology;  Laterality: N/A;   CARDIAC CATHETERIZATION N/A 01/20/2015   Procedure: Left Heart Cath and Coronary Angiography;  Surgeon: Arty Binning, MD;  Location: [facility] INVASIVE CV LAB;  Service: Cardiovascular;  Laterality: N/A;   CARDIAC CATHETERIZATION N/A 01/27/2016   Procedure: Left Heart Cath and Coronary Angiography;  Surgeon: Arnoldo Lapping, MD;  Location: [facility] INVASIVE CV LAB;  Service: Cardiovascular;  Laterality: N/A;   CHOLECYSTECTOMY N/A 02/09/2016   Procedure: LAPAROSCOPIC CHOLECYSTECTOMY WITH INTRAOPERATIVE CHOLANGIOGRAM;  Surgeon: Sim Dryer, MD;  Location: [facility] OR;  Service: General;  Laterality: N/A;   COLONOSCOPY W/ POLYPECTOMY  08/2013   Avg risk screening, Dr Christianna Cowman. 5 mm tubular adenoma ascending.  Mild to moderated descending and sigmoid tics.  Internal hemorrhoids   CORONARY ANGIOPLASTY WITH STENT PLACEMENT     ERCP N/A 02/13/2016   Procedure: ENDOSCOPIC RETROGRADE CHOLANGIOPANCREATOGRAPHY (ERCP);  Surgeon: Albertina Hugger, MD;  Location: [facility] ENDOSCOPY;  Service: Gastroenterology;  Laterality: N/A;   ERCP N/A 08/27/2023   Procedure: ENDOSCOPIC RETROGRADE CHOLANGIOPANCREATOGRAPHY (ERCP);  Surgeon: Tobin Forts, MD;  Location: [facility];  Service: Gastroenterology;  Laterality: N/A;   REMOVAL OF STONES  08/27/2023   Procedure: REMOVAL OF STONES;  Surgeon: Tobin Forts, MD;  Location: [facility];  Service: Gastroenterology;;   ROTATOR CUFF REPAIR Right 2009   ROTATOR CUFF REPAIR Left 2014   SPHINCTEROTOMY  08/27/2023   Procedure: SPHINCTEROTOMY;  Surgeon: Tobin Forts, MD;  Location: [facility];  Service: Gastroenterology;;   TRIGGER FINGER RELEASE Bilateral 2013   4 fingers, middle finger on both hands    Social History   Socioeconomic History   Marital status: Significant Other    Spouse name: Not on file   Number of  children: 2   Years of education: 75   Highest education level: Bachelor's degree (e.g., BA, AB, BS)  Occupational History   Occupation: Airline pilot- retired in 2018    Employer: [employer]  Tobacco Use   Smoking status: Former    Current packs/day: 0.00    Average packs/day: 1 pack/day for 45.0 years (45.0 ttl pk-yrs)    Types: Cigarettes    Start date: 01/16/1970    Quit date: 01/17/2015    Years since quitting: 8.8   Smokeless tobacco: Never  Vaping Use   Vaping status: Never Used  Substance and Sexual Activity   Alcohol use: Not Currently    Alcohol/week: 1.0 standard drink of alcohol    Types: 1 Glasses of wine per week    Comment: Previously 1 large vodka tonic per day Quit 06/2023   Drug use: No  Sexual activity: Yes  Other Topics Concern   Not on file  Social History Narrative   Fun: Boat, golf   Denies religious beliefs effecting health care.    Social Drivers of Corporate investment banker Strain: Low Risk  (08/20/2023)   Overall Financial Resource Strain (CARDIA)    Difficulty of Paying Living Expenses: Not hard at all  Food Insecurity: No Food Insecurity (09/01/2023)   Hunger Vital Sign    Worried About Running Out of Food in the Last Year: Never true    Ran Out of Food in the Last Year: Never true  Transportation Needs: No Transportation Needs (09/01/2023)   PRAPARE - Administrator, Civil Service (Medical): No    Lack of Transportation (Non-Medical): No  Physical Activity: Inactive (08/20/2023)   Exercise Vital Sign    Days of Exercise per Week: 0 days    Minutes of Exercise per Session: 90 min  Stress: Stress Concern Present (08/20/2023)   [employer] of Occupational Health - Occupational Stress Questionnaire    Feeling of Stress : To some extent  Social Connections: Socially Integrated (08/27/2023)   Social Connection and Isolation Panel [NHANES]    Frequency of Communication with Friends and Family: Twice a week    Frequency of Social  Gatherings with Friends and Family: Once a week    Attends Religious Services: More than 4 times per year    Active Member of [employer] or Organizations: No    Attends Engineer, structural: More than 4 times per year    Marital Status: Living with partner  Intimate Partner Violence: Not At Risk (09/01/2023)   Humiliation, Afraid, Rape, and Kick questionnaire    Fear of Current or Ex-Partner: No    Emotionally Abused: No    Physically Abused: No    Sexually Abused: No    Family History  Problem Relation Age of Onset   Dementia Mother    Crohn's disease Mother    Heart disease Father 2       Valve replacement and CAD, CABG   Diabetes Father    CAD Sister 63   CAD Brother 33       Died age 33   Diabetes Brother    Kidney disease Brother    Colon cancer Neg Hx    Esophageal cancer Neg Hx    Stomach cancer Neg Hx    Rectal cancer Neg Hx    Pancreatic cancer Neg Hx     ROS: no fevers or chills, productive cough, hemoptysis, dysphasia, odynophagia, melena, hematochezia, dysuria, hematuria, rash, seizure activity, orthopnea, PND, pedal edema, claudication. Remaining systems are negative.  Physical Exam: Well-developed well-nourished in no acute distress.  Skin is warm and dry.  HEENT is normal.  Neck is supple.  Chest is clear to auscultation with normal expansion.  Cardiovascular exam is regular rate and rhythm.  Abdominal exam nontender or distended. No masses palpated. Extremities show no edema. neuro grossly intact   A/P  1 coronary artery disease-patient denies chest pain.  Continue aspirin  and statin.  2 hypertension-blood pressure controlled.  Continue present medications.  3 hyperlipidemia-continue Lipitor , Zetia  and fenofibrate .  4 mitral stenosis-repeat echo.  5 liver mass-followed by gastroenterology with workup in progress.  Alexandria Angel, MD

## 2023-11-18 ENCOUNTER — Encounter: Payer: Self-pay | Admitting: Cardiology

## 2023-11-18 ENCOUNTER — Ambulatory Visit: Admitting: Urology

## 2023-11-18 ENCOUNTER — Ambulatory Visit: Payer: Medicare Other | Attending: Cardiology | Admitting: Cardiology

## 2023-11-18 ENCOUNTER — Encounter: Payer: Self-pay | Admitting: Urology

## 2023-11-18 VITALS — BP 122/64 | HR 70 | Ht 64.0 in | Wt 182.0 lb

## 2023-11-18 VITALS — BP 157/75 | HR 76 | Ht 64.0 in | Wt 182.0 lb

## 2023-11-18 DIAGNOSIS — N138 Other obstructive and reflux uropathy: Secondary | ICD-10-CM

## 2023-11-18 DIAGNOSIS — E291 Testicular hypofunction: Secondary | ICD-10-CM

## 2023-11-18 DIAGNOSIS — I10 Essential (primary) hypertension: Secondary | ICD-10-CM

## 2023-11-18 DIAGNOSIS — N401 Enlarged prostate with lower urinary tract symptoms: Secondary | ICD-10-CM

## 2023-11-18 DIAGNOSIS — E785 Hyperlipidemia, unspecified: Secondary | ICD-10-CM | POA: Diagnosis not present

## 2023-11-18 DIAGNOSIS — I059 Rheumatic mitral valve disease, unspecified: Secondary | ICD-10-CM

## 2023-11-18 DIAGNOSIS — N529 Male erectile dysfunction, unspecified: Secondary | ICD-10-CM

## 2023-11-18 DIAGNOSIS — I2510 Atherosclerotic heart disease of native coronary artery without angina pectoris: Secondary | ICD-10-CM | POA: Diagnosis not present

## 2023-11-18 LAB — URINALYSIS, ROUTINE W REFLEX MICROSCOPIC
Bilirubin, UA: NEGATIVE
Glucose, UA: NEGATIVE
Ketones, UA: NEGATIVE
Leukocytes,UA: NEGATIVE
Nitrite, UA: NEGATIVE
Protein,UA: NEGATIVE
Specific Gravity, UA: 1.015 (ref 1.005–1.030)
Urobilinogen, Ur: 0.2 mg/dL (ref 0.2–1.0)
pH, UA: 5.5 (ref 5.0–7.5)

## 2023-11-18 LAB — MICROSCOPIC EXAMINATION

## 2023-11-18 LAB — BLADDER SCAN AMB NON-IMAGING

## 2023-11-18 MED ORDER — GEMTESA 75 MG PO TABS: 75.0000 mg | ORAL_TABLET | Freq: Every day | ORAL | 11 refills | Status: DC

## 2023-11-18 MED ORDER — NITROGLYCERIN 0.4 MG SL SUBL: 0.4000 mg | SUBLINGUAL_TABLET | SUBLINGUAL | 3 refills | Status: AC | PRN

## 2023-11-18 NOTE — Progress Notes (Signed)
 Assessment: 1. BPH with obstruction/lower urinary tract symptoms   2. Organic impotence   3. Hypogonadism in male     Plan: Continue tadalafil  5 mg daily. He like to continue to try the Gemtesa .  Additional samples provided.  I also provided him with a prescription.    Continue Androgel  1.62% 4 pumps/day  Labs in 1 month: Testosterone , CBC  Return to office in 6-8 weeks.  Chief Complaint:  Chief Complaint  Patient presents with   Benign Prostatic Hypertrophy    History of Present Illness:  Troy Boyer is a 73 y.o. male who is seen for evaluation of BPH with lower urinary tract symptoms, hypogonadism, and erectile dysfunction. He has been followed for these urologic issues at [facility] by Dr. Sherrine Dolly.  His last visit was in February 2025.  BPH with LUTS: He has recently been managed with tadalafil  5 mg daily and Myrbetriq  50 mg daily.  He reported his urinary symptoms were stable and his visit on 08/18/2023. PSA from 2/25: 3.39 He reported that the Myrbetriq  was not controlling his urgency and urge incontinence.  He was having frequency voiding every 2 hours and nocturia x 3 and requiring the use of adult briefs for his incontinence.  No dysuria or gross hematuria. IPSS = 17/5. PVR = 143 ml. He was given a trial of Gemtesa  in place of Myrbetriq .  He reports some improvement in his symptoms with the Gemtesa .  He does feel like this works better than the Myrbetriq .  However, he continues to have urgency and urge incontinence requiring use of adult briefs.  No side effects from the medication.  No dysuria or gross hematuria. IPSS = 16/4.  ED: He has been managed with tadalafil  5 mg daily with good results. He continues to have satisfactory results with the daily tadalafil .  Hypogonadism: He was previously on Clomid but this was discontinued due to cost of the medication and lack of insurance coverage for the medication.  He noted a return of his hypogonadal symptoms  after stopping the Clomid.   Testosterone  2/25: 283.5 He had resumed the Clomid and noted symptomatic benefit.  He was interested in alternative therapies for management of his hypogonadism. He was started on topical testosterone  1.62% 4 pumps/day.  He has been using the topical testosterone  for approximately 2 weeks.  He has noted some slight improvement in his symptoms.  Portions of the above documentation were copied from a prior visit for review purposes only.   Past Medical History:  Past Medical History:  Diagnosis Date   Adenomatous colon polyp    tubular   CAD (coronary artery disease)    a. Cath 06/08/01 at [facility] with normal LM and LAD, 95% LCx s/p Circumflex stent 4.0 x 15 mm Penta and 85% and 80% prox RCA s/p 3.5 x 23 mm Penta b. cath 01/20/2015 95% prox LCx ISR treated with DES, 55% prox to mid RCA, 45% distal RCA, 40% midLAD, EF 55%     Cataract    Diabetes mellitus without complication (HCC)    Diarrhea    Diverticulosis    Erectile dysfunction    Gallstones    Hyperlipidemia    Hypertension    Myocardial infarction [facility])     Past Surgical History:  Past Surgical History:  Procedure Laterality Date   ANGIOPLASTY  2002   2 stents   BALLOON DILATION N/A 08/27/2023   Procedure: BALLOON DILATION;  Surgeon: Tobin Forts, MD;  Location: [facility];  Service: Gastroenterology;  Laterality: N/A;   CARDIAC CATHETERIZATION N/A 01/20/2015   Procedure: Left Heart Cath and Coronary Angiography;  Surgeon: Arty Binning, MD;  Location: [facility] INVASIVE CV LAB;  Service: Cardiovascular;  Laterality: N/A;   CARDIAC CATHETERIZATION N/A 01/27/2016   Procedure: Left Heart Cath and Coronary Angiography;  Surgeon: Arnoldo Lapping, MD;  Location: [facility] INVASIVE CV LAB;  Service: Cardiovascular;  Laterality: N/A;   CHOLECYSTECTOMY N/A 02/09/2016   Procedure: LAPAROSCOPIC CHOLECYSTECTOMY WITH INTRAOPERATIVE CHOLANGIOGRAM;  Surgeon: Sim Dryer, MD;  Location: [facility] OR;  Service: General;   Laterality: N/A;   COLONOSCOPY W/ POLYPECTOMY  08/2013   Avg risk screening, Dr Christianna Cowman. 5 mm tubular adenoma ascending.  Mild to moderated descending and sigmoid tics.  Internal hemorrhoids   CORONARY ANGIOPLASTY WITH STENT PLACEMENT     ERCP N/A 02/13/2016   Procedure: ENDOSCOPIC RETROGRADE CHOLANGIOPANCREATOGRAPHY (ERCP);  Surgeon: Albertina Hugger, MD;  Location: [facility] ENDOSCOPY;  Service: Gastroenterology;  Laterality: N/A;   ERCP N/A 08/27/2023   Procedure: ENDOSCOPIC RETROGRADE CHOLANGIOPANCREATOGRAPHY (ERCP);  Surgeon: Tobin Forts, MD;  Location: [facility];  Service: Gastroenterology;  Laterality: N/A;   REMOVAL OF STONES  08/27/2023   Procedure: REMOVAL OF STONES;  Surgeon: Tobin Forts, MD;  Location: [facility];  Service: Gastroenterology;;   ROTATOR CUFF REPAIR Right 2009   ROTATOR CUFF REPAIR Left 2014   SPHINCTEROTOMY  08/27/2023   Procedure: SPHINCTEROTOMY;  Surgeon: Tobin Forts, MD;  Location: [facility];  Service: Gastroenterology;;   TRIGGER FINGER RELEASE Bilateral 2013   4 fingers, middle finger on both hands    Allergies:  Allergies  Allergen Reactions   Other Rash    ECG electrodes, per patient   Protonix  [Pantoprazole ] Other (See Comments)    Exacerbated reflux s/s's    Family History:  Family History  Problem Relation Age of Onset   Dementia Mother    Crohn's disease Mother    Heart disease Father 20       Valve replacement and CAD, CABG   Diabetes Father    CAD Sister 44   CAD Brother 20       Died age 89   Diabetes Brother    Kidney disease Brother    Colon cancer Neg Hx    Esophageal cancer Neg Hx    Stomach cancer Neg Hx    Rectal cancer Neg Hx    Pancreatic cancer Neg Hx     Social History:  Social History   Tobacco Use   Smoking status: Former    Current packs/day: 0.00    Average packs/day: 1 pack/day for 45.0 years (45.0 ttl pk-yrs)    Types: Cigarettes    Start date: 01/16/1970    Quit date: 01/17/2015    Years since  quitting: 8.8   Smokeless tobacco: Never  Vaping Use   Vaping status: Never Used  Substance Use Topics   Alcohol use: Not Currently    Alcohol/week: 1.0 standard drink of alcohol    Types: 1 Glasses of wine per week    Comment: Previously 1 large vodka tonic per day Quit 06/2023   Drug use: No    ROS: Constitutional:  Negative for fever, chills, weight loss CV: Negative for chest pain, previous MI, hypertension Respiratory:  Negative for shortness of breath, wheezing, sleep apnea, frequent cough GI:  Negative for nausea, vomiting, bloody stool, GERD  Physical exam: BP (!) 157/75   Pulse 76   Ht 5\' 4"  (1.626 m)  Wt 182 lb (82.6 kg)   BMI 31.24 kg/m  GENERAL APPEARANCE:  Well appearing, well developed, well nourished, NAD HEENT:  Atraumatic, normocephalic, oropharynx clear NECK:  Supple without lymphadenopathy or thyromegaly ABDOMEN:  Soft, non-tender, no masses EXTREMITIES:  Moves all extremities well, without clubbing, cyanosis, or edema NEUROLOGIC:  Alert and oriented x 3, normal gait, CN II-XII grossly intact MENTAL STATUS:  appropriate BACK:  Non-tender to palpation, No CVAT SKIN:  Warm, dry, and intact  Results: U/A: 0-5 WBCs, 0-2 RBCs  PVR =  86 ml

## 2023-11-18 NOTE — Patient Instructions (Signed)
 Medication Instructions:  No medication changes were made at this visit. Continue current regimen.   *If you need a refill on your cardiac medications before your next appointment, please call your pharmacy*  Lab Work: None ordered today. If you have labs (blood work) drawn today and your tests are completely normal, you will receive your results only by: MyChart Message (if you have MyChart) OR A paper copy in the mail If you have any lab test that is abnormal or we need to change your treatment, we will call you to review the results.  Testing/Procedures: Your physician has requested that you have an echocardiogram. Echocardiography is a painless test that uses sound waves to create images of your heart. It provides your doctor with information about the size and shape of your heart and how well your heart's chambers and valves are working. This procedure takes approximately one hour. There are no restrictions for this procedure. Please do NOT wear cologne, perfume, aftershave, or lotions (deodorant is allowed). Please arrive 15 minutes prior to your appointment time.  Please note: We ask at that you not bring children with you during ultrasound (echo/ vascular) testing. Due to room size and safety concerns, children are not allowed in the ultrasound rooms during exams. Our front office staff cannot provide observation of children in our lobby area while testing is being conducted. An adult accompanying a patient to their appointment will only be allowed in the ultrasound room at the discretion of the ultrasound technician under special circumstances. We apologize for any inconvenience.   Follow-Up: At [facility], you and your health needs are our priority.  As part of our continuing mission to provide you with exceptional heart care, our providers are all part of one team.  This team includes your primary Cardiologist (physician) and Advanced Practice Providers or APPs (Physician  Assistants and Nurse Practitioners) who all work together to provide you with the care you need, when you need it.  Your next appointment:   1 year(s)  Provider:   Alexandria Angel, MD

## 2023-11-22 ENCOUNTER — Other Ambulatory Visit: Payer: Self-pay | Admitting: Cardiology

## 2023-11-23 ENCOUNTER — Ambulatory Visit (HOSPITAL_BASED_OUTPATIENT_CLINIC_OR_DEPARTMENT_OTHER)
Admission: RE | Admit: 2023-11-23 | Discharge: 2023-11-23 | Disposition: A | Discharge status: Home / Self Care | Source: Ambulatory Visit | Attending: Gastroenterology | Admitting: Gastroenterology

## 2023-11-23 DIAGNOSIS — R932 Abnormal findings on diagnostic imaging of liver and biliary tract: Secondary | ICD-10-CM | POA: Diagnosis not present

## 2023-11-23 DIAGNOSIS — I700 Atherosclerosis of aorta: Secondary | ICD-10-CM | POA: Diagnosis not present

## 2023-11-23 DIAGNOSIS — N281 Cyst of kidney, acquired: Secondary | ICD-10-CM | POA: Diagnosis not present

## 2023-11-23 DIAGNOSIS — R160 Hepatomegaly, not elsewhere classified: Secondary | ICD-10-CM | POA: Diagnosis not present

## 2023-11-23 DIAGNOSIS — K769 Liver disease, unspecified: Secondary | ICD-10-CM | POA: Diagnosis not present

## 2023-11-23 DIAGNOSIS — K7689 Other specified diseases of liver: Secondary | ICD-10-CM | POA: Diagnosis not present

## 2023-11-23 MED ORDER — GADOBUTROL 1 MMOL/ML IV SOLN
8.0000 mL | Freq: Once | INTRAVENOUS | Status: AC | PRN
  Administered 2023-11-23: 8 mL via INTRAVENOUS

## 2023-11-24 ENCOUNTER — Ambulatory Visit: Payer: Self-pay | Admitting: Gastroenterology

## 2023-12-05 ENCOUNTER — Other Ambulatory Visit: Payer: Self-pay | Admitting: Physician Assistant

## 2023-12-05 ENCOUNTER — Other Ambulatory Visit: Payer: Self-pay | Admitting: Family Medicine

## 2023-12-20 ENCOUNTER — Other Ambulatory Visit: Payer: Self-pay | Admitting: Cardiology

## 2023-12-20 DIAGNOSIS — I059 Rheumatic mitral valve disease, unspecified: Secondary | ICD-10-CM

## 2023-12-20 DIAGNOSIS — I10 Essential (primary) hypertension: Secondary | ICD-10-CM

## 2023-12-20 DIAGNOSIS — E785 Hyperlipidemia, unspecified: Secondary | ICD-10-CM

## 2023-12-20 DIAGNOSIS — I2510 Atherosclerotic heart disease of native coronary artery without angina pectoris: Secondary | ICD-10-CM

## 2023-12-21 ENCOUNTER — Other Ambulatory Visit: Payer: Self-pay | Admitting: Family Medicine

## 2023-12-21 DIAGNOSIS — G4700 Insomnia, unspecified: Secondary | ICD-10-CM

## 2023-12-22 ENCOUNTER — Other Ambulatory Visit

## 2023-12-22 DIAGNOSIS — E291 Testicular hypofunction: Secondary | ICD-10-CM

## 2023-12-23 LAB — CBC
Hematocrit: 42.1 % (ref 37.5–51.0)
Hemoglobin: 13.2 g/dL (ref 13.0–17.7)
MCH: 29.9 pg (ref 26.6–33.0)
MCHC: 31.4 g/dL — ABNORMAL LOW (ref 31.5–35.7)
MCV: 95 fL (ref 79–97)
Platelets: 240 10*3/uL (ref 150–450)
RBC: 4.42 x10E6/uL (ref 4.14–5.80)
RDW: 13.1 % (ref 11.6–15.4)
WBC: 6.7 10*3/uL (ref 3.4–10.8)

## 2023-12-23 LAB — TESTOSTERONE: Testosterone: 498 ng/dL (ref 264–916)

## 2023-12-26 ENCOUNTER — Ambulatory Visit (INDEPENDENT_AMBULATORY_CARE_PROVIDER_SITE_OTHER): Admitting: Orthopaedic Surgery

## 2023-12-26 ENCOUNTER — Other Ambulatory Visit (INDEPENDENT_AMBULATORY_CARE_PROVIDER_SITE_OTHER)

## 2023-12-26 ENCOUNTER — Ambulatory Visit: Payer: Self-pay | Admitting: Urology

## 2023-12-26 DIAGNOSIS — M25511 Pain in right shoulder: Secondary | ICD-10-CM

## 2023-12-26 MED ORDER — CELECOXIB 200 MG PO CAPS: 200.0000 mg | ORAL_CAPSULE | Freq: Two times a day (BID) | ORAL | 1 refills | Status: DC | PRN

## 2023-12-26 MED ORDER — METHYLPREDNISOLONE ACETATE 40 MG/ML IJ SUSP
40.0000 mg | INTRAMUSCULAR | Status: AC | PRN
  Administered 2023-12-26: 40 mg via INTRA_ARTICULAR

## 2023-12-26 MED ORDER — LIDOCAINE HCL 1 % IJ SOLN
3.0000 mL | INTRAMUSCULAR | Status: AC | PRN
  Administered 2023-12-26: 3 mL

## 2023-12-26 NOTE — Progress Notes (Signed)
 The patient is a 73 year old gentleman who has a remote history of rotator cuff surgery back in 2012 of both of the shoulders.  Over the last month he developed some right shoulder pain with no known injury.  He reports some decreased motion in that shoulder as a result.  Reaching behind him and overhead has been somewhat painful.  He is a prediabetic.  He has had no other acute changes in medical status.  On examination of his right shoulder he does abduct and forward flex but lacks terminal forward flexion and abduction by at least 5 degrees.  With reaching behind him with internal rotation combined with adduction he can only reach to the lower lumbar spine.  It is definitely a stiff shoulder.  3 views of the right shoulder shows well located.  The humeral head is not high riding.  I did recommend a steroid injection in the right shoulder subacromial outlet and starting him on Celebrex as an anti-inflammatory to see if this would help calm down the symptoms.  We did offer him outpatient physical therapy but he wants to try to get this moving on his own.  I showed him exercises to try to really get that shoulder moving.  Follow-up can be as needed but if he is not getting better after a month he knows to reach back out to us .  All questions and concerns were addressed and answered.  He did tolerate the steroid injection well in his right shoulder subacromial outlet.    Procedure Note  Patient: Troy Boyer             Date of Birth: 09/22/1950           MRN: [id number]             Visit Date: 12/26/2023  Procedures: Visit Diagnoses:  1. Acute pain of right shoulder     Large Joint Inj: R subacromial bursa on 12/26/2023 2:44 PM Indications: pain and diagnostic evaluation Details: 22 G 1.5 in needle  Arthrogram: No  Medications: 3 mL lidocaine  1 %; 40 mg methylPREDNISolone acetate 40 MG/ML Outcome: tolerated well, no immediate complications Procedure, treatment alternatives, risks and  benefits explained, specific risks discussed. Consent was given by the patient. Immediately prior to procedure a time out was called to verify the correct patient, procedure, equipment, support staff and site/side marked as required. Patient was prepped and draped in the usual sterile fashion.

## 2023-12-27 ENCOUNTER — Ambulatory Visit (HOSPITAL_BASED_OUTPATIENT_CLINIC_OR_DEPARTMENT_OTHER)
Admission: RE | Admit: 2023-12-27 | Discharge: 2023-12-27 | Disposition: A | Discharge status: Home / Self Care | Source: Ambulatory Visit | Attending: Cardiology | Admitting: Cardiology

## 2023-12-27 DIAGNOSIS — I059 Rheumatic mitral valve disease, unspecified: Secondary | ICD-10-CM | POA: Insufficient documentation

## 2023-12-27 DIAGNOSIS — I340 Nonrheumatic mitral (valve) insufficiency: Secondary | ICD-10-CM

## 2023-12-27 DIAGNOSIS — I2510 Atherosclerotic heart disease of native coronary artery without angina pectoris: Secondary | ICD-10-CM | POA: Insufficient documentation

## 2023-12-28 ENCOUNTER — Ambulatory Visit: Payer: Self-pay | Admitting: Cardiology

## 2023-12-28 LAB — ECHOCARDIOGRAM COMPLETE
AR max vel: 2.02 cm2
AV Area VTI: 2.34 cm2
AV Area mean vel: 2.39 cm2
AV Mean grad: 6 mmHg
AV Peak grad: 14.4 mmHg
Ao pk vel: 1.9 m/s
Area-P 1/2: 3.19 cm2
MV M vel: 4.88 m/s
MV Peak grad: 95.3 mmHg
P 1/2 time: 79 ms
S' Lateral: 2 cm
Single Plane A2C EF: 78.6 %
Single Plane A4C EF: 75.2 %

## 2024-01-04 ENCOUNTER — Encounter: Payer: Self-pay | Admitting: Urology

## 2024-01-04 ENCOUNTER — Ambulatory Visit: Admitting: Urology

## 2024-01-04 VITALS — BP 119/68 | HR 80 | Ht 64.0 in | Wt 180.0 lb

## 2024-01-04 DIAGNOSIS — N401 Enlarged prostate with lower urinary tract symptoms: Secondary | ICD-10-CM

## 2024-01-04 DIAGNOSIS — E291 Testicular hypofunction: Secondary | ICD-10-CM

## 2024-01-04 DIAGNOSIS — N138 Other obstructive and reflux uropathy: Secondary | ICD-10-CM

## 2024-01-04 DIAGNOSIS — N529 Male erectile dysfunction, unspecified: Secondary | ICD-10-CM

## 2024-01-04 NOTE — Addendum Note (Signed)
 Addended by: OBADIAH ROSELEE RAMAN on: 01/04/2024 03:33 PM   Modules accepted: Orders

## 2024-01-04 NOTE — Progress Notes (Signed)
 Assessment: 1. BPH with obstruction/lower urinary tract symptoms   2. Organic impotence   3. Hypogonadism in male     Plan: I discussed possible trial of alternative medication for his lower urinary tract symptoms.  He would like to continue with his current treatment. Continue tadalafil  5 mg daily. Continue Gemtesa  75 mg daily. Continue Androgel  1.62% 4 pumps/day  Labs in 6 months: Testosterone , CBC, PSA Return to office in 6 months  Chief Complaint:  Chief Complaint  Patient presents with   Benign Prostatic Hypertrophy    History of Present Illness:  Troy Boyer is a 73 y.o. male who is seen for continued evaluation of BPH with lower urinary tract symptoms, hypogonadism, and erectile dysfunction. He had previously been followed for these urologic issues at [facility] by Dr. Devere.  His last visit there was in February 2025.  BPH with LUTS: He has recently been managed with tadalafil  5 mg daily and Myrbetriq  50 mg daily.  He reported his urinary symptoms were stable and his visit on 08/18/2023. PSA from 2/25: 3.39 He reported that the Myrbetriq  was not controlling his urgency and urge incontinence.  He was having frequency voiding every 2 hours and nocturia x 3 and requiring the use of adult briefs for his incontinence.  No dysuria or gross hematuria. IPSS = 17/5. PVR = 143 ml. He was given a trial of Gemtesa  in place of Myrbetriq  in April 2025.  At his visit in May 2025, he reported some improvement in his symptoms with the Gemtesa .  He felt like this worked better than the Myrbetriq .  However, he continued to have urgency and urge incontinence requiring use of adult briefs.  No side effects from the medication.  No dysuria or gross hematuria. IPSS = 16/4. PVR = 86 mL  He continues on daily tadalafil  and Gemtesa .  He continues with symptoms of urgency, frequency, and nocturia x 3.  No dysuria or gross hematuria.   IPSS = 13/5.  ED: He has been managed with  tadalafil  5 mg daily with good results. He continues to have satisfactory results with the daily tadalafil .  Hypogonadism: He was previously on Clomid but this was discontinued due to cost of the medication and lack of insurance coverage for the medication.  He noted a return of his hypogonadal symptoms after stopping the Clomid.   Testosterone  2/25: 283.5 He had resumed the Clomid and noted symptomatic benefit.  He was interested in alternative therapies for management of his hypogonadism. He was started on topical testosterone  1.62% 4 pumps/day in May 2025.  Labs from 6/25: Testosterone  498 H&H  13.2/42.1  He continues on testosterone  gel 1.60% 4 pumps/day.  He has noted significant symptomatic improvement with this medication.  No side effects.  He is very pleased with this therapy.   Portions of the above documentation were copied from a prior visit for review purposes only.   Past Medical History:  Past Medical History:  Diagnosis Date   Adenomatous colon polyp    tubular   CAD (coronary artery disease)    a. Cath 06/08/01 at [facility] with normal LM and LAD, 95% LCx s/p Circumflex stent 4.0 x 15 mm Penta and 85% and 80% prox RCA s/p 3.5 x 23 mm Penta b. cath 01/20/2015 95% prox LCx ISR treated with DES, 55% prox to mid RCA, 45% distal RCA, 40% midLAD, EF 55%     Cataract    Diabetes mellitus without complication (HCC)    Diarrhea  Diverticulosis    Erectile dysfunction    Gallstones    Hyperlipidemia    Hypertension    Myocardial infarction [facility])     Past Surgical History:  Past Surgical History:  Procedure Laterality Date   ANGIOPLASTY  2002   2 stents   BALLOON DILATION N/A 08/27/2023   Procedure: BALLOON DILATION;  Surgeon: Abran Norleen SAILOR, MD;  Location: [facility];  Service: Gastroenterology;  Laterality: N/A;   CARDIAC CATHETERIZATION N/A 01/20/2015   Procedure: Left Heart Cath and Coronary Angiography;  Surgeon: Victory LELON Sharps, MD;  Location: [facility] INVASIVE CV  LAB;  Service: Cardiovascular;  Laterality: N/A;   CARDIAC CATHETERIZATION N/A 01/27/2016   Procedure: Left Heart Cath and Coronary Angiography;  Surgeon: Ozell Fell, MD;  Location: [facility] INVASIVE CV LAB;  Service: Cardiovascular;  Laterality: N/A;   CHOLECYSTECTOMY N/A 02/09/2016   Procedure: LAPAROSCOPIC CHOLECYSTECTOMY WITH INTRAOPERATIVE CHOLANGIOGRAM;  Surgeon: Debby Shipper, MD;  Location: [facility] OR;  Service: General;  Laterality: N/A;   COLONOSCOPY W/ POLYPECTOMY  08/2013   Avg risk screening, Dr Arlester Aho. 5 mm tubular adenoma ascending.  Mild to moderated descending and sigmoid tics.  Internal hemorrhoids   CORONARY ANGIOPLASTY WITH STENT PLACEMENT     ERCP N/A 02/13/2016   Procedure: ENDOSCOPIC RETROGRADE CHOLANGIOPANCREATOGRAPHY (ERCP);  Surgeon: Victory LITTIE Legrand DOUGLAS, MD;  Location: [facility] ENDOSCOPY;  Service: Gastroenterology;  Laterality: N/A;   ERCP N/A 08/27/2023   Procedure: ENDOSCOPIC RETROGRADE CHOLANGIOPANCREATOGRAPHY (ERCP);  Surgeon: Abran Norleen SAILOR, MD;  Location: [facility];  Service: Gastroenterology;  Laterality: N/A;   REMOVAL OF STONES  08/27/2023   Procedure: REMOVAL OF STONES;  Surgeon: Abran Norleen SAILOR, MD;  Location: [facility];  Service: Gastroenterology;;   ROTATOR CUFF REPAIR Right 2009   ROTATOR CUFF REPAIR Left 2014   SPHINCTEROTOMY  08/27/2023   Procedure: SPHINCTEROTOMY;  Surgeon: Abran Norleen SAILOR, MD;  Location: [facility];  Service: Gastroenterology;;   TRIGGER FINGER RELEASE Bilateral 2013   4 fingers, middle finger on both hands    Allergies:  Allergies  Allergen Reactions   Other Rash    ECG electrodes, per patient   Protonix  [Pantoprazole ] Other (See Comments)    Exacerbated reflux s/s's    Family History:  Family History  Problem Relation Age of Onset   Dementia Mother    Crohn's disease Mother    Heart disease Father 70       Valve replacement and CAD, CABG   Diabetes Father    CAD Sister 67   CAD Brother 36       Died age 88   Diabetes Brother     Kidney disease Brother    Colon cancer Neg Hx    Esophageal cancer Neg Hx    Stomach cancer Neg Hx    Rectal cancer Neg Hx    Pancreatic cancer Neg Hx     Social History:  Social History   Tobacco Use   Smoking status: Former    Current packs/day: 0.00    Average packs/day: 1 pack/day for 45.0 years (45.0 ttl pk-yrs)    Types: Cigarettes    Start date: 01/16/1970    Quit date: 01/17/2015    Years since quitting: 8.9   Smokeless tobacco: Never  Vaping Use   Vaping status: Never Used  Substance Use Topics   Alcohol use: Not Currently    Alcohol/week: 1.0 standard drink of alcohol    Types: 1 Glasses of wine per week    Comment: Previously 1 large  vodka tonic per day Quit 06/2023   Drug use: No    ROS: Constitutional:  Negative for fever, chills, weight loss CV: Negative for chest pain, previous MI, hypertension Respiratory:  Negative for shortness of breath, wheezing, sleep apnea, frequent cough GI:  Negative for nausea, vomiting, bloody stool, GERD  Physical exam: There were no vitals taken for this visit. GENERAL APPEARANCE:  Well appearing, well developed, well nourished, NAD HEENT:  Atraumatic, normocephalic, oropharynx clear NECK:  Supple without lymphadenopathy or thyromegaly ABDOMEN:  Soft, non-tender, no masses EXTREMITIES:  Moves all extremities well, without clubbing, cyanosis, or edema NEUROLOGIC:  Alert and oriented x 3, normal gait, CN II-XII grossly intact MENTAL STATUS:  appropriate BACK:  Non-tender to palpation, No CVAT SKIN:  Warm, dry, and intact  Results: U/A:

## 2024-01-25 ENCOUNTER — Encounter: Payer: Self-pay | Admitting: Orthopaedic Surgery

## 2024-01-25 ENCOUNTER — Other Ambulatory Visit: Payer: Self-pay

## 2024-01-25 ENCOUNTER — Ambulatory Visit: Admitting: Orthopaedic Surgery

## 2024-01-25 DIAGNOSIS — M25511 Pain in right shoulder: Secondary | ICD-10-CM | POA: Diagnosis not present

## 2024-01-25 NOTE — Progress Notes (Signed)
 The patient is continue to have significant right shoulder issues with decreased motion even with conservative treatment for his right shoulder.  He 73 years old.  He has been through physical therapy and had a steroid injection in the subacromial outlet.  He reports shoulder surgery on the rotator cuff about 20 years ago.  On exam he still has a lot of pain with and to can test as well as reaching behind him is quite difficult.  The shoulder is weak and painful.  Previous x-rays of the right shoulder showed no acute findings.  Given the overwhelming failure conservative treatment for over 6 months with his right shoulder, a MRI is warranted to assess the rotator cuff and the cartilage within the shoulder to help determine what the next treatment plan may be.  We will see him back in follow-up once we have the MRI of his right shoulder.  He agrees with the treatment plan.

## 2024-01-29 ENCOUNTER — Ambulatory Visit (HOSPITAL_BASED_OUTPATIENT_CLINIC_OR_DEPARTMENT_OTHER)

## 2024-01-30 ENCOUNTER — Other Ambulatory Visit: Payer: Self-pay | Admitting: Family Medicine

## 2024-01-30 NOTE — Telephone Encounter (Signed)
 Copied from CRM [id number]. Topic: Clinical - Medication Refill >> Jan 30, 2024  2:56 PM Rosina D wrote: Medication: metformin  (if the patient does not have his medication on time he want to know will it cause any health issues)  Has the patient contacted their pharmacy? Yes (Agent: If no, request that the patient contact the pharmacy for the refill. If patient does not wish to contact the pharmacy document the reason why and proceed with request.) (Agent: If yes, when and what did the pharmacy advise?)  This is the patient's preferred pharmacy:  [facility] - [place], [place] - [address] [place] [address] Phone: [phone] Fax: [phone]  Is this the correct pharmacy for this prescription? Yes If no, delete pharmacy and type the correct one.   Has the prescription been filled recently? No  Is the patient out of the medication? Yes  Has the patient been seen for an appointment in the last year OR does the patient have an upcoming appointment? Yes  Can we respond through MyChart? Yes  Agent: Please be advised that Rx refills may take up to 3 business days. We ask that you follow-up with your pharmacy.

## 2024-02-01 ENCOUNTER — Ambulatory Visit (HOSPITAL_BASED_OUTPATIENT_CLINIC_OR_DEPARTMENT_OTHER)
Admission: RE | Admit: 2024-02-01 | Discharge: 2024-02-01 | Disposition: A | Discharge status: Home / Self Care | Source: Ambulatory Visit | Attending: Physician Assistant | Admitting: Physician Assistant

## 2024-02-01 DIAGNOSIS — M25511 Pain in right shoulder: Secondary | ICD-10-CM | POA: Diagnosis not present

## 2024-02-01 DIAGNOSIS — S46811A Strain of other muscles, fascia and tendons at shoulder and upper arm level, right arm, initial encounter: Secondary | ICD-10-CM | POA: Diagnosis not present

## 2024-02-01 DIAGNOSIS — M75111 Incomplete rotator cuff tear or rupture of right shoulder, not specified as traumatic: Secondary | ICD-10-CM | POA: Diagnosis not present

## 2024-02-01 DIAGNOSIS — M19011 Primary osteoarthritis, right shoulder: Secondary | ICD-10-CM | POA: Diagnosis not present

## 2024-02-01 DIAGNOSIS — M7581 Other shoulder lesions, right shoulder: Secondary | ICD-10-CM | POA: Diagnosis not present

## 2024-02-02 ENCOUNTER — Telehealth: Payer: Self-pay | Admitting: Cardiology

## 2024-02-02 ENCOUNTER — Telehealth: Payer: Self-pay | Admitting: Physician Assistant

## 2024-02-02 DIAGNOSIS — E7800 Pure hypercholesterolemia, unspecified: Secondary | ICD-10-CM

## 2024-02-02 MED ORDER — EZETIMIBE 10 MG PO TABS: 10.0000 mg | ORAL_TABLET | Freq: Every day | ORAL | 2 refills | Status: AC

## 2024-02-02 NOTE — Telephone Encounter (Signed)
 Pt called stating he received a call from [facility] concerning MRI results. Pt state if he can go over the phone with Gretta about MRI. Pt did make an appt for 8/25 but if he don't have to wait that long and have a phone call. Please call pt at [phone].

## 2024-02-02 NOTE — Telephone Encounter (Signed)
*  STAT* If patient is at the pharmacy, call can be transferred to refill team.   1. Which medications need to be refilled? (please list name of each medication and dose if known) ezetimibe  (ZETIA ) 10 MG tablet    2. Would you like to learn more about the convenience, safety, & potential cost savings by using the [facility]? No   3. Are you open to using the [facility] (Type [facility]. ) No   4. Which pharmacy/location (including street and city if local pharmacy) is medication to be sent to? [facility] - [place], [place] - [address]    5. Do they need a 30 day or 90 day supply? 90 day  Pt is out of medication and had f/u on 5/23

## 2024-02-02 NOTE — Telephone Encounter (Signed)
 RX sent in

## 2024-02-06 ENCOUNTER — Telehealth: Payer: Self-pay | Admitting: Physician Assistant

## 2024-02-06 ENCOUNTER — Telehealth: Payer: Self-pay | Admitting: Neurology

## 2024-02-06 ENCOUNTER — Encounter: Payer: Self-pay | Admitting: Neurology

## 2024-02-06 ENCOUNTER — Ambulatory Visit: Admitting: Neurology

## 2024-02-06 VITALS — BP 122/58 | HR 84 | Ht 64.0 in | Wt 186.0 lb

## 2024-02-06 DIAGNOSIS — K8309 Other cholangitis: Secondary | ICD-10-CM | POA: Diagnosis not present

## 2024-02-06 DIAGNOSIS — R2681 Unsteadiness on feet: Secondary | ICD-10-CM | POA: Diagnosis not present

## 2024-02-06 DIAGNOSIS — Z720 Tobacco use: Secondary | ICD-10-CM | POA: Diagnosis not present

## 2024-02-06 DIAGNOSIS — R413 Other amnesia: Secondary | ICD-10-CM | POA: Insufficient documentation

## 2024-02-06 DIAGNOSIS — I200 Unstable angina: Secondary | ICD-10-CM | POA: Diagnosis not present

## 2024-02-06 MED ORDER — MEMANTINE HCL 5 MG PO TABS: 5.0000 mg | ORAL_TABLET | Freq: Two times a day (BID) | ORAL | 5 refills | Status: DC

## 2024-02-06 NOTE — Progress Notes (Addendum)
 SLEEP [facility]INE [facility]    Provider:  Dedra Gores, MD  Primary Care Physician:  Frann Mabel Mt, DO [address] [place] [address]     Referring Provider: Frann Mabel Mt Ros[place]a [address] [place],  [place] [address]          Chief Complaint according to patient   Patient presents with:     New Patient (Initial Visit)           HISTORY OF PRESENT ILLNESS:  Troy Boyer is a 73 y.o. male patient who is seen upon PCP  referral on 02/06/2024 ;  Chief concern according to patient :  Pt with wife. Room 2. She states over the last 7 years she has noticed a steady decline in memory. More so with his short term memory. Pt states that he has always had difficulty with short term memory.      Memory relevant medical history:  TIA, No CVA ,  CAD. MI ( 2016) , prolonged surgery - under anesthesia.  Shoulder pain, rotator cuff surgery bilaterally , cardiac stents, 2018 - gallbladder 02-06-2016- and then again 2025  gangrene, bile duct disease, liver lesion at Age 74. DM. Anemia.   In February he has sepsis and encephalopathy.  Could not walk after that, recovered incompletely.    He drive into a gait in 7982 at work.   07-30-2023 ;ED visit Troy Boyer is a 73 y.o. male.   Patient is a 73 year old male with past medical history of coronary artery disease with stents, hypertension, hyperlipidemia, type 2 diabetes, cholecystectomy.  Patient presenting today with complaints of weakness and fatigue.  This has been ongoing for the past 2 days.  Wife states that he has had little energy and has not gotten out of bed.  He denies any chest pain or difficulty breathing.  No fevers or chills, but did develop a fever here in the ER.  No aggravating or alleviating factors.    Family medical history: Mother had a stroke and  Gamma Knife  for benign brain tumor, developed dementia many years after, with the  death of her husband.  Father died at age  18, she died  at 44.    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  Family status is common law spouses , with 2  biological adult children, 4 grandchildren.  The patient  used to work as a Programme researcher, broadcasting/film/video,  he travelled [place].   Living with pets : 3 cats.   Tobacco use; quit in 2018 .   ETOH use :  1 drink each night  Caffeine intake in form of Coffee( 1 cup in AM ) Soda( 3/ week ) Tea ( / arnold palmer  1 glass a day) or energy drinks Exercise in form of ; limited.  He has gait problems.  Walking is limited, but a safety concern too.          Review of Systems: Out of a complete 14 system review, the patient complains of only the following symptoms, and all other reviewed systems are negative.:  Fatigue, sleepiness , snoring, fragmented sleep,    Calculation  done by note block and ink pen.          02/06/2024    1:59 PM  MMSE - Mini Mental State Exam  Orientation to time 5  Orientation to Place  5  Registration 3  Attention/ Calculation 3  Recall 0  Language- name 2 objects 2  Language- repeat 1  Language- follow 3 step command 3  Language- read & follow direction 1  Write a sentence 1  Copy design 1  Total score 25        02/06/2024    1:51 PM  Montreal Cognitive Assessment   Visuospatial/ Executive (0/5) 2  Naming (0/3) 3  Attention: Read list of digits (0/2) 1  Attention: Read list of letters (0/1) 1  Attention: Serial 7 subtraction starting at 100 (0/3) 1  Language: Repeat phrase (0/2) 2  Language : Fluency (0/1) 0  Abstraction (0/2) 2  Delayed Recall (0/5) 2  Orientation (0/6) 6  Total 20     Social History   Socioeconomic History   Marital status: Significant Other    Spouse name: Not on file   Number of children: 2   Years of education: 18   Highest education level: Bachelor's degree (e.g., BA, AB, BS)  Occupational History   Occupation: Airline pilot- retired in 2018    Employer:  [employer]  Tobacco Use   Smoking status: Former    Current packs/day: 0.00    Average packs/day: 1 pack/day for 45.0 years (45.0 ttl pk-yrs)    Types: Cigarettes    Start date: 01/16/1970    Quit date: 01/17/2015    Years since quitting: 9.0   Smokeless tobacco: Never  Vaping Use   Vaping status: Never Used  Substance and Sexual Activity   Alcohol use: Not Currently    Alcohol/week: 1.0 standard drink of alcohol    Types: 1 Glasses of wine per week    Comment: Previously 1 large vodka tonic per day Quit 06/2023   Drug use: No   Sexual activity: Yes  Other Topics Concern   Not on file  Social History Narrative   Fun: Boat, golf   Denies religious beliefs effecting health care.    Social Drivers of Corporate investment banker Strain: Low Risk  (08/20/2023)   Overall Financial Resource Strain (CARDIA)    Difficulty of Paying Living Expenses: Not hard at all  Food Insecurity: No Food Insecurity (09/01/2023)   Hunger Vital Sign    Worried About Running Out of Food in the Last Year: Never true    Ran Out of Food in the Last Year: Never true  Transportation Needs: No Transportation Needs (09/01/2023)   PRAPARE - Administrator, Civil Service (Medical): No    Lack of Transportation (Non-Medical): No  Physical Activity: Inactive (08/20/2023)   Exercise Vital Sign    Days of Exercise per Week: 0 days    Minutes of Exercise per Session: 90 min  Stress: Stress Concern Present (08/20/2023)   [employer] of Occupational Health - Occupational Stress Questionnaire    Feeling of Stress : To some extent  Social Connections: Socially Integrated (08/27/2023)   Social Connection and Isolation Panel    Frequency of Communication with Friends and Family: Twice a week    Frequency of Social Gatherings with Friends and Family: Once a week    Attends Religious Services: More than 4 times per year    Active Member of [employer] or Organizations: No    Attends Hospital doctor: More than 4 times per year    Marital Status: Living with partner    Family History  Problem Relation Age of Onset   Dementia Mother  Crohn's disease Mother    Heart disease Father 64       Valve replacement and CAD, CABG   Diabetes Father    CAD Sister 58   CAD Brother 35       Died age 34   Diabetes Brother    Kidney disease Brother    Colon cancer Neg Hx    Esophageal cancer Neg Hx    Stomach cancer Neg Hx    Rectal cancer Neg Hx    Pancreatic cancer Neg Hx     Past Medical History:  Diagnosis Date   Adenomatous colon polyp    tubular   CAD (coronary artery disease)    a. Cath 06/08/01 at [facility] with normal LM and LAD, 95% LCx s/p Circumflex stent 4.0 x 15 mm Penta and 85% and 80% prox RCA s/p 3.5 x 23 mm Penta b. cath 01/20/2015 95% prox LCx ISR treated with DES, 55% prox to mid RCA, 45% distal RCA, 40% midLAD, EF 55%     Cataract    Diabetes mellitus without complication (HCC)    Diarrhea    Diverticulosis    Erectile dysfunction    Gallstones    Hyperlipidemia    Hypertension    Myocardial infarction [facility])     Past Surgical History:  Procedure Laterality Date   ANGIOPLASTY  2002   2 stents   BALLOON DILATION N/A 08/27/2023   Procedure: BALLOON DILATION;  Surgeon: Abran Norleen SAILOR, MD;  Location: [facility];  Service: Gastroenterology;  Laterality: N/A;   CARDIAC CATHETERIZATION N/A 01/20/2015   Procedure: Left Heart Cath and Coronary Angiography;  Surgeon: Victory LELON Sharps, MD;  Location: [facility] INVASIVE CV LAB;  Service: Cardiovascular;  Laterality: N/A;   CARDIAC CATHETERIZATION N/A 01/27/2016   Procedure: Left Heart Cath and Coronary Angiography;  Surgeon: Ozell Fell, MD;  Location: [facility] INVASIVE CV LAB;  Service: Cardiovascular;  Laterality: N/A;   CHOLECYSTECTOMY N/A 02/09/2016   Procedure: LAPAROSCOPIC CHOLECYSTECTOMY WITH INTRAOPERATIVE CHOLANGIOGRAM;  Surgeon: Debby Shipper, MD;  Location: [facility] OR;  Service: General;  Laterality: N/A;   COLONOSCOPY  W/ POLYPECTOMY  08/2013   Avg risk screening, Dr Arlester Aho. 5 mm tubular adenoma ascending.  Mild to moderated descending and sigmoid tics.  Internal hemorrhoids   CORONARY ANGIOPLASTY WITH STENT PLACEMENT     ERCP N/A 02/13/2016   Procedure: ENDOSCOPIC RETROGRADE CHOLANGIOPANCREATOGRAPHY (ERCP);  Surgeon: Victory LITTIE Legrand DOUGLAS, MD;  Location: [facility] END[facility]COPY;  Service: Gastroenterology;  Laterality: N/A;   ERCP N/A 08/27/2023   Procedure: ENDOSCOPIC RETROGRADE CHOLANGIOPANCREATOGRAPHY (ERCP);  Surgeon: Abran Norleen SAILOR, MD;  Location: [facility];  Service: Gastroenterology;  Laterality: N/A;   REMOVAL OF STONES  08/27/2023   Procedure: REMOVAL OF STONES;  Surgeon: Abran Norleen SAILOR, MD;  Location: [facility];  Service: Gastroenterology;;   ROTATOR CUFF REPAIR Right 2009   ROTATOR CUFF REPAIR Left 2014   SPHINCTEROTOMY  08/27/2023   Procedure: SPHINCTEROTOMY;  Surgeon: Abran Norleen SAILOR, MD;  Location: [facility];  Service: Gastroenterology;;   AVA FINGER RELEASE Bilateral 2013   4 fingers, middle finger on both hands     Current Outpatient Medications on File Prior to Visit  Medication Sig Dispense Refill   amLODipine  (NORVASC ) 5 MG tablet Take 1 tablet (5 mg total) by mouth daily. 90 tablet 1   atorvastatin  (LIPITOR ) 80 MG tablet Take 1 tablet (80 mg total) by mouth at bedtime. 90 tablet 1   celecoxib  (CELEBREX ) 200 MG capsule Take 1 capsule (200  mg total) by mouth 2 (two) times daily between meals as needed. 60 capsule 1   ezetimibe  (ZETIA ) 10 MG tablet Take 1 tablet (10 mg total) by mouth daily. 90 tablet 2   fenofibrate  (TRICOR ) 48 MG tablet TAKE 1 TABLET BY MOUTH DAILY 90 tablet 3   glucose blood (ACCU-CHEK GUIDE) test strip Use daily to check blood sugar.  DX E11.65 100 each 3   Krill Oil 500 MG CAPS Take 500 mg by mouth 2 (two) times daily.     metFORMIN  (GLUCOPHAGE -XR) 500 MG 24 hr tablet TAKE 2 TABLETS BY MOUTH TWICE  DAILY WITH FOOD 360 tablet 3   metoprolol  tartrate (LOPRESSOR ) 25 MG tablet  TAKE 1 TABLET BY MOUTH TWICE  DAILY 180 tablet 3   Multiple Vitamin (MULTIVITAMIN WITH MINERALS) TABS tablet Take 1 tablet by mouth daily.     nitroGLYCERIN  (NITROSTAT ) 0.4 MG SL tablet Place 1 tablet (0.4 mg total) under the tongue every 5 (five) minutes as needed for chest pain. 25 tablet 3   pioglitazone  (ACTOS ) 45 MG tablet Take 1 tablet (45 mg total) by mouth daily. 90 tablet 3   ramipril  (ALTACE ) 10 MG capsule TAKE 1 CAPSULE BY MOUTH TWICE  DAILY 180 capsule 3   tadalafil  (CIALIS ) 5 MG tablet Take 1 tablet (5 mg total) by mouth daily. (Patient taking differently: Take 5 mg by mouth daily as needed for erectile dysfunction.) 30 tablet 11   Testosterone  (ANDROGEL ) 20.25 MG/1.25GM (1.62%) GEL Apply 4 Pump topically daily. 150 g 5   traZODone  (DESYREL ) 50 MG tablet Take 1 tablet (50 mg total) by mouth at bedtime as needed for sleep. 90 tablet 1   Vibegron  (GEMTESA ) 75 MG TABS Take 1 tablet (75 mg total) by mouth daily. 30 tablet 11   No current facility-administered medications on file prior to visit.    Allergies  Allergen Reactions   Other Rash    ECG electrodes, per patient   Protonix  [Pantoprazole ] Other (See Comments)    Exacerbated reflux s/s's     DIAGNOSTIC DATA (LABS, IMAGING, TESTING) - I reviewed patient records, labs, notes, testing and imaging myself where available.  Lab Results  Component Value Date   WBC 6.7 12/22/2023   HGB 13.2 12/22/2023   HCT 42.1 12/22/2023   MCV 95 12/22/2023   PLT 240 12/22/2023      Component Value Date/Time   NA 141 10/05/2023 1412   NA 141 05/28/2020 1056   K 4.1 10/05/2023 1412   CL 102 10/05/2023 1412   CO2 30 10/05/2023 1412   GLUCOSE 169 (H) 10/05/2023 1412   BUN 14 10/05/2023 1412   BUN 16 05/28/2020 1056   CREATININE 0.94 10/05/2023 1412   CREATININE 0.90 09/18/2014 0927   CALCIUM  9.6 10/05/2023 1412   PROT 6.9 11/11/2023 1105   PROT 6.7 05/28/2020 1056   ALBUMIN 4.3 11/11/2023 1105   ALBUMIN 4.7 05/28/2020 1056   AST  15 11/11/2023 1105   ALT 14 11/11/2023 1105   ALKPHOS 32 (L) 11/11/2023 1105   BILITOT 0.4 11/11/2023 1105   BILITOT 0.4 05/28/2020 1056   GFRNONAA >60 08/30/2023 0427   GFRNONAA >89 09/18/2014 0927   GFRAA 75 05/28/2020 1056   GFRAA >89 09/18/2014 0927   Lab Results  Component Value Date   CHOL 108 11/11/2023   HDL 46.50 11/11/2023   LDLCALC 46 11/11/2023   LDLDIRECT 74.0 05/18/2019   TRIG 78.0 11/11/2023   CHOLHDL 2 11/11/2023   Lab Results  Component  Value Date   HGBA1C 7.6 (H) 08/22/2023   Lab Results  Component Value Date   VITAMINB12 393 10/05/2023   Lab Results  Component Value Date   TSH 1.15 10/05/2023    PHYSICAL EXAM:  Today's Vitals   02/06/24 1346  BP: (!) 122/58  Pulse: 84  Weight: 186 lb (84.4 kg)  Height: 5' 4 (1.626 m)   Body mass index is 31.93 kg/m.   Wt Readings from Last 3 Encounters:  02/06/24 186 lb (84.4 kg)  01/04/24 180 lb (81.6 kg)  11/18/23 182 lb (82.6 kg)     Ht Readings from Last 3 Encounters:  02/06/24 5' 4 (1.626 m)  01/04/24 5' 4 (1.626 m)  11/18/23 5' 4 (1.626 m)      General: The patient is awake, alert and appears not in acute distress. The patient is well groomed. Head: Normocephalic, atraumatic. Neck is supple.  Mallampati , 3 plus.  neck circumference:18 inches . Nasal airflow patent.   Retrognathia is  seen.  Dental status: biological  Cardiovascular:  Regular rate and cardiac rhythm by pulse,  without distended neck veins. Respiratory: Lungs are clear to auscultation.  Skin:  Without evidence of ankle edema, or rash. Trunk: The patient's posture .   NEUROLOGIC EXAM: The patient is awake and alert, oriented to place and time.   Memory subjective described as intact.  Attention span & concentration ability appears limited, wife interjects a lot of additional information or corrects information.  Speech is fluent,  with dysphonia or aphasia.  Mood and affect are appropriate.   Cranial nerves: no loss  of smell or taste reported  Pupils are equal and briskly reactive to light. Funduscopic exam deferred.  Extraocular movements in vertical and horizontal planes were intact and without nystagmus. No Diplopia. Visual fields by finger perimetry are intact. Hearing was intact to soft voice and finger rubbing.    Facial sensation intact to fine touch.  Facial motor strength is symmetric and tongue and uvula move midline.  Neck ROM : rotation, tilt and flexion extension were normal for age and shoulder shrug was symmetrical.    Motor exam:  Symmetric bulk, tone and ROM.   Normal tone without cog -wheeling, asymmetric  ( weak ) grip strength, weakest on the left. .   Sensory:  Fine touch, vibration were not felt in the left foot.  Proprioception tested in the upper extremities was normal.   Coordination: Rapid alternating movements in the fingers/hands were of normal speed.  The Finger-to-nose maneuver was intact without evidence of ataxia, dysmetria or tremor.   Gait and station: Patient could rise unassisted from a seated position, walked without assistive device.  Stance is of wide base and the patient turned with 4-5 steps.  Toe and heel walk were deferred.   He cannot  perform a tandem gait.   Deep tendon reflexes: in the  upper extremities are symmetric and intact. Patella only elicited on the right  knee.  Babinski response was deferred .    ASSESSMENT AND PLAN 73 y.o. year old male  here with:  Decreasing cognitive abilities .  Patient reports being independent in all ADL. Wife did agree.     1) STM loss,   executive dysfunction. Naming and calculation affected    2) gait has changed into wide- based en -bloc movement with arm swing  and multi step turns.   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   Plan :  referral for neuropsychological testing.   [facility] brain. [facility].   PT for gait stability.  Patient declined.   ATN  and dementia panel.    Not using Aricept since he has already stool incontinence and diarrhea.   I plan to follow up either personally or through our NP within 5 months.   I would like to thank Frann Mabel Deward Rosalea [address] [place],  [place] [address] for allowing me to meet with and to take care of this pleasant patient.   Discussion of sleep hygiene setting bedtime and rise time,  hot shower  before bed time, no screen light in the bedroom, the bedroom should be cool, quiet and dark. Night lights should illuminate the floor not shine into your eyes. Golden glow  light is less intrusive than blue or cold light.  Read in a book with pages, not on a device. Consider audio books and soothing  sound -scapes.     After spending a total time of  45  minutes face to face and additional time for physical and neurologic examination, review of laboratory studies,  personal review of imaging studies, reports and results of other testing and review of referral information / records as far as provided in visit,   Electronically signed by: Dedra Gores, MD 02/06/2024 2:08 PM  [facility]. Board certified by [employer] of Sleep Medicine and Diplomate of the [employer] of Sleep Medicine. Board certified In Neurology through the ABPN, Fellow of the [employer] of Neurology.

## 2024-02-06 NOTE — Telephone Encounter (Signed)
 Patient called. He would like to know if someone could call him with the MRI results?

## 2024-02-06 NOTE — Telephone Encounter (Signed)
 Referral for physical therapy fax to [facility] [place]. Phone: [phone], Fax: [phone]

## 2024-02-06 NOTE — Patient Instructions (Addendum)
 Management of Memory Problems  There are some general things you can do to help manage your memory problems.  Your memory may not in fact recover, but by using techniques and strategies you will be able to manage your memory difficulties better.  1)  Establish a routine. Try to establish and then stick to a regular routine.  By doing this, you will get used to what to expect and you will reduce the need to rely on your memory.  Also, try to do things at the same time of day, such as taking your medication or checking your calendar first thing in the morning. Think about think that you can do as a part of a regular routine and make a list.  Then enter them into a daily planner to remind you.  This will help you establish a routine.  2)  Organize your environment. Organize your environment so that it is uncluttered.  Decrease visual stimulation.  Place everyday items such as keys or cell phone in the same place every day (ie.  Basket next to front door) Use post it notes with a brief message to yourself (ie. Turn off light, lock the door) Use labels to indicate where things go (ie. Which cupboards are for food, dishes, etc.) Keep a notepad and pen by the telephone to take messages  3)  Memory Aids A diary or journal/notebook/daily planner Making a list (shopping list, chore list, to do list that needs to be done) Using an alarm as a reminder (kitchen timer or cell phone alarm) Using cell phone to store information (Notes, Calendar, Reminders) Calendar/White board placed in a prominent position Post-it notes  In order for memory aids to be useful, you need to have good habits.  It's no good remembering to make a note in your journal if you don't remember to look in it.  Try setting aside a certain time of day to look in journal.  4)  Improving mood and managing fatigue. There may be other factors that contribute to memory difficulties.  Factors, such as anxiety, depression and tiredness can  affect memory. Regular gentle exercise can help improve your mood and give you more energy. Simple relaxation techniques may help relieve symptoms of anxiety Try to get back to completing activities or hobbies you enjoyed doing in the past. Learn to pace yourself through activities to decrease fatigue. Find out about some local support groups where you can share experiences with others. Try and achieve 7-8 hours of sleep at night. ASSESSMENT AND PLAN 73 y.o. year old male  here with:     1) STM loss,   executive dysfunction. Naming and calculation affected , time frame disturbance.      2) gait has changed into wide- based en -bloc movement with arm swing  and multi step turns.    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  He is  less patient.    Plan :   referral for neuropsychological testing.    [facility] brain. [facility].    PT for gait stability.  Patient declined.    ATN and dementia panel.    I plan to follow up either personally or through our NP within 5 months.    I would like to thank Frann Mabel Deward Rosalea [address] [place],  [place] [address] for allowing me to meet with and to take care of this pleasant patient.

## 2024-02-07 ENCOUNTER — Ambulatory Visit: Payer: Self-pay | Admitting: Neurology

## 2024-02-07 DIAGNOSIS — Z720 Tobacco use: Secondary | ICD-10-CM

## 2024-02-07 DIAGNOSIS — R413 Other amnesia: Secondary | ICD-10-CM

## 2024-02-07 DIAGNOSIS — R2681 Unsteadiness on feet: Secondary | ICD-10-CM

## 2024-02-07 DIAGNOSIS — I200 Unstable angina: Secondary | ICD-10-CM

## 2024-02-07 NOTE — Telephone Encounter (Signed)
 Our form for PT referrals includes if we are comfortable with the use of  steroids I or iontophoresis- yes we are , but we done order it. Its upon the PT assessment and if PT wants to use it, we allow for it.   No need to send the patient to the pharmacy.   Patient's wife would like him to have Pt, I like him to have PT , he is hesitant. I sent a MyChart Message asking him to please go to PT for gait and balance.

## 2024-02-07 NOTE — Telephone Encounter (Signed)
 [facility]) called for clarification for referral. Asking if patient getting Ionotophoresis-4 mg/ml of dexamethasone  if so what do we use it on and would need to send in to the pharmacy, [facility] in [place] (closest to [place]) and patient bring with him to the appointment.

## 2024-02-08 ENCOUNTER — Telehealth: Payer: Self-pay | Admitting: Neurology

## 2024-02-08 NOTE — Telephone Encounter (Signed)
 Refax referral for physical therapy to [facility]. Phone: [phone], Fax: [phone]

## 2024-02-08 NOTE — Telephone Encounter (Signed)
Patient scheduled 8/22.

## 2024-02-08 NOTE — Telephone Encounter (Signed)
 no auth required sent to [employer] [phone]

## 2024-02-09 LAB — PROTEIN ELECTROPHORESIS, SERUM
A/G Ratio: 1.5 (ref 0.7–1.7)
Albumin ELP: 3.8 g/dL (ref 2.9–4.4)
Alpha 1: 0.2 g/dL (ref 0.0–0.4)
Alpha 2: 0.8 g/dL (ref 0.4–1.0)
Beta: 1 g/dL (ref 0.7–1.3)
Gamma Globulin: 0.6 g/dL (ref 0.4–1.8)
Globulin, Total: 2.6 g/dL (ref 2.2–3.9)

## 2024-02-09 LAB — CBC WITH DIFFERENTIAL/PLATELET
Basophils Absolute: 0 x10E3/uL (ref 0.0–0.2)
Basos: 1 %
EOS (ABSOLUTE): 0.1 x10E3/uL (ref 0.0–0.4)
Eos: 1 %
Hematocrit: 42.5 % (ref 37.5–51.0)
Hemoglobin: 13.8 g/dL (ref 13.0–17.7)
Immature Grans (Abs): 0 x10E3/uL (ref 0.0–0.1)
Immature Granulocytes: 0 %
Lymphocytes Absolute: 1.9 x10E3/uL (ref 0.7–3.1)
Lymphs: 28 %
MCH: 30.3 pg (ref 26.6–33.0)
MCHC: 32.5 g/dL (ref 31.5–35.7)
MCV: 93 fL (ref 79–97)
Monocytes Absolute: 0.8 x10E3/uL (ref 0.1–0.9)
Monocytes: 12 %
Neutrophils Absolute: 3.8 x10E3/uL (ref 1.4–7.0)
Neutrophils: 58 %
Platelets: 262 x10E3/uL (ref 150–450)
RBC: 4.55 x10E6/uL (ref 4.14–5.80)
RDW: 12.8 % (ref 11.6–15.4)
WBC: 6.7 x10E3/uL (ref 3.4–10.8)

## 2024-02-09 LAB — COMPREHENSIVE METABOLIC PANEL WITH GFR
ALT: 20 IU/L (ref 0–44)
AST: 22 IU/L (ref 0–40)
Albumin: 4.4 g/dL (ref 3.8–4.8)
Alkaline Phosphatase: 48 IU/L (ref 44–121)
BUN/Creatinine Ratio: 15 (ref 10–24)
BUN: 19 mg/dL (ref 8–27)
Bilirubin Total: 0.5 mg/dL (ref 0.0–1.2)
CO2: 23 mmol/L (ref 20–29)
Calcium: 9.5 mg/dL (ref 8.6–10.2)
Chloride: 102 mmol/L (ref 96–106)
Creatinine, Ser: 1.31 mg/dL — ABNORMAL HIGH (ref 0.76–1.27)
Globulin, Total: 2 g/dL (ref 1.5–4.5)
Glucose: 205 mg/dL — ABNORMAL HIGH (ref 70–99)
Potassium: 4.4 mmol/L (ref 3.5–5.2)
Sodium: 140 mmol/L (ref 134–144)
Total Protein: 6.4 g/dL (ref 6.0–8.5)
eGFR: 58 mL/min/1.73 — ABNORMAL LOW (ref >59)

## 2024-02-09 LAB — ATN PROFILE
A -- Beta-amyloid 42/40 Ratio: 0.094 — ABNORMAL LOW (ref >0.102)
Beta-amyloid 40: 265.52 pg/mL
Beta-amyloid 42: 24.91 pg/mL
N -- NfL, Plasma: 2.57 pg/mL (ref 0.00–6.04)
T -- p-tau181: 0.81 pg/mL (ref 0.00–0.97)

## 2024-02-09 LAB — TSH+FREE T4
Free T4: 1.3 ng/dL (ref 0.82–1.77)
TSH: 0.892 u[IU]/mL (ref 0.450–4.500)

## 2024-02-09 LAB — HEMOGLOBIN A1C
Est. average glucose Bld gHb Est-mCnc: 160 mg/dL
Hgb A1c MFr Bld: 7.2 % — ABNORMAL HIGH (ref 4.8–5.6)

## 2024-02-09 LAB — SEDIMENTATION RATE: Sed Rate: 13 mm/h (ref 0–30)

## 2024-02-09 LAB — VITAMIN B12: Vitamin B-12: 556 pg/mL (ref 232–1245)

## 2024-02-09 LAB — ANA W/REFLEX: Anti Nuclear Antibody (ANA): NEGATIVE

## 2024-02-09 NOTE — Addendum Note (Signed)
 Addended by: CHALICE SAUNAS on: 02/09/2024 05:38 PM   Modules accepted: Orders

## 2024-02-13 NOTE — Telephone Encounter (Signed)
 Pet scan [place] medicare auth: [id number] exp. 03/29/24 sent to [facility] [phone]

## 2024-02-15 ENCOUNTER — Encounter (HOSPITAL_COMMUNITY)
Admission: RE | Admit: 2024-02-15 | Discharge: 2024-02-15 | Disposition: A | Discharge status: Home / Self Care | Source: Ambulatory Visit | Attending: Neurology | Admitting: Neurology

## 2024-02-15 ENCOUNTER — Encounter: Payer: Self-pay | Admitting: Psychology

## 2024-02-15 ENCOUNTER — Ambulatory Visit (HOSPITAL_BASED_OUTPATIENT_CLINIC_OR_DEPARTMENT_OTHER)
Admission: RE | Admit: 2024-02-15 | Discharge: 2024-02-15 | Disposition: A | Discharge status: Home / Self Care | Source: Ambulatory Visit | Attending: Neurology | Admitting: Neurology

## 2024-02-15 DIAGNOSIS — Z720 Tobacco use: Secondary | ICD-10-CM | POA: Diagnosis not present

## 2024-02-15 DIAGNOSIS — R413 Other amnesia: Secondary | ICD-10-CM | POA: Insufficient documentation

## 2024-02-15 DIAGNOSIS — I200 Unstable angina: Secondary | ICD-10-CM

## 2024-02-15 DIAGNOSIS — K8309 Other cholangitis: Secondary | ICD-10-CM | POA: Diagnosis not present

## 2024-02-15 DIAGNOSIS — R2681 Unsteadiness on feet: Secondary | ICD-10-CM | POA: Insufficient documentation

## 2024-02-15 DIAGNOSIS — R9089 Other abnormal findings on diagnostic imaging of central nervous system: Secondary | ICD-10-CM | POA: Diagnosis not present

## 2024-02-15 DIAGNOSIS — R9082 White matter disease, unspecified: Secondary | ICD-10-CM | POA: Diagnosis not present

## 2024-02-15 MED ORDER — GADOBUTROL 1 MMOL/ML IV SOLN
8.0000 mL | Freq: Once | INTRAVENOUS | Status: AC | PRN
  Administered 2024-02-15: 8 mL via INTRAVENOUS

## 2024-02-15 MED ORDER — FLORBETAPIR F 18 500-1900 MBQ/ML IV SOLN
9.0250 | Freq: Once | INTRAVENOUS | Status: AC
  Administered 2024-02-15: 9.025 via INTRAVENOUS

## 2024-02-16 ENCOUNTER — Ambulatory Visit: Payer: Self-pay | Admitting: Neurology

## 2024-02-16 ENCOUNTER — Ambulatory Visit: Admitting: Neurology

## 2024-02-16 DIAGNOSIS — K8309 Other cholangitis: Secondary | ICD-10-CM

## 2024-02-16 DIAGNOSIS — R4182 Altered mental status, unspecified: Secondary | ICD-10-CM

## 2024-02-16 DIAGNOSIS — R413 Other amnesia: Secondary | ICD-10-CM

## 2024-02-16 DIAGNOSIS — R2681 Unsteadiness on feet: Secondary | ICD-10-CM

## 2024-02-16 DIAGNOSIS — Z720 Tobacco use: Secondary | ICD-10-CM

## 2024-02-16 DIAGNOSIS — I200 Unstable angina: Secondary | ICD-10-CM

## 2024-02-16 NOTE — Telephone Encounter (Signed)
 Called the pt to review lab results. There was no answer. Left a detailed message advising of the lab report message I sent on mychart. Advised pt he could refer to that or call back for me to review.

## 2024-02-16 NOTE — Telephone Encounter (Signed)
 Pt cld back to say they had read the [facility] message but needed clarification as they were not fully understanding the results message. Read through results w/Pt with explanation. Pt voiced understanding and thanks for the explanation. He stated he had PET scan yesterday and is in the office today for EEG. Thanked Pt for information and explained that it may be a few days before the provider gets the results to review but as soon as they are reviewed and we receive notes we will reach out to him. Pt voiced understanding.

## 2024-02-17 ENCOUNTER — Ambulatory Visit (HOSPITAL_BASED_OUTPATIENT_CLINIC_OR_DEPARTMENT_OTHER): Admitting: Orthopaedic Surgery

## 2024-02-17 ENCOUNTER — Ambulatory Visit (HOSPITAL_BASED_OUTPATIENT_CLINIC_OR_DEPARTMENT_OTHER): Payer: Self-pay | Admitting: Orthopaedic Surgery

## 2024-02-17 ENCOUNTER — Encounter: Payer: Self-pay | Admitting: Family Medicine

## 2024-02-17 ENCOUNTER — Telehealth (HOSPITAL_BASED_OUTPATIENT_CLINIC_OR_DEPARTMENT_OTHER): Payer: Self-pay | Admitting: Orthopaedic Surgery

## 2024-02-17 ENCOUNTER — Encounter (HOSPITAL_BASED_OUTPATIENT_CLINIC_OR_DEPARTMENT_OTHER): Payer: Self-pay

## 2024-02-17 ENCOUNTER — Other Ambulatory Visit (HOSPITAL_BASED_OUTPATIENT_CLINIC_OR_DEPARTMENT_OTHER): Payer: Self-pay

## 2024-02-17 DIAGNOSIS — M25511 Pain in right shoulder: Secondary | ICD-10-CM

## 2024-02-17 MED ORDER — ACETAMINOPHEN 500 MG PO TABS
500.0000 mg | ORAL_TABLET | Freq: Three times a day (TID) | ORAL | 0 refills | Status: AC
  Filled 2024-02-17: qty 30, 10d supply, fill #0

## 2024-02-17 MED ORDER — OXYCODONE HCL 5 MG PO TABS
5.0000 mg | ORAL_TABLET | ORAL | 0 refills | Status: DC | PRN
  Filled 2024-02-17: qty 10, 2d supply, fill #0

## 2024-02-17 MED ORDER — IBUPROFEN 800 MG PO TABS
800.0000 mg | ORAL_TABLET | Freq: Three times a day (TID) | ORAL | 0 refills | Status: AC
  Filled 2024-02-17: qty 30, 10d supply, fill #0

## 2024-02-17 MED ORDER — ASPIRIN 325 MG PO TBEC
325.0000 mg | DELAYED_RELEASE_TABLET | Freq: Every day | ORAL | 0 refills | Status: DC
  Filled 2024-02-17: qty 30, 30d supply, fill #0

## 2024-02-17 NOTE — Telephone Encounter (Signed)
 Order has been sent to [facility]

## 2024-02-17 NOTE — Progress Notes (Signed)
 Chief Complaint: Right shoulder pain     History of Present Illness:    Troy Boyer is a 73 y.o. male right dominant male presents with right shoulder pain as a referral from Dr. Vernetta and Tory Gaskins, PA.  He is status post right shoulder rotator cuff repair in 2008.  Since this time he was doing extremely well but has noticed a subsequent recurrence of shoulder pain.  He is experience pain deep in the joint as well as about the lateral deltoid.  He has had multiple injections following this without any subsequent relief.  He is having a difficult time with overhead activity.  He has pain even with laying or sitting    PMH/PSH/Family History/Social History/Meds/Allergies:    Past Medical History:  Diagnosis Date   Adenomatous colon polyp    tubular   CAD (coronary artery disease)    a. Cath 06/08/01 at [facility] with normal LM and LAD, 95% LCx s/p Circumflex stent 4.0 x 15 mm Penta and 85% and 80% prox RCA s/p 3.5 x 23 mm Penta b. cath 01/20/2015 95% prox LCx ISR treated with DES, 55% prox to mid RCA, 45% distal RCA, 40% midLAD, EF 55%     Cataract    Diabetes mellitus without complication (HCC)    Diarrhea    Diverticulosis    Erectile dysfunction    Gallstones    Hyperlipidemia    Hypertension    Myocardial infarction [facility])    Past Surgical History:  Procedure Laterality Date   ANGIOPLASTY  2002   2 stents   BALLOON DILATION N/A 08/27/2023   Procedure: BALLOON DILATION;  Surgeon: Abran Norleen SAILOR, MD;  Location: [facility];  Service: Gastroenterology;  Laterality: N/A;   CARDIAC CATHETERIZATION N/A 01/20/2015   Procedure: Left Heart Cath and Coronary Angiography;  Surgeon: Victory LELON Sharps, MD;  Location: [facility] INVASIVE CV LAB;  Service: Cardiovascular;  Laterality: N/A;   CARDIAC CATHETERIZATION N/A 01/27/2016   Procedure: Left Heart Cath and Coronary Angiography;  Surgeon: Ozell Fell, MD;  Location: [facility] INVASIVE CV LAB;  Service: Cardiovascular;  Laterality: N/A;    CHOLECYSTECTOMY N/A 02/09/2016   Procedure: LAPAROSCOPIC CHOLECYSTECTOMY WITH INTRAOPERATIVE CHOLANGIOGRAM;  Surgeon: Debby Shipper, MD;  Location: [facility] OR;  Service: General;  Laterality: N/A;   COLONOSCOPY W/ POLYPECTOMY  08/2013   Avg risk screening, Dr Arlester Aho. 5 mm tubular adenoma ascending.  Mild to moderated descending and sigmoid tics.  Internal hemorrhoids   CORONARY ANGIOPLASTY WITH STENT PLACEMENT     ERCP N/A 02/13/2016   Procedure: ENDOSCOPIC RETROGRADE CHOLANGIOPANCREATOGRAPHY (ERCP);  Surgeon: Victory LITTIE Legrand DOUGLAS, MD;  Location: [facility] ENDOSCOPY;  Service: Gastroenterology;  Laterality: N/A;   ERCP N/A 08/27/2023   Procedure: ENDOSCOPIC RETROGRADE CHOLANGIOPANCREATOGRAPHY (ERCP);  Surgeon: Abran Norleen SAILOR, MD;  Location: [facility];  Service: Gastroenterology;  Laterality: N/A;   REMOVAL OF STONES  08/27/2023   Procedure: REMOVAL OF STONES;  Surgeon: Abran Norleen SAILOR, MD;  Location: [facility];  Service: Gastroenterology;;   ROTATOR CUFF REPAIR Right 2009   ROTATOR CUFF REPAIR Left 2014   SPHINCTEROTOMY  08/27/2023   Procedure: SPHINCTEROTOMY;  Surgeon: Abran Norleen SAILOR, MD;  Location: [facility];  Service: Gastroenterology;;   TRIGGER FINGER RELEASE Bilateral 2013   4 fingers, middle finger on both hands   Social History   Socioeconomic History   Marital status: Significant Other    Spouse name: Not on file   Number of children: 2   Years of education:  18   Highest education level: Bachelor's degree (e.g., BA, AB, BS)  Occupational History   Occupation: Airline pilot- retired in 2018    Employer: [employer]  Tobacco Use   Smoking status: Former    Current packs/day: 0.00    Average packs/day: 1 pack/day for 45.0 years (45.0 ttl pk-yrs)    Types: Cigarettes    Start date: 01/16/1970    Quit date: 01/17/2015    Years since quitting: 9.0   Smokeless tobacco: Never  Vaping Use   Vaping status: Never Used  Substance and Sexual Activity   Alcohol use: Not Currently     Alcohol/week: 1.0 standard drink of alcohol    Types: 1 Glasses of wine per week    Comment: Previously 1 large vodka tonic per day Quit 06/2023   Drug use: No   Sexual activity: Yes  Other Topics Concern   Not on file  Social History Narrative   Fun: Boat, golf   Denies religious beliefs effecting health care.    Social Drivers of Corporate investment banker Strain: Low Risk  (08/20/2023)   Overall Financial Resource Strain (CARDIA)    Difficulty of Paying Living Expenses: Not hard at all  Food Insecurity: No Food Insecurity (09/01/2023)   Hunger Vital Sign    Worried About Running Out of Food in the Last Year: Never true    Ran Out of Food in the Last Year: Never true  Transportation Needs: No Transportation Needs (09/01/2023)   PRAPARE - Administrator, Civil Service (Medical): No    Lack of Transportation (Non-Medical): No  Physical Activity: Inactive (08/20/2023)   Exercise Vital Sign    Days of Exercise per Week: 0 days    Minutes of Exercise per Session: 90 min  Stress: Stress Concern Present (08/20/2023)   [employer] of Occupational Health - Occupational Stress Questionnaire    Feeling of Stress : To some extent  Social Connections: Socially Integrated (08/27/2023)   Social Connection and Isolation Panel    Frequency of Communication with Friends and Family: Twice a week    Frequency of Social Gatherings with Friends and Family: Once a week    Attends Religious Services: More than 4 times per year    Active Member of [employer] or Organizations: No    Attends Engineer, structural: More than 4 times per year    Marital Status: Living with partner   Family History  Problem Relation Age of Onset   Dementia Mother    Crohn's disease Mother    Heart disease Father 59       Valve replacement and CAD, CABG   Diabetes Father    CAD Sister 63   CAD Brother 12       Died age 33   Diabetes Brother    Kidney disease Brother    Colon cancer Neg Hx     Esophageal cancer Neg Hx    Stomach cancer Neg Hx    Rectal cancer Neg Hx    Pancreatic cancer Neg Hx    Allergies  Allergen Reactions   Other Rash    ECG electrodes, per patient   Protonix  [Pantoprazole ] Other (See Comments)    Exacerbated reflux s/s's   Current Outpatient Medications  Medication Sig Dispense Refill   acetaminophen  (TYLENOL ) 500 MG tablet Take 1 tablet (500 mg total) by mouth every 8 (eight) hours for 10 days. 30 tablet 0   aspirin  EC 325 MG tablet Take 1  tablet (325 mg total) by mouth daily. 30 tablet 0   ibuprofen  (ADVIL ) 800 MG tablet Take 1 tablet (800 mg total) by mouth every 8 (eight) hours for 10 days. Please take with food, please alternate with acetaminophen  30 tablet 0   oxyCODONE  (ROXICODONE ) 5 MG immediate release tablet Take 1 tablet (5 mg total) by mouth every 4 (four) hours as needed for severe pain (pain score 7-10) or breakthrough pain. 10 tablet 0   amLODipine  (NORVASC ) 5 MG tablet Take 1 tablet (5 mg total) by mouth daily. 90 tablet 1   atorvastatin  (LIPITOR ) 80 MG tablet Take 1 tablet (80 mg total) by mouth at bedtime. 90 tablet 1   celecoxib  (CELEBREX ) 200 MG capsule Take 1 capsule (200 mg total) by mouth 2 (two) times daily between meals as needed. 60 capsule 1   ezetimibe  (ZETIA ) 10 MG tablet Take 1 tablet (10 mg total) by mouth daily. 90 tablet 2   fenofibrate  (TRICOR ) 48 MG tablet TAKE 1 TABLET BY MOUTH DAILY 90 tablet 3   glucose blood (ACCU-CHEK GUIDE) test strip Use daily to check blood sugar.  DX E11.65 100 each 3   Krill Oil 500 MG CAPS Take 500 mg by mouth 2 (two) times daily.     memantine  (NAMENDA ) 5 MG tablet Take 1 tablet (5 mg total) by mouth 2 (two) times daily. 60 tablet 5   metFORMIN  (GLUCOPHAGE -XR) 500 MG 24 hr tablet TAKE 2 TABLETS BY MOUTH TWICE  DAILY WITH FOOD 360 tablet 3   metoprolol  tartrate (LOPRESSOR ) 25 MG tablet TAKE 1 TABLET BY MOUTH TWICE  DAILY 180 tablet 3   Multiple Vitamin (MULTIVITAMIN WITH MINERALS) TABS tablet  Take 1 tablet by mouth daily.     nitroGLYCERIN  (NITROSTAT ) 0.4 MG SL tablet Place 1 tablet (0.4 mg total) under the tongue every 5 (five) minutes as needed for chest pain. 25 tablet 3   pioglitazone  (ACTOS ) 45 MG tablet Take 1 tablet (45 mg total) by mouth daily. 90 tablet 3   ramipril  (ALTACE ) 10 MG capsule TAKE 1 CAPSULE BY MOUTH TWICE  DAILY 180 capsule 3   tadalafil  (CIALIS ) 5 MG tablet Take 1 tablet (5 mg total) by mouth daily. (Patient taking differently: Take 5 mg by mouth daily as needed for erectile dysfunction.) 30 tablet 11   Testosterone  (ANDROGEL ) 20.25 MG/1.25GM (1.62%) GEL Apply 4 Pump topically daily. 150 g 5   traZODone  (DESYREL ) 50 MG tablet Take 1 tablet (50 mg total) by mouth at bedtime as needed for sleep. 90 tablet 1   Vibegron  (GEMTESA ) 75 MG TABS Take 1 tablet (75 mg total) by mouth daily. 30 tablet 11   No current facility-administered medications for this visit.   NM PET Brain Amyloid Result Date: 02/16/2024 CLINICAL DATA:  72 year old male with cognitive impairment. Nonvascular nausea suspected EXAM: NUCLEAR MEDICINE PET BRAIN AMYLOID TECHNIQUE: 9.0 mCi F-18 Florbetapir was injected intravenously. Full-ring PET imaging was performed from the vertex to skull base. CT data was obtained and used for attenuation correction and anatomic localization. COMPARISON:  Brain MRI 02/15/2024 FINDINGS: There is an underlying white matter pattern with gray-white differentiation within the temporal lobes. However, there is loss of gray-white differentiation within the frontal lobes as well as the high parietal lobes and precuneus. Findings are suggestive significant amyloid deposition these cerebral cortices. Favor mildly positive scan for cerebral amyloid deposition IMPRESSION: Mildly POSITIVE SCAN for brain amyloid is most consistent with the presence of moderate to frequent neuritic beta-amyloid plaques in the  brain. A positive scan demonstrates the presence of AD pathology. Note:  Florbetapir is a radiopharmaceutical indicated for Positron Emission Tomography (PET) imaging of beta-amyloid neuritic plaques in the brains of adult patients with cognitive impairment being evaluated for suspected Alzheimer's disease (AD). A positive scan indicates moderate to frequent plaques, which demonstrates the presence of AD pathology. A negative scan indicates sparse or no plaques, which is inconsistent with a diagnosis of AD. Florbetapir is an adjunct to other diagnostic evaluations. Electronically Signed   By: Jackquline Boxer M.D.   On: 02/16/2024 08:43    Review of Systems:   A ROS was performed including pertinent positives and negatives as documented in the HPI.  Physical Exam :   Constitutional: NAD and appears stated age Neurological: Alert and oriented Psych: Appropriate affect and cooperative There were no vitals taken for this visit.   Comprehensive Musculoskeletal Exam:    Right shoulder with active forward elevation to 160 degrees bilaterally.  External rotation at side is from 50 degrees.  Internal rotation is to L1 bilaterally.  Sensation is intact throughout 4 out of 5 strength with forward elevation with positive Neer impingement   Imaging:   Xray (3 views right shoulder): Mild glenohumeral osteoarthritis  MRI (right shoulder): evidence of full-thickness cartilage loss particularly involving the anterior glenoid)   I personally reviewed and interpreted the radiographs.   Assessment and Plan:   73 y.o. male right dominant male with evidence of known glenohumeral osteoarthritis following previous rotator cuff repair which does appear intact.  At today's visit he has had multiple follow-up injections.  Given this we did discuss treatment options.  I do believe he would be a candidate for reverse shoulder replacement.  I did discuss her risks limitations as well as associated recovery timeframe.  After discussion he would like to proceed  -Plan for right shoulder  reverse shoulder arthroplasty   After a lengthy discussion of treatment options, including risks, benefits, alternatives, complications of surgical and nonsurgical conservative options, the patient elected surgical repair.   The patient  is aware of the material risks  and complications including, but not limited to injury to adjacent structures, neurovascular injury, infection, numbness, bleeding, implant failure, thermal burns, stiffness, persistent pain, failure to heal, disease transmission from allograft, need for further surgery, dislocation, anesthetic risks, blood clots, risks of death,and others. The probabilities of surgical success and failure discussed with patient given their particular co-morbidities.The time and nature of expected rehabilitation and recovery was discussed.The patient's questions were all answered preoperatively.  No barriers to understanding were noted. I explained the natural history of the disease process and Rx rationale.  I explained to the patient what I considered to be reasonable expectations given their personal situation.  The final treatment plan was arrived at through a shared patient decision making process model.    I personally saw and evaluated the patient, and participated in the management and treatment plan.  Elspeth Parker, MD Attending Physician, Orthopedic Surgery  This document was dictated using Dragon voice recognition software. A reasonable attempt at proof reading has been made to minimize errors.

## 2024-02-17 NOTE — Addendum Note (Signed)
 Addended by: WOLFGANG CONLEY HERO on: 02/17/2024 01:50 PM   Modules accepted: Orders

## 2024-02-17 NOTE — Addendum Note (Signed)
 Addended by: WOLFGANG CONLEY HERO on: 02/17/2024 12:02 PM   Modules accepted: Orders

## 2024-02-17 NOTE — Telephone Encounter (Signed)
 Patient wants to go to [facility] for his MRI

## 2024-02-19 ENCOUNTER — Encounter: Payer: Self-pay | Admitting: Cardiology

## 2024-02-19 ENCOUNTER — Encounter: Payer: Self-pay | Admitting: Neurology

## 2024-02-20 ENCOUNTER — Ambulatory Visit: Admitting: Physician Assistant

## 2024-02-20 NOTE — Procedures (Signed)
 [facility]  EEG (ELECTROENCEPHALOGRAM) REPORT Hardy Pride.  [id number]   ORDERING CLINICIAN: Dedra Gores, M.D.  TECHNOLOGIST: Burnard Plummer , REEGT TECHNIQUE:  This EEG study was done with scalp electrodes positioned according to the 10-20 International system of electrode placement. Electrical activity was reviewed with band pass filter of 1-70Hz , sensitivity of 7 uV/mm, display speed of 37mm/sec with a 60Hz  notched filter applied as appropriate. EEG data were recorded continuously and digitally stored.    Recoding duration : from 13.52 hours to 14;41 hours. Activation included:  Photic stimulation ; yes Hyperventilation ; yes .      Description: The EEG's posterior dominant background rhythm of 10.5 hertz was symmetrically displayed while the patient's eyes were closed  and promptly attenuated with eye opening. At baseline, the recording showed a low-medium  amplitude in symmetric fashion.  Photic stimulation was initiated at frequencies from 3- through 21 hertz, resulting in photic entrainment at  all frequencies, most prominent between 5 and 15 herz  ,  without periodic or rhythmic discharges, or epileptiform activity.  Hyperventilation maneuver was initiated, not leading to amplitude build-up,  not leading to slowing.  Following hyperventilation the patient's EEG was reviewed for a period of 1 and 2 minutes post maneuver, now slowed and associated with relaxation/ drowsiness- there were some vertex sharp waves in higher amplitude than before indicating drowsiness.    The patient was recorded while awake, while drowsy.   ECG: heart rate in NSR at 68-74  BPM.   IMPRESSION:  This EEG is a normal study.   A normal EEG study however does not rule out epilepsy.   SABRA Dedra Gores, M.D. Accredited by the ABPN, ABSM.

## 2024-02-21 ENCOUNTER — Ambulatory Visit (HOSPITAL_BASED_OUTPATIENT_CLINIC_OR_DEPARTMENT_OTHER)
Admission: RE | Admit: 2024-02-21 | Discharge: 2024-02-21 | Disposition: A | Discharge status: Home / Self Care | Source: Ambulatory Visit | Attending: Orthopaedic Surgery | Admitting: Orthopaedic Surgery

## 2024-02-21 DIAGNOSIS — M25511 Pain in right shoulder: Secondary | ICD-10-CM | POA: Insufficient documentation

## 2024-02-21 DIAGNOSIS — M19011 Primary osteoarthritis, right shoulder: Secondary | ICD-10-CM | POA: Diagnosis not present

## 2024-02-23 ENCOUNTER — Telehealth: Payer: Self-pay

## 2024-02-24 ENCOUNTER — Telehealth: Payer: Self-pay | Admitting: Orthopaedic Surgery

## 2024-02-24 ENCOUNTER — Ambulatory Visit: Payer: Self-pay | Admitting: Family Medicine

## 2024-02-24 ENCOUNTER — Ambulatory Visit: Admitting: Family Medicine

## 2024-02-24 ENCOUNTER — Encounter: Payer: Self-pay | Admitting: Family Medicine

## 2024-02-24 ENCOUNTER — Encounter (HOSPITAL_BASED_OUTPATIENT_CLINIC_OR_DEPARTMENT_OTHER): Payer: Self-pay | Admitting: Orthopaedic Surgery

## 2024-02-24 VITALS — BP 134/78 | HR 56 | Temp 97.8°F | Resp 18 | Ht 64.0 in | Wt 190.0 lb

## 2024-02-24 DIAGNOSIS — E782 Mixed hyperlipidemia: Secondary | ICD-10-CM

## 2024-02-24 DIAGNOSIS — Z7984 Long term (current) use of oral hypoglycemic drugs: Secondary | ICD-10-CM | POA: Diagnosis not present

## 2024-02-24 DIAGNOSIS — Z01818 Encounter for other preprocedural examination: Secondary | ICD-10-CM

## 2024-02-24 DIAGNOSIS — E1165 Type 2 diabetes mellitus with hyperglycemia: Secondary | ICD-10-CM

## 2024-02-24 LAB — LIPID PANEL
Cholesterol: 118 mg/dL (ref 0–200)
HDL: 51.2 mg/dL (ref >39.00)
LDL Cholesterol: 49 mg/dL (ref 0–99)
NonHDL: 67.13
Total CHOL/HDL Ratio: 2
Triglycerides: 91 mg/dL (ref 0.0–149.0)
VLDL: 18.2 mg/dL (ref 0.0–40.0)

## 2024-02-24 NOTE — Progress Notes (Signed)
 Subjective:   Chief Complaint  Patient presents with   Follow-up    4 month     Troy Boyer is a 74 y.o. male here for follow-up of diabetes.  Here w wife who helps w hx.  Troy Boyer's self monitored glucose range is low 100's.  Patient denies hypoglycemic reactions. He checks his glucose levels several times per week. Patient does not require insulin .   Medications include: metformin  XR 1000 mg bid, Actos  45 mg/d,  Diet is OK.  Exercise: none  Hyperlipidemia Patient presents for hyperlipidemia follow up. Currently being treated with Lipitor  80 mg/d, Tricor  48 mg and compliance with treatment thus far has been good. He denies myalgias. Diet/exercise as above.  The patient is known to have coexisting coronary artery disease.  Patient needs shoulder replacement of the right shoulder.  Surgery is pending cardiac and medical clearance.  He has never had any issues with anesthesia before.  No family history of adverse effects to anesthesia.  No chipped, cracked, loose, or missing teeth.  Good range of motion of the neck.  He is able to walk up 2 flights of stairs without becoming significantly short of breath.  Denies chest pain.  Most recent A1c was 7.2.  Past Medical History:  Diagnosis Date   Adenomatous colon polyp    tubular   CAD (coronary artery disease)    a. Cath 06/08/01 at [facility] with normal LM and LAD, 95% LCx s/p Circumflex stent 4.0 x 15 mm Penta and 85% and 80% prox RCA s/p 3.5 x 23 mm Penta b. cath 01/20/2015 95% prox LCx ISR treated with DES, 55% prox to mid RCA, 45% distal RCA, 40% midLAD, EF 55%     Cataract    Diabetes mellitus without complication (HCC)    Diarrhea    Diverticulosis    Erectile dysfunction    Gallstones    Hyperlipidemia    Hypertension    Myocardial infarction (HCC)      Related testing: Retinal exam: Done Pneumovax: done  Objective:  BP 134/78   Pulse (!) 56   Temp 97.8 F (36.6 C)   Resp 18   Ht 5' 4 (1.626 m)   Wt 190 lb  (86.2 kg)   SpO2 96%   BMI 32.61 kg/m  General:  Well developed, well nourished, in no apparent distress Skin:  Warm, no pallor or diaphoresis on exposed skin Lungs:  CTAB, no access msc use Neuro: Gait is cautious Mouth: MMM, no obvious dental abnormalities Eyes: PERRLA, EOMI Cardio: Regular rhythm, bradycardic, no bruits, no LE edema Psych: Age appropriate judgment and insight  Assessment:   Type 2 diabetes mellitus with hyperglycemia, without long-term current use of insulin  (HCC) - Plan: Lipid panel  Mixed hyperlipidemia  Preop examination   Plan:   Chronic, stable.  Continue metformin  XR 1000 mg twice daily, Actos  45 mg daily.  After surgery, he will reach out and we will start Mounjaro 2.5 mg weekly and titrate up.  Hopefully we can get him off of the Actos .  Counseled on diet and exercise. Chronic, hopefully stable.  Continue Tricor  48 mg daily, Lipitor  80 mg daily. Low risk for major adverse event during surgery.  No medication adjustments prior to surgery other than holding NSAIDs for 5 days.  He is medically optimized for the proposed procedure. F/u in 6 mo. The patient and his spouse voiced understanding and agreement to the plan.  Mabel [place], DO 02/24/24 11:19 AM

## 2024-02-24 NOTE — Telephone Encounter (Signed)
 Patient called and said that his other doctors needs a release form sent to the cardiologist and his primary care. RA#[phone] He needs this so he can schedule for surgery.

## 2024-02-24 NOTE — Patient Instructions (Signed)
 Give us  2-3 business days to get the results of your labs back.   Keep the diet clean and stay active.  Send me a message 1-2 weeks after surgery and we can send in the Mounjaro. Let me know if there are cost issues.  Let us  know if you need anything.

## 2024-02-29 ENCOUNTER — Telehealth (HOSPITAL_BASED_OUTPATIENT_CLINIC_OR_DEPARTMENT_OTHER): Payer: Self-pay | Admitting: Orthopaedic Surgery

## 2024-02-29 NOTE — Telephone Encounter (Signed)
 Patient states that he went to primary care and Heart doctor to get releases for his surgery and the his doctors said they did not get anything from Dr B for release for surgery

## 2024-03-01 NOTE — Telephone Encounter (Signed)
-----   Message from Perla Simpers sent at 03/01/2024  2:32 PM EDT -----  Pt call , returning call  , PT has decline  all other appt that were offered   due to transportation . Pt wife have to work and she is taking him back and forward to appt    , Pt  will like call back  with updates appt .

## 2024-03-02 ENCOUNTER — Encounter (HOSPITAL_BASED_OUTPATIENT_CLINIC_OR_DEPARTMENT_OTHER): Payer: Self-pay | Admitting: Orthopaedic Surgery

## 2024-03-05 ENCOUNTER — Telehealth: Payer: Self-pay

## 2024-03-05 ENCOUNTER — Encounter: Payer: Self-pay | Admitting: Orthopaedic Surgery

## 2024-03-05 ENCOUNTER — Encounter: Payer: Self-pay | Admitting: Cardiology

## 2024-03-05 NOTE — Telephone Encounter (Signed)
 Copied from CRM [id number]. Topic: General - Other >> Mar 05, 2024  3:22 PM Macario HERO wrote: Reason for CRM: Patient called regarding paperwork for my Surgery clearance that should have been sent to Dr. Genelle.  Requesting a follow up because he's been in some pain. Advised should receive a call back tomorrow since it is after 3pm.  Please Advise Called pt was advised we haven't received anything  for him about surgery, Pt stated Dr.Bokshan office said they just need letter saying its ok to have shoulder    surgery.

## 2024-03-06 ENCOUNTER — Telehealth: Payer: Self-pay

## 2024-03-06 NOTE — Telephone Encounter (Signed)
   Pre-operative Risk Assessment    Patient Name: Troy Boyer  DOB: [birth year] MRN: [id number]   Date of last office visit: 11/18/23 Date of next office visit: N/A   Request for Surgical Clearance    Procedure:  Right reverse shoulder arthroplasty   Date of Surgery:  Clearance TBD                                 Surgeon:   Surgeon's Group or Practice Name:  [facility] Phone number:  [phone] Fax number:  [phone]   Type of Clearance Requested:   - Medical  - Pharmacy:  Hold Aspirin  Not indicated    Type of Anesthesia:  General    Additional requests/questions:    SignedRebeca Blight   03/06/2024, 4:19 PM

## 2024-03-06 NOTE — Telephone Encounter (Signed)
 Based off my most recent visit with him on 02/24/24, he is medically optimized for shoulder surgery. A1c 7.2. BMI 32.6. GFR and Hb stable. Defer to Dr. Vertie team for cardiac clearance. Thx.

## 2024-03-06 NOTE — Telephone Encounter (Signed)
   Name: Troy Boyer  DOB: 11-17-1950  MRN: [id number]  Primary Cardiologist: Redell Shallow, MD   Preoperative team, please contact this patient and set up a phone call appointment for further preoperative risk assessment. Please obtain consent and complete medication review. Thank you for your help.  I confirm that guidance regarding antiplatelet and oral anticoagulation therapy has been completed and, if necessary, noted below.  Regarding ASA therapy, we recommend continuation of ASA throughout the perioperative period. However, if the surgeon feels that cessation of ASA is required in the perioperative period, it may be stopped 5-7 days prior to surgery with a plan to resume it as soon as felt to be feasible from a surgical standpoint in the post-operative period.    I also confirmed the patient resides in the state of [place] . As per [facility] Medical Board telemedicine laws, the patient must reside in the state in which the provider is licensed.   Josefa CHRISTELLA Beauvais, NP 03/06/2024, 4:25 PM [facility]

## 2024-03-06 NOTE — Telephone Encounter (Signed)
 I My Chart messaged patient letting him know his surgery information was not received until 9/5. A clearance request was faxed over to his Cardiologist today, and once clearance is received he will be called to schedule surgery.

## 2024-03-06 NOTE — Telephone Encounter (Signed)
  Patient Consent for Virtual Visit         Troy Boyer has provided verbal consent on 03/06/2024 for a virtual visit (video or telephone).  Appointment schdeduled for 03/08/2024 @ 10:40am. Call patient at [phone]. Med req and consent are complete.    CONSENT FOR VIRTUAL VISIT FOR:  Troy Boyer  By participating in this virtual visit I agree to the following:  I hereby voluntarily request, consent and authorize [facility] and its employed or contracted physicians, physician assistants, nurse practitioners or other licensed health care professionals (the Practitioner), to provide me with telemedicine health care services (the "Services) as deemed necessary by the treating Practitioner. I acknowledge and consent to receive the Services by the Practitioner via telemedicine. I understand that the telemedicine visit will involve communicating with the Practitioner through live audiovisual communication technology and the disclosure of certain medical information by electronic transmission. I acknowledge that I have been given the opportunity to request an in-person assessment or other available alternative prior to the telemedicine visit and am voluntarily participating in the telemedicine visit.  I understand that I have the right to withhold or withdraw my consent to the use of telemedicine in the course of my care at any time, without affecting my right to future care or treatment, and that the Practitioner or I may terminate the telemedicine visit at any time. I understand that I have the right to inspect all information obtained and/or recorded in the course of the telemedicine visit and may receive copies of available information for a reasonable fee.  I understand that some of the potential risks of receiving the Services via telemedicine include:  Delay or interruption in medical evaluation due to technological equipment failure or disruption; Information transmitted may not  be sufficient (e.g. poor resolution of images) to allow for appropriate medical decision making by the Practitioner; and/or  In rare instances, security protocols could fail, causing a breach of personal health information.  Furthermore, I acknowledge that it is my responsibility to provide information about my medical history, conditions and care that is complete and accurate to the best of my ability. I acknowledge that Practitioner's advice, recommendations, and/or decision may be based on factors not within their control, such as incomplete or inaccurate data provided by me or distortions of diagnostic images or specimens that may result from electronic transmissions. I understand that the practice of medicine is not an exact science and that Practitioner makes no warranties or guarantees regarding treatment outcomes. I acknowledge that a copy of this consent can be made available to me via my patient portal [facility] MyChart), or I can request a printed copy by calling the office of [facility].    I understand that my insurance will be billed for this visit.   I have read or had this consent read to me. I understand the contents of this consent, which adequately explains the benefits and risks of the Services being provided via telemedicine.  I have been provided ample opportunity to ask questions regarding this consent and the Services and have had my questions answered to my satisfaction. I give my informed consent for the services to be provided through the use of telemedicine in my medical care

## 2024-03-06 NOTE — Telephone Encounter (Signed)
 Appointment schdeduled for 03/08/2024 @ 10:40am. Call patient at [phone]. Med req and consent are complete.

## 2024-03-06 NOTE — Telephone Encounter (Signed)
Called pt and letter has been faxed  ?

## 2024-03-06 NOTE — Telephone Encounter (Signed)
 Called pt was advised letter for his shoulder surgery  Would be faxed to [facility] office.

## 2024-03-07 ENCOUNTER — Encounter: Payer: Self-pay | Admitting: Neurology

## 2024-03-07 ENCOUNTER — Telehealth: Admitting: Neurology

## 2024-03-07 DIAGNOSIS — G309 Alzheimer's disease, unspecified: Secondary | ICD-10-CM | POA: Insufficient documentation

## 2024-03-07 DIAGNOSIS — G3184 Mild cognitive impairment, so stated: Secondary | ICD-10-CM | POA: Diagnosis not present

## 2024-03-07 DIAGNOSIS — R454 Irritability and anger: Secondary | ICD-10-CM | POA: Diagnosis not present

## 2024-03-07 MED ORDER — DONEPEZIL HCL 5 MG PO TABS: ORAL_TABLET | ORAL | 0 refills | Status: DC
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
## 2024-03-08 ENCOUNTER — Ambulatory Visit: Attending: Cardiology

## 2024-03-08 ENCOUNTER — Telehealth: Payer: Self-pay | Admitting: Cardiology

## 2024-03-08 DIAGNOSIS — Z01818 Encounter for other preprocedural examination: Secondary | ICD-10-CM

## 2024-03-08 NOTE — Telephone Encounter (Signed)
 Cardiology clearance faxed to Dr. Pietro 03/06/24.

## 2024-03-08 NOTE — Telephone Encounter (Signed)
 Spoke  to patient . He states he has not received his call for telephone visit at 10:40.  Per patient he did answer one call but the person hung up.   RN contacted pre-op ---  informed patient that they stated they did call  him with no answer. Pre-op will contact @4  pm today ,again.  phone # was  give to patient  so he would recognize the incoming call  RN ask patient to also check his spam calls.   Patient verbalized understhading

## 2024-03-08 NOTE — Progress Notes (Addendum)
 Virtual Visit via Telephone Note   Because of Troy Boyer co-morbid illnesses, he is at least at moderate risk for complications without adequate follow up.  This format is felt to be most appropriate for this patient at this time.  Due to technical limitations with video connection (technology), today's appointment will be conducted as an audio only telehealth visit, and Troy Boyer verbally agreed to proceed in this manner.   All issues noted in this document were discussed and addressed.  No physical exam could be performed with this format.  Evaluation Performed:  Preoperative cardiovascular risk assessment _____________   Date:  03/08/2024   Patient ID:  Troy Boyer, DOB Nov 18, 1950, MRN [id number] Patient Location:  Home Provider location:   Office  Primary Care Provider:  Frann Mabel Mt, DO Primary Cardiologist:  Redell Shallow, MD  Chief Complaint / Patient Profile   73 y.o. y/o male with a h/o NSTEMI infarction in 2002, hypertension, hyperlipidemia, mitral stenosis, liver mass who is pending right reverse shoulder arthroplasty and presents today for telephonic preoperative cardiovascular risk assessment.  History of Present Illness    Troy Boyer is a 73 y.o. male who presents via audio/video conferencing for a telehealth visit today.  Pt was last seen in cardiology clinic on 11/18/2023 by Dr. Shallow.  At that time Troy Boyer was doing well.  The patient is now pending procedure as outlined above. Since his last visit, he has been wonderful. No chest pains no chest pains. No swelling of feet or hands.   Regarding ASA therapy, we recommend continuation of ASA throughout the perioperative period. However, if the surgeon feels that cessation of ASA is required in the perioperative period, it may be stopped 5-7 days prior to surgery with a plan to resume it as soon as felt to be feasible from a surgical standpoint in the post-operative period. 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  He does not take the ASA per Dr. Shallow.  Advised him to hold multivitamins and pain medication a week before.  All other medication advice should come from surgeons office and anesthesiologist.   Past Medical History    Past Medical History:  Diagnosis Date   Adenomatous colon polyp    tubular   CAD (coronary artery disease)    a. Cath 06/08/01 at [facility] with normal LM and LAD, 95% LCx s/p Circumflex stent 4.0 x 15 mm Penta and 85% and 80% prox RCA s/p 3.5 x 23 mm Penta b. cath 01/20/2015 95% prox LCx ISR treated with DES, 55% prox to mid RCA, 45% distal RCA, 40% midLAD, EF 55%     Cataract    Diabetes mellitus without complication (HCC)    Diarrhea    Diverticulosis    Erectile dysfunction    Gallstones    Hyperlipidemia    Hypertension    Myocardial infarction [facility])    Past Surgical History:  Procedure Laterality Date   ANGIOPLASTY  2002   2 stents   BALLOON DILATION N/A 08/27/2023   Procedure: BALLOON DILATION;  Surgeon: Abran Norleen SAILOR, MD;  Location: [facility];  Service: Gastroenterology;  Laterality: N/A;   CARDIAC CATHETERIZATION N/A 01/20/2015   Procedure: Left Heart Cath and Coronary Angiography;  Surgeon: Victory LELON Sharps, MD;  Location: [facility] INVASIVE CV LAB;  Service: Cardiovascular;  Laterality: N/A;   CARDIAC CATHETERIZATION N/A 01/27/2016   Procedure: Left Heart Cath and Coronary Angiography;  Surgeon: Ozell Fell, MD;  Location: [facility]  INVASIVE CV LAB;  Service: Cardiovascular;  Laterality: N/A;   CHOLECYSTECTOMY N/A 02/09/2016   Procedure: LAPAROSCOPIC CHOLECYSTECTOMY WITH INTRAOPERATIVE CHOLANGIOGRAM;  Surgeon: Debby Shipper, MD;  Location: [facility] OR;  Service: General;  Laterality: N/A;   COLONOSCOPY W/ POLYPECTOMY  08/2013   Avg risk screening, Dr Arlester Aho. 5 mm tubular adenoma ascending.  Mild to moderated descending and sigmoid tics.  Internal hemorrhoids   CORONARY ANGIOPLASTY WITH STENT PLACEMENT     ERCP N/A 02/13/2016    Procedure: ENDOSCOPIC RETROGRADE CHOLANGIOPANCREATOGRAPHY (ERCP);  Surgeon: Victory LITTIE Legrand DOUGLAS, MD;  Location: [facility] ENDOSCOPY;  Service: Gastroenterology;  Laterality: N/A;   ERCP N/A 08/27/2023   Procedure: ENDOSCOPIC RETROGRADE CHOLANGIOPANCREATOGRAPHY (ERCP);  Surgeon: Abran Norleen SAILOR, MD;  Location: [facility];  Service: Gastroenterology;  Laterality: N/A;   REMOVAL OF STONES  08/27/2023   Procedure: REMOVAL OF STONES;  Surgeon: Abran Norleen SAILOR, MD;  Location: [facility];  Service: Gastroenterology;;   ROTATOR CUFF REPAIR Right 2009   ROTATOR CUFF REPAIR Left 2014   SPHINCTEROTOMY  08/27/2023   Procedure: SPHINCTEROTOMY;  Surgeon: Abran Norleen SAILOR, MD;  Location: [facility];  Service: Gastroenterology;;   AVA FINGER RELEASE Bilateral 2013   4 fingers, middle finger on both hands    Allergies  Allergies  Allergen Reactions   Other Rash    ECG electrodes, per patient   Protonix  [Pantoprazole ] Other (See Comments)    Exacerbated reflux s/s's    Home Medications    Prior to Admission medications   Medication Sig Start Date End Date Taking? Authorizing Provider  amLODipine  (NORVASC ) 5 MG tablet Take 1 tablet (5 mg total) by mouth daily. 11/10/23   Frann Mabel Mt, DO  aspirin  EC 325 MG tablet Take 1 tablet (325 mg total) by mouth daily. 02/17/24   Genelle Standing, MD  atorvastatin  (LIPITOR ) 80 MG tablet Take 1 tablet (80 mg total) by mouth at bedtime. 11/10/23   Frann Mabel Mt, DO  donepezil  (ARICEPT ) 5 MG tablet Take in AM po. 03/07/24   Dohmeier, Dedra, MD  ezetimibe  (ZETIA ) 10 MG tablet Take 1 tablet (10 mg total) by mouth daily. 02/02/24   Pietro Redell RAMAN, MD  fenofibrate  (TRICOR ) 48 MG tablet TAKE 1 TABLET BY MOUTH DAILY 12/06/23   Frann Mabel Mt, DO  glucose blood (ACCU-CHEK GUIDE) test strip Use daily to check blood sugar.  DX E11.65 04/25/23   Frann Mabel Mt, DO  Krill Oil 500 MG CAPS Take 500 mg by mouth 2 (two) times daily.    [provider]  memantine  (NAMENDA ) 5 MG tablet Take 1 tablet (5 mg total) by mouth 2 (two) times daily. 02/06/24   Dohmeier, Dedra, MD  metFORMIN  (GLUCOPHAGE -XR) 500 MG 24 hr tablet TAKE 2 TABLETS BY MOUTH TWICE  DAILY WITH FOOD 01/30/24   Frann Mabel Mt, DO  metoprolol  tartrate (LOPRESSOR ) 25 MG tablet TAKE 1 TABLET BY MOUTH TWICE  DAILY 12/07/23   Pietro Redell RAMAN, MD  Multiple Vitamin (MULTIVITAMIN WITH MINERALS) TABS tablet Take 1 tablet by mouth daily.    [provider]  nitroGLYCERIN  (NITROSTAT ) 0.4 MG SL tablet Place 1 tablet (0.4 mg total) under the tongue every 5 (five) minutes as needed for chest pain. 11/18/23   Pietro Redell RAMAN, MD  oxyCODONE  (ROXICODONE ) 5 MG immediate release tablet Take 1 tablet (5 mg total) by mouth every 4 (four) hours as needed for severe pain (pain score 7-10) or breakthrough pain. 02/17/24   Genelle Standing, MD  pioglitazone  (ACTOS )  45 MG tablet Take 1 tablet (45 mg total) by mouth daily. 05/11/23   Frann Mabel Mt, DO  ramipril  (ALTACE ) 10 MG capsule TAKE 1 CAPSULE BY MOUTH TWICE  DAILY 11/23/23   Pietro Redell RAMAN, MD  tadalafil  (CIALIS ) 5 MG tablet Take 1 tablet (5 mg total) by mouth daily. Patient taking differently: Take 5 mg by mouth daily as needed for erectile dysfunction. 10/19/21   Frann Mabel Mt, DO  Testosterone  (ANDROGEL ) 20.25 MG/1.25GM (1.62%) GEL Apply 4 Pump topically daily. 10/05/23   Stoneking, Adine PARAS., MD  traZODone  (DESYREL ) 50 MG tablet Take 1 tablet (50 mg total) by mouth at bedtime as needed for sleep. 12/22/23   Frann Mabel Mt, DO  Vibegron  (GEMTESA ) 75 MG TABS Take 1 tablet (75 mg total) by mouth daily. 11/18/23   Stoneking, Adine PARAS., MD    Physical Exam    Vital Signs:  Dorise Grade does not have vital signs available for review today.  Given telephonic nature of communication, physical exam is limited. AAOx3. NAD. Normal affect.  Speech and respirations are unlabored.  Accessory  Clinical Findings    None  Assessment & Plan    1.  Preoperative Cardiovascular Risk Assessment:   Mr. Harjo perioperative risk of a major cardiac event is 0.9% according to the Revised Cardiac Risk Index (RCRI).  Therefore, he is at low risk for perioperative complications.   His functional capacity is good at 5.07 METs according to the Duke Activity Status Index (DASI). Recommendations: According to ACC/AHA guidelines, no further cardiovascular testing needed.  The patient may proceed to surgery at acceptable risk.     The patient was advised that if he develops new symptoms prior to surgery to contact our office to arrange for a follow-up visit, and he verbalized understanding.   A copy of this note will be routed to requesting surgeon.  Time:   Today, I have spent 10 minutes with the patient with telehealth technology discussing medical history, symptoms, and management plan.     Orren LOISE Fabry, PA-C  03/08/2024, 4:12 PM

## 2024-03-08 NOTE — Telephone Encounter (Signed)
 Pt returning call. He states he could not hear anyone on the line and then he was hung up on. Requesting call back to reschedule. Please advise.

## 2024-03-09 ENCOUNTER — Encounter: Payer: Self-pay | Admitting: Physician Assistant

## 2024-03-09 MED ORDER — ASPIRIN 81 MG PO TBEC: 81.0000 mg | DELAYED_RELEASE_TABLET | Freq: Every day | ORAL | Status: AC

## 2024-03-09 NOTE — Telephone Encounter (Signed)
 I have not received it as of yet. I will call the patient to schedule surgery just as soon as I do.

## 2024-03-12 ENCOUNTER — Telehealth: Payer: Self-pay | Admitting: Orthopaedic Surgery

## 2024-03-12 NOTE — Telephone Encounter (Signed)
 I spoke with the patient and his life partner today and rescheduled his shoulder surgery for 04/19/24. Recardo wanted to know if patient will be okay to fly mid November by himself to [place]  with his shoulder surgery scheduled for 10/23? Please advise.

## 2024-03-12 NOTE — Telephone Encounter (Signed)
 Spoke with pt and confirmed TELE preop appt was completed. That he surgeons office has received the preop clearance per the patient.

## 2024-03-13 ENCOUNTER — Other Ambulatory Visit: Payer: Self-pay | Admitting: Orthopaedic Surgery

## 2024-03-13 ENCOUNTER — Encounter: Payer: Self-pay | Admitting: Family Medicine

## 2024-03-13 MED ORDER — CELECOXIB 200 MG PO CAPS
200.0000 mg | ORAL_CAPSULE | Freq: Two times a day (BID) | ORAL | 3 refills | Status: DC
Start: 2024-03-13 — End: 2024-05-08

## 2024-03-14 ENCOUNTER — Encounter: Payer: Self-pay | Admitting: Neurology

## 2024-03-14 NOTE — Telephone Encounter (Signed)
 Called pt was advised and he stated understand, to let us  know if couldn't get in  with [facility] sooner. Pt advised to schedule appt with Dr.Wendling if he not able to get in with [facility].

## 2024-03-18 ENCOUNTER — Encounter (HOSPITAL_BASED_OUTPATIENT_CLINIC_OR_DEPARTMENT_OTHER): Payer: Self-pay | Admitting: Orthopaedic Surgery

## 2024-03-29 NOTE — Telephone Encounter (Signed)
 Patient was rescheduled for 05/17/24.

## 2024-04-03 DIAGNOSIS — H43811 Vitreous degeneration, right eye: Secondary | ICD-10-CM | POA: Diagnosis not present

## 2024-04-03 DIAGNOSIS — E119 Type 2 diabetes mellitus without complications: Secondary | ICD-10-CM | POA: Diagnosis not present

## 2024-04-03 DIAGNOSIS — H532 Diplopia: Secondary | ICD-10-CM | POA: Diagnosis not present

## 2024-04-04 ENCOUNTER — Other Ambulatory Visit: Payer: Self-pay | Admitting: Family Medicine

## 2024-04-04 ENCOUNTER — Encounter: Payer: Self-pay | Admitting: Neurology

## 2024-04-04 ENCOUNTER — Encounter (HOSPITAL_BASED_OUTPATIENT_CLINIC_OR_DEPARTMENT_OTHER): Payer: Self-pay | Admitting: Orthopaedic Surgery

## 2024-04-05 ENCOUNTER — Other Ambulatory Visit: Payer: Self-pay | Admitting: Neurology

## 2024-04-05 ENCOUNTER — Encounter: Payer: Self-pay | Admitting: Neurology

## 2024-04-05 NOTE — Telephone Encounter (Signed)
 From OV 03/07/24: Recent enuresis times 3, this is new. Has had soft stolls and accidents several times in the last year, leading me to recommend namenda  over Aricept  but I like for him to try aricpet for 30 days and if it isnt making his IBS worse, I would want him to keep taking it. Started on 5 mg daily, can be AM or HS intake time .  Enuresis can be related to alcohol and trazodone .   Please advise on refill request. Thank you!

## 2024-04-09 MED ORDER — MEMANTINE HCL 10 MG PO TABS: 10.0000 mg | ORAL_TABLET | Freq: Two times a day (BID) | ORAL | Status: DC

## 2024-04-09 NOTE — Addendum Note (Signed)
 Addended by: HILLIARD HEATHER CROME on: 04/09/2024 01:49 PM   Modules accepted: Orders

## 2024-04-09 NOTE — Telephone Encounter (Signed)
 Placed Memantine  10 mg BID on med list per Dr Chalice. Patient will reach out to us  when new Rx is needed.

## 2024-04-18 ENCOUNTER — Ambulatory Visit (INDEPENDENT_AMBULATORY_CARE_PROVIDER_SITE_OTHER): Admitting: *Deleted

## 2024-04-18 ENCOUNTER — Telehealth: Payer: Self-pay | Admitting: *Deleted

## 2024-04-18 VITALS — Ht 64.0 in | Wt 187.0 lb

## 2024-04-18 DIAGNOSIS — Z87891 Personal history of nicotine dependence: Secondary | ICD-10-CM | POA: Diagnosis not present

## 2024-04-18 DIAGNOSIS — Z0000 Encounter for general adult medical examination without abnormal findings: Secondary | ICD-10-CM

## 2024-04-18 DIAGNOSIS — Z122 Encounter for screening for malignant neoplasm of respiratory organs: Secondary | ICD-10-CM

## 2024-04-18 NOTE — Patient Instructions (Signed)
 Troy Boyer , Thank you for taking time out of your busy schedule to complete your Annual Wellness Visit with me. I enjoyed our conversation and look forward to speaking with you again next year. I, as well as your care team,  appreciate your ongoing commitment to your health goals. Please review the following plan we discussed and let me know if I can assist you in the future. Your Game plan/ To Do List   Referrals: If you haven't heard from the office you've been referred to, please reach out to them at the phone provided.   Pulmonology (Lung cancer screening):  [phone]  Follow up Visits: Next Medicare AWV with our clinical staff: 04/22/25 11am, telephone.    Next Office Visit with your provider: 08/24/24 11am, Dr Frann.  Clinician Recommendations:  Aim for 30 minutes of exercise or brisk walking, 6-8 glasses of water, and 5 servings of fruits and vegetables each day.    You will need to get the following vaccines at your local pharmacy: Flu and Covid.     This is a list of the screening recommended for you and due dates:  Health Maintenance  Topic Date Due   Medicare Annual Wellness Visit  03/18/2023   Flu Shot  01/27/2024   COVID-19 Vaccine (7 - 2025-26 season) 02/27/2024   Screening for Lung Cancer  07/21/2024   Hemoglobin A1C  08/08/2024   Eye exam for diabetics  09/13/2024   Yearly kidney health urinalysis for diabetes  10/24/2024   Complete foot exam   10/24/2024   Yearly kidney function blood test for diabetes  02/05/2025   DTaP/Tdap/Td vaccine (2 - Td or Tdap) 04/23/2025   Colon Cancer Screening  02/17/2028   Pneumococcal Vaccine for age over 40  Completed   Hepatitis C Screening  Completed   Zoster (Shingles) Vaccine  Completed   Meningitis B Vaccine  Aged Out    Advanced directives: (Copy Requested) Please bring a copy of your health care power of attorney and living will to the office to be added to your chart at your convenience. You can mail to [facility] [address] [place], [place] [address] or email to [name] Advance Care Planning is important because it:  [x]  Makes sure you receive the medical care that is consistent with your values, goals, and preferences  [x]  It provides guidance to your family and loved ones and reduces their decisional burden about whether or not they are making the right decisions based on your wishes.  Follow the link provided in your after visit summary or read over the paperwork we have mailed to you to help you started getting your Advance Directives in place. If you need assistance in completing these, please reach out to us  so that we can help you!  See attachments for Preventive Care and Fall Prevention Tips.

## 2024-04-18 NOTE — Progress Notes (Signed)
 Subjective:   Troy Boyer is a 73 y.o. who presents for a Medicare Wellness preventive visit.  As a reminder, Annual Wellness Visits don't include a physical exam, and some assessments may be limited, especially if this visit is performed virtually. We may recommend an in-person follow-up visit with your provider if needed.  Visit Complete: Virtual I connected with  Troy Boyer on 04/18/24 by a audio enabled telemedicine application and verified that I am speaking with the correct person using two identifiers.  Patient Location: Home  Provider Location: Office/Clinic  I discussed the limitations of evaluation and management by telemedicine. The patient expressed understanding and agreed to proceed.  Vital Signs: Because this visit was a virtual/telehealth visit, some criteria may be missing or patient reported. Any vitals not documented were not able to be obtained and vitals that have been documented are patient reported.  VideoDeclined- This patient declined Librarian, academic. Therefore the visit was completed with audio only.  Persons Participating in Visit: Patient.  AWV Questionnaire: Yes: Patient Medicare AWV questionnaire was completed by the patient on 10/18 and 04/15/24; I have confirmed that all information answered by patient is correct and no changes since this date.  Cardiac Risk Factors include: advanced age (>27men, >29 women);male gender;diabetes mellitus;dyslipidemia;hypertension;smoking/ tobacco exposure;Other (see comment), Risk factor comments: CAD,AAA     Objective:    Today's Vitals   04/18/24 0941  Weight: 187 lb (84.8 kg)  Height: 5' 4 (1.626 m)   Body mass index is 32.1 kg/m.     04/18/2024   10:08 AM 08/27/2023    6:45 PM 08/27/2023    1:18 PM 08/26/2023    3:55 PM 07/30/2023    6:40 PM 03/17/2022    1:14 PM 02/13/2016   12:35 PM  Advanced Directives  Does Patient Have a Medical Advance Directive? Yes Yes Yes Yes No  Yes Yes   Type of Estate agent of [place];Living will Healthcare Power of [place];Living will Healthcare Power of [place];Living will Healthcare Power of [place];Living will  Healthcare Power of [place];Living will Living will   Does patient want to make changes to medical advance directive? No - Patient declined No - Patient declined    No - Patient declined   Copy of Healthcare Power of Attorney in Chart? No - copy requested No - copy requested No - copy requested   No - copy requested No - copy requested      Data saved with a previous flowsheet row definition    Current Medications (verified) Outpatient Encounter Medications as of 04/18/2024  Medication Sig   Accu-Chek FastClix Lancets MISC Check blood sugars daily   amLODipine  (NORVASC ) 5 MG tablet Take 1 tablet (5 mg total) by mouth daily.   atorvastatin  (LIPITOR ) 80 MG tablet Take 1 tablet (80 mg total) by mouth at bedtime.   celecoxib  (CELEBREX ) 200 MG capsule Take 1 capsule (200 mg total) by mouth 2 (two) times daily.   ezetimibe  (ZETIA ) 10 MG tablet Take 1 tablet (10 mg total) by mouth daily.   fenofibrate  (TRICOR ) 48 MG tablet TAKE 1 TABLET BY MOUTH DAILY   glucose blood (ACCU-CHEK GUIDE) test strip Use daily to check blood sugar.  DX E11.65   Krill Oil 500 MG CAPS Take 500 mg by mouth 2 (two) times daily.   memantine  (NAMENDA ) 10 MG tablet Take 1 tablet (10 mg total) by mouth 2 (two) times daily.   metFORMIN  (GLUCOPHAGE -XR) 500 MG 24 hr tablet TAKE 2  TABLETS BY MOUTH TWICE  DAILY WITH FOOD (Patient taking differently: Takes 2 tablets every morning.)   metoprolol  tartrate (LOPRESSOR ) 25 MG tablet TAKE 1 TABLET BY MOUTH TWICE  DAILY   Multiple Vitamin (MULTIVITAMIN WITH MINERALS) TABS tablet Take 1 tablet by mouth daily.   nitroGLYCERIN  (NITROSTAT ) 0.4 MG SL tablet Place 1 tablet (0.4 mg total) under the tongue every 5 (five) minutes as needed for chest pain.   pioglitazone  (ACTOS ) 45 MG tablet Take 1  tablet (45 mg total) by mouth daily.   ramipril  (ALTACE ) 10 MG capsule TAKE 1 CAPSULE BY MOUTH TWICE  DAILY   tadalafil  (CIALIS ) 5 MG tablet Take 1 tablet (5 mg total) by mouth daily.   Testosterone  (ANDROGEL ) 20.25 MG/1.25GM (1.62%) GEL Apply 4 Pump topically daily.   traZODone  (DESYREL ) 50 MG tablet Take 1 tablet (50 mg total) by mouth at bedtime as needed for sleep.   Vibegron  (GEMTESA ) 75 MG TABS Take 1 tablet (75 mg total) by mouth daily.   aspirin  EC 81 MG tablet Take 1 tablet (81 mg total) by mouth daily. Swallow whole. (Patient not taking: Reported on 04/18/2024)   oxyCODONE  (ROXICODONE ) 5 MG immediate release tablet Take 1 tablet (5 mg total) by mouth every 4 (four) hours as needed for severe pain (pain score 7-10) or breakthrough pain. (Patient not taking: Reported on 04/18/2024)   No facility-administered encounter medications on file as of 04/18/2024.    Allergies (verified) Other and Protonix  [pantoprazole ]   History: Past Medical History:  Diagnosis Date   Adenomatous colon polyp    tubular   CAD (coronary artery disease)    a. Cath 06/08/01 at [facility] with normal LM and LAD, 95% LCx s/p Circumflex stent 4.0 x 15 mm Penta and 85% and 80% prox RCA s/p 3.5 x 23 mm Penta b. cath 01/20/2015 95% prox LCx ISR treated with DES, 55% prox to mid RCA, 45% distal RCA, 40% midLAD, EF 55%     Cataract    Diabetes mellitus without complication (HCC)    Diarrhea    Diverticulosis    Erectile dysfunction    Gallstones    Hyperlipidemia    Hypertension    Myocardial infarction [facility])    Past Surgical History:  Procedure Laterality Date   ANGIOPLASTY  2002   2 stents   BALLOON DILATION N/A 08/27/2023   Procedure: BALLOON DILATION;  Surgeon: Abran Norleen SAILOR, MD;  Location: [facility];  Service: Gastroenterology;  Laterality: N/A;   CARDIAC CATHETERIZATION N/A 01/20/2015   Procedure: Left Heart Cath and Coronary Angiography;  Surgeon: Victory LELON Sharps, MD;  Location: [facility] INVASIVE CV LAB;   Service: Cardiovascular;  Laterality: N/A;   CARDIAC CATHETERIZATION N/A 01/27/2016   Procedure: Left Heart Cath and Coronary Angiography;  Surgeon: Ozell Fell, MD;  Location: [facility] INVASIVE CV LAB;  Service: Cardiovascular;  Laterality: N/A;   CHOLECYSTECTOMY N/A 02/09/2016   Procedure: LAPAROSCOPIC CHOLECYSTECTOMY WITH INTRAOPERATIVE CHOLANGIOGRAM;  Surgeon: Debby Shipper, MD;  Location[facility] OR;  Service: General;  Laterality: N/A;   COLONOSCOPY W/ POLYPECTOMY  08/2013   Avg risk screening, Dr Arlester Aho. 5 mm tubular adenoma ascending.  Mild to moderated descending and sigmoid tics.  Internal hemorrhoids   CORONARY ANGIOPLASTY WITH STENT PLACEMENT     ERCP N/A 02/13/2016   Procedure: ENDOSCOPIC RETROGRADE CHOLANGIOPANCREATOGRAPHY (ERCP);  Surgeon: Victory LITTIE Legrand DOUGLAS, MD;  Location: [facility] ENDOSCOPY;  Service: Gastroenterology;  Laterality: N/A;   ERCP N/A 08/27/2023   Procedure: ENDOSCOPIC RETROGRADE CHOLANGIOPANCREATOGRAPHY (ERCP);  Surgeon: Abran Norleen SAILOR, MD;  Location: [facility];  Service: Gastroenterology;  Laterality: N/A;   REMOVAL OF STONES  08/27/2023   Procedure: REMOVAL OF STONES;  Surgeon: Abran Norleen SAILOR, MD;  Location: [facility];  Service: Gastroenterology;;   ROTATOR CUFF REPAIR Right 2009   ROTATOR CUFF REPAIR Left 2014   SPHINCTEROTOMY  08/27/2023   Procedure: SPHINCTEROTOMY;  Surgeon: Abran Norleen SAILOR, MD;  Location: [facility];  Service: Gastroenterology;;   TRIGGER FINGER RELEASE Bilateral 2013   4 fingers, middle finger on both hands   Family History  Problem Relation Age of Onset   Dementia Mother    Crohn's disease Mother    Heart disease Father 32       Valve replacement and CAD, CABG   Diabetes Father    CAD Sister 53   CAD Brother 58       Died age 52   Diabetes Brother    Kidney disease Brother    Colon cancer Neg Hx    Esophageal cancer Neg Hx    Stomach cancer Neg Hx    Rectal cancer Neg Hx    Pancreatic cancer Neg Hx    Social History   Socioeconomic  History   Marital status: Significant Other    Spouse name: Not on file   Number of children: 2   Years of education: 18   Highest education level: Bachelor's degree (e.g., BA, AB, BS)  Occupational History   Occupation: Airline pilot- retired in 2018    Employer: [employer]  Tobacco Use   Smoking status: Former    Current packs/day: 0.00    Average packs/day: 1 pack/day for 45.0 years (45.0 ttl pk-yrs)    Types: Cigarettes    Start date: 01/16/1970    Quit date: 01/17/2015    Years since quitting: 9.2   Smokeless tobacco: Never  Vaping Use   Vaping status: Never Used  Substance and Sexual Activity   Alcohol use: Not Currently    Alcohol/week: 1.0 standard drink of alcohol    Types: 1 Glasses of wine per week    Comment: Previously 1 large vodka tonic per day Quit 06/2023   Drug use: No   Sexual activity: Yes  Other Topics Concern   Not on file  Social History Narrative   Fun: Boat, golf   Denies religious beliefs effecting health care.    Social Drivers of Health   Financial Resource Strain: Medium Risk (04/15/2024)   Overall Financial Resource Strain (CARDIA)    Difficulty of Paying Living Expenses: Somewhat hard  Food Insecurity: No Food Insecurity (04/15/2024)   Hunger Vital Sign    Worried About Running Out of Food in the Last Year: Never true    Ran Out of Food in the Last Year: Never true  Transportation Needs: No Transportation Needs (04/15/2024)   PRAPARE - Administrator, Civil Service (Medical): No    Lack of Transportation (Non-Medical): No  Physical Activity: Inactive (04/18/2024)   Exercise Vital Sign    Days of Exercise per Week: Not on file    Minutes of Exercise per Session: 0 min  Stress: No Stress Concern Present (04/15/2024)   [employer] of Occupational Health - Occupational Stress Questionnaire    Feeling of Stress: Only a little  Recent Concern: Stress - Stress Concern Present (02/18/2024)   [employer] of Occupational  Health - Occupational Stress Questionnaire    Feeling of Stress: To some extent  Social Connections: Moderately Isolated (  04/15/2024)   Social Connection and Isolation Panel    Frequency of Communication with Friends and Family: More than three times a week    Frequency of Social Gatherings with Friends and Family: More than three times a week    Attends Religious Services: Never    Database administrator or Organizations: No    Attends Engineer, structural: Not on file    Marital Status: Living with partner    Tobacco Counseling Counseling given: Not Answered    Clinical Intake:  Pre-visit preparation completed: Yes        BMI - recorded: 32.1 Nutritional Status: BMI > 30  Obese Nutritional Risks: None Diabetes: Yes CBG done?: No Did pt. bring in CBG monitor from home?: No  Lab Results  Component Value Date   HGBA1C 7.2 (H) 02/06/2024   HGBA1C 7.6 (H) 08/22/2023   HGBA1C 7.1 (H) 04/25/2023     How often do you need to have someone help you when you read instructions, pamphlets, or other written materials from your doctor or pharmacy?: 1 - Never What is the last grade level you completed in school?: bachelor's degree  Interpreter Needed?: No  Information entered by :: Lolita Libra, CMA(AAMA)   Activities of Daily Living     04/14/2024    8:06 AM 08/27/2023    6:45 PM  In your present state of health, do you have any difficulty performing the following activities:  Hearing? 0 0  Vision? 0 0  Difficulty concentrating or making decisions? 0 0  Walking or climbing stairs? 0   Dressing or bathing? 0   Doing errands, shopping? 0 0  Preparing Food and eating ? N   Using the Toilet? N   In the past six months, have you accidently leaked urine? Y   Comment Sees Dr Roseann   Do you have problems with loss of bowel control? Y   Comment diarrhea side effects from a couple of medications that he is no longer taking.   Managing your Medications? N    Managing your Finances? N   Housekeeping or managing your Housekeeping? N     Patient Care Team: Frann Mabel Mt, DO as PCP - General (Family Medicine) Pietro Redell RAMAN, MD as PCP - Cardiology (Cardiology) Joshua Rush, OD (Optometry) Group, [facility]  I have updated your Care Teams any recent Medical Services you may have received from other providers in the past year.     Assessment:   This is a routine wellness examination for [place].  Hearing/Vision screen Hearing Screening - Comments:: Denies hearing difficulties.  Vision Screening - Comments:: Up to date with routine eye exams with [facility]   Goals Addressed   None    Depression Screen     04/18/2024    9:55 AM 02/24/2024   10:37 AM 10/25/2023   10:37 AM 04/25/2023   12:41 PM 10/20/2022   10:32 AM 03/17/2022    1:15 PM 10/19/2021   10:45 AM  PHQ 2/9 Scores  PHQ - 2 Score 1 0 0 0 0 0 0  PHQ- 9 Score 4 6 0  0      Fall Risk     04/14/2024    8:06 AM 02/24/2024   10:36 AM 10/25/2023   10:37 AM 04/25/2023   12:41 PM 10/20/2022   10:31 AM  Fall Risk   Falls in the past year? 0 0 0 0 1  Number falls in past yr: 0  0 0 0 0  Comment     does not remember the fall.  Injury with Fall? 0 0 0 0 0  Comment     black eye  Risk for fall due to : Impaired vision No Fall Risks   No Fall Risks  Follow up Education provided  Falls evaluation completed Falls evaluation completed Falls evaluation completed    MEDICARE RISK AT HOME:  Medicare Risk at Home Any stairs in or around the home?: (Patient-Rptd) Yes If so, are there any without handrails?: (Patient-Rptd) No Home free of loose throw rugs in walkways, pet beds, electrical cords, etc?: (Patient-Rptd) No Adequate lighting in your home to reduce risk of falls?: (Patient-Rptd) Yes Life alert?: (Patient-Rptd) No Use of a cane, walker or w/c?: (Patient-Rptd) No Grab bars in the bathroom?: (Patient-Rptd) Yes Shower chair or bench in shower?: (Patient-Rptd)  No Elevated toilet seat or a handicapped toilet?: (Patient-Rptd) No  TIMED UP AND GO:  Was the test performed?  No, audio  Cognitive Function: Impaired: Patient has current diagnosis of cognitive impairment.    02/06/2024    1:59 PM  MMSE - Mini Mental State Exam  Orientation to time 5  Orientation to Place 5  Registration 3  Attention/ Calculation 3  Recall 0  Language- name 2 objects 2  Language- repeat 1  Language- follow 3 step command 3  Language- read & follow direction 1  Write a sentence 1  Copy design 1  Total score 25      02/06/2024    1:51 PM  Montreal Cognitive Assessment   Visuospatial/ Executive (0/5) 2  Naming (0/3) 3  Attention: Read list of digits (0/2) 1  Attention: Read list of letters (0/1) 1  Attention: Serial 7 subtraction starting at 100 (0/3) 1  Language: Repeat phrase (0/2) 2  Language : Fluency (0/1) 0  Abstraction (0/2) 2  Delayed Recall (0/5) 2  Orientation (0/6) 6  Total 20      03/17/2022    1:23 PM  6CIT Screen  What Year? 0 points  What month? 0 points  What time? 0 points  Count back from 20 0 points  Months in reverse 0 points  Repeat phrase 0 points  Total Score 0 points    Immunizations Immunization History  Administered Date(s) Administered   Fluad Quad(high Dose 65+) 04/13/2019, 04/17/2021   Fluad Trivalent(High Dose 65+) 04/25/2023   INFLUENZA, HIGH DOSE SEASONAL PF 07/14/2017, 05/11/2018, 04/01/2020, 03/06/2022   Influenza-Unspecified 07/14/2017, 03/06/2022   PFIZER(Purple Top)SARS-COV-2 Vaccination 07/23/2019, 08/13/2019, 03/31/2020, 10/09/2020   PNEUMOCOCCAL CONJUGATE-20 04/17/2021   Pfizer Covid-19 Vaccine Bivalent Booster 57yrs & up 06/16/2021   Pfizer(Comirnaty)Fall Seasonal Vaccine 12 years and older 04/08/2022   Pneumococcal Conjugate-13 04/27/2017   Pneumococcal Polysaccharide-23 04/22/2016   Tdap 04/24/2015   Zoster Recombinant(Shingrix) 11/01/2017, 04/27/2018    Screening Tests Health Maintenance   Topic Date Due   Medicare Annual Wellness (AWV)  03/18/2023   Influenza Vaccine  01/27/2024   COVID-19 Vaccine (7 - 2025-26 season) 02/27/2024   Lung Cancer Screening  07/21/2024   HEMOGLOBIN A1C  08/08/2024   OPHTHALMOLOGY EXAM  09/13/2024   Diabetic kidney evaluation - Urine ACR  10/24/2024   FOOT EXAM  10/24/2024   Diabetic kidney evaluation - eGFR measurement  02/05/2025   DTaP/Tdap/Td (2 - Td or Tdap) 04/23/2025   Colonoscopy  02/17/2028   Pneumococcal Vaccine: 50+ Years  Completed   Hepatitis C Screening  Completed   Zoster Vaccines- Shingrix  Completed  Meningococcal B Vaccine  Aged Out    Health Maintenance Items Addressed: Will get flu and Covid vaccine at pharmacy. Lung cancer screening ordered  Additional Screening:  Vision Screening: Recommended annual ophthalmology exams for early detection of glaucoma and other disorders of the eye. Is the patient up to date with their annual eye exam?  Yes  Who is the provider or what is the name of the office in which the patient attends annual eye exams? [facility]  Dental Screening: Recommended annual dental exams for proper oral hygiene  Community Resource Referral / Chronic Care Management: CRR required this visit?  No   CCM required this visit?  No   Plan:    I have personally reviewed and noted the following in the patient's chart:   Medical and social history Use of alcohol, tobacco or illicit drugs  Current medications and supplements including opioid prescriptions. Patient is not currently taking opioid prescriptions. Functional ability and status Nutritional status Physical activity Advanced directives List of other physicians Hospitalizations, surgeries, and ER visits in previous 12 months Vitals Screenings to include cognitive, depression, and falls Referrals and appointments  In addition, I have reviewed and discussed with patient certain preventive protocols, quality metrics, and best practice  recommendations. A written personalized care plan for preventive services as well as general preventive health recommendations were provided to patient.   Lolita Libra, CMA   04/18/2024   After Visit Summary: (MyChart) Due to this being a telephonic visit, the after visit summary with patients personalized plan was offered to patient via MyChart   Notes: see phone note

## 2024-04-18 NOTE — Telephone Encounter (Signed)
 Pt had AWV today.   States trazadone doesn't seem to be helping. Sill waking up multiple times a night and feels severely sleep deprived.  Had eye exam regarding double vision and was told they couldn't find any cause and he is still having double vision.  What is next step? (I have requested a copy of most recent exam from [facility]).

## 2024-04-19 ENCOUNTER — Encounter (HOSPITAL_BASED_OUTPATIENT_CLINIC_OR_DEPARTMENT_OTHER): Admitting: Orthopaedic Surgery

## 2024-04-19 NOTE — Telephone Encounter (Signed)
 Sched appt plz. Thx.

## 2024-04-19 NOTE — Telephone Encounter (Signed)
 Appt made.

## 2024-04-23 ENCOUNTER — Encounter: Payer: Self-pay | Admitting: Neurology

## 2024-04-24 ENCOUNTER — Ambulatory Visit: Admitting: Family Medicine

## 2024-04-24 ENCOUNTER — Encounter: Payer: Self-pay | Admitting: Family Medicine

## 2024-04-24 VITALS — BP 134/82 | HR 68 | Temp 98.0°F | Resp 16 | Ht 64.0 in | Wt 189.4 lb

## 2024-04-24 DIAGNOSIS — G4700 Insomnia, unspecified: Secondary | ICD-10-CM

## 2024-04-24 MED ORDER — MEMANTINE HCL 5 MG PO TABS: 5.0000 mg | ORAL_TABLET | Freq: Two times a day (BID) | ORAL | Status: DC

## 2024-04-24 MED ORDER — PRAZOSIN HCL 2 MG PO CAPS
2.0000 mg | ORAL_CAPSULE | Freq: Every day | ORAL | 1 refills | Status: AC
Start: 2024-04-24 — End: ?

## 2024-04-24 NOTE — Patient Instructions (Addendum)
 Try not to nap.   Stay active.   Let us  know if you need anything.

## 2024-04-24 NOTE — Progress Notes (Signed)
 Chief Complaint  Patient presents with   Follow-up    Follow Up    Subjective: Patient is a 73 y.o. male here for f/u. Here w wife who helps w the hx.   This has been going on for years. He has no issues falling asleep. No caffeine after noon or alcohol use. Not anxious. He does not have racing thoughts. Able to pee and go back to sleep relatively quickly. Does not feel well rested in the AM. He does nap during the day, usually around 1 hr. he is dealing with Alzheimer's dementia.  He does not feel well rested in the morning.  Currently taking trazodone  50 mg nightly which does help him fall asleep but not stay asleep.  Past Medical History:  Diagnosis Date   Adenomatous colon polyp    tubular   CAD (coronary artery disease)    a. Cath 06/08/01 at [facility] with normal LM and LAD, 95% LCx s/p Circumflex stent 4.0 x 15 mm Penta and 85% and 80% prox RCA s/p 3.5 x 23 mm Penta b. cath 01/20/2015 95% prox LCx ISR treated with DES, 55% prox to mid RCA, 45% distal RCA, 40% midLAD, EF 55%     Cataract    Diabetes mellitus without complication (HCC)    Diarrhea    Diverticulosis    Erectile dysfunction    Gallstones    Hyperlipidemia    Hypertension    Myocardial infarction (HCC)     Objective: BP 134/82 (BP Location: Left Arm, Patient Position: Sitting)   Pulse 68   Temp 98 F (36.7 C) (Oral)   Resp 16   Ht 5' 4 (1.626 m)   Wt 189 lb 6.4 oz (85.9 kg)   SpO2 99%   BMI 32.51 kg/m  General: Awake, appears stated age Lungs: No accessory muscle use Psych: Normal affect, limited judgment/insight  Assessment and Plan: Insomnia, unspecified type - Plan: prazosin (MINIPRESS) 2 MG capsule  Chronic, not controlled.  Stop trazodone .  Start prazosin 2 mg nightly.  There may be a prostate component to this.  Try not to nap during the day.  Stay active.  Follow-up in 1 month to recheck this. The patient and his spouse voiced understanding and agreement to the plan.  Mabel Mt Arion,  DO 04/24/24  11:58 AM

## 2024-04-27 ENCOUNTER — Encounter (HOSPITAL_BASED_OUTPATIENT_CLINIC_OR_DEPARTMENT_OTHER): Admitting: Orthopaedic Surgery

## 2024-04-28 ENCOUNTER — Other Ambulatory Visit: Payer: Self-pay | Admitting: Family Medicine

## 2024-04-28 DIAGNOSIS — I10 Essential (primary) hypertension: Secondary | ICD-10-CM

## 2024-04-28 DIAGNOSIS — I2510 Atherosclerotic heart disease of native coronary artery without angina pectoris: Secondary | ICD-10-CM

## 2024-04-30 ENCOUNTER — Telehealth: Payer: Self-pay | Admitting: Neurology

## 2024-04-30 ENCOUNTER — Encounter: Payer: Self-pay | Admitting: Radiology

## 2024-04-30 NOTE — Telephone Encounter (Signed)
 Patient's wife called to accept and reschedule from the waitlist.

## 2024-05-06 MED ORDER — RIVASTIGMINE TARTRATE 1.5 MG PO CAPS: 1.5000 mg | ORAL_CAPSULE | Freq: Two times a day (BID) | ORAL | 3 refills | Status: AC

## 2024-05-08 ENCOUNTER — Other Ambulatory Visit: Payer: Self-pay | Admitting: Orthopaedic Surgery

## 2024-05-17 ENCOUNTER — Other Ambulatory Visit (HOSPITAL_BASED_OUTPATIENT_CLINIC_OR_DEPARTMENT_OTHER): Payer: Self-pay | Admitting: Orthopaedic Surgery

## 2024-05-17 ENCOUNTER — Encounter (HOSPITAL_BASED_OUTPATIENT_CLINIC_OR_DEPARTMENT_OTHER): Payer: Self-pay | Admitting: Orthopaedic Surgery

## 2024-05-17 ENCOUNTER — Ambulatory Visit (HOSPITAL_COMMUNITY)
Admission: RE | Admit: 2024-05-17 | Discharge: 2024-05-17 | Disposition: A | Discharge status: Home / Self Care | Source: Ambulatory Visit | Attending: Orthopaedic Surgery | Admitting: Orthopaedic Surgery

## 2024-05-17 DIAGNOSIS — G8929 Other chronic pain: Secondary | ICD-10-CM

## 2024-05-17 DIAGNOSIS — M19011 Primary osteoarthritis, right shoulder: Secondary | ICD-10-CM | POA: Diagnosis not present

## 2024-05-17 NOTE — Progress Notes (Signed)
 Date of Surgery: 05/17/2024  INDICATIONS: Mr. Bloodworth is a 73 y.o.-year-old male with right shoulder osteoarthritis.  The risk and benefits of the procedure were discussed in detail and documented in the pre-operative evaluation.   PREOPERATIVE DIAGNOSIS: 1. Right shoulder osteoarthritis  POSTOPERATIVE DIAGNOSIS: Same.  PROCEDURE: 1. Right shoulder reverse shoulder arthroplasty  SURGEON: Elspeth LITTIE Parker MD  ASSISTANT: Conley Dawson, ATC  ANESTHESIA:  general  IV FLUIDS AND URINE: See anesthesia record.  ANTIBIOTICS: Ancef  ESTIMATED BLOOD LOSS: 10 mL.  IMPLANTS:  * No surgical log found *  DRAINS: None  CULTURES: None  COMPLICATIONS: none  DESCRIPTION OF PROCEDURE:   Patient was identified in the preoperative holding area.  Anesthesia performed an interscalene nerve block after universal timeout was performed with nursing.  Ancef was given 1 hour prior to skin incision.    The surgical site was scrubbed with a chlorhexidine scrub brush and alcohol.  The patient was then prepped with chlorhexidine skin prep.  The patient was subsequently taken back to the operating room.  Anesthesia was induced. The patient was transferred to the beachchair position.  All bony prominences were padded.  Final timeout was again performed.     The bony landmarks of the shoulder were marked with a marking pen. A delto-pectoral incision was made, extending up approximately 5 inches. The wound with then irrigated with dilute betadine. Cephalic vein was identified, and an protected. This was retracted medially. Subdeltoid and subpectoral lesions were released. Neurovascular structures were carefully protected. The Gelpi retractor was used to retract the deltoid and pectoralis major.    The deltoid was retracted laterally with a Brown humeral retractor.  The conjoined tendon was identified. The cleido-pectoral fascia was excised.  The axillary nerve was palpated and carefully protected throughout  the procedure. The subscap was tagged with a #2 Ethibond.  At this point the subscapularis was peeled off from the lesser tuberosity with care to avoid dissection distally in order to protect the axillary nerve.  Once the joint was exposed the proximal humerus was delivered with external rotation and extension of the arm. The humerus was prepped initially by performing a humeral neck cut. This was done with the guide using 30 degrees of retroversion as a reference.  The head portion was removed.  A medullary sounding reamer was then used.  We subsequently placed our guidewire through the center of the humeral head using the reference guide.  This was a size 2.  Metaphyseal reamer was then used.  Finally the size 2 broach was malleted into place with excellent purchase.  A tonsil clamp was used to attempt to pull this out with very good purchase   Attention was then turned to the glenoid.  Posteriorly a large Darach retractor was used.  A 360 Degree release of the subscapularis and glenoid were done. The capsule was released from the humerus.    Glenoid retractors were placed posteriorly, superiorly behind the biceps tendon and anteriorly on the glenoid neck. A 360-degree release of the capsule was performed with cautery.  The triceps was released off the inferior tubercle of the glenoid. The axillary nerve was carefully protected with the surgeon's index finger, retracting it and using cautery.   A guidepin was placed through the glenoid guide. The guidepin was drilled until it exited the cortex. The guidepin was over drilled. Next, the glenoid was prepared with the reamer down to cortical bone.  The central peg hole was totally within the scapular neck tested  with the probe.  The baseplate was then placed screwed securely with good purchase in position and then secured with 4 screws. In each case, they were drilled and measured and the appropriate length screw placed with excellent rigid fixation of the  baseplate. The glenosphere was placed with size based based on pre-operative templating.   The humerus was then delivered and a neutral polyethylene trial was placed.  This was brought to just the level of the reduction but not completely reduced.  A 0 retentive final poly was selected and impacted.    Appropriate tension was noted on the conjoined tendon and deltoid muscle.  Extension was stable, external and internal rotation as well.  The subscap was pulled over but as this was not able to reach comfortably decision was made not to repair in order to prevent limited in external rotation.  The wound was then irrigated. Vancomycin powder was placed in the wound again for infection prevention.   The wound was then closed in layers with 0 Vicryl interrupted in the deep subcu followed by 2-0 Vicryl in the superficial subcu and 3-0 nylon for skin.  An Aquacel dressing was applied as well as an Veterinary surgeon.  A shoulder immobilizer was applied.     Postoperative Plan: -The patient will begin the reverse shoulder rehab protocol  -Aspirin  325 mg daily will be used for 4 weeks for blood clot prevention -I will see the patient back in 2 weeks for first postoperative wound check    Elspeth LITTIE Parker, MD 4:00 PM

## 2024-05-18 ENCOUNTER — Other Ambulatory Visit (HOSPITAL_BASED_OUTPATIENT_CLINIC_OR_DEPARTMENT_OTHER): Payer: Self-pay | Admitting: Orthopaedic Surgery

## 2024-05-18 ENCOUNTER — Telehealth: Payer: Self-pay | Admitting: Orthopaedic Surgery

## 2024-05-18 DIAGNOSIS — M25511 Pain in right shoulder: Secondary | ICD-10-CM

## 2024-05-18 NOTE — Telephone Encounter (Signed)
 Patient called. He would like the referral for PT sent to [facility] on [address] [facility] in [place].

## 2024-05-21 ENCOUNTER — Telehealth: Payer: Self-pay

## 2024-05-21 NOTE — Telephone Encounter (Signed)
 noted

## 2024-05-21 NOTE — Telephone Encounter (Signed)
 [facility] called stating that they have some questions.  Would like to know how long will patient use the ice machine, when will PT call to schedule for visits, and that patient has 2  slings.  CB# [phone].  Please advise.  Thank you.

## 2024-05-22 ENCOUNTER — Encounter: Payer: Self-pay | Admitting: Neurology

## 2024-05-22 ENCOUNTER — Ambulatory Visit (INDEPENDENT_AMBULATORY_CARE_PROVIDER_SITE_OTHER): Admitting: Student

## 2024-05-22 ENCOUNTER — Telehealth (HOSPITAL_BASED_OUTPATIENT_CLINIC_OR_DEPARTMENT_OTHER): Payer: Self-pay

## 2024-05-22 ENCOUNTER — Ambulatory Visit: Admitting: Neurology

## 2024-05-22 VITALS — BP 122/70 | HR 85 | Ht 64.0 in | Wt 190.6 lb

## 2024-05-22 DIAGNOSIS — G3184 Mild cognitive impairment, so stated: Secondary | ICD-10-CM | POA: Diagnosis not present

## 2024-05-22 DIAGNOSIS — M19011 Primary osteoarthritis, right shoulder: Secondary | ICD-10-CM

## 2024-05-22 DIAGNOSIS — G309 Alzheimer's disease, unspecified: Secondary | ICD-10-CM

## 2024-05-22 NOTE — Progress Notes (Signed)
 Patient presented to clinic today for sling application and adjustment.  He is 5 days status post right reverse total shoulder arthroplasty.  Also answered postop questions regarding sling usage and rehab protocols.  They have first postop visit scheduled on 12/4.  Discussed that they are always welcome to follow-up with any other questions via MyChart should these arise.   Leonce Reveal, PA-C Orthopedics

## 2024-05-22 NOTE — Progress Notes (Signed)
 Provider:  Dedra Gores, MD  Primary Care Physician:  Frann Mabel Mt, DO [address] [place] [address]     Referring Provider: Frann Mabel Mt [place] [address] [place],  [place] [address]          Chief Complaint according to patient   Patient presents with:                HISTORY OF PRESENT ILLNESS:  Troy Boyer is a 73 y.o. male patient who is here for revisit 05/22/2024 for MCI;  MCI : Related to sepsis , hypotension, and  repeated surgery with  prolonged anesthesia. . Another general anaesthesia for shoulder surgery in right shoulder. History of CAD, stenting. Consumes Vodka nightly.    ATN : positive only for amyloid.   MRI white matter disease.   PET : Mildly POSITIVE SCAN for brain amyloid is most consistent with the presence of moderate to frequent neuritic beta-amyloid plaques in the brain. A positive scan demonstrates the presence of AD pathology.   Ct 2017 : Moderate ventriculomegaly, with disproportionate sulcal effacement at the convexities. No interstitial/ transependymal edema. No intraparenchymal hemorrhage, mass effect, midline shift or acute large vascular territory infarcts. No abnormal extra-axial fluid collections. Basal cisterns are patent. Mild calcific atherosclerosis of the carotid siphons.      05/22/2024    1:22 PM 02/06/2024    1:51 PM  Montreal Cognitive Assessment   Visuospatial/ Executive (0/5) 0 2  Naming (0/3) 3 3  Attention: Read list of digits (0/2) 1 1  Attention: Read list of letters (0/1) 1 1  Attention: Serial 7 subtraction starting at 100 (0/3) 1 1  Language: Repeat phrase (0/2) 2 2  Language : Fluency (0/1) 0 0  Abstraction (0/2) 2 2  Delayed Recall (0/5) 3 2  Orientation (0/6) 5 6  Total 18 20/ 30  Adjusted Score (based on education) 18/ 25    .   Fam Hx : see previous note  Social HX; see previous note     Review of Systems: Out of a  complete 14 system review, the patient complains of only the following symptoms, and all other reviewed systems are negative.:    Social History   Socioeconomic History   Marital status: Significant Other    Spouse name: Not on file   Number of children: 2   Years of education: 45   Highest education level: Bachelor's degree (e.g., BA, AB, BS)  Occupational History   Occupation: Airline Pilot- retired in 2018    Employer: [employer]  Tobacco Use   Smoking status: Former    Current packs/day: 0.00    Average packs/day: 1 pack/day for 45.0 years (45.0 ttl pk-yrs)    Types: Cigarettes    Start date: 01/16/1970    Quit date: 01/17/2015    Years since quitting: 9.3   Smokeless tobacco: Never  Vaping Use   Vaping status: Never Used  Substance and Sexual Activity   Alcohol use: Not Currently    Alcohol/week: 1.0 standard drink of alcohol    Types: 1 Glasses of wine per week    Comment: Previously 1 large vodka tonic per day Quit 06/2023   Drug use: No   Sexual activity: Yes  Other Topics Concern   Not on file  Social History Narrative   Fun: Boat, golf   Denies religious beliefs effecting health care.  1 cup of coffee in the morning.    Social Drivers of Health   Financial Resource Strain: Medium Risk (04/15/2024)   Overall Financial Resource Strain (CARDIA)    Difficulty of Paying Living Expenses: Somewhat hard  Food Insecurity: No Food Insecurity (04/15/2024)   Hunger Vital Sign    Worried About Running Out of Food in the Last Year: Never true    Ran Out of Food in the Last Year: Never true  Transportation Needs: No Transportation Needs (04/15/2024)   PRAPARE - Administrator, Civil Service (Medical): No    Lack of Transportation (Non-Medical): No  Physical Activity: Inactive (04/18/2024)   Exercise Vital Sign    Days of Exercise per Week: Not on file    Minutes of Exercise per Session: 0 min  Stress: No Stress Concern Present (04/15/2024)   [employer] of Occupational Health - Occupational Stress Questionnaire    Feeling of Stress: Only a little  Recent Concern: Stress - Stress Concern Present (02/18/2024)   [employer] of Occupational Health - Occupational Stress Questionnaire    Feeling of Stress: To some extent  Social Connections: Moderately Isolated (04/15/2024)   Social Connection and Isolation Panel    Frequency of Communication with Friends and Family: More than three times a week    Frequency of Social Gatherings with Friends and Family: More than three times a week    Attends Religious Services: Never    Database Administrator or Organizations: No    Attends Engineer, Structural: Not on file    Marital Status: Living with partner    Family History  Problem Relation Age of Onset   Stroke Mother    Dementia Mother    Crohn's disease Mother    Heart disease Father 56       Valve replacement and CAD, CABG   Diabetes Father    CAD Sister 57   Sleep apnea Brother    CAD Brother 76       Died age 35   Diabetes Brother    Kidney disease Brother    Colon cancer Neg Hx    Esophageal cancer Neg Hx    Stomach cancer Neg Hx    Rectal cancer Neg Hx    Pancreatic cancer Neg Hx    Seizures Neg Hx    Migraines Neg Hx     Past Medical History:  Diagnosis Date   Adenomatous colon polyp    tubular   CAD (coronary artery disease)    a. Cath 06/08/01 at [facility] with normal LM and LAD, 95% LCx s/p Circumflex stent 4.0 x 15 mm Penta and 85% and 80% prox RCA s/p 3.5 x 23 mm Penta b. cath 01/20/2015 95% prox LCx ISR treated with DES, 55% prox to mid RCA, 45% distal RCA, 40% midLAD, EF 55%     Cataract    Diabetes mellitus without complication (HCC)    Diarrhea    Diverticulosis    Erectile dysfunction    Gallstones    Hyperlipidemia    Hypertension    Myocardial infarction [facility])     Past Surgical History:  Procedure Laterality Date   ANGIOPLASTY  2002   2 stents   BALLOON DILATION N/A 08/27/2023    Procedure: BALLOON DILATION;  Surgeon: Abran Norleen SAILOR, MD;  Location: [facility];  Service: Gastroenterology;  Laterality: N/A;   CARDIAC CATHETERIZATION N/A 01/20/2015   Procedure: Left Heart Cath and Coronary Angiography;  Surgeon:  Victory LELON Sharps, MD;  Location: [facility] INVASIVE CV LAB;  Service: Cardiovascular;  Laterality: N/A;   CARDIAC CATHETERIZATION N/A 01/27/2016   Procedure: Left Heart Cath and Coronary Angiography;  Surgeon: Ozell Fell, MD;  Location: [facility] INVASIVE CV LAB;  Service: Cardiovascular;  Laterality: N/A;   CHOLECYSTECTOMY N/A 02/09/2016   Procedure: LAPAROSCOPIC CHOLECYSTECTOMY WITH INTRAOPERATIVE CHOLANGIOGRAM;  Surgeon: Debby Shipper, MD;  Location: [facility] OR;  Service: General;  Laterality: N/A;   COLONOSCOPY W/ POLYPECTOMY  08/2013   Avg risk screening, Dr Arlester Aho. 5 mm tubular adenoma ascending.  Mild to moderated descending and sigmoid tics.  Internal hemorrhoids   CORONARY ANGIOPLASTY WITH STENT PLACEMENT     ERCP N/A 02/13/2016   Procedure: ENDOSCOPIC RETROGRADE CHOLANGIOPANCREATOGRAPHY (ERCP);  Surgeon: Victory LITTIE Legrand DOUGLAS, MD;  Location: [facility] ENDOSCOPY;  Service: Gastroenterology;  Laterality: N/A;   ERCP N/A 08/27/2023   Procedure: ENDOSCOPIC RETROGRADE CHOLANGIOPANCREATOGRAPHY (ERCP);  Surgeon: Abran Norleen SAILOR, MD;  Location: [facility];  Service: Gastroenterology;  Laterality: N/A;   REMOVAL OF STONES  08/27/2023   Procedure: REMOVAL OF STONES;  Surgeon: Abran Norleen SAILOR, MD;  Location: [facility];  Service: Gastroenterology;;   ROTATOR CUFF REPAIR Right 2009   ROTATOR CUFF REPAIR Left 2014   SPHINCTEROTOMY  08/27/2023   Procedure: SPHINCTEROTOMY;  Surgeon: Abran Norleen SAILOR, MD;  Location: [facility] ENDOSCOPY;  Service: Gastroenterology;;   AVA FINGER RELEASE Bilateral 2013   4 fingers, middle finger on both hands     Current Outpatient Medications on File Prior to Visit  Medication Sig Dispense Refill   aspirin  EC 81 MG tablet Take 1 tablet (81 mg total) by mouth daily. Swallow  whole.     metFORMIN  (GLUCOPHAGE -XR) 500 MG 24 hr tablet TAKE 2 TABLETS BY MOUTH TWICE  DAILY WITH FOOD (Patient taking differently: Takes 2 tablets every morning.) 360 tablet 3   Accu-Chek FastClix Lancets MISC Check blood sugars daily 102 each 12   amLODipine  (NORVASC ) 5 MG tablet TAKE 1 TABLET BY MOUTH DAILY 90 tablet 3   atorvastatin  (LIPITOR ) 80 MG tablet TAKE 1 TABLET BY MOUTH AT  BEDTIME 90 tablet 3   celecoxib  (CELEBREX ) 200 MG capsule Take 1 capsule by mouth twice daily 30 capsule 0   ezetimibe  (ZETIA ) 10 MG tablet Take 1 tablet (10 mg total) by mouth daily. 90 tablet 2   fenofibrate  (TRICOR ) 48 MG tablet TAKE 1 TABLET BY MOUTH DAILY 90 tablet 3   glucose blood (ACCU-CHEK GUIDE) test strip Use daily to check blood sugar.  DX E11.65 100 each 3   Krill Oil 500 MG CAPS Take 500 mg by mouth 2 (two) times daily.     metoprolol  tartrate (LOPRESSOR ) 25 MG tablet TAKE 1 TABLET BY MOUTH TWICE  DAILY 180 tablet 3   Multiple Vitamin (MULTIVITAMIN WITH MINERALS) TABS tablet Take 1 tablet by mouth daily.     nitroGLYCERIN  (NITROSTAT ) 0.4 MG SL tablet Place 1 tablet (0.4 mg total) under the tongue every 5 (five) minutes as needed for chest pain. 25 tablet 3   pioglitazone  (ACTOS ) 45 MG tablet Take 1 tablet (45 mg total) by mouth daily. 90 tablet 3   prazosin  (MINIPRESS ) 2 MG capsule Take 1 capsule (2 mg total) by mouth at bedtime. 30 capsule 1   ramipril  (ALTACE ) 10 MG capsule TAKE 1 CAPSULE BY MOUTH TWICE  DAILY 180 capsule 3   rivastigmine  (EXELON ) 1.5 MG capsule Take 1 capsule (1.5 mg total) by mouth 2 (two) times daily. 60 capsule 3  tadalafil  (CIALIS ) 5 MG tablet Take 1 tablet (5 mg total) by mouth daily. 30 tablet 11   Testosterone  (ANDROGEL ) 20.25 MG/1.25GM (1.62%) GEL Apply 4 Pump topically daily. 150 g 5   Vibegron  (GEMTESA ) 75 MG TABS Take 1 tablet (75 mg total) by mouth daily. 30 tablet 11   No current facility-administered medications on file prior to visit.    Allergies  Allergen  Reactions   Other Rash    ECG electrodes, per patient   Protonix  [Pantoprazole ] Other (See Comments)    Exacerbated reflux s/s's     DIAGNOSTIC DATA (LABS, IMAGING, TESTING) - I reviewed patient records, labs, notes, testing and imaging myself where available.  Lab Results  Component Value Date   WBC 6.7 02/06/2024   HGB 13.8 02/06/2024   HCT 42.5 02/06/2024   MCV 93 02/06/2024   PLT 262 02/06/2024      Component Value Date/Time   NA 140 02/06/2024 1507   K 4.4 02/06/2024 1507   CL 102 02/06/2024 1507   CO2 23 02/06/2024 1507   GLUCOSE 205 (H) 02/06/2024 1507   GLUCOSE 169 (H) 10/05/2023 1412   BUN 19 02/06/2024 1507   CREATININE 1.31 (H) 02/06/2024 1507   CREATININE 0.90 09/18/2014 0927   CALCIUM  9.5 02/06/2024 1507   PROT 6.4 02/06/2024 1507   ALBUMIN 4.4 02/06/2024 1507   AST 22 02/06/2024 1507   ALT 20 02/06/2024 1507   ALKPHOS 48 02/06/2024 1507   BILITOT 0.5 02/06/2024 1507   GFRNONAA >60 08/30/2023 0427   GFRNONAA >89 09/18/2014 0927   GFRAA 75 05/28/2020 1056   GFRAA >89 09/18/2014 0927   Lab Results  Component Value Date   CHOL 118 02/24/2024   HDL 51.20 02/24/2024   LDLCALC 49 02/24/2024   LDLDIRECT 74.0 05/18/2019   TRIG 91.0 02/24/2024   CHOLHDL 2 02/24/2024   Lab Results  Component Value Date   HGBA1C 7.2 (H) 02/06/2024   Lab Results  Component Value Date   VITAMINB12 556 02/06/2024   Lab Results  Component Value Date   TSH 0.892 02/06/2024    PHYSICAL EXAM:  Vitals:   05/22/24 1313  BP: 122/70  Pulse: 85  SpO2: 97%   No data found. Body mass index is 32.72 kg/m.   Wt Readings from Last 3 Encounters:  05/22/24 190 lb 9.6 oz (86.5 kg)  04/24/24 189 lb 6.4 oz (85.9 kg)  04/18/24 187 lb (84.8 kg)     Ht Readings from Last 3 Encounters:  05/22/24 5' 4 (1.626 m)  04/24/24 5' 4 (1.626 m)  04/18/24 5' 4 (1.626 m)      General: The patient is awake, alert and appears not in acute distress and groomed. Head: Normocephalic,  atraumatic.  Neck is supple.The patient is awake, alert and appears not in acute distress. The patient is well groomed. Head: Normocephalic, atraumatic. Neck is supple.  Mallampati , 3 plus.  neck circumference:18 inches .  Nasal airflow patent.   Retrognathia is  seen.  Dental status: biological  Cardiovascular:  Regular rate and cardiac rhythm by pulse,  without distended neck veins. Respiratory: Lungs are clear to auscultation.  Skin:  Without evidence of ankle edema, or rash. Trunk: The patient's posture .   NEUROLOGIC EXAM: The patient is awake and alert, oriented to place and time.   Memory subjective described as intact.  Attention span & concentration ability appears limited, wife interjects a lot of additional information or corrects information.  Speech is fluent,  with dysphonia or aphasia.  Mood and affect are appropriate.   Cranial nerves: no loss of smell or taste reported  Pupils are equal and briskly reactive to light. Funduscopic exam deferred.  Extraocular movements in vertical and horizontal planes were intact and without nystagmus.  No Diplopia today . Visual fields by finger perimetry are intact. Hearing was intact to soft voice and finger rubbing.    Facial sensation intact to fine touch.  Facial motor strength is symmetric and tongue and uvula move midline.  Neck ROM : rotation, tilt and flexion extension were normal for age and shoulder shrug was symmetrical.    Motor exam:   right shoulder in a brace.  Normal tone without cog -wheeling, asymmetric  ( weak ) grip strength, weakest on the left. .   Sensory:  Fine touch, vibration were not felt in the left foot.  Proprioception tested in the upper extremities was normal.   Coordination: Rapid alternating movements in the fingers/hands were of normal speed.  The Finger-to-nose maneuver was intact without evidence of ataxia, dysmetria or tremor.   Gait and station: Patient could rise unassisted from a seated  position, walked without assistive device.  Stance is of wide base and the patient turned with 4-5 steps.  He wobbled. He has great trouble to climb stairs.  Toe and heel walk were deferred.   He cannot perform a tandem gait.  ATAXIA>    Deep tendon reflexes:  Patella only elicited on the right  knee.  Babinski response was deferred .   ASSESSMENT AND PLAN :   73 y.o. year old male  here with cognitive impairment, PET amyloid positive - very mild, no P tau , no filament. Long term heavy smoker and heavy drinker, who is now changing habits. .   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   We again discussed the improvement of lifestyle, no ETOH,  best moderate dietary habits, daily outside activity, sunlight day light exposure and bedtime before midnight.   We can retest by [facility]  in 6-8 months. Repeat ATN  at that time. :  stay on Namenda  .  List Aricept  as an allergy.  Diplopia has resolved.        I would like to thank Frann Mabel Mt, DO and Frann Mabel Mt, Do [address] [place],  [place] [address] for allowing me to meet with this pleasant patient.   Sleep Clinic Patients are generally offered input on sleep hygiene, life style changes and how to improve compliance with medical treatment where applicable. Review and reiteration of good sleep hygiene measures is offered to any sleep clinic patient, be it in the first consultation or with any follow up visits.    Any patient with sleepiness should be cautioned not to drive, work at heights, or operate dangerous or heavy equipment when feeling tired or sleepy.      The patient will be seen in follow-up in the sleep clinic at [facility] for discussion of test results, sleep related symptoms and treatment compliance review, further management strategies, etc.   The referring provider  will be notified of the test results.   The patient's condition requires frequent monitoring and adjustments in the treatment plan, reflecting the ongoing complexity of care.  This provider is the continuing focal point for all needed services for this  condition.  After spending a total time of  45  minutes face to face and time for  history taking, physical and neurologic examination, review of laboratory studies,  personal review of imaging studies, reports and results of other testing and review of referral information / records as far as provided in visit,   Electronically signed by: Dedra Gores, MD 05/22/2024 1:33 PM  [facility] Board certified by The [employer] of Sleep Medicine and Diplomate of the [employer] of Sleep Medicine. Board certified In Neurology through the ABPN, Fellow of the [employer] of Neu[employer].

## 2024-05-22 NOTE — Patient Instructions (Addendum)
  Start namenda  for 30 days before excelon is added.   ASSESSMENT AND PLAN :   73 y.o. year old male  here with cognitive impairment, PET amyloid positive - very mild, no P tau , no filament. Long term heavy smoker and heavy drinker, who is now changing habits. .   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   We again discussed the improvement of lifestyle, no ETOH,  best moderate dietary habits, daily outside activity, sunlight day light exposure and bedtime before midnight.   We can retest by [facility]  in 6-8 months. Repeat ATN  at that time. :  stay on Namenda  .  List Aricept  as an allergy.  Diplopia has resolved.

## 2024-05-22 NOTE — Telephone Encounter (Signed)
 Lvm for pt to cb to instruct on how to take pillow off of sling.

## 2024-05-23 MED ORDER — MEMANTINE HCL 5 MG PO TABS: 5.0000 mg | ORAL_TABLET | Freq: Two times a day (BID) | ORAL | 2 refills | Status: DC

## 2024-05-23 NOTE — Addendum Note (Signed)
 Addended by: CHALICE SAUNAS on: 05/23/2024 04:00 PM   Modules accepted: Orders

## 2024-05-27 ENCOUNTER — Other Ambulatory Visit: Payer: Self-pay | Admitting: Family Medicine

## 2024-05-27 DIAGNOSIS — G4700 Insomnia, unspecified: Secondary | ICD-10-CM

## 2024-05-28 ENCOUNTER — Other Ambulatory Visit: Payer: Self-pay

## 2024-05-28 MED ORDER — PIOGLITAZONE HCL 45 MG PO TABS: 45.0000 mg | ORAL_TABLET | Freq: Every day | ORAL | 3 refills | Status: DC

## 2024-05-31 ENCOUNTER — Ambulatory Visit (INDEPENDENT_AMBULATORY_CARE_PROVIDER_SITE_OTHER): Admitting: Orthopaedic Surgery

## 2024-05-31 ENCOUNTER — Ambulatory Visit (INDEPENDENT_AMBULATORY_CARE_PROVIDER_SITE_OTHER)

## 2024-05-31 DIAGNOSIS — M19011 Primary osteoarthritis, right shoulder: Secondary | ICD-10-CM

## 2024-05-31 NOTE — Progress Notes (Signed)
 Post Operative Evaluation    Procedure/Date of Surgery: Right shoulder reverse shoulder arthroplasty 11/20  Interval History:    Presents 2 weeks status post right shoulder reverse shoulder arthroplasty overall doing extremely well.  He does have his first physical therapy session set up.  He has been compliant with sling usage   PMH/PSH/Family History/Social History/Meds/Allergies:    Past Medical History:  Diagnosis Date   Adenomatous colon polyp    tubular   CAD (coronary artery disease)    a. Cath 06/08/01 at [facility] with normal LM and LAD, 95% LCx s/p Circumflex stent 4.0 x 15 mm Penta and 85% and 80% prox RCA s/p 3.5 x 23 mm Penta b. cath 01/20/2015 95% prox LCx ISR treated with DES, 55% prox to mid RCA, 45% distal RCA, 40% midLAD, EF 55%     Cataract    Diabetes mellitus without complication (HCC)    Diarrhea    Diverticulosis    Erectile dysfunction    Gallstones    Hyperlipidemia    Hypertension    Myocardial infarction [facility])    Past Surgical History:  Procedure Laterality Date   ANGIOPLASTY  2002   2 stents   BALLOON DILATION N/A 08/27/2023   Procedure: BALLOON DILATION;  Surgeon: Abran Norleen SAILOR, MD;  Location: [facility];  Service: Gastroenterology;  Laterality: N/A;   CARDIAC CATHETERIZATION N/A 01/20/2015   Procedure: Left Heart Cath and Coronary Angiography;  Surgeon: Victory LELON Sharps, MD;  Location: [facility] INVASIVE CV LAB;  Service: Cardiovascular;  Laterality: N/A;   CARDIAC CATHETERIZATION N/A 01/27/2016   Procedure: Left Heart Cath and Coronary Angiography;  Surgeon: Ozell Fell, MD;  Location: [facility] INVASIVE CV LAB;  Service: Cardiovascular;  Laterality: N/A;   CHOLECYSTECTOMY N/A 02/09/2016   Procedure: LAPAROSCOPIC CHOLECYSTECTOMY WITH INTRAOPERATIVE CHOLANGIOGRAM;  Surgeon: Debby Shipper, MD;  Location: [facility] OR;  Service: General;  Laterality: N/A;   COLONOSCOPY W/ POLYPECTOMY  08/2013   Avg risk screening, Dr Arlester Aho. 5 mm  tubular adenoma ascending.  Mild to moderated descending and sigmoid tics.  Internal hemorrhoids   CORONARY ANGIOPLASTY WITH STENT PLACEMENT     ERCP N/A 02/13/2016   Procedure: ENDOSCOPIC RETROGRADE CHOLANGIOPANCREATOGRAPHY (ERCP);  Surgeon: Victory LITTIE Legrand DOUGLAS, MD;  Location: [facility] ENDOSCOPY;  Service: Gastroenterology;  Laterality: N/A;   ERCP N/A 08/27/2023   Procedure: ENDOSCOPIC RETROGRADE CHOLANGIOPANCREATOGRAPHY (ERCP);  Surgeon: Abran Norleen SAILOR, MD;  Location: [facility];  Service: Gastroenterology;  Laterality: N/A;   REMOVAL OF STONES  08/27/2023   Procedure: REMOVAL OF STONES;  Surgeon: Abran Norleen SAILOR, MD;  Location: [facility];  Service: Gastroenterology;;   ROTATOR CUFF REPAIR Right 2009   ROTATOR CUFF REPAIR Left 2014   SPHINCTEROTOMY  08/27/2023   Procedure: SPHINCTEROTOMY;  Surgeon: Abran Norleen SAILOR, MD;  Location: [facility];  Service: Gastroenterology;;   TRIGGER FINGER RELEASE Bilateral 2013   4 fingers, middle finger on both hands   Social History   Socioeconomic History   Marital status: Significant Other    Spouse name: Not on file   Number of children: 2   Years of education: 23   Highest education level: Bachelor's degree (e.g., BA, AB, BS)  Occupational History   Occupation: Airline Pilot- retired in 2018    Employer: [employer]  Tobacco Use   Smoking status: Former  Current packs/day: 0.00    Average packs/day: 1 pack/day for 45.0 years (45.0 ttl pk-yrs)    Types: Cigarettes    Start date: 01/16/1970    Quit date: 01/17/2015    Years since quitting: 9.3   Smokeless tobacco: Never  Vaping Use   Vaping status: Never Used  Substance and Sexual Activity   Alcohol use: Not Currently    Alcohol/week: 1.0 standard drink of alcohol    Types: 1 Glasses of wine per week    Comment: Previously 1 large vodka tonic per day Quit 06/2023   Drug use: No   Sexual activity: Yes  Other Topics Concern   Not on file  Social History Narrative   Fun: Boat, golf   Denies  religious beliefs effecting health care.       1 cup of coffee in the morning.    Social Drivers of Health   Financial Resource Strain: Medium Risk (04/15/2024)   Overall Financial Resource Strain (CARDIA)    Difficulty of Paying Living Expenses: Somewhat hard  Food Insecurity: No Food Insecurity (04/15/2024)   Hunger Vital Sign    Worried About Running Out of Food in the Last Year: Never true    Ran Out of Food in the Last Year: Never true  Transportation Needs: No Transportation Needs (04/15/2024)   PRAPARE - Administrator, Civil Service (Medical): No    Lack of Transportation (Non-Medical): No  Physical Activity: Inactive (04/18/2024)   Exercise Vital Sign    Days of Exercise per Week: Not on file    Minutes of Exercise per Session: 0 min  Stress: No Stress Concern Present (04/15/2024)   [employer] of Occupational Health - Occupational Stress Questionnaire    Feeling of Stress: Only a little  Recent Concern: Stress - Stress Concern Present (02/18/2024)   [employer] of Occupational Health - Occupational Stress Questionnaire    Feeling of Stress: To some extent  Social Connections: Moderately Isolated (04/15/2024)   Social Connection and Isolation Panel    Frequency of Communication with Friends and Family: More than three times a week    Frequency of Social Gatherings with Friends and Family: More than three times a week    Attends Religious Services: Never    Database Administrator or Organizations: No    Attends Engineer, Structural: Not on file    Marital Status: Living with partner   Family History  Problem Relation Age of Onset   Stroke Mother    Dementia Mother    Crohn's disease Mother    Heart disease Father 97       Valve replacement and CAD, CABG   Diabetes Father    CAD Sister 48   Sleep apnea Brother    CAD Brother 64       Died age 74   Diabetes Brother    Kidney disease Brother    Colon cancer Neg Hx    Esophageal  cancer Neg Hx    Stomach cancer Neg Hx    Rectal cancer Neg Hx    Pancreatic cancer Neg Hx    Seizures Neg Hx    Migraines Neg Hx    Allergies  Allergen Reactions   Other Rash    ECG electrodes, per patient   Protonix  [Pantoprazole ] Other (See Comments)    Exacerbated reflux s/s's   Current Outpatient Medications  Medication Sig Dispense Refill   Accu-Chek FastClix Lancets MISC Check blood sugars daily 102 each  12   amLODipine  (NORVASC ) 5 MG tablet TAKE 1 TABLET BY MOUTH DAILY 90 tablet 3   aspirin  EC 81 MG tablet Take 1 tablet (81 mg total) by mouth daily. Swallow whole.     atorvastatin  (LIPITOR ) 80 MG tablet TAKE 1 TABLET BY MOUTH AT  BEDTIME 90 tablet 3   celecoxib  (CELEBREX ) 200 MG capsule Take 1 capsule by mouth twice daily 30 capsule 0   ezetimibe  (ZETIA ) 10 MG tablet Take 1 tablet (10 mg total) by mouth daily. 90 tablet 2   fenofibrate  (TRICOR ) 48 MG tablet TAKE 1 TABLET BY MOUTH DAILY 90 tablet 3   glucose blood (ACCU-CHEK GUIDE) test strip Use daily to check blood sugar.  DX E11.65 100 each 3   Krill Oil 500 MG CAPS Take 500 mg by mouth 2 (two) times daily.     memantine  (NAMENDA ) 5 MG tablet Take 1 tablet (5 mg total) by mouth 2 (two) times daily. 60 tablet 2   metFORMIN  (GLUCOPHAGE -XR) 500 MG 24 hr tablet TAKE 2 TABLETS BY MOUTH TWICE  DAILY WITH FOOD (Patient taking differently: Takes 2 tablets every morning.) 360 tablet 3   metoprolol  tartrate (LOPRESSOR ) 25 MG tablet TAKE 1 TABLET BY MOUTH TWICE  DAILY 180 tablet 3   Multiple Vitamin (MULTIVITAMIN WITH MINERALS) TABS tablet Take 1 tablet by mouth daily.     nitroGLYCERIN  (NITROSTAT ) 0.4 MG SL tablet Place 1 tablet (0.4 mg total) under the tongue every 5 (five) minutes as needed for chest pain. 25 tablet 3   pioglitazone  (ACTOS ) 45 MG tablet Take 1 tablet (45 mg total) by mouth daily. 90 tablet 3   prazosin  (MINIPRESS ) 2 MG capsule Take 1 capsule (2 mg total) by mouth at bedtime. 30 capsule 1   ramipril  (ALTACE ) 10 MG  capsule TAKE 1 CAPSULE BY MOUTH TWICE  DAILY 180 capsule 3   rivastigmine  (EXELON ) 1.5 MG capsule Take 1 capsule (1.5 mg total) by mouth 2 (two) times daily. 60 capsule 3   tadalafil  (CIALIS ) 5 MG tablet Take 1 tablet (5 mg total) by mouth daily. 30 tablet 11   Testosterone  (ANDROGEL ) 20.25 MG/1.25GM (1.62%) GEL Apply 4 Pump topically daily. 150 g 5   Vibegron  (GEMTESA ) 75 MG TABS Take 1 tablet (75 mg total) by mouth daily. 30 tablet 11   No current facility-administered medications for this visit.   No results found.  Review of Systems:   A ROS was performed including pertinent positives and negatives as documented in the HPI.   Musculoskeletal Exam:    There were no vitals taken for this visit.  Shoulder incisions well-appearing without erythema or drainage.  In the sitting position he can actively forward elevate to 45 degrees external rotation at side to 20 degrees internal rotation deferred today  Imaging:    3 views right shoulder: Status post right shoulder reverse shoulder arthroplasty without complication  I personally reviewed and interpreted the radiographs.   Assessment:   2 weeks status post right shoulder arthroplasty without evidence of complication.  At this time we will continue to work to the reverse shoulder arthroplasty protocol and I will plan to see him back in 4 weeks for reassessment  Plan :    - Return to clinic 4 weeks for reassessment      I personally saw and evaluated the patient, and participated in the management and treatment plan.  Elspeth Parker, MD Attending Physician, Orthopedic Surgery  This document was dictated using Dragon voice recognition software. A  reasonable attempt at proof reading has been made to minimize errors.

## 2024-06-05 ENCOUNTER — Encounter (HOSPITAL_BASED_OUTPATIENT_CLINIC_OR_DEPARTMENT_OTHER): Payer: Self-pay | Admitting: Orthopaedic Surgery

## 2024-06-05 ENCOUNTER — Other Ambulatory Visit: Payer: Self-pay | Admitting: Urology

## 2024-06-06 ENCOUNTER — Other Ambulatory Visit (HOSPITAL_BASED_OUTPATIENT_CLINIC_OR_DEPARTMENT_OTHER): Payer: Self-pay | Admitting: Orthopaedic Surgery

## 2024-06-06 ENCOUNTER — Encounter (HOSPITAL_BASED_OUTPATIENT_CLINIC_OR_DEPARTMENT_OTHER): Payer: Self-pay | Admitting: Orthopaedic Surgery

## 2024-06-08 ENCOUNTER — Other Ambulatory Visit: Payer: Self-pay | Admitting: Family Medicine

## 2024-06-08 DIAGNOSIS — G4700 Insomnia, unspecified: Secondary | ICD-10-CM

## 2024-06-18 ENCOUNTER — Other Ambulatory Visit: Payer: Self-pay | Admitting: Family Medicine

## 2024-06-18 DIAGNOSIS — G4700 Insomnia, unspecified: Secondary | ICD-10-CM

## 2024-06-19 ENCOUNTER — Other Ambulatory Visit: Payer: Self-pay

## 2024-06-19 MED ORDER — PIOGLITAZONE HCL 45 MG PO TABS: 45.0000 mg | ORAL_TABLET | Freq: Every day | ORAL | 3 refills | Status: AC

## 2024-06-20 ENCOUNTER — Encounter: Payer: Self-pay | Admitting: Psychology

## 2024-06-20 ENCOUNTER — Encounter: Attending: Psychology | Admitting: Psychology

## 2024-06-20 ENCOUNTER — Ambulatory Visit: Admitting: Psychology

## 2024-06-20 DIAGNOSIS — G3184 Mild cognitive impairment, so stated: Secondary | ICD-10-CM | POA: Insufficient documentation

## 2024-06-20 DIAGNOSIS — F5101 Primary insomnia: Secondary | ICD-10-CM | POA: Insufficient documentation

## 2024-06-20 DIAGNOSIS — I214 Non-ST elevation (NSTEMI) myocardial infarction: Secondary | ICD-10-CM | POA: Diagnosis present

## 2024-06-20 DIAGNOSIS — R2681 Unsteadiness on feet: Secondary | ICD-10-CM | POA: Insufficient documentation
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
## 2024-06-27 ENCOUNTER — Other Ambulatory Visit

## 2024-06-27 DIAGNOSIS — E291 Testicular hypofunction: Secondary | ICD-10-CM

## 2024-06-27 DIAGNOSIS — N138 Other obstructive and reflux uropathy: Secondary | ICD-10-CM

## 2024-06-27 LAB — CBC
Hematocrit: 38.9 % (ref 37.5–51.0)
Hemoglobin: 12.4 g/dL — ABNORMAL LOW (ref 13.0–17.7)
MCH: 29.1 pg (ref 26.6–33.0)
MCHC: 31.9 g/dL (ref 31.5–35.7)
MCV: 91 fL (ref 79–97)
Platelets: 289 x10E3/uL (ref 150–450)
RBC: 4.26 x10E6/uL (ref 4.14–5.80)
RDW: 13.1 % (ref 11.6–15.4)
WBC: 6.2 x10E3/uL (ref 3.4–10.8)

## 2024-06-27 LAB — TESTOSTERONE: Testosterone: 565 ng/dL (ref 264–916)

## 2024-06-27 LAB — PSA: Prostate Specific Ag, Serum: 2.7 ng/mL (ref 0.0–4.0)

## 2024-06-29 ENCOUNTER — Ambulatory Visit: Payer: Self-pay | Admitting: Urology

## 2024-07-01 ENCOUNTER — Encounter: Payer: Self-pay | Admitting: Family Medicine

## 2024-07-02 ENCOUNTER — Ambulatory Visit: Admitting: Psychology

## 2024-07-02 DIAGNOSIS — G4700 Insomnia, unspecified: Secondary | ICD-10-CM

## 2024-07-02 MED ORDER — PRAZOSIN HCL 2 MG PO CAPS: 2.0000 mg | ORAL_CAPSULE | Freq: Every day | ORAL | 0 refills | Status: DC

## 2024-07-05 ENCOUNTER — Encounter: Payer: Self-pay | Admitting: Urology

## 2024-07-05 ENCOUNTER — Ambulatory Visit: Admitting: Urology

## 2024-07-05 VITALS — BP 139/69 | HR 69 | Ht 64.0 in | Wt 190.0 lb

## 2024-07-05 DIAGNOSIS — E291 Testicular hypofunction: Secondary | ICD-10-CM

## 2024-07-05 DIAGNOSIS — N529 Male erectile dysfunction, unspecified: Secondary | ICD-10-CM | POA: Diagnosis not present

## 2024-07-05 DIAGNOSIS — N138 Other obstructive and reflux uropathy: Secondary | ICD-10-CM

## 2024-07-05 DIAGNOSIS — N401 Enlarged prostate with lower urinary tract symptoms: Secondary | ICD-10-CM | POA: Diagnosis not present

## 2024-07-05 LAB — URINALYSIS, ROUTINE W REFLEX MICROSCOPIC
Bilirubin, UA: NEGATIVE
Glucose, UA: NEGATIVE
Ketones, UA: NEGATIVE
Leukocytes,UA: NEGATIVE
Nitrite, UA: NEGATIVE
Protein,UA: NEGATIVE
RBC, UA: NEGATIVE
Specific Gravity, UA: 1.025 (ref 1.005–1.030)
Urobilinogen, Ur: 0.2 mg/dL (ref 0.2–1.0)
pH, UA: 5.5 (ref 5.0–7.5)

## 2024-07-05 NOTE — Progress Notes (Signed)
 "  Assessment: 1. BPH with obstruction/lower urinary tract symptoms   2. Organic impotence   3. Hypogonadism in male    Plan: Continue tadalafil  5 mg daily. Continue Gemtesa  75 mg daily. Continue Androgel  1.62% 4 pumps/day  Labs in 6 months: Testosterone , CBC Return to office in 6 months  Chief Complaint:  Chief Complaint  Patient presents with   Benign Prostatic Hypertrophy    History of Present Illness:  Troy Boyer is a 74 y.o. male who is seen for continued evaluation of BPH with lower urinary tract symptoms, hypogonadism, and erectile dysfunction. He had previously been followed for these urologic issues at [facility] by Dr. Devere.  His last visit there was in February 2025.  BPH with LUTS: He has recently been managed with tadalafil  5 mg daily and Myrbetriq  50 mg daily.  He reported his urinary symptoms were stable and his visit on 08/18/2023. PSA from 2/25: 3.39 He reported that the Myrbetriq  was not controlling his urgency and urge incontinence.  He was having frequency voiding every 2 hours and nocturia x 3 and requiring the use of adult briefs for his incontinence.  No dysuria or gross hematuria. IPSS = 17/5. PVR = 143 ml. He was given a trial of Gemtesa  in place of Myrbetriq  in April 2025.  At his visit in May 2025, he reported some improvement in his symptoms with the Gemtesa .  He felt like this worked better than the Myrbetriq .  However, he continued to have urgency and urge incontinence requiring use of adult briefs.  No side effects from the medication.  No dysuria or gross hematuria. IPSS = 16/4. PVR = 86 mL  At his visit in July 2025, he continued on daily tadalafil  and Gemtesa .  He reported symptoms of urgency, frequency, and nocturia x 3.  No dysuria or gross hematuria.   IPSS = 13/5.  He continues on daily tadalafil  and Gemtesa .  His urinary symptoms are relatively stable.  He continues to have symptoms of frequency, urgency, and nocturia x 3.  No  dysuria or gross hematuria. IPSS = 10/5.  ED: He has been managed with tadalafil  5 mg daily with good results. He continues to have satisfactory results with the daily tadalafil .  Hypogonadism: He was previously on Clomid but this was discontinued due to cost of the medication and lack of insurance coverage for the medication.  He noted a return of his hypogonadal symptoms after stopping the Clomid.   Testosterone  2/25: 283.5 He had resumed the Clomid and noted symptomatic benefit.  He was interested in alternative therapies for management of his hypogonadism. He was started on topical testosterone  1.62% 4 pumps/day in May 2025.  Labs from 6/25: Testosterone  498 H&H  13.2/42.1  He continued on testosterone  gel 1.60% 4 pumps/day with significant symptomatic improvement with this medication.  No side effects.  He was very pleased with results of this therapy.  Labs from 12/25: Testosterone  565 H&H  12.4/38.9 PSA  2.7  He continues to have excellent symptomatic improvement with the testosterone  replacement.  No side effects.  Portions of the above documentation were copied from a prior visit for review purposes only.   Past Medical History:  Past Medical History:  Diagnosis Date   Adenomatous colon polyp    tubular   CAD (coronary artery disease)    a. Cath 06/08/01 at [facility] with normal LM and LAD, 95% LCx s/p Circumflex stent 4.0 x 15 mm Penta and 85% and 80% prox RCA s/p  3.5 x 23 mm Penta b. cath 01/20/2015 95% prox LCx ISR treated with DES, 55% prox to mid RCA, 45% distal RCA, 40% midLAD, EF 55%     Cataract    Diabetes mellitus without complication (HCC)    Diarrhea    Diverticulosis    Erectile dysfunction    Gallstones    Hyperlipidemia    Hypertension    Myocardial infarction [facility])     Past Surgical History:  Past Surgical History:  Procedure Laterality Date   ANGIOPLASTY  2002   2 stents   BALLOON DILATION N/A 08/27/2023   Procedure: BALLOON DILATION;   Surgeon: Abran Norleen SAILOR, MD;  Location: [facility];  Service: Gastroenterology;  Laterality: N/A;   CARDIAC CATHETERIZATION N/A 01/20/2015   Procedure: Left Heart Cath and Coronary Angiography;  Surgeon: Victory LELON Sharps, MD;  Location: [facility] INVASIVE CV LAB;  Service: Cardiovascular;  Laterality: N/A;   CARDIAC CATHETERIZATION N/A 01/27/2016   Procedure: Left Heart Cath and Coronary Angiography;  Surgeon: Ozell Fell, MD;  Location: [facility] INVASIVE CV LAB;  Service: Cardiovascular;  Laterality: N/A;   CHOLECYSTECTOMY N/A 02/09/2016   Procedure: LAPAROSCOPIC CHOLECYSTECTOMY WITH INTRAOPERATIVE CHOLANGIOGRAM;  Surgeon: Debby Shipper, MD;  Location: [facility] OR;  Service: General;  Laterality: N/A;   COLONOSCOPY W/ POLYPECTOMY  08/2013   Avg risk screening, Dr Arlester Aho. 5 mm tubular adenoma ascending.  Mild to moderated descending and sigmoid tics.  Internal hemorrhoids   CORONARY ANGIOPLASTY WITH STENT PLACEMENT     ERCP N/A 02/13/2016   Procedure: ENDOSCOPIC RETROGRADE CHOLANGIOPANCREATOGRAPHY (ERCP);  Surgeon: Victory LITTIE Legrand DOUGLAS, MD;  Location: [facility] ENDOSCOPY;  Service: Gastroenterology;  Laterality: N/A;   ERCP N/A 08/27/2023   Procedure: ENDOSCOPIC RETROGRADE CHOLANGIOPANCREATOGRAPHY (ERCP);  Surgeon: Abran Norleen SAILOR, MD;  Location: [facility];  Service: Gastroenterology;  Laterality: N/A;   REMOVAL OF STONES  08/27/2023   Procedure: REMOVAL OF STONES;  Surgeon: Abran Norleen SAILOR, MD;  Location: [facility];  Service: Gastroenterology;;   ROTATOR CUFF REPAIR Right 2009   ROTATOR CUFF REPAIR Left 2014   SPHINCTEROTOMY  08/27/2023   Procedure: SPHINCTEROTOMY;  Surgeon: Abran Norleen SAILOR, MD;  Location: [facility]COPY;  Service: Gastroenterology;;   TRIGGER FINGER RELEASE Bilateral 2013   4 fingers, middle finger on both hands    Allergies:  Allergies  Allergen Reactions   Other Rash    ECG electrodes, per patient   Protonix  [Pantoprazole ] Other (See Comments)    Exacerbated reflux s/s's    Family History:   Family History  Problem Relation Age of Onset   Stroke Mother    Dementia Mother    Crohn's disease Mother    Heart disease Father 74       Valve replacement and CAD, CABG   Diabetes Father    CAD Sister 73   Sleep apnea Brother    CAD Brother 31       Died age 30   Diabetes Brother    Kidney disease Brother    Colon cancer Neg Hx    Esophageal cancer Neg Hx    Stomach cancer Neg Hx    Rectal cancer Neg Hx    Pancreatic cancer Neg Hx    Seizures Neg Hx    Migraines Neg Hx     Social History:  Social History   Tobacco Use   Smoking status: Former    Current packs/day: 0.00    Average packs/day: 1 pack/day for 45.0 years (45.0 ttl pk-yrs)    Types:  Cigarettes    Start date: 01/16/1970    Quit date: 01/17/2015    Years since quitting: 9.4   Smokeless tobacco: Never  Vaping Use   Vaping status: Never Used  Substance Use Topics   Alcohol use: Not Currently    Alcohol/week: 1.0 standard drink of alcohol    Types: 1 Glasses of wine per week    Comment: Previously 1 large vodka tonic per day Quit 06/2023   Drug use: No    ROS: Constitutional:  Negative for fever, chills, weight loss CV: Negative for chest pain, previous MI, hypertension Respiratory:  Negative for shortness of breath, wheezing, sleep apnea, frequent cough GI:  Negative for nausea, vomiting, bloody stool, GERD  Physical exam: BP 139/69   Pulse 69   Ht 5' 4 (1.626 m)   Wt 190 lb (86.2 kg)   BMI 32.61 kg/m  GENERAL APPEARANCE:  Well appearing, well developed, well nourished, NAD HEENT:  Atraumatic, normocephalic, oropharynx clear NECK:  Supple without lymphadenopathy or thyromegaly ABDOMEN:  Soft, non-tender, no masses EXTREMITIES:  Moves all extremities well, without clubbing, cyanosis, or edema NEUROLOGIC:  Alert and oriented x 3, normal gait, CN II-XII grossly intact MENTAL STATUS:  appropriate BACK:  Non-tender to palpation, No CVAT SKIN:  Warm, dry, and intact  Results: U/A: Negative "

## 2024-07-10 ENCOUNTER — Encounter

## 2024-07-11 ENCOUNTER — Telehealth (HOSPITAL_BASED_OUTPATIENT_CLINIC_OR_DEPARTMENT_OTHER): Payer: Self-pay | Admitting: Orthopaedic Surgery

## 2024-07-11 ENCOUNTER — Ambulatory Visit (INDEPENDENT_AMBULATORY_CARE_PROVIDER_SITE_OTHER): Admitting: Orthopaedic Surgery

## 2024-07-11 ENCOUNTER — Ambulatory Visit (HOSPITAL_BASED_OUTPATIENT_CLINIC_OR_DEPARTMENT_OTHER)

## 2024-07-11 DIAGNOSIS — M19011 Primary osteoarthritis, right shoulder: Secondary | ICD-10-CM

## 2024-07-11 NOTE — Progress Notes (Signed)
 "                                Post Operative Evaluation    Procedure/Date of Surgery: Right shoulder reverse shoulder arthroplasty 11/20  Interval History:    Presents 6 weeks status post right shoulder reverse shoulder arthroplasty today with more significant shoulder girdle tenderness as he has begun active range of motion   PMH/PSH/Family History/Social History/Meds/Allergies:    Past Medical History:  Diagnosis Date   Adenomatous colon polyp    tubular   CAD (coronary artery disease)    a. Cath 06/08/01 at [facility] with normal LM and LAD, 95% LCx s/p Circumflex stent 4.0 x 15 mm Penta and 85% and 80% prox RCA s/p 3.5 x 23 mm Penta b. cath 01/20/2015 95% prox LCx ISR treated with DES, 55% prox to mid RCA, 45% distal RCA, 40% midLAD, EF 55%     Cataract    Diabetes mellitus without complication (HCC)    Diarrhea    Diverticulosis    Erectile dysfunction    Gallstones    Hyperlipidemia    Hypertension    Myocardial infarction [facility])    Past Surgical History:  Procedure Laterality Date   ANGIOPLASTY  2002   2 stents   BALLOON DILATION N/A 08/27/2023   Procedure: BALLOON DILATION;  Surgeon: Abran Norleen SAILOR, MD;  Location: [facility];  Service: Gastroenterology;  Laterality: N/A;   CARDIAC CATHETERIZATION N/A 01/20/2015   Procedure: Left Heart Cath and Coronary Angiography;  Surgeon: Victory LELON Sharps, MD;  Location: [facility] INVASIVE CV LAB;  Service: Cardiovascular;  Laterality: N/A;   CARDIAC CATHETERIZATION N/A 01/27/2016   Procedure: Left Heart Cath and Coronary Angiography;  Surgeon: Ozell Fell, MD;  Location: [facility] INVASIVE CV LAB;  Service: Cardiovascular;  Laterality: N/A;   CHOLECYSTECTOMY N/A 02/09/2016   Procedure: LAPAROSCOPIC CHOLECYSTECTOMY WITH INTRAOPERATIVE CHOLANGIOGRAM;  Surgeon: Debby Shipper, MD;  Location: [facility] OR;  Service: General;  Laterality: N/A;   COLONOSCOPY W/ POLYPECTOMY  08/2013   Avg risk screening, Dr Arlester Aho. 5 mm tubular adenoma ascending.  Mild  to moderated descending and sigmoid tics.  Internal hemorrhoids   CORONARY ANGIOPLASTY WITH STENT PLACEMENT     ERCP N/A 02/13/2016   Procedure: ENDOSCOPIC RETROGRADE CHOLANGIOPANCREATOGRAPHY (ERCP);  Surgeon: Victory LITTIE Legrand DOUGLAS, MD;  Location: [facility] ENDOSCOPY;  Service: Gastroenterology;  Laterality: N/A;   ERCP N/A 08/27/2023   Procedure: ENDOSCOPIC RETROGRADE CHOLANGIOPANCREATOGRAPHY (ERCP);  Surgeon: Abran Norleen SAILOR, MD;  Location: [facility];  Service: Gastroenterology;  Laterality: N/A;   REMOVAL OF STONES  08/27/2023   Procedure: REMOVAL OF STONES;  Surgeon: Abran Norleen SAILOR, MD;  Location: [facility];  Service: Gastroenterology;;   ROTATOR CUFF REPAIR Right 2009   ROTATOR CUFF REPAIR Left 2014   SPHINCTEROTOMY  08/27/2023   Procedure: SPHINCTEROTOMY;  Surgeon: Abran Norleen SAILOR, MD;  Location: [facility];  Service: Gastroenterology;;   TRIGGER FINGER RELEASE Bilateral 2013   4 fingers, middle finger on both hands   Social History   Socioeconomic History   Marital status: Significant Other    Spouse name: Not on file   Number of children: 2   Years of education: 76   Highest education level: Bachelor's degree (e.g., BA, AB, BS)  Occupational History   Occupation: Airline Pilot- retired in 2018    Employer: [employer]  Tobacco Use   Smoking status: Former    Current packs/day: 0.00  Average packs/day: 1 pack/day for 45.0 years (45.0 ttl pk-yrs)    Types: Cigarettes    Start date: 01/16/1970    Quit date: 01/17/2015    Years since quitting: 9.4   Smokeless tobacco: Never  Vaping Use   Vaping status: Never Used  Substance and Sexual Activity   Alcohol use: Not Currently    Alcohol/week: 1.0 standard drink of alcohol    Types: 1 Glasses of wine per week    Comment: Previously 1 large vodka tonic per day Quit 06/2023   Drug use: No   Sexual activity: Yes  Other Topics Concern   Not on file  Social History Narrative   Fun: Boat, golf   Denies religious beliefs effecting health care.        1 cup of coffee in the morning.    Social Drivers of Health   Tobacco Use: Medium Risk (07/05/2024)   Patient History    Smoking Tobacco Use: Former    Smokeless Tobacco Use: Never    Passive Exposure: Not on file  Financial Resource Strain: Medium Risk (04/15/2024)   Overall Financial Resource Strain (CARDIA)    Difficulty of Paying Living Expenses: Somewhat hard  Food Insecurity: No Food Insecurity (04/15/2024)   Epic    Worried About Programme Researcher, Broadcasting/film/video in the Last Year: Never true    Ran Out of Food in the Last Year: Never true  Transportation Needs: No Transportation Needs (04/15/2024)   Epic    Lack of Transportation (Medical): No    Lack of Transportation (Non-Medical): No  Physical Activity: Inactive (04/18/2024)   Exercise Vital Sign    Days of Exercise per Week: Not on file    Minutes of Exercise per Session: 0 min  Stress: No Stress Concern Present (04/15/2024)   [employer] of Occupational [employer] - Occupational Stress Questionnaire    Feeling of Stress: Only a little  Recent Concern: Stress - Stress Concern Present (02/18/2024)   [employer] of Occupational Health - Occupational Stress Questionnaire    Feeling of Stress: To some extent  Social Connections: Moderately Isolated (04/15/2024)   Social Connection and Isolation Panel    Frequency of Communication with Friends and Family: More than three times a week    Frequency of Social Gatherings with Friends and Family: More than three times a week    Attends Religious Services: Never    Database Administrator or Organizations: No    Attends Engineer, Structural: Not on file    Marital Status: Living with partner  Depression (PHQ2-9): Low Risk (04/24/2024)   Depression (PHQ2-9)    PHQ-2 Score: 0  Recent Concern: Depression (PHQ2-9) - Medium Risk (02/24/2024)   Depression (PHQ2-9)    PHQ-2 Score: 6  Alcohol Screen: Low Risk (04/18/2024)   Alcohol Screen    Last Alcohol Screening Score  (AUDIT): 0  Housing: Low Risk (04/18/2024)   Epic    Unable to Pay for Housing in the Last Year: No    Number of Times Moved in the Last Year: 0    Homeless in the Last Year: No  Utilities: Not At Risk (04/18/2024)   Epic    Threatened with loss of utilities: No  Health Literacy: Adequate Health Literacy (04/18/2024)   B1300 Health Literacy    Frequency of need for help with medical instructions: Never   Family History  Problem Relation Age of Onset   Stroke Mother    Dementia Mother    Crohn's  disease Mother    Heart disease Father 12       Valve replacement and CAD, CABG   Diabetes Father    CAD Sister 48   Sleep apnea Brother    CAD Brother 27       Died age 39   Diabetes Brother    Kidney disease Brother    Colon cancer Neg Hx    Esophageal cancer Neg Hx    Stomach cancer Neg Hx    Rectal cancer Neg Hx    Pancreatic cancer Neg Hx    Seizures Neg Hx    Migraines Neg Hx    Allergies  Allergen Reactions   Other Rash    ECG electrodes, per patient   Protonix  [Pantoprazole ] Other (See Comments)    Exacerbated reflux s/s's   Current Outpatient Medications  Medication Sig Dispense Refill   Accu-Chek FastClix Lancets MISC Check blood sugars daily 102 each 12   amLODipine  (NORVASC ) 5 MG tablet TAKE 1 TABLET BY MOUTH DAILY 90 tablet 3   aspirin  EC 81 MG tablet Take 1 tablet (81 mg total) by mouth daily. Swallow whole.     atorvastatin  (LIPITOR ) 80 MG tablet TAKE 1 TABLET BY MOUTH AT  BEDTIME 90 tablet 3   ezetimibe  (ZETIA ) 10 MG tablet Take 1 tablet (10 mg total) by mouth daily. 90 tablet 2   fenofibrate  (TRICOR ) 48 MG tablet TAKE 1 TABLET BY MOUTH DAILY 90 tablet 3   glucose blood (ACCU-CHEK GUIDE) test strip Use daily to check blood sugar.  DX E11.65 100 each 3   Krill Oil 500 MG CAPS Take 500 mg by mouth 2 (two) times daily.     metFORMIN  (GLUCOPHAGE -XR) 500 MG 24 hr tablet TAKE 2 TABLETS BY MOUTH TWICE  DAILY WITH FOOD (Patient taking differently: Takes 2 tablets  every morning.) 360 tablet 3   metoprolol  tartrate (LOPRESSOR ) 25 MG tablet TAKE 1 TABLET BY MOUTH TWICE  DAILY 180 tablet 3   Multiple Vitamin (MULTIVITAMIN WITH MINERALS) TABS tablet Take 1 tablet by mouth daily.     nitroGLYCERIN  (NITROSTAT ) 0.4 MG SL tablet Place 1 tablet (0.4 mg total) under the tongue every 5 (five) minutes as needed for chest pain. 25 tablet 3   pioglitazone  (ACTOS ) 45 MG tablet Take 1 tablet (45 mg total) by mouth daily. 90 tablet 3   prazosin  (MINIPRESS ) 2 MG capsule Take 1 capsule (2 mg total) by mouth at bedtime. 30 capsule 0   ramipril  (ALTACE ) 10 MG capsule TAKE 1 CAPSULE BY MOUTH TWICE  DAILY 180 capsule 3   rivastigmine  (EXELON ) 1.5 MG capsule Take 1 capsule (1.5 mg total) by mouth 2 (two) times daily. 60 capsule 3   tadalafil  (CIALIS ) 5 MG tablet Take 1 tablet (5 mg total) by mouth daily. 30 tablet 11   Testosterone  1.62 % GEL APPLY 4 PUMPS TOPICALLY DAILY 150 g 5   Vibegron  (GEMTESA ) 75 MG TABS Take 1 tablet (75 mg total) by mouth daily. 30 tablet 11   No current facility-administered medications for this visit.   No results found.  Review of Systems:   A ROS was performed including pertinent positives and negatives as documented in the HPI.   Musculoskeletal Exam:    There were no vitals taken for this visit.  Shoulder incisions well-appearing without erythema or drainage.  In the sitting position he can actively forward elevate to 45 degrees external rotation at side to 20 degrees internal rotation deferred today  Imaging:  3 views right shoulder: Status post right shoulder reverse shoulder arthroplasty without complication  I personally reviewed and interpreted the radiographs.   Assessment:   7 weeks status post right shoulder arthroplasty without evidence of complication.  At this time he has been experiencing some shoulder girdle pain and weakness as he is really ramped up active range of motion.  At this time I did discuss that I am  concerned for possible early stress reaction and given that we will plan to discontinue therapy and pursue sling usage for 2 weeks.  I will see him back after that for reassessment Plan :    - Return to clinic 2 weeks      I personally saw and evaluated the patient, and participated in the management and treatment plan.  Elspeth Parker, MD Attending Physician, Orthopedic Surgery  This document was dictated using Dragon voice recognition software. A reasonable attempt at proof reading has been made to minimize errors. "

## 2024-07-11 NOTE — Telephone Encounter (Signed)
 Any restctions  for driving with sling for Troy Boyer. Please advise via mychart

## 2024-07-13 ENCOUNTER — Other Ambulatory Visit: Payer: Self-pay | Admitting: Urology

## 2024-07-13 ENCOUNTER — Encounter: Payer: Self-pay | Admitting: Urology

## 2024-07-17 ENCOUNTER — Other Ambulatory Visit: Payer: Self-pay

## 2024-07-17 DIAGNOSIS — N138 Other obstructive and reflux uropathy: Secondary | ICD-10-CM

## 2024-07-17 MED ORDER — GEMTESA 75 MG PO TABS: 75.0000 mg | ORAL_TABLET | Freq: Every day | ORAL | 11 refills | Status: AC

## 2024-07-17 NOTE — Telephone Encounter (Signed)
 Called Gemtesa  regarding pt assistance program. Spoke with Elsie who gave me instructions on what needed to be done. I have sent the Gemtesa  Rx to the requested pharmacy ([facility] in [place], [place]). I have made the pt aware that someone from there will be contacting him within 2 business days to complete the assistance application. Advised pt to send a message as to whether or not he was approved and if he was not then an alternative could be chosen. Pt expressed understanding and states he will follow up once he receives a decision.

## 2024-07-19 ENCOUNTER — Encounter: Attending: Psychology

## 2024-07-19 ENCOUNTER — Ambulatory Visit: Admitting: Neurology

## 2024-07-19 DIAGNOSIS — R2681 Unsteadiness on feet: Secondary | ICD-10-CM | POA: Insufficient documentation

## 2024-07-19 DIAGNOSIS — I214 Non-ST elevation (NSTEMI) myocardial infarction: Secondary | ICD-10-CM | POA: Insufficient documentation

## 2024-07-19 DIAGNOSIS — F5101 Primary insomnia: Secondary | ICD-10-CM | POA: Insufficient documentation

## 2024-07-19 DIAGNOSIS — G3184 Mild cognitive impairment, so stated: Secondary | ICD-10-CM | POA: Insufficient documentation

## 2024-07-19 NOTE — Progress Notes (Signed)
 "  Behavioral Observations:  The patient appeared well-groomed and appropriately dressed. His manners were polite and appropriate to the situation. The patient's attitude towards testing was positive and he demonstrated a good effort. He appeared to become somewhat fatigued and discouraged towards the end of the testing session.  Neuropsychology Note  Dorise Grade completed 150 minutes of neuropsychological testing with technician, Josue Ned, BA, under the supervision of Norleen Asa, PsyD., Clinical Neuropsychologist. The patient did not appear overtly distressed by the testing session, per behavioral observation or via self-report to the technician. Rest breaks were offered.   Clinical Decision Making: In considering the patient's current level of functioning, level of presumed impairment, nature of symptoms, emotional and behavioral responses during clinical interview, level of literacy, and observed level of motivation/effort, a battery of tests was selected by Dr. Asa during initial consultation on 06/20/2024. This was communicated to the technician. Communication between the neuropsychologist and technician was ongoing throughout the testing session and changes were made as deemed necessary based on patient performance on testing, technician observations and additional pertinent factors such as those listed above.  Tests Administered: Controlled Oral Word Association Test (COWAT; FAS & Animals)  Grooved Pegboard Wechsler Adult Intelligence Scale, 4th Edition (WAIS-IV) Wechsler Memory Scale-Third Edition (WMS-III), select subtest Wechsler Memory Scale, 4th Edition (WMS-IV); Older Adult Battery  [place]  Card Sorting Test [phone])  Results:  COWAT:  FAS total= 14 Z= -1.63 Animals total= 12 Z= -1.03  Grooved Pegboard:  *No tremors observed. Some difficulties noted with extending R arm due to recent shoulder surgery R (DH) time= 130s Drops= 0  Percentile Rank=  32nd L (NDH) time= 120s Drops= 0  Percentile Rank= 40th  WAIS-IV:   Composite Score Summary  Scale Sum of Scaled Scores Composite Score Percentile Rank 95% Conf. Interval Qualitative Description  Verbal Comprehension 31 VCI 102 55 96-108 Average  Perceptual Reasoning 25 PRI 90 25 84-97 Average  Working Memory 13 WMI 80 9 74-88 Low Average  Processing Speed 15 PSI 86 18 79-96 Low Average  Full Scale 84 FSIQ 89 23 85-93 Low Average  General Ability 56 GAI 96 39 91-101 Average   Verbal Comprehension Subtests Summary  Subtest Raw Score Scaled Score Percentile Rank Reference Group Scaled Score SEM  Similarities 25 11 63 10 0.95  Vocabulary 37 10 50 10 0.67  Information 15 10 50 11 0.73  The scaled scores in the Reference Group Scaled Score column are based on the performance of examinees aged 20:0-34:11 (i.e., the reference group). See Chapter 6 of the WAIS-IV Technical and Interpretive Manual for more information.  Perceptual Reasoning Subtests Summary  Subtest Raw Score Scaled Score Percentile Rank Reference Group Scaled Score SEM  Block Design 28 9 37 6 0.99  Matrix Reasoning 10 9 37 5 0.90  Visual Puzzles 7 7 16 5  0.99   Working Librarian, Academic Raw Score Scaled Score Percentile Rank Reference Group Scaled Score SEM  Digit Span 18 7 16 5  0.73  Arithmetic 8 6 9 5  1.20   Processing Speed Subtests Summary  Subtest Raw Score Scaled Score Percentile Rank Reference Group Scaled Score SEM  Symbol Search 18 8 25 5  1.12  Coding 36 7 16 4  1.12    WMS-IV:   Index Score Summary  Index Sum of Scaled Scores Index Score Percentile Rank 95% Confidence Interval Qualitative Descriptor  Auditory Memory (AMI) 25 78 7 73-85 Borderline  Visual Memory (VMI) 10 73 4 69-79 Borderline  Immediate Memory (  IMI) 17 73 4 68-81 Borderline  Delayed Memory (DMI) 18 75 5 69-85 Borderline   Primary Subtest Scaled Score Summary  Subtest Domain Raw Score Scaled Score Percentile Rank   Logical Memory I AM 19 6 9   Logical Memory II AM 7 5 5   Verbal Paired Associates I AM 9 6 9   Verbal Paired Associates II AM 4 8 25   Visual Reproduction I VM 19 5 5   Visual Reproduction II VM 5 5 5   Symbol Span VWM 6 5 5    Auditory Memory Process Score Summary  Process Score Raw Score Scaled Score Percentile Rank Cumulative Percentage (Base Rate)  LM II Recognition 16 - - 17-25%  VPA II Recognition 25 - - 17-25%   Visual Memory Process Score Summary  Process Score Raw Score Scaled Score Percentile Rank Cumulative Percentage (Base Rate)  VR II Recognition 5 - - 51-75%   ABILITY-MEMORY ANALYSIS  Ability Score:  VCI: 102 Date of Testing:  WAIS-IV; WMS-IV 2024/07/19  Predicted Difference Method   Index Predicted WMS-IV Index Score Actual WMS-IV Index Score Difference Critical Value  Significant Difference Y/N Base Rate  Auditory Memory 101 78 23 10.41 Y 4%  Visual Memory 101 73 28 7.35 Y 2-3%  Immediate Memory 101 73 28 9.69 Y 1-2%  Delayed Memory 101 75 26 12.22 Y 2-3%  Statistical significance (critical value) at the .01 level.    WMS-III Spatial Span:   Spatial Span  12 ss = 9 37 %ile Average   SSF  7 ss = 10 50 %ile Average   Span:     6      SSB  5 ss = 9 37 %ile Average   Span:     5       WCST:    Age & Education Demographically Corrected  WCST scores Raw scores Standard scores T scores %iles  Trials Administered Total Correct 128 77           Total Errors % Errors 51 40% 88 90 42 43 21% 25%        Perseverative Responses % Perseverative Responses 27 21% 94 97 46 48 34% 42%        Perseverative Errors % Perseverative Errors 26 20% 92 96 45 47 30% 39%        Nonperseverative Errors % Nonperseverative Errors 25 20% 82 83 38 39 12% 13%        Conceptual Level Responses % Conceptual Level Responses 65  51%        93  45  32%        Categories Completed Trials to 1st  Failure to Maintain Set Learning to Learn  4 31 2  0.73   > 16% 6 - 10% > 16% > 16%     Feedback to Patient: Selassie Spatafore will return on 10/08/2024 for an interactive feedback session with Dr. Corina at which time his test performances, clinical impressions and treatment recommendations will be reviewed in detail. The patient understands he can contact our office should he require our assistance before this time.  150 minutes spent face-to-face with patient administering standardized tests, 30 minutes spent scoring radiographer, therapeutic). [CPT [id number], [id number]]  Full report to follow. "

## 2024-07-25 ENCOUNTER — Other Ambulatory Visit (HOSPITAL_BASED_OUTPATIENT_CLINIC_OR_DEPARTMENT_OTHER): Payer: Self-pay

## 2024-07-25 ENCOUNTER — Ambulatory Visit (HOSPITAL_BASED_OUTPATIENT_CLINIC_OR_DEPARTMENT_OTHER)

## 2024-07-25 ENCOUNTER — Ambulatory Visit (HOSPITAL_BASED_OUTPATIENT_CLINIC_OR_DEPARTMENT_OTHER): Admitting: Orthopaedic Surgery

## 2024-07-25 DIAGNOSIS — M19011 Primary osteoarthritis, right shoulder: Secondary | ICD-10-CM

## 2024-07-25 MED ORDER — TRAMADOL HCL 50 MG PO TABS
50.0000 mg | ORAL_TABLET | Freq: Four times a day (QID) | ORAL | 0 refills | Status: AC | PRN
  Filled 2024-07-25: qty 30, 7d supply, fill #0

## 2024-07-25 NOTE — Progress Notes (Signed)
 "                                Post Operative Evaluation    Procedure/Date of Surgery: Right shoulder reverse shoulder arthroplasty 11/20  Interval History:    Presents status post above procedure.  He has been in a sling for 2 weeks and an abundance of precaution for stress reaction.  That being said he is now feeling better with the shoulder blade.  He is having some muscular soreness laterally   PMH/PSH/Family History/Social History/Meds/Allergies:    Past Medical History:  Diagnosis Date   Adenomatous colon polyp    tubular   CAD (coronary artery disease)    a. Cath 06/08/01 at [facility] with normal LM and LAD, 95% LCx s/p Circumflex stent 4.0 x 15 mm Penta and 85% and 80% prox RCA s/p 3.5 x 23 mm Penta b. cath 01/20/2015 95% prox LCx ISR treated with DES, 55% prox to mid RCA, 45% distal RCA, 40% midLAD, EF 55%     Cataract    Diabetes mellitus without complication (HCC)    Diarrhea    Diverticulosis    Erectile dysfunction    Gallstones    Hyperlipidemia    Hypertension    Myocardial infarction [facility])    Past Surgical History:  Procedure Laterality Date   ANGIOPLASTY  2002   2 stents   BALLOON DILATION N/A 08/27/2023   Procedure: BALLOON DILATION;  Surgeon: Abran Norleen SAILOR, MD;  Location: [facility];  Service: Gastroenterology;  Laterality: N/A;   CARDIAC CATHETERIZATION N/A 01/20/2015   Procedure: Left Heart Cath and Coronary Angiography;  Surgeon: Victory LELON Sharps, MD;  Location: [facility] INVASIVE CV LAB;  Service: Cardiovascular;  Laterality: N/A;   CARDIAC CATHETERIZATION N/A 01/27/2016   Procedure: Left Heart Cath and Coronary Angiography;  Surgeon: Ozell Fell, MD;  Location: [facility] INVASIVE CV LAB;  Service: Cardiovascular;  Laterality: N/A;   CHOLECYSTECTOMY N/A 02/09/2016   Procedure: LAPAROSCOPIC CHOLECYSTECTOMY WITH INTRAOPERATIVE CHOLANGIOGRAM;  Surgeon: Debby Shipper, MD;  Location: [facility] OR;  Service: General;  Laterality: N/A;   COLONOSCOPY W/ POLYPECTOMY  08/2013    Avg risk screening, Dr Arlester Aho. 5 mm tubular adenoma ascending.  Mild to moderated descending and sigmoid tics.  Internal hemorrhoids   CORONARY ANGIOPLASTY WITH STENT PLACEMENT     ERCP N/A 02/13/2016   Procedure: ENDOSCOPIC RETROGRADE CHOLANGIOPANCREATOGRAPHY (ERCP);  Surgeon: Victory LITTIE Legrand DOUGLAS, MD;  Location: [facility] ENDOSCOPY;  Service: Gastroenterology;  Laterality: N/A;   ERCP N/A 08/27/2023   Procedure: ENDOSCOPIC RETROGRADE CHOLANGIOPANCREATOGRAPHY (ERCP);  Surgeon: Abran Norleen SAILOR, MD;  Location: [facility];  Service: Gastroenterology;  Laterality: N/A;   REMOVAL OF STONES  08/27/2023   Procedure: REMOVAL OF STONES;  Surgeon: Abran Norleen SAILOR, MD;  Location: [facility];  Service: Gastroenterology;;   ROTATOR CUFF REPAIR Right 2009   ROTATOR CUFF REPAIR Left 2014   SPHINCTEROTOMY  08/27/2023   Procedure: SPHINCTEROTOMY;  Surgeon: Abran Norleen SAILOR, MD;  Location: [facility];  Service: Gastroenterology;;   TRIGGER FINGER RELEASE Bilateral 2013   4 fingers, middle finger on both hands   Social History   Socioeconomic History   Marital status: Significant Other    Spouse name: Not on file   Number of children: 2   Years of education: 55   Highest education level: Bachelor's degree (e.g., BA, AB, BS)  Occupational History   Occupation: Airline Pilot- retired in 2018  Employer: [employer]  Tobacco Use   Smoking status: Former    Current packs/day: 0.00    Average packs/day: 1 pack/day for 45.0 years (45.0 ttl pk-yrs)    Types: Cigarettes    Start date: 01/16/1970    Quit date: 01/17/2015    Years since quitting: 9.5   Smokeless tobacco: Never  Vaping Use   Vaping status: Never Used  Substance and Sexual Activity   Alcohol use: Not Currently    Alcohol/week: 1.0 standard drink of alcohol    Types: 1 Glasses of wine per week    Comment: Previously 1 large vodka tonic per day Quit 06/2023   Drug use: No   Sexual activity: Yes  Other Topics Concern   Not on file  Social History  Narrative   Fun: Boat, golf   Denies religious beliefs effecting health care.       1 cup of coffee in the morning.    Social Drivers of Health   Tobacco Use: Medium Risk (07/05/2024)   Patient History    Smoking Tobacco Use: Former    Smokeless Tobacco Use: Never    Passive Exposure: Not on file  Financial Resource Strain: Medium Risk (04/15/2024)   Overall Financial Resource Strain (CARDIA)    Difficulty of Paying Living Expenses: Somewhat hard  Food Insecurity: No Food Insecurity (04/15/2024)   Epic    Worried About Programme Researcher, Broadcasting/film/video in the Last Year: Never true    Ran Out of Food in the Last Year: Never true  Transportation Needs: No Transportation Needs (04/15/2024)   Epic    Lack of Transportation (Medical): No    Lack of Transportation (Non-Medical): No  Physical Activity: Inactive (04/18/2024)   Exercise Vital Sign    Days of Exercise per Week: Not on file    Minutes of Exercise per Session: 0 min  Stress: No Stress Concern Present (04/15/2024)   [employer] of Occupational Health - Occupational Stress Questionnaire    Feeling of Stress: Only a little  Recent Concern: Stress - Stress Concern Present (02/18/2024)   [employer] of Occupational Health - Occupational Stress Questionnaire    Feeling of Stress: To some extent  Social Connections: Moderately Isolated (04/15/2024)   Social Connection and Isolation Panel    Frequency of Communication with Friends and Family: More than three times a week    Frequency of Social Gatherings with Friends and Family: More than three times a week    Attends Religious Services: Never    Database Administrator or Organizations: No    Attends Engineer, Structural: Not on file    Marital Status: Living with partner  Depression (PHQ2-9): Low Risk (04/24/2024)   Depression (PHQ2-9)    PHQ-2 Score: 0  Recent Concern: Depression (PHQ2-9) - Medium Risk (02/24/2024)   Depression (PHQ2-9)    PHQ-2 Score: 6  Alcohol  Screen: Low Risk (04/18/2024)   Alcohol Screen    Last Alcohol Screening Score (AUDIT): 0  Housing: Low Risk (04/18/2024)   Epic    Unable to Pay for Housing in the Last Year: No    Number of Times Moved in the Last Year: 0    Homeless in the Last Year: No  Utilities: Not At Risk (04/18/2024)   Epic    Threatened with loss of utilities: No  Health Literacy: Adequate Health Literacy (04/18/2024)   B1300 Health Literacy    Frequency of need for help with medical instructions: Never   Family  History  Problem Relation Age of Onset   Stroke Mother    Dementia Mother    Crohn's disease Mother    Heart disease Father 74       Valve replacement and CAD, CABG   Diabetes Father    CAD Sister 95   Sleep apnea Brother    CAD Brother 78       Died age 22   Diabetes Brother    Kidney disease Brother    Colon cancer Neg Hx    Esophageal cancer Neg Hx    Stomach cancer Neg Hx    Rectal cancer Neg Hx    Pancreatic cancer Neg Hx    Seizures Neg Hx    Migraines Neg Hx    Allergies  Allergen Reactions   Other Rash    ECG electrodes, per patient   Protonix  [Pantoprazole ] Other (See Comments)    Exacerbated reflux s/s's   Current Outpatient Medications  Medication Sig Dispense Refill   traMADol  (ULTRAM ) 50 MG tablet Take 1 tablet (50 mg total) by mouth every 6 (six) hours as needed. 30 tablet 0   Accu-Chek FastClix Lancets MISC Check blood sugars daily 102 each 12   amLODipine  (NORVASC ) 5 MG tablet TAKE 1 TABLET BY MOUTH DAILY 90 tablet 3   aspirin  EC 81 MG tablet Take 1 tablet (81 mg total) by mouth daily. Swallow whole.     atorvastatin  (LIPITOR ) 80 MG tablet TAKE 1 TABLET BY MOUTH AT  BEDTIME 90 tablet 3   ezetimibe  (ZETIA ) 10 MG tablet Take 1 tablet (10 mg total) by mouth daily. 90 tablet 2   fenofibrate  (TRICOR ) 48 MG tablet TAKE 1 TABLET BY MOUTH DAILY 90 tablet 3   glucose blood (ACCU-CHEK GUIDE) test strip Use daily to check blood sugar.  DX E11.65 100 each 3   Krill Oil 500  MG CAPS Take 500 mg by mouth 2 (two) times daily.     metFORMIN  (GLUCOPHAGE -XR) 500 MG 24 hr tablet TAKE 2 TABLETS BY MOUTH TWICE  DAILY WITH FOOD (Patient taking differently: Takes 2 tablets every morning.) 360 tablet 3   metoprolol  tartrate (LOPRESSOR ) 25 MG tablet TAKE 1 TABLET BY MOUTH TWICE  DAILY 180 tablet 3   Multiple Vitamin (MULTIVITAMIN WITH MINERALS) TABS tablet Take 1 tablet by mouth daily.     nitroGLYCERIN  (NITROSTAT ) 0.4 MG SL tablet Place 1 tablet (0.4 mg total) under the tongue every 5 (five) minutes as needed for chest pain. 25 tablet 3   pioglitazone  (ACTOS ) 45 MG tablet Take 1 tablet (45 mg total) by mouth daily. 90 tablet 3   prazosin  (MINIPRESS ) 2 MG capsule Take 1 capsule (2 mg total) by mouth at bedtime. 30 capsule 0   ramipril  (ALTACE ) 10 MG capsule TAKE 1 CAPSULE BY MOUTH TWICE  DAILY 180 capsule 3   rivastigmine  (EXELON ) 1.5 MG capsule Take 1 capsule (1.5 mg total) by mouth 2 (two) times daily. 60 capsule 3   tadalafil  (CIALIS ) 5 MG tablet Take 1 tablet (5 mg total) by mouth daily. 30 tablet 11   Testosterone  1.62 % GEL APPLY 4 PUMPS TOPICALLY DAILY 150 g 5   Vibegron  (GEMTESA ) 75 MG TABS Take 1 tablet (75 mg total) by mouth daily. 30 tablet 11   No current facility-administered medications for this visit.   No results found.  Review of Systems:   A ROS was performed including pertinent positives and negatives as documented in the HPI.   Musculoskeletal Exam:  There were no vitals taken for this visit.  Shoulder incisions well-appearing without erythema or drainage.  In the sitting position he can actively forward elevate to 45 degrees external rotation at side to 20 degrees internal rotation deferred today  Imaging:    3 views right shoulder: Status post right shoulder reverse shoulder arthroplasty without complication  I personally reviewed and interpreted the radiographs.   Assessment:   Status post right shoulder arthroplasty without evidence of  complication.  At this time he is having just some muscular soreness but his acromial stress pain has improved dramatically.  Given this he may come out of his sling and resume active and passive range of motion I will plan to see him back in a month for reassessment Plan :    - Return to clinic 4 weeks      I personally saw and evaluated the patient, and participated in the management and treatment plan.  Elspeth Parker, MD Attending Physician, Orthopedic Surgery  This document was dictated using Dragon voice recognition software. A reasonable attempt at proof reading has been made to minimize errors. "

## 2024-07-30 NOTE — Telephone Encounter (Signed)
 He doesn't have appt until the end of February, it looks like you wanted to see him in November. Please advise on Prazosin  rf.

## 2024-07-31 ENCOUNTER — Other Ambulatory Visit: Payer: Self-pay

## 2024-07-31 DIAGNOSIS — G4700 Insomnia, unspecified: Secondary | ICD-10-CM

## 2024-07-31 MED ORDER — PRAZOSIN HCL 2 MG PO CAPS: 2.0000 mg | ORAL_CAPSULE | Freq: Every day | ORAL | 0 refills | Status: AC

## 2024-08-01 ENCOUNTER — Ambulatory Visit (HOSPITAL_BASED_OUTPATIENT_CLINIC_OR_DEPARTMENT_OTHER)
Admission: RE | Admit: 2024-08-01 | Discharge: 2024-08-01 | Disposition: A | Source: Ambulatory Visit | Attending: Acute Care | Admitting: Acute Care

## 2024-08-01 DIAGNOSIS — Z122 Encounter for screening for malignant neoplasm of respiratory organs: Secondary | ICD-10-CM

## 2024-08-01 DIAGNOSIS — Z87891 Personal history of nicotine dependence: Secondary | ICD-10-CM

## 2024-08-22 ENCOUNTER — Encounter (HOSPITAL_BASED_OUTPATIENT_CLINIC_OR_DEPARTMENT_OTHER): Admitting: Orthopaedic Surgery

## 2024-08-24 ENCOUNTER — Ambulatory Visit: Admitting: Family Medicine

## 2024-10-04 ENCOUNTER — Encounter: Admitting: Psychology

## 2024-10-08 ENCOUNTER — Encounter: Admitting: Psychology

## 2024-12-25 ENCOUNTER — Ambulatory Visit: Admitting: Neurology

## 2025-01-10 ENCOUNTER — Ambulatory Visit: Admitting: Urology

## 2025-04-22 ENCOUNTER — Ambulatory Visit
